# Patient Record
Sex: Male | Born: 1950
Health system: Southern US, Community
[De-identification: ages and names within clinical notes are randomized; demographics above are authoritative.]

## PROBLEM LIST (undated history)

## (undated) DIAGNOSIS — Z9889 Other specified postprocedural states: Secondary | ICD-10-CM

## (undated) DIAGNOSIS — F99 Mental disorder, not otherwise specified: Secondary | ICD-10-CM

## (undated) DIAGNOSIS — H409 Unspecified glaucoma: Secondary | ICD-10-CM

## (undated) DIAGNOSIS — N183 Chronic kidney disease, stage 3 unspecified: Secondary | ICD-10-CM

## (undated) DIAGNOSIS — Z8701 Personal history of pneumonia (recurrent): Secondary | ICD-10-CM

## (undated) DIAGNOSIS — I428 Other cardiomyopathies: Secondary | ICD-10-CM

## (undated) DIAGNOSIS — K219 Gastro-esophageal reflux disease without esophagitis: Secondary | ICD-10-CM

## (undated) DIAGNOSIS — Z9289 Personal history of other medical treatment: Secondary | ICD-10-CM

## (undated) DIAGNOSIS — M545 Low back pain, unspecified: Secondary | ICD-10-CM

## (undated) DIAGNOSIS — F32A Depression, unspecified: Secondary | ICD-10-CM

## (undated) DIAGNOSIS — G8929 Other chronic pain: Secondary | ICD-10-CM

## (undated) DIAGNOSIS — I10 Essential (primary) hypertension: Secondary | ICD-10-CM

## (undated) DIAGNOSIS — K922 Gastrointestinal hemorrhage, unspecified: Secondary | ICD-10-CM

## (undated) DIAGNOSIS — M1990 Unspecified osteoarthritis, unspecified site: Secondary | ICD-10-CM

## (undated) DIAGNOSIS — F1910 Other psychoactive substance abuse, uncomplicated: Secondary | ICD-10-CM

## (undated) DIAGNOSIS — F329 Major depressive disorder, single episode, unspecified: Secondary | ICD-10-CM

## (undated) DIAGNOSIS — I5042 Chronic combined systolic (congestive) and diastolic (congestive) heart failure: Secondary | ICD-10-CM

## (undated) DIAGNOSIS — I5023 Acute on chronic systolic (congestive) heart failure: Secondary | ICD-10-CM

## (undated) HISTORY — DX: Gastro-esophageal reflux disease without esophagitis: K21.9

## (undated) HISTORY — PX: FRACTURE SURGERY: SHX138

## (undated) HISTORY — DX: Essential (primary) hypertension: I10

## (undated) HISTORY — PX: GLAUCOMA SURGERY: SHX656

## (undated) HISTORY — PX: EYE SURGERY: SHX253

---

## 1997-03-13 DIAGNOSIS — Z9289 Personal history of other medical treatment: Secondary | ICD-10-CM

## 1997-03-13 HISTORY — PX: HIP FRACTURE SURGERY: SHX118

## 1997-03-13 HISTORY — DX: Personal history of other medical treatment: Z92.89

## 1997-03-13 HISTORY — PX: TIBIA FRACTURE SURGERY: SHX806

## 2004-11-09 ENCOUNTER — Ambulatory Visit (HOSPITAL_COMMUNITY): Admission: RE | Admit: 2004-11-09 | Discharge: 2004-11-09 | Payer: Self-pay | Admitting: Urology

## 2004-11-11 ENCOUNTER — Ambulatory Visit (HOSPITAL_COMMUNITY): Admission: RE | Admit: 2004-11-11 | Discharge: 2004-11-11 | Payer: Self-pay | Admitting: Urology

## 2009-03-13 HISTORY — PX: FEMUR FRACTURE SURGERY: SHX633

## 2010-03-13 DIAGNOSIS — H409 Unspecified glaucoma: Secondary | ICD-10-CM

## 2010-03-13 HISTORY — DX: Unspecified glaucoma: H40.9

## 2010-03-15 ENCOUNTER — Inpatient Hospital Stay (HOSPITAL_COMMUNITY): Admission: EM | Admit: 2010-03-15 | Discharge: 2010-03-22 | Payer: Self-pay | Source: Home / Self Care

## 2010-03-16 LAB — LIPID PANEL
Cholesterol: 138 mg/dL (ref 0–200)
HDL: 42 mg/dL (ref >39)
LDL Cholesterol: 71 mg/dL (ref 0–99)
Total CHOL/HDL Ratio: 3.3 RATIO
Triglycerides: 125 mg/dL (ref <150)
VLDL: 25 mg/dL (ref 0–40)

## 2010-03-16 LAB — COMPREHENSIVE METABOLIC PANEL
ALT: 59 U/L — ABNORMAL HIGH (ref 0–53)
AST: 87 U/L — ABNORMAL HIGH (ref 0–37)
Albumin: 3 g/dL — ABNORMAL LOW (ref 3.5–5.2)
Alkaline Phosphatase: 90 U/L (ref 39–117)
BUN: 9 mg/dL (ref 6–23)
CO2: 23 mEq/L (ref 19–32)
Calcium: 8.5 mg/dL (ref 8.4–10.5)
Chloride: 105 mEq/L (ref 96–112)
Creatinine, Ser: 1.52 mg/dL — ABNORMAL HIGH (ref 0.4–1.5)
GFR calc Af Amer: 57 mL/min — ABNORMAL LOW (ref >60)
GFR calc non Af Amer: 47 mL/min — ABNORMAL LOW (ref >60)
Glucose, Bld: 134 mg/dL — ABNORMAL HIGH (ref 70–99)
Potassium: 3.6 mEq/L (ref 3.5–5.1)
Sodium: 138 mEq/L (ref 135–145)
Total Bilirubin: 0.4 mg/dL (ref 0.3–1.2)
Total Protein: 5.7 g/dL — ABNORMAL LOW (ref 6.0–8.3)

## 2010-03-16 LAB — CARDIAC PANEL(CRET KIN+CKTOT+MB+TROPI)
CK, MB: 5.5 ng/mL — ABNORMAL HIGH (ref 0.3–4.0)
CK, MB: 5.4 ng/mL — ABNORMAL HIGH (ref 0.3–4.0)
Relative Index: 1.7 (ref 0.0–2.5)
Relative Index: 2 (ref 0.0–2.5)
Total CK: 330 U/L — ABNORMAL HIGH (ref 7–232)
Total CK: 273 U/L — ABNORMAL HIGH (ref 7–232)
Troponin I: 0.03 ng/mL (ref 0.00–0.06)
Troponin I: 0.04 ng/mL (ref 0.00–0.06)

## 2010-03-16 LAB — TSH: TSH: 3.129 u[IU]/mL (ref 0.350–4.500)

## 2010-03-16 LAB — BRAIN NATRIURETIC PEPTIDE: Pro B Natriuretic peptide (BNP): 1010 pg/mL — ABNORMAL HIGH (ref 0.0–100.0)

## 2010-03-17 ENCOUNTER — Other Ambulatory Visit: Payer: Self-pay | Admitting: Internal Medicine

## 2010-03-17 LAB — COMPREHENSIVE METABOLIC PANEL
ALT: 55 U/L — ABNORMAL HIGH (ref 0–53)
AST: 39 U/L — ABNORMAL HIGH (ref 0–37)
Albumin: 3.3 g/dL — ABNORMAL LOW (ref 3.5–5.2)
Alkaline Phosphatase: 88 U/L (ref 39–117)
BUN: 12 mg/dL (ref 6–23)
CO2: 27 mEq/L (ref 19–32)
Calcium: 9.3 mg/dL (ref 8.4–10.5)
Chloride: 103 mEq/L (ref 96–112)
Creatinine, Ser: 1.58 mg/dL — ABNORMAL HIGH (ref 0.4–1.5)
GFR calc Af Amer: 54 mL/min — ABNORMAL LOW (ref >60)
GFR calc non Af Amer: 45 mL/min — ABNORMAL LOW (ref >60)
Glucose, Bld: 83 mg/dL (ref 70–99)
Potassium: 4 mEq/L (ref 3.5–5.1)
Sodium: 139 mEq/L (ref 135–145)
Total Bilirubin: 0.5 mg/dL (ref 0.3–1.2)
Total Protein: 6 g/dL (ref 6.0–8.3)

## 2010-03-17 LAB — BRAIN NATRIURETIC PEPTIDE: Pro B Natriuretic peptide (BNP): 981 pg/mL — ABNORMAL HIGH (ref 0.0–100.0)

## 2010-03-18 LAB — BASIC METABOLIC PANEL
BUN: 18 mg/dL (ref 6–23)
CO2: 26 mEq/L (ref 19–32)
Calcium: 9.7 mg/dL (ref 8.4–10.5)
Chloride: 105 mEq/L (ref 96–112)
Creatinine, Ser: 1.47 mg/dL (ref 0.4–1.5)
GFR calc Af Amer: 59 mL/min — ABNORMAL LOW (ref >60)
GFR calc non Af Amer: 49 mL/min — ABNORMAL LOW (ref >60)
Glucose, Bld: 84 mg/dL (ref 70–99)
Potassium: 4 mEq/L (ref 3.5–5.1)
Sodium: 140 mEq/L (ref 135–145)

## 2010-03-18 LAB — CBC
HCT: 50.9 % (ref 39.0–52.0)
Hemoglobin: 17.6 g/dL — ABNORMAL HIGH (ref 13.0–17.0)
MCH: 32.1 pg (ref 26.0–34.0)
MCHC: 34.6 g/dL (ref 30.0–36.0)
MCV: 92.9 fL (ref 78.0–100.0)
Platelets: 220 10*3/uL (ref 150–400)
RBC: 5.48 MIL/uL (ref 4.22–5.81)
RDW: 14.9 % (ref 11.5–15.5)
WBC: 6.5 10*3/uL (ref 4.0–10.5)

## 2010-03-18 LAB — DIFFERENTIAL
Basophils Absolute: 0 10*3/uL (ref 0.0–0.1)
Basophils Relative: 1 % (ref 0–1)
Eosinophils Absolute: 0.2 10*3/uL (ref 0.0–0.7)
Eosinophils Relative: 3 % (ref 0–5)
Lymphocytes Relative: 32 % (ref 12–46)
Lymphs Abs: 2.1 10*3/uL (ref 0.7–4.0)
Monocytes Absolute: 0.7 10*3/uL (ref 0.1–1.0)
Monocytes Relative: 11 % (ref 3–12)
Neutro Abs: 3.4 10*3/uL (ref 1.7–7.7)
Neutrophils Relative %: 53 % (ref 43–77)

## 2010-03-18 LAB — BRAIN NATRIURETIC PEPTIDE: Pro B Natriuretic peptide (BNP): 653 pg/mL — ABNORMAL HIGH (ref 0.0–100.0)

## 2010-03-28 LAB — URINALYSIS, ROUTINE W REFLEX MICROSCOPIC
Bilirubin Urine: NEGATIVE
Hgb urine dipstick: NEGATIVE
Ketones, ur: NEGATIVE mg/dL
Nitrite: NEGATIVE
Protein, ur: NEGATIVE mg/dL
Specific Gravity, Urine: 1.02 (ref 1.005–1.030)
Urine Glucose, Fasting: NEGATIVE mg/dL
Urobilinogen, UA: 0.2 mg/dL (ref 0.0–1.0)
pH: 5 (ref 5.0–8.0)

## 2010-03-28 LAB — DIFFERENTIAL
Basophils Absolute: 0 10*3/uL (ref 0.0–0.1)
Basophils Absolute: 0 10*3/uL (ref 0.0–0.1)
Basophils Absolute: 0 10*3/uL (ref 0.0–0.1)
Basophils Relative: 0 % (ref 0–1)
Basophils Relative: 1 % (ref 0–1)
Basophils Relative: 1 % (ref 0–1)
Eosinophils Absolute: 0.2 10*3/uL (ref 0.0–0.7)
Eosinophils Absolute: 0.2 10*3/uL (ref 0.0–0.7)
Eosinophils Absolute: 0.2 10*3/uL (ref 0.0–0.7)
Eosinophils Relative: 3 % (ref 0–5)
Eosinophils Relative: 2 % (ref 0–5)
Eosinophils Relative: 3 % (ref 0–5)
Lymphocytes Relative: 35 % (ref 12–46)
Lymphocytes Relative: 36 % (ref 12–46)
Lymphocytes Relative: 37 % (ref 12–46)
Lymphs Abs: 2.3 10*3/uL (ref 0.7–4.0)
Lymphs Abs: 2.4 10*3/uL (ref 0.7–4.0)
Lymphs Abs: 2.2 10*3/uL (ref 0.7–4.0)
Monocytes Absolute: 0.6 10*3/uL (ref 0.1–1.0)
Monocytes Absolute: 0.8 10*3/uL (ref 0.1–1.0)
Monocytes Absolute: 0.9 10*3/uL (ref 0.1–1.0)
Monocytes Relative: 9 % (ref 3–12)
Monocytes Relative: 12 % (ref 3–12)
Monocytes Relative: 15 % — ABNORMAL HIGH (ref 3–12)
Neutro Abs: 3.5 10*3/uL (ref 1.7–7.7)
Neutro Abs: 3.2 10*3/uL (ref 1.7–7.7)
Neutro Abs: 2.6 10*3/uL (ref 1.7–7.7)
Neutrophils Relative %: 53 % (ref 43–77)
Neutrophils Relative %: 49 % (ref 43–77)
Neutrophils Relative %: 45 % (ref 43–77)

## 2010-03-28 LAB — COMPREHENSIVE METABOLIC PANEL
ALT: 73 U/L — ABNORMAL HIGH (ref 0–53)
AST: 61 U/L — ABNORMAL HIGH (ref 0–37)
Albumin: 3.3 g/dL — ABNORMAL LOW (ref 3.5–5.2)
Alkaline Phosphatase: 84 U/L (ref 39–117)
BUN: 25 mg/dL — ABNORMAL HIGH (ref 6–23)
CO2: 22 mEq/L (ref 19–32)
Calcium: 9.2 mg/dL (ref 8.4–10.5)
Chloride: 109 mEq/L (ref 96–112)
Creatinine, Ser: 1.35 mg/dL (ref 0.4–1.5)
GFR calc Af Amer: >60 mL/min (ref >60)
GFR calc non Af Amer: 54 mL/min — ABNORMAL LOW (ref >60)
Glucose, Bld: 97 mg/dL (ref 70–99)
Potassium: 4.2 mEq/L (ref 3.5–5.1)
Sodium: 138 mEq/L (ref 135–145)
Total Bilirubin: 0.2 mg/dL — ABNORMAL LOW (ref 0.3–1.2)
Total Protein: 6.3 g/dL (ref 6.0–8.3)

## 2010-03-28 LAB — CBC
HCT: 49 % (ref 39.0–52.0)
HCT: 49.7 % (ref 39.0–52.0)
HCT: 47.1 % (ref 39.0–52.0)
Hemoglobin: 16.4 g/dL (ref 13.0–17.0)
Hemoglobin: 16.9 g/dL (ref 13.0–17.0)
Hemoglobin: 15.7 g/dL (ref 13.0–17.0)
MCH: 31.5 pg (ref 26.0–34.0)
MCH: 31.8 pg (ref 26.0–34.0)
MCH: 31.4 pg (ref 26.0–34.0)
MCHC: 33.5 g/dL (ref 30.0–36.0)
MCHC: 34 g/dL (ref 30.0–36.0)
MCHC: 33.3 g/dL (ref 30.0–36.0)
MCV: 94.2 fL (ref 78.0–100.0)
MCV: 93.4 fL (ref 78.0–100.0)
MCV: 94.2 fL (ref 78.0–100.0)
Platelets: 202 10*3/uL (ref 150–400)
Platelets: 180 10*3/uL (ref 150–400)
Platelets: 192 10*3/uL (ref 150–400)
RBC: 5.2 MIL/uL (ref 4.22–5.81)
RBC: 5.32 MIL/uL (ref 4.22–5.81)
RBC: 5 MIL/uL (ref 4.22–5.81)
RDW: 14.7 % (ref 11.5–15.5)
RDW: 14.8 % (ref 11.5–15.5)
RDW: 14.8 % (ref 11.5–15.5)
WBC: 6.6 10*3/uL (ref 4.0–10.5)
WBC: 6.6 10*3/uL (ref 4.0–10.5)
WBC: 5.8 10*3/uL (ref 4.0–10.5)

## 2010-03-28 LAB — BASIC METABOLIC PANEL
BUN: 23 mg/dL (ref 6–23)
BUN: 31 mg/dL — ABNORMAL HIGH (ref 6–23)
BUN: 22 mg/dL (ref 6–23)
CO2: 28 mEq/L (ref 19–32)
CO2: 26 mEq/L (ref 19–32)
CO2: 25 mEq/L (ref 19–32)
Calcium: 9.9 mg/dL (ref 8.4–10.5)
Calcium: 9.6 mg/dL (ref 8.4–10.5)
Calcium: 8.7 mg/dL (ref 8.4–10.5)
Chloride: 97 mEq/L (ref 96–112)
Chloride: 104 mEq/L (ref 96–112)
Chloride: 107 mEq/L (ref 96–112)
Creatinine, Ser: 1.88 mg/dL — ABNORMAL HIGH (ref 0.4–1.5)
Creatinine, Ser: 1.93 mg/dL — ABNORMAL HIGH (ref 0.4–1.5)
Creatinine, Ser: 1.46 mg/dL (ref 0.4–1.5)
GFR calc Af Amer: 45 mL/min — ABNORMAL LOW (ref >60)
GFR calc Af Amer: 43 mL/min — ABNORMAL LOW (ref >60)
GFR calc Af Amer: 60 mL/min — ABNORMAL LOW (ref >60)
GFR calc non Af Amer: 37 mL/min — ABNORMAL LOW (ref >60)
GFR calc non Af Amer: 36 mL/min — ABNORMAL LOW (ref >60)
GFR calc non Af Amer: 49 mL/min — ABNORMAL LOW (ref >60)
Glucose, Bld: 98 mg/dL (ref 70–99)
Glucose, Bld: 115 mg/dL — ABNORMAL HIGH (ref 70–99)
Glucose, Bld: 87 mg/dL (ref 70–99)
Potassium: 4.3 mEq/L (ref 3.5–5.1)
Potassium: 4.4 mEq/L (ref 3.5–5.1)
Potassium: 4.2 mEq/L (ref 3.5–5.1)
Sodium: 135 mEq/L (ref 135–145)
Sodium: 138 mEq/L (ref 135–145)
Sodium: 137 mEq/L (ref 135–145)

## 2010-03-28 LAB — MAGNESIUM: Magnesium: 2.4 mg/dL (ref 1.5–2.5)

## 2010-03-28 LAB — URINE CULTURE
Colony Count: NO GROWTH
Culture  Setup Time: 201201082212
Culture: NO GROWTH
Special Requests: NEGATIVE

## 2010-03-28 LAB — CREATININE, URINE, RANDOM: Creatinine, Urine: 60.87 mg/dL

## 2010-03-28 LAB — BRAIN NATRIURETIC PEPTIDE
Pro B Natriuretic peptide (BNP): 352 pg/mL — ABNORMAL HIGH (ref 0.0–100.0)
Pro B Natriuretic peptide (BNP): 500 pg/mL — ABNORMAL HIGH (ref 0.0–100.0)

## 2010-03-28 LAB — SODIUM, URINE, RANDOM: Sodium, Ur: 71 mEq/L
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
## 2010-04-30 ENCOUNTER — Inpatient Hospital Stay (HOSPITAL_COMMUNITY)
Admission: EM | Admit: 2010-04-30 | Discharge: 2010-05-04 | DRG: 287 | Disposition: A | Discharge status: Home / Self Care | Payer: Medicare Other | Attending: Internal Medicine | Admitting: Internal Medicine

## 2010-04-30 ENCOUNTER — Emergency Department (HOSPITAL_COMMUNITY): Payer: Medicare Other

## 2010-04-30 ENCOUNTER — Encounter: Payer: Self-pay | Admitting: Cardiology

## 2010-04-30 DIAGNOSIS — F1910 Other psychoactive substance abuse, uncomplicated: Secondary | ICD-10-CM | POA: Diagnosis present

## 2010-04-30 DIAGNOSIS — I509 Heart failure, unspecified: Secondary | ICD-10-CM | POA: Diagnosis present

## 2010-04-30 DIAGNOSIS — I129 Hypertensive chronic kidney disease with stage 1 through stage 4 chronic kidney disease, or unspecified chronic kidney disease: Secondary | ICD-10-CM | POA: Diagnosis present

## 2010-04-30 DIAGNOSIS — N179 Acute kidney failure, unspecified: Secondary | ICD-10-CM | POA: Diagnosis present

## 2010-04-30 DIAGNOSIS — N189 Chronic kidney disease, unspecified: Secondary | ICD-10-CM | POA: Diagnosis present

## 2010-04-30 DIAGNOSIS — E876 Hypokalemia: Secondary | ICD-10-CM | POA: Diagnosis not present

## 2010-04-30 DIAGNOSIS — R7402 Elevation of levels of lactic acid dehydrogenase (LDH): Secondary | ICD-10-CM | POA: Diagnosis present

## 2010-04-30 DIAGNOSIS — R7401 Elevation of levels of liver transaminase levels: Secondary | ICD-10-CM | POA: Diagnosis present

## 2010-04-30 DIAGNOSIS — I5041 Acute combined systolic (congestive) and diastolic (congestive) heart failure: Principal | ICD-10-CM | POA: Diagnosis present

## 2010-04-30 DIAGNOSIS — I498 Other specified cardiac arrhythmias: Secondary | ICD-10-CM | POA: Diagnosis present

## 2010-04-30 DIAGNOSIS — Z9119 Patient's noncompliance with other medical treatment and regimen: Secondary | ICD-10-CM

## 2010-04-30 DIAGNOSIS — Z91199 Patient's noncompliance with other medical treatment and regimen due to unspecified reason: Secondary | ICD-10-CM

## 2010-04-30 DIAGNOSIS — I428 Other cardiomyopathies: Secondary | ICD-10-CM | POA: Diagnosis present

## 2010-04-30 DIAGNOSIS — F411 Generalized anxiety disorder: Secondary | ICD-10-CM | POA: Diagnosis present

## 2010-04-30 DIAGNOSIS — Z7982 Long term (current) use of aspirin: Secondary | ICD-10-CM

## 2010-04-30 LAB — DIFFERENTIAL
Basophils Absolute: 0 10*3/uL (ref 0.0–0.1)
Basophils Relative: 0 % (ref 0–1)
Eosinophils Absolute: 0.1 10*3/uL (ref 0.0–0.7)
Eosinophils Relative: 1 % (ref 0–5)
Lymphocytes Relative: 31 % (ref 12–46)
Lymphs Abs: 2.2 10*3/uL (ref 0.7–4.0)
Monocytes Absolute: 0.8 10*3/uL (ref 0.1–1.0)
Monocytes Relative: 12 % (ref 3–12)
Neutro Abs: 4 10*3/uL (ref 1.7–7.7)
Neutrophils Relative %: 56 % (ref 43–77)

## 2010-04-30 LAB — URINALYSIS, ROUTINE W REFLEX MICROSCOPIC
Bilirubin Urine: NEGATIVE
Ketones, ur: NEGATIVE mg/dL
Nitrite: POSITIVE — AB
Protein, ur: 30 mg/dL — AB
Specific Gravity, Urine: 1.025 (ref 1.005–1.030)
Urine Glucose, Fasting: NEGATIVE mg/dL
Urobilinogen, UA: 0.2 mg/dL (ref 0.0–1.0)
pH: 5.5 (ref 5.0–8.0)

## 2010-04-30 LAB — BASIC METABOLIC PANEL
BUN: 13 mg/dL (ref 6–23)
CO2: 23 mEq/L (ref 19–32)
Calcium: 9.2 mg/dL (ref 8.4–10.5)
Chloride: 108 mEq/L (ref 96–112)
Creatinine, Ser: 1.59 mg/dL — ABNORMAL HIGH (ref 0.4–1.5)
GFR calc Af Amer: 54 mL/min — ABNORMAL LOW (ref >60)
GFR calc non Af Amer: 45 mL/min — ABNORMAL LOW (ref >60)
Glucose, Bld: 96 mg/dL (ref 70–99)
Potassium: 3.9 mEq/L (ref 3.5–5.1)
Sodium: 139 mEq/L (ref 135–145)

## 2010-04-30 LAB — RAPID URINE DRUG SCREEN, HOSP PERFORMED
Amphetamines: NOT DETECTED
Barbiturates: NOT DETECTED
Benzodiazepines: NOT DETECTED
Cocaine: POSITIVE — AB
Opiates: POSITIVE — AB
Tetrahydrocannabinol: NOT DETECTED

## 2010-04-30 LAB — CBC
HCT: 49.3 % (ref 39.0–52.0)
Hemoglobin: 17.3 g/dL — ABNORMAL HIGH (ref 13.0–17.0)
MCH: 32.2 pg (ref 26.0–34.0)
MCHC: 35.1 g/dL (ref 30.0–36.0)
MCV: 91.8 fL (ref 78.0–100.0)
Platelets: 248 10*3/uL (ref 150–400)
RBC: 5.37 MIL/uL (ref 4.22–5.81)
RDW: 16.4 % — ABNORMAL HIGH (ref 11.5–15.5)
WBC: 7.2 10*3/uL (ref 4.0–10.5)

## 2010-04-30 LAB — D-DIMER, QUANTITATIVE: D-Dimer, Quant: 1.06 ug/mL-FEU — ABNORMAL HIGH (ref 0.00–0.48)

## 2010-04-30 LAB — CK TOTAL AND CKMB (NOT AT ARMC)
CK, MB: 6.3 ng/mL (ref 0.3–4.0)
Relative Index: 2.4 (ref 0.0–2.5)
Total CK: 261 U/L — ABNORMAL HIGH (ref 7–232)

## 2010-04-30 LAB — URINE MICROSCOPIC-ADD ON

## 2010-04-30 LAB — TROPONIN I: Troponin I: 0.06 ng/mL (ref 0.00–0.06)

## 2010-04-30 LAB — BRAIN NATRIURETIC PEPTIDE: Pro B Natriuretic peptide (BNP): 590 pg/mL — ABNORMAL HIGH (ref 0.0–100.0)

## 2010-04-30 MED ORDER — IOHEXOL 300 MG/ML  SOLN: 100.0000 mL | Freq: Once | INTRAMUSCULAR | Status: AC | PRN

## 2010-05-01 ENCOUNTER — Inpatient Hospital Stay (HOSPITAL_COMMUNITY): Payer: Medicare Other

## 2010-05-01 DIAGNOSIS — R079 Chest pain, unspecified: Secondary | ICD-10-CM

## 2010-05-01 DIAGNOSIS — R0602 Shortness of breath: Secondary | ICD-10-CM

## 2010-05-01 LAB — BASIC METABOLIC PANEL
CO2: 26 mEq/L (ref 19–32)
Calcium: 8.9 mg/dL (ref 8.4–10.5)
Creatinine, Ser: 1.57 mg/dL — ABNORMAL HIGH (ref 0.4–1.5)
GFR calc Af Amer: 55 mL/min — ABNORMAL LOW (ref >60)
GFR calc non Af Amer: 45 mL/min — ABNORMAL LOW (ref >60)

## 2010-05-01 LAB — DIFFERENTIAL
Basophils Absolute: 0 10*3/uL (ref 0.0–0.1)
Basophils Relative: 0 % (ref 0–1)
Eosinophils Absolute: 0.1 10*3/uL (ref 0.0–0.7)
Eosinophils Relative: 1 % (ref 0–5)
Monocytes Absolute: 0.7 10*3/uL (ref 0.1–1.0)
Monocytes Relative: 11 % (ref 3–12)

## 2010-05-01 LAB — CARDIAC PANEL(CRET KIN+CKTOT+MB+TROPI)
CK, MB: 5.2 ng/mL — ABNORMAL HIGH (ref 0.3–4.0)
Total CK: 203 U/L (ref 7–232)
Total CK: 185 U/L (ref 7–232)
Troponin I: 0.03 ng/mL (ref 0.00–0.06)
Troponin I: 0.04 ng/mL (ref 0.00–0.06)

## 2010-05-01 LAB — CK TOTAL AND CKMB (NOT AT ARMC)
CK, MB: 5.6 ng/mL — ABNORMAL HIGH (ref 0.3–4.0)
Relative Index: 2.2 (ref 0.0–2.5)
Total CK: 250 U/L — ABNORMAL HIGH (ref 7–232)

## 2010-05-01 LAB — GLUCOSE, CAPILLARY

## 2010-05-01 LAB — CBC
MCH: 30.8 pg (ref 26.0–34.0)
MCHC: 33.1 g/dL (ref 30.0–36.0)
Platelets: 194 10*3/uL (ref 150–400)
RDW: 16.2 % — ABNORMAL HIGH (ref 11.5–15.5)
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
## 2010-05-02 DIAGNOSIS — I5023 Acute on chronic systolic (congestive) heart failure: Secondary | ICD-10-CM

## 2010-05-02 DIAGNOSIS — R0602 Shortness of breath: Secondary | ICD-10-CM

## 2010-05-02 LAB — DIFFERENTIAL
Eosinophils Relative: 2 % (ref 0–5)
Lymphocytes Relative: 31 % (ref 12–46)
Lymphs Abs: 2.1 10*3/uL (ref 0.7–4.0)
Monocytes Relative: 11 % (ref 3–12)

## 2010-05-02 LAB — BASIC METABOLIC PANEL
BUN: 17 mg/dL (ref 6–23)
Chloride: 105 mEq/L (ref 96–112)
GFR calc non Af Amer: 45 mL/min — ABNORMAL LOW (ref >60)
Glucose, Bld: 97 mg/dL (ref 70–99)
Potassium: 3.7 mEq/L (ref 3.5–5.1)

## 2010-05-02 LAB — CBC
HCT: 47.1 % (ref 39.0–52.0)
MCV: 92.5 fL (ref 78.0–100.0)
RDW: 16.1 % — ABNORMAL HIGH (ref 11.5–15.5)
WBC: 6.7 10*3/uL (ref 4.0–10.5)

## 2010-05-03 DIAGNOSIS — I5023 Acute on chronic systolic (congestive) heart failure: Secondary | ICD-10-CM

## 2010-05-03 LAB — BASIC METABOLIC PANEL
BUN: 21 mg/dL (ref 6–23)
Calcium: 9 mg/dL (ref 8.4–10.5)
Creatinine, Ser: 1.71 mg/dL — ABNORMAL HIGH (ref 0.4–1.5)
GFR calc non Af Amer: 41 mL/min — ABNORMAL LOW (ref >60)
Glucose, Bld: 137 mg/dL — ABNORMAL HIGH (ref 70–99)

## 2010-05-03 LAB — HEPATIC FUNCTION PANEL
Bilirubin, Direct: 0.2 mg/dL (ref 0.0–0.3)
Indirect Bilirubin: 0.5 mg/dL (ref 0.3–0.9)

## 2010-05-03 LAB — URINE CULTURE
Colony Count: >=100000
Culture  Setup Time: 201202191121

## 2010-05-04 ENCOUNTER — Encounter: Payer: Self-pay | Admitting: Cardiovascular Disease

## 2010-05-04 DIAGNOSIS — I428 Other cardiomyopathies: Secondary | ICD-10-CM

## 2010-05-04 LAB — BASIC METABOLIC PANEL
BUN: 23 mg/dL (ref 6–23)
Chloride: 101 mEq/L (ref 96–112)
Creatinine, Ser: 1.67 mg/dL — ABNORMAL HIGH (ref 0.4–1.5)
GFR calc non Af Amer: 42 mL/min — ABNORMAL LOW (ref >60)

## 2010-05-04 LAB — APTT: aPTT: 31 seconds (ref 24–37)

## 2010-05-04 LAB — PROTIME-INR: INR: 1.01 (ref 0.00–1.49)

## 2010-05-04 LAB — BRAIN NATRIURETIC PEPTIDE: Pro B Natriuretic peptide (BNP): 591 pg/mL — ABNORMAL HIGH (ref 0.0–100.0)
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
## 2010-05-05 HISTORY — PX: CARDIAC CATHETERIZATION: SHX172

## 2010-05-05 LAB — POCT I-STAT 3, VENOUS BLOOD GAS (G3P V)
O2 Saturation: 46 %
pCO2, Ven: 46.5 mmHg (ref 45.0–50.0)

## 2010-05-05 LAB — POCT I-STAT 3, ART BLOOD GAS (G3+): Bicarbonate: 26.7 mEq/L — ABNORMAL HIGH (ref 20.0–24.0)

## 2010-05-06 ENCOUNTER — Inpatient Hospital Stay (HOSPITAL_COMMUNITY): Payer: Medicare Other

## 2010-05-06 ENCOUNTER — Inpatient Hospital Stay (HOSPITAL_COMMUNITY)
Admission: EM | Admit: 2010-05-06 | Discharge: 2010-05-09 | DRG: 292 | Disposition: A | Discharge status: Home / Self Care | Payer: Medicare Other | Attending: Internal Medicine | Admitting: Internal Medicine

## 2010-05-06 ENCOUNTER — Emergency Department (HOSPITAL_COMMUNITY): Payer: Medicare Other

## 2010-05-06 DIAGNOSIS — I509 Heart failure, unspecified: Secondary | ICD-10-CM | POA: Diagnosis present

## 2010-05-06 DIAGNOSIS — F17200 Nicotine dependence, unspecified, uncomplicated: Secondary | ICD-10-CM | POA: Diagnosis present

## 2010-05-06 DIAGNOSIS — N183 Chronic kidney disease, stage 3 unspecified: Secondary | ICD-10-CM | POA: Diagnosis present

## 2010-05-06 DIAGNOSIS — Z7982 Long term (current) use of aspirin: Secondary | ICD-10-CM

## 2010-05-06 DIAGNOSIS — Z8249 Family history of ischemic heart disease and other diseases of the circulatory system: Secondary | ICD-10-CM

## 2010-05-06 DIAGNOSIS — R0602 Shortness of breath: Secondary | ICD-10-CM

## 2010-05-06 DIAGNOSIS — E876 Hypokalemia: Secondary | ICD-10-CM | POA: Diagnosis present

## 2010-05-06 DIAGNOSIS — F1010 Alcohol abuse, uncomplicated: Secondary | ICD-10-CM | POA: Diagnosis present

## 2010-05-06 DIAGNOSIS — F1410 Cocaine abuse, uncomplicated: Secondary | ICD-10-CM | POA: Diagnosis present

## 2010-05-06 DIAGNOSIS — I428 Other cardiomyopathies: Secondary | ICD-10-CM | POA: Diagnosis present

## 2010-05-06 DIAGNOSIS — K219 Gastro-esophageal reflux disease without esophagitis: Secondary | ICD-10-CM | POA: Diagnosis present

## 2010-05-06 DIAGNOSIS — E785 Hyperlipidemia, unspecified: Secondary | ICD-10-CM | POA: Diagnosis present

## 2010-05-06 DIAGNOSIS — I129 Hypertensive chronic kidney disease with stage 1 through stage 4 chronic kidney disease, or unspecified chronic kidney disease: Secondary | ICD-10-CM | POA: Diagnosis present

## 2010-05-06 DIAGNOSIS — I5043 Acute on chronic combined systolic (congestive) and diastolic (congestive) heart failure: Principal | ICD-10-CM | POA: Diagnosis present

## 2010-05-06 LAB — POCT CARDIAC MARKERS

## 2010-05-06 LAB — COMPREHENSIVE METABOLIC PANEL
ALT: 44 U/L (ref 0–53)
AST: 48 U/L — ABNORMAL HIGH (ref 0–37)
Albumin: 3.4 g/dL — ABNORMAL LOW (ref 3.5–5.2)
Alkaline Phosphatase: 113 U/L (ref 39–117)
CO2: 26 mEq/L (ref 19–32)
Chloride: 107 mEq/L (ref 96–112)
Creatinine, Ser: 1.45 mg/dL (ref 0.4–1.5)
GFR calc Af Amer: >60 mL/min (ref >60)
GFR calc non Af Amer: 50 mL/min — ABNORMAL LOW (ref >60)
Potassium: 4.4 mEq/L (ref 3.5–5.1)
Sodium: 139 mEq/L (ref 135–145)
Total Bilirubin: 0.9 mg/dL (ref 0.3–1.2)

## 2010-05-06 LAB — CBC
Hemoglobin: 16.8 g/dL (ref 13.0–17.0)
MCH: 31.6 pg (ref 26.0–34.0)
MCHC: 34.4 g/dL (ref 30.0–36.0)
MCV: 91.9 fL (ref 78.0–100.0)

## 2010-05-06 LAB — RAPID URINE DRUG SCREEN, HOSP PERFORMED
Amphetamines: NOT DETECTED
Benzodiazepines: POSITIVE — AB
Tetrahydrocannabinol: NOT DETECTED

## 2010-05-06 LAB — DIFFERENTIAL
Basophils Relative: 1 % (ref 0–1)
Lymphs Abs: 1.5 10*3/uL (ref 0.7–4.0)
Monocytes Absolute: 0.9 10*3/uL (ref 0.1–1.0)
Monocytes Relative: 13 % — ABNORMAL HIGH (ref 3–12)
Neutro Abs: 4.4 10*3/uL (ref 1.7–7.7)

## 2010-05-06 LAB — MRSA PCR SCREENING: MRSA by PCR: NEGATIVE

## 2010-05-06 NOTE — Procedures (Signed)
  NAME:  DALTIN, CRIST NO.:  [id number]  MEDICAL RECORD NO.:  [id number]           PATIENT TYPE:  LOCATION:                                 FACILITY:  PHYSICIAN:  Verne Carrow, MDDATE OF BIRTH:  Apr 26, 1950  DATE OF PROCEDURE:  05/04/2010 DATE OF DISCHARGE:                           CARDIAC CATHETERIZATION   PRIMARY CARDIOLOGIST:  Gerrit Friends. Dietrich Pates, MD, [facility]  PROCEDURES PERFORMED: 1. Left heart catheterization. 2. Right heart catheterization. 3. Selective coronary angiography.  OPERATOR:  Verne Carrow, MD  INDICATION:  This is a 60 year old African American male with a history of cardiomyopathy.  Right and left heart catheterization was arranged for today to exclude obstructive coronary artery disease as the cause of his cardiomyopathy and also to measure his filling pressures.  DETAILS OF PROCEDURE:  The patient was brought to the main cardiac catheterization laboratory after signing informed consent for the procedure.  The right groin was prepped and draped in a sterile fashion. A 1% lidocaine was used for local anesthesia.  A 6-French sheath was inserted into the right femoral artery without difficulty.  A 7-French sheath was inserted into the right femoral vein without difficulty.  A right heart catheterization was performed with a balloon-tipped catheter.  Selective coronary angiography was performed with standard diagnostic catheters.  A pigtail catheter was used to measure left ventricular pressures.  The patient tolerated the procedure well and was taken to the holding area in stable condition.  HEMODYNAMIC FINDINGS:  Central aortic pressure 90/69.  Left ventricular pressure 89/18/27.  Right atrial pressure 15.  Right ventricular pressure 46/15/20.  Pulmonary artery pressure 37/28 with a mean of 32, pulmonary capillary wedge pressure mean 27.  Central aortic saturation 80%.  Pulmonary artery saturation 46%.  Cardiac output 3.0  liters per minute.  Cardiac index 1.49 liters per minute per meter squared.  ANGIOGRAPHIC FINDINGS:  The patient's left main artery bifurcates into the circumflex, a ramus intermedius branch, and the left anterior descending artery.  All these vessels were free of any obstructive disease.  The right coronary artery is a large dominant vessel with no disease.  No left ventricular angiogram was performed secondary to renal insufficiency.  A 50 mL of contrast dye was used for the case.  IMPRESSION: 1. Nonischemic cardiomyopathy. 2. No angiographic evidence of coronary artery disease. 3. Elevated filling pressures.  RECOMMENDATIONS:  We will resume diuresis, and we will continue medical management.  Further plans per the consult team and the primary team.     Verne Carrow, MD     CM/MEDQ  D:  05/04/2010  T:  05/05/2010  Job:  [id number]  cc:   Gerrit Friends. Dietrich Pates, MD, [facility]  Electronically Signed by Verne Carrow MD on 05/06/2010 01:12:22 PM

## 2010-05-07 DIAGNOSIS — I5023 Acute on chronic systolic (congestive) heart failure: Secondary | ICD-10-CM

## 2010-05-07 LAB — BASIC METABOLIC PANEL
CO2: 29 mEq/L (ref 19–32)
Calcium: 9 mg/dL (ref 8.4–10.5)
Chloride: 105 mEq/L (ref 96–112)
Creatinine, Ser: 1.49 mg/dL (ref 0.4–1.5)
Glucose, Bld: 96 mg/dL (ref 70–99)

## 2010-05-07 LAB — CARBOXYHEMOGLOBIN
O2 Saturation: 60.7 %
Total hemoglobin: 16.3 g/dL (ref 13.5–18.0)

## 2010-05-08 ENCOUNTER — Inpatient Hospital Stay (HOSPITAL_COMMUNITY): Payer: Medicare Other

## 2010-05-08 LAB — BASIC METABOLIC PANEL
BUN: 16 mg/dL (ref 6–23)
CO2: 27 mEq/L (ref 19–32)
Chloride: 100 mEq/L (ref 96–112)
Creatinine, Ser: 1.53 mg/dL — ABNORMAL HIGH (ref 0.4–1.5)
GFR calc Af Amer: 56 mL/min — ABNORMAL LOW (ref >60)
Potassium: 3.5 mEq/L (ref 3.5–5.1)

## 2010-05-08 LAB — CBC
Hemoglobin: 17 g/dL (ref 13.0–17.0)
MCH: 31 pg (ref 26.0–34.0)
MCV: 91.8 fL (ref 78.0–100.0)
Platelets: 195 10*3/uL (ref 150–400)
RBC: 5.48 MIL/uL (ref 4.22–5.81)
WBC: 6.9 10*3/uL (ref 4.0–10.5)

## 2010-05-09 DIAGNOSIS — I5043 Acute on chronic combined systolic (congestive) and diastolic (congestive) heart failure: Secondary | ICD-10-CM

## 2010-05-09 LAB — BASIC METABOLIC PANEL
CO2: 27 mEq/L (ref 19–32)
Calcium: 9.2 mg/dL (ref 8.4–10.5)
Creatinine, Ser: 1.53 mg/dL — ABNORMAL HIGH (ref 0.4–1.5)
GFR calc Af Amer: 56 mL/min — ABNORMAL LOW (ref >60)
GFR calc non Af Amer: 47 mL/min — ABNORMAL LOW (ref >60)
Sodium: 134 mEq/L — ABNORMAL LOW (ref 135–145)

## 2010-05-09 LAB — CARBOXYHEMOGLOBIN
Methemoglobin: 0.6 % (ref 0.0–1.5)
O2 Saturation: 67.9 %
Total hemoglobin: 17.5 g/dL (ref 13.5–18.0)

## 2010-05-10 LAB — ALDOSTERONE + RENIN ACTIVITY W/ RATIO
ALDO / PRA Ratio: 13.1 Ratio (ref 0.9–28.9)
Aldosterone: 13 ng/dL
PRA LC/MS/MS: 0.99 ng/mL/h (ref 0.25–5.82)

## 2010-05-16 DIAGNOSIS — I5023 Acute on chronic systolic (congestive) heart failure: Secondary | ICD-10-CM | POA: Insufficient documentation

## 2010-05-16 DIAGNOSIS — I10 Essential (primary) hypertension: Secondary | ICD-10-CM | POA: Insufficient documentation

## 2010-05-16 DIAGNOSIS — R0602 Shortness of breath: Secondary | ICD-10-CM | POA: Insufficient documentation

## 2010-05-16 HISTORY — DX: Acute on chronic systolic (congestive) heart failure: I50.23

## 2010-05-17 ENCOUNTER — Encounter: Payer: Medicare Other | Admitting: Internal Medicine

## 2010-05-19 ENCOUNTER — Encounter (INDEPENDENT_AMBULATORY_CARE_PROVIDER_SITE_OTHER): Payer: Self-pay | Admitting: *Deleted

## 2010-05-20 ENCOUNTER — Telehealth: Payer: Self-pay | Admitting: Internal Medicine

## 2010-05-21 NOTE — Discharge Summary (Signed)
NAME:  Ryan [name], Ryan [name] NO.:  [id number]  MEDICAL RECORD NO.:  [id number]           PATIENT TYPE:  I  LOCATION:  4714                         FACILITY:  [facility]  PHYSICIAN:  Richarda Overlie, MD       DATE OF BIRTH:  15-Mar-1950  DATE OF ADMISSION:  04/30/2010 DATE OF DISCHARGE:  05/04/2010                        DISCHARGE SUMMARY - REFERRING   PRIMARY CARE PHYSICIAN:  [facility].  PRIMARY CARDIOLOGIST:  Dr. Fouke Bing.  CHIEF COMPLAINT:  Shortness of breath.  DISCHARGE DIAGNOSES:  Acute-on-chronic systolic heart failure with severe biventricular heart failure, ejection fraction 15% to 20% by 2-D echo on January 2012, moderate-to-severe right ventricular dysfunction noncompliance, status post leaving AMA in January 2012, polysubstance abuse due to EtOH, tobacco, hypertension, renal insufficiency, status post cardiac catheterization that shows no evidence of any coronary artery disease with Dr. Clifton James.  CONSULTATIONS:  Dr. Sherryl Manges for ischemic cardiomyopathy.  SUBJECTIVE:  This is a 60 year old male with a history of cocaine and alcohol use who presents with severe biventricular heart failure.  He was recently evaluated in [place] on March 18, 2010, for an abnormal echocardiogram; and during that admission, he had left against medical advice.  The patient presented again on the 18th at [facility] with chest pain and shortness of breath.  He was also found to be mildly tachycardic with a blood pressure of 110/84.  He was treated with 4 baby aspirins, 4 mg of morphine, 2 mg of Ativan, sublingual nitro, and 400 mg of IV ciprofloxacin in the ED.  During this admission, the patient was found to have mild congestive heart failure.  A chest x- ray with a BNP of 590.  A D-dimer was drawn which was elevated prompting further testing.  HOSPITAL COURSE: 1. Shortness of breath.  The patient has as documented above evidence     of  biventricular failure because of an elevated D-dimer.  He had a     CT angio without any evidence of pulmonary embolism.  He did have a     partially loculated moderate right-sided pleural effusion.     However, he did not have an elevated white count and no documented     fevers at home.  The patient primarily was treated for his heart     failure, started on Coreg, lisinopril, Lasix.  This did cause his     creatinine to increase from 1.35 to 1.67.  His lisinopril and his     Lasix were held.  Also Cardiology was consulted and after extensive     discussion with the patient, it was decided to go ahead with a     cardiac cath.  His Lasix was held prior to the labs.  The patient     was also initiated on digoxin.  His cardiac cath did not show any     evidence of any obstructive coronary artery disease.  The patient     did have elevated filling pressures.  It was recommended to resume     his diuresis and continue medical management for the time being.  The patient's Lasix is being held.  He has been instructed to     restart the Lasix 40 mg p.o. twice a day on the 24th.  He will also     need a repeat BMET in about 5-7 days.  His lisinopril can be     reinitiated by Dr. Dietrich Pates once his creatinine has stabilized. 2. Polysubstance abuse.  The patient was counseled about cessation of     these recreational drugs as well as alcohol and the patient would     lead with social support and close outpatient GI follow-up for     possible elective rehab. 3. Renal failure.  Creatinine increased from 1.5 to 1.71.  His     lisinopril and Lasix were held.  He will have a repeat BMET in     about 5-7 days. 4. Loculated pleural effusion.  The patient did not have any evidence     of fever or white count.  At this point, I do not feel that the     patient's shortness of breath is related to this loculated     effusion.  However, if the patient does begin to spike a fever,     then this would need  to be evaluated further. 5. Right upper quadrant abdominal pain, likely secondary to congestive     hepatomegaly.  The patient's lipase and liver function were normal.     His abdominal KUB was also negative but showed some mild     constipation.  His abdominal pain resolved after he had a BM.  DISPOSITION:  The patient is being discharged on the following medications: 1. Aspirin 81 mg p.o. daily. 2. Coreg 3.125 mg p.o. twice daily. 3. Digoxin 0.125 mg p.o. daily. 4. Lasix 40 mg p.o. twice daily to be started on the 24. 5. Nitroglycerin 0.4 mg p.r.n. as needed for pain. 6. Protonix 40 mg p.o. daily.     Richarda Overlie, MD     NA/MEDQ  D:  05/04/2010  T:  05/04/2010  Job:  [id number]  cc:   Gerrit Friends. Dietrich Pates, MD, [facility]  Electronically Signed by Richarda Overlie MD on 05/21/2010 64:40:34 AM

## 2010-05-23 LAB — BASIC METABOLIC PANEL
BUN: 6 mg/dL (ref 6–23)
Calcium: 9.2 mg/dL (ref 8.4–10.5)
Creatinine, Ser: 1.33 mg/dL (ref 0.4–1.5)
GFR calc non Af Amer: 55 mL/min — ABNORMAL LOW (ref >60)
Glucose, Bld: 82 mg/dL (ref 70–99)

## 2010-05-23 LAB — HEPATIC FUNCTION PANEL
AST: 38 U/L — ABNORMAL HIGH (ref 0–37)
Bilirubin, Direct: 0.2 mg/dL (ref 0.0–0.3)
Total Protein: 6.7 g/dL (ref 6.0–8.3)

## 2010-05-23 LAB — POCT CARDIAC MARKERS
CKMB, poc: 7.6 ng/mL (ref 1.0–8.0)
Myoglobin, poc: 177 ng/mL (ref 12–200)

## 2010-05-23 LAB — RAPID URINE DRUG SCREEN, HOSP PERFORMED
Amphetamines: NOT DETECTED
Barbiturates: NOT DETECTED
Benzodiazepines: NOT DETECTED
Cocaine: NOT DETECTED
Opiates: POSITIVE — AB
Tetrahydrocannabinol: NOT DETECTED

## 2010-05-23 LAB — BLOOD GAS, ARTERIAL
Acid-base deficit: 0.6 mmol/L (ref 0.0–2.0)
O2 Content: 2 L/min
O2 Saturation: 98.7 %
Patient temperature: 37
pO2, Arterial: 127 mmHg — ABNORMAL HIGH (ref 80.0–100.0)

## 2010-05-23 LAB — CARDIAC PANEL(CRET KIN+CKTOT+MB+TROPI)
CK, MB: 7.2 ng/mL (ref 0.3–4.0)
Relative Index: 1.6 (ref 0.0–2.5)
Total CK: 455 U/L — ABNORMAL HIGH (ref 7–232)
Troponin I: 0.05 ng/mL (ref 0.00–0.06)

## 2010-05-23 LAB — CBC
HCT: 47.6 % (ref 39.0–52.0)
MCHC: 34.2 g/dL (ref 30.0–36.0)
Platelets: 220 10*3/uL (ref 150–400)
RDW: 15.3 % (ref 11.5–15.5)
WBC: 6.9 10*3/uL (ref 4.0–10.5)

## 2010-05-23 LAB — DIFFERENTIAL
Basophils Absolute: 0 10*3/uL (ref 0.0–0.1)
Basophils Relative: 0 % (ref 0–1)
Lymphocytes Relative: 22 % (ref 12–46)
Monocytes Absolute: 0.6 10*3/uL (ref 0.1–1.0)
Neutro Abs: 4.6 10*3/uL (ref 1.7–7.7)
Neutrophils Relative %: 68 % (ref 43–77)

## 2010-05-23 LAB — CK TOTAL AND CKMB (NOT AT ARMC)
CK, MB: 9.3 ng/mL (ref 0.3–4.0)
Relative Index: 1.6 (ref 0.0–2.5)
Total CK: 565 U/L — ABNORMAL HIGH (ref 7–232)

## 2010-05-23 LAB — TROPONIN I: Troponin I: 0.04 ng/mL (ref 0.00–0.06)

## 2010-05-24 NOTE — Letter (Signed)
Summary: Appointment - Missed  [facility], Main Office  [address]   [place], [place] [address]   Phone: [phone]  Fax: [phone]     May 19, 2010 MRN: [id number]   SHALIN VONBARGEN [address] [place], [place]  [address]   Dear Mr. Daphine Deutscher,  Our records indicate you missed your appointment on March 6,2012 with Dr.Bensimhon.  It is very important that we reach you to reschedule this appointment. We look forward to participating in your health care needs. Please contact us at the number listed above at your earliest convenience to reschedule this appointment.     Sincerely, Artist

## 2010-05-24 NOTE — Cardiovascular Report (Addendum)
Summary: Cardiac Cath   Cardiac Cath   Imported By: Earl Many 05/19/2010 19:01:38  _____________________________________________________________________  External Attachment:    Type:   Image     Comment:   External Document

## 2010-05-31 NOTE — Letter (Signed)
Summary: [facility] Discharge Summary - Referring  [facility] Discharge Summary - Referring   Imported By: Earl Many 05/20/2010 10:31:29  _____________________________________________________________________  External Attachment:    Type:   Image     Comment:   External Document

## 2010-05-31 NOTE — Progress Notes (Signed)
Summary: CALLING REGARDING PRESCRIPTION  Phone Note From Other Clinic Call back at [phone]   Caller: Patient Summary of Call: CALLING REGARDING PRESCRIPTIONS Initial call taken by: Judie Grieve,  May 20, 2010 10:42 AM Summary of Call: ok. when is his f/u? Dolores Patty, MD, [facility]  May 22, 2010 8:25 PM   Follow-up for Phone Call        Phone call completed, Provider notified:Patient lost RXs given to him upon discharge.  Per pharmacist at [facility] in [place], had just picked up Coreg 3.125mg ,Digoxin 0.125mg ,Lasix 40mg ,and Protonix 40mg  on 05/04/10.  Given new Rxs for Hydralazine50mg , Imdur30mg  ,Aldactone 25mg  and ASA 81mg . Follow-up by: Ledon Snare, RN,  May 20, 2010 11:47 AM
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
## 2010-06-09 NOTE — Discharge Summary (Signed)
  NAME:  LONZO, SAULTER NO.:  [id number]  MEDICAL RECORD NO.:  [id number]           PATIENT TYPE:  I  LOCATION:  2920                         FACILITY:  [facility]  PHYSICIAN:  Bevelyn Buckles. Bensimhon, MDDATE OF BIRTH:  1950/08/20  DATE OF ADMISSION:  05/06/2010 DATE OF DISCHARGE:  05/09/2010                              DISCHARGE SUMMARY   ADDENDUM  Mr. Carrick had problems with his PICC line and he was given TPA for this.  After the TPA was given, his CVP was checked.  On May 09, 2010, at noon, his CVP was 6.  It was rechecked at 12:24 and was 30. Additionally, after discussion with Dr. [name], Mr. Staller was given a prescription for Percocet 5 mg 1 tablet q.6 hours p.r.n. #20,0 no refills.  All other discharge medications and instructions remain the same.     Theodore Demark, PA-C   ______________________________ Bevelyn Buckles. Bensimhon, MD    RB/MEDQ  D:  05/09/2010  T:  05/10/2010  Job:  [id number]  Electronically Signed by Theodore Demark PA-C on 05/16/2010 06:55:33 AM Electronically Signed by Arvilla Meres MD on 06/09/2010 06:43:32 PM

## 2010-06-09 NOTE — Discharge Summary (Signed)
  NAME:  Ryan [name], Ryan [name] NO.:  [id number]  MEDICAL RECORD NO.:  [id number]           PATIENT TYPE:  I  LOCATION:  2920                         FACILITY:  [facility]  PHYSICIAN:  Bevelyn Buckles. Bensimhon, MDDATE OF BIRTH:  [birth year]  DATE OF ADMISSION:  05/06/2010 DATE OF DISCHARGE:  05/09/2010                              DISCHARGE SUMMARY   ADDENDUM:  Ryan [name] has chronic kidney disease, stage III.  Additionally, he has borderline hypotension with his systolic blood pressure frequently in the 90s.  The combination of chronic kidney disease stage III and borderline hypotension made it inadvisable to give him further blood pressure lowering medicines such as ACE inhibitors or ARB. Consideration can be given to adding one of these medications as an outpatient if he does well.  Therefore in the same statement, Ryan [name] is not currently on an ACE inhibitor or an ARB because of his hypotension and renal insufficiency.     Theodore Demark, PA-C   ______________________________ Bevelyn Buckles. Bensimhon, MD    RB/MEDQ  D:  05/27/2010  T:  05/28/2010  Job:  [id number]  Electronically Signed by Theodore Demark PA-C on 05/30/2010 06:30:28 AM Electronically Signed by Arvilla Meres MD on 06/09/2010 06:43:39 PM

## 2010-06-09 NOTE — Discharge Summary (Signed)
NAME:  EMILEO, SEMEL NO.:  [id number]  MEDICAL RECORD NO.:  [id number]           PATIENT TYPE:  I  LOCATION:  2920                         FACILITY:  [facility]  PHYSICIAN:  Bevelyn Buckles. Shelia Magallon, MDDATE OF BIRTH:  [birth year]  DATE OF ADMISSION:  05/06/2010 DATE OF DISCHARGE:  05/09/2010                              DISCHARGE SUMMARY   PROCEDURES:  Portable chest x-ray on admission and to follow up PICC line placement.  PRIMARY FINAL DISCHARGE DIAGNOSIS:  Severe biventricular systolic and diastolic heart failure with an EF of 15-20% by echocardiogram January 2012.  SECONDARY DIAGNOSES: 1. History of polysubstance abuse of alcohol, tobacco, and cocaine,     urine drug screen negative this admission. 2. Hypertension. 3. Gastroesophageal reflux disease. 4. Remote history of pancreatitis. 5. History of glaucoma. 6. Chronic kidney disease stage III with a BUN of 14, creatinine 1.53,     and GFR of 56 at discharge. 7. Family history of coronary artery disease in 1 sibling. 8. Status post cardiac catheterization May 04, 2010, showing no     coronary artery disease.  TIME AT DISCHARGE:  Forty-one minutes.  HOSPITAL COURSE:  Mr. Ingwersen is a 60 year old male with a history of nonischemic cardiomyopathy.  He came to the hospital for increased shortness of breath.  Dr. [name] evaluated him and felt that he had low output heart failure.  He was admitted for further evaluation and treatment.  A PICC line was placed in order to allow assessment of CPPs and for IV medications as well as blood draws.  His COax was followed closely as well.  His COax had been 46 at cath on May 04, 2010.  It was followed closely during his hospital stay and was 67.9% at discharge. His renal function was followed closely as well.  On admission, his BUN was 19 with a creatinine of 1.45.  With diuresis, it went up slightly with a BUN of 14, creatinine 1.53.  This will be followed  closely as an outpatient.  Milrinone was considered if his renal function got worse, but it stayed stable and was not needed.  In order to decrease his afterload, he was started on hydralazine and nitrates.  These were up- titrated as his pressure would allow, and he did well with these.  The hydralazine was given on a b.i.d. basis which Dr. [name] felt would increase compliance.  He required potassium supplementation because of the diuresis and this was given to him as well.  A urine drug screen was positive only for benzodiazepines and opiates.  Mr. Modica had received these medications during his stay.  His weight on admission was 82.4 kg and he was down to 80.2 kg on May 09, 2010.  Mr. Macha states that he has significant joint problems and was receiving Percocet for left knee and hip pain.  He states that he has been told he needs both the left knee and left hip replacement.  He is followed at the [facility] for these issues.  He is being given a short-term prescription for Percocet and is to follow up with the [place].  On May 09, 2010, Mr. Cuevas was ambulating better and his vital signs were improved.  Dr. [name] felt that Mr. Al was stable for discharge in improved condition, to follow up closely as an outpatient.  DISCHARGE INSTRUCTIONS:  His activity level to be increased gradually. He is encouraged to stick to a low-sodium, heart-healthy diet, and weigh himself daily.  He is to follow up with Dr. [name] on March 6 at 10:15.  [facility]  DISCHARGE MEDICATIONS: 1. Coreg 3.125 mg b.i.d. 2. Digoxin 0.125 mg daily. 3. Hydralazine 50 mg p.o. b.i.d. 4. Lasix 40 mg p.o. b.i.d. 5. Imdur 30 mg p.o. b.i.d. 6. Aldactone 25 mg one-half tab p.o. daily. 7. Sublingual nitroglycerin is discontinued. 8. Aspirin 81 mg a day. 9. Protonix 40 mg daily p.r.n.     Theodore Demark, PA-C   ______________________________ Bevelyn Buckles. Jeannie Mallinger, MD    RB/MEDQ  D:  05/09/2010  T:  05/10/2010  Job:  [id number]  cc:   [facility]  Electronically Signed by Theodore Demark PA-C on 05/16/2010 06:55:51 AM Electronically Signed by Arvilla Meres MD on 06/09/2010 06:43:35 PM

## 2010-06-27 ENCOUNTER — Encounter: Payer: Self-pay | Admitting: Internal Medicine

## 2010-07-07 ENCOUNTER — Encounter: Payer: Self-pay | Admitting: Internal Medicine

## 2010-07-08 ENCOUNTER — Other Ambulatory Visit: Payer: Self-pay | Admitting: Internal Medicine

## 2010-07-08 ENCOUNTER — Ambulatory Visit (INDEPENDENT_AMBULATORY_CARE_PROVIDER_SITE_OTHER): Payer: Medicare Other | Admitting: Internal Medicine

## 2010-07-08 ENCOUNTER — Encounter: Payer: Self-pay | Admitting: Internal Medicine

## 2010-07-08 VITALS — BP 104/82 | HR 104 | Resp 20 | Ht 71.0 in | Wt 203.8 lb

## 2010-07-08 DIAGNOSIS — I5023 Acute on chronic systolic (congestive) heart failure: Secondary | ICD-10-CM | POA: Insufficient documentation

## 2010-07-08 DIAGNOSIS — F17200 Nicotine dependence, unspecified, uncomplicated: Secondary | ICD-10-CM | POA: Insufficient documentation

## 2010-07-08 LAB — CBC WITH DIFFERENTIAL/PLATELET
Eosinophils Absolute: 0.3 10*3/uL (ref 0.0–0.7)
Eosinophils Relative: 3 % (ref 0–5)
Hemoglobin: 16.3 g/dL (ref 13.0–17.0)
Lymphocytes Relative: 35 % (ref 12–46)
Lymphs Abs: 2.9 10*3/uL (ref 0.7–4.0)
MCH: 31.7 pg (ref 26.0–34.0)
MCV: 92.6 fL (ref 78.0–100.0)
Monocytes Relative: 11 % (ref 3–12)
RBC: 5.15 MIL/uL (ref 4.22–5.81)
WBC: 8.3 10*3/uL (ref 4.0–10.5)

## 2010-07-08 LAB — BASIC METABOLIC PANEL
CO2: 25 mEq/L (ref 19–32)
Calcium: 9.5 mg/dL (ref 8.4–10.5)
Chloride: 104 mEq/L (ref 96–112)
Creat: 1.24 mg/dL (ref 0.40–1.50)
Glucose, Bld: 96 mg/dL (ref 70–99)
Sodium: 137 mEq/L (ref 135–145)

## 2010-07-08 MED ORDER — DIGOXIN 250 MCG PO TABS: 250.0000 ug | ORAL_TABLET | Freq: Every day | ORAL | Status: DC

## 2010-07-08 MED ORDER — POTASSIUM CHLORIDE CRYS ER 20 MEQ PO TBCR: 20.0000 meq | EXTENDED_RELEASE_TABLET | Freq: Every day | ORAL | Status: DC

## 2010-07-08 MED ORDER — FUROSEMIDE 40 MG PO TABS: 40.0000 mg | ORAL_TABLET | Freq: Two times a day (BID) | ORAL | Status: DC

## 2010-07-08 NOTE — Assessment & Plan Note (Signed)
Counseled on need to quit completely.  

## 2010-07-08 NOTE — Assessment & Plan Note (Addendum)
Currently with evidence of low-output HF (NYHA IIIB) with significant volume overload. I have suggested admission but he wants to defer at this time. Will start lasix 40 qd (can double up, if needed), kcl 20 qd and digoxin 0.25 qd. Long talk about situation and need to go to hospital if getting worse. Time spent 40 minutes.  F/u next week.

## 2010-07-08 NOTE — Progress Notes (Signed)
PCP: Bernette Redbird in [place]  HPI:  Ryan [name] is a 60 y/o male with CHF due to NICM EF 15-20%, CRI (Cr ~ 1.5), HTN and h/o poylsubstance use. Admitted in February with low output HF.  Echo EF 15-20% with severe biventricular dysfunction. Cath with normal cors.   RA 15 PA  37/28 (32) PCWP 27 Index 1.5 (pa sat 46%)  Diuresed and improved. Hydralazine and NTG added. Unable to tolerate ACE-I due to low BP and CRI. Here for f/u.   Says he is doing OK. Breathing better and says he can "walk twice as far as I used to." Gets SOB after about a 1/4 mile or goes up step. If goes to [place] says he has to use the buggy. No edema, orthopnea or PND. Reports compliance with medicines. Having problems with erectile dysfunction.   Weight up about 20 pounds since d/c.   Cut down to a few cigarettes per week.  ROS: All systems negative except as listed in HPI, PMH and Problem List.  Past Medical History  Diagnosis Date  . CHF (congestive heart failure)     combined  . Chronic kidney disease     stage III  . GERD (gastroesophageal reflux disease)   . HTN (hypertension)     Current Outpatient Prescriptions  Medication Sig Dispense Refill  . aspirin 81 MG EC tablet Take 81 mg by mouth daily.        . hydrALAZINE (APRESOLINE) 50 MG tablet Take 50 mg by mouth 2 (two) times daily.        . isosorbide mononitrate (IMDUR) 30 MG 24 hr tablet Take 30 mg by mouth 2 (two) times daily.        Marland Kitchen spironolactone (ALDACTONE) 25 MG tablet 1/2 tablet by mouth once daily          PHYSICAL EXAM: Filed Vitals:   07/08/10 1402  BP: 126/98  Pulse: 104  Resp: 20  General:  Tachypneic at rest.  HEENT: normal Neck: supple. JVP hard to see. Carotids 1+ bilaterally; no bruits. No lymphadenopathy or thryomegaly appreciated. Cor: PMI laterally displaced. Tachy regular +s3. Lungs: clear with decreased bs Abdomen: soft, nontender, + distended. No hepatosplenomegaly. No bruits or masses. Good bowel  sounds. Extremities: cool. no cyanosis, clubbing, rash. Tr edema Neuro: alert & orientedx3, cranial nerves grossly intact. Moves all 4 extremities w/o difficulty. Affect pleasant.    ECG: Sinus tachy 105 LVH. Non-specific TW flattening. + biatrial enlargement   ASSESSMENT & PLAN:

## 2010-07-08 NOTE — Patient Instructions (Signed)
Start Furosemide 40mg  daily Start Potassium daily Start Digoxin 0.25mg  daily Labs today Your physician recommends that you schedule a follow-up appointment in: 1 week with Dr [name] or Tereso Newcomer PA

## 2010-07-20 ENCOUNTER — Ambulatory Visit: Payer: Medicare Other | Admitting: Physician Assistant
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
## 2010-08-04 ENCOUNTER — Ambulatory Visit (INDEPENDENT_AMBULATORY_CARE_PROVIDER_SITE_OTHER): Payer: Medicare Other | Admitting: Physician Assistant

## 2010-08-04 ENCOUNTER — Encounter: Payer: Self-pay | Admitting: Physician Assistant

## 2010-08-04 VITALS — BP 124/80 | HR 88 | Resp 18 | Ht 71.0 in | Wt 200.8 lb

## 2010-08-04 DIAGNOSIS — I5022 Chronic systolic (congestive) heart failure: Secondary | ICD-10-CM

## 2010-08-04 LAB — BRAIN NATRIURETIC PEPTIDE: Pro B Natriuretic peptide (BNP): 171 pg/mL — ABNORMAL HIGH (ref 0.0–100.0)

## 2010-08-04 NOTE — Progress Notes (Signed)
History of Present Illness: Primary Cardiologist:  Dr. Arvilla Meres  Ryan [name] is a 60 y.o. male with CHF due to NICM EF 15-20%, CRI (Cr ~ 1.5), HTN and h/o poylsubstance use. Admitted in February with low output HF.  Echo EF 15-20% with severe biventricular dysfunction. Cath with normal cors.  Diuresed and improved.  Hydralazine and NTG added.  Unable to tolerate ACE-I due to low BP and CRI.  He saw Dr. [name] 4/27.  He was noted to be volume overloaded.  Lasix 40, K+ 20 and Lanoxin 0.25 were all added.  Admission was discussed but the patient deferred.  He returns for close follow up.  He feels that his dyspnea has improved.  He is not short of breath at rest in the office like he was previously.  He sleeps on 2 pillows.  He denies any significant PND.  He denies lower extremity edema.  His abdomen is somewhat distended.  He's been helping his son move.  By his description it sounds as though he is probably NYHA class IIb to 3.  He has an occasional chest pain.  This is atypical.  He denies syncope.  Past Medical History  Diagnosis Date  . CHF (congestive heart failure)     combined  . Chronic kidney disease     stage III  . GERD (gastroesophageal reflux disease)   . HTN (hypertension)     Current Outpatient Prescriptions  Medication Sig Dispense Refill  . aspirin 81 MG EC tablet Take 81 mg by mouth daily.        . carvedilol (COREG) 3.125 MG tablet Take 3.125 mg by mouth 2 (two) times daily with a meal.        . digoxin (LANOXIN) 0.25 MG tablet Take 1 tablet (250 mcg total) by mouth daily.  30 tablet  6  . furosemide (LASIX) 40 MG tablet Take 1 tablet (40 mg total) by mouth 2 (two) times daily.  60 tablet  6  . hydrALAZINE (APRESOLINE) 50 MG tablet Take 50 mg by mouth 2 (two) times daily.        . isosorbide mononitrate (IMDUR) 30 MG 24 hr tablet Take 30 mg by mouth 2 (two) times daily.        . potassium chloride SA (K-DUR,KLOR-CON) 20 MEQ tablet Take 1 tablet (20 mEq total)  by mouth daily.  30 tablet  6  . spironolactone (ALDACTONE) 25 MG tablet 1/2 tablet by mouth once daily         Allergies: No Known Allergies  Vital Signs: BP 124/80  Pulse 88  Resp 18  Ht 5\' 11"  (1.803 m)  Wt 200 lb 12.8 oz (91.082 kg)  BMI 28.01 kg/m2  PHYSICAL EXAM: Well nourished, well developed, in no acute distress HEENT: normal Neck: no JVD Cardiac:  normal S1, S2; RRR; no murmur, No S3 Lungs:  clear to auscultation bilaterally, no wheezing, rhonchi or rales Abd: soft, nontender, no hepatomegaly Ext: no edema Skin: warm and dry Neuro:  CNs 2-12 intact, no focal abnormalities noted  ASSESSMENT AND PLAN:

## 2010-08-04 NOTE — Patient Instructions (Signed)
Your physician recommends that you schedule a follow-up appointment in: 4 WEEKS WITH DR. [name] OR SCOTT WEAVER, PA-C DAY DR. [name] IS IN THE OFFICE.  Your physician recommends that you return for lab work in: TODAY BMET/BNP 428.22  A COPY OF YOUR ECHO; CATH REPORT; AND DISCHARGE SUMMARY HAVE BEEN FAXED TO [phone] [facility] TODAY.

## 2010-08-04 NOTE — Assessment & Plan Note (Signed)
Volume appears improved.  Weight is only down 3 pounds.  However, his breathing is improved.  I repeated his blood pressure and it was 94/78.  I will hold off on titrating his carvedilol.  Continue current dose of Lasix.  Check a basic metabolic and BNP today for follow up.  He can follow up with Dr. [name] or me on a day that Dr. [name] is here in 4 weeks.

## 2010-08-05 LAB — BASIC METABOLIC PANEL
BUN: 15 mg/dL (ref 6–23)
Chloride: 105 mEq/L (ref 96–112)
Creatinine, Ser: 1.4 mg/dL (ref 0.4–1.5)
Glucose, Bld: 97 mg/dL (ref 70–99)
Potassium: 3.9 mEq/L (ref 3.5–5.1)

## 2010-08-29 ENCOUNTER — Encounter: Payer: Self-pay | Admitting: Physician Assistant

## 2010-08-29 ENCOUNTER — Ambulatory Visit (INDEPENDENT_AMBULATORY_CARE_PROVIDER_SITE_OTHER): Payer: Medicare Other | Admitting: Physician Assistant

## 2010-08-29 VITALS — BP 120/68 | HR 88 | Ht 71.0 in | Wt 199.0 lb

## 2010-08-29 DIAGNOSIS — M549 Dorsalgia, unspecified: Secondary | ICD-10-CM

## 2010-08-29 DIAGNOSIS — M545 Low back pain, unspecified: Secondary | ICD-10-CM | POA: Insufficient documentation

## 2010-08-29 DIAGNOSIS — I5022 Chronic systolic (congestive) heart failure: Secondary | ICD-10-CM

## 2010-08-29 MED ORDER — CYCLOBENZAPRINE HCL 10 MG PO TABS: 10.0000 mg | ORAL_TABLET | Freq: Two times a day (BID) | ORAL | Status: AC | PRN

## 2010-08-29 NOTE — Patient Instructions (Addendum)
Change or hydralazine to 50 mg take one half tablet 3 times a day (every 8 hours). Change her carvedilol to 3.125 mg, take 2 tablets twice daily (every 12 hours). If you have difficulty remembering to take the hydralazine 3 times a day, change back to your current dose (hydralazine 50 mg twice daily and carvedilol 3.125 mg one tablet twice daily). If you do okay with remembering to take the hydralazine 3 times a day, call and we will change her prescription with her pharmacy. Start Flexeril 10 mg 1 tablet two times daily as needed for back pain  Your physician recommends that you schedule a follow-up appointment in: 6 WEEKS WITH DR. [name] AS PER SCOTT WEAVER, PA-C

## 2010-08-29 NOTE — Assessment & Plan Note (Addendum)
Volume stable.  I will try to adjust his medications further.  I would like for him to change his hydralazine to 50 mg one half tablet 3 times a day and increase his carvedilol to 6.25 mg twice a day.  He has been given instructions to call us if he has difficulty following this regimen with taking a medication 3 times a day.  If this is the case, he will go back to his current medications.  Follow up with Dr. [name] in 6 weeks.

## 2010-08-29 NOTE — Progress Notes (Signed)
History of Present Illness: Primary Cardiologist:  Dr. Arvilla Meres  Ryan [name] is a 60 y.o. male with CHF due to NICM EF 15-20%, CRI (Cr ~ 1.5), HTN and h/o poylsubstance use. Admitted in February with low output HF.  Echo EF 15-20% with severe biventricular dysfunction. Cath with normal cors.  Diuresed and improved.  Hydralazine and NTG added.  Unable to tolerate ACE-I due to low BP and CRI.  He saw Dr. [name] 4/27.  He was noted to be volume overloaded.  Lasix 40, K+ 20 and Lanoxin 0.25 were all added.  I saw him in follow up on 5/24.  He was improved at that time.  I did not titrate his medications any further given low blood pressures.  Follow up labs indicated a stable potassium of 3.9 and a stable creatinine at 1.4.  His BNP was 171 which was down from 235 on last assessment.  He returns today for follow up.  Overall, he is doing well.  His weight is down another pound.  He denies chest pain or shortness of breath.  He did a lot of work in the yard this past weekend, but denies any shortness of breath associated with this.  He denies orthopnea, PND or edema.  He denies syncope.  He complains of significant low back pain.  His left leg is shorter than his right and this seems to exacerbate his back pain.  He has some hydrocodone at home but has not taken any yet.  He does not have a primary care provider.  Past Medical History  Diagnosis Date  . CHF (congestive heart failure)     combined  . Chronic kidney disease     stage III  . GERD (gastroesophageal reflux disease)   . HTN (hypertension)     Current Outpatient Prescriptions  Medication Sig Dispense Refill  . aspirin 81 MG EC tablet Take 81 mg by mouth daily.        . carvedilol (COREG) 3.125 MG tablet Take 3.125 mg by mouth 2 (two) times daily with a meal.        . digoxin (LANOXIN) 0.25 MG tablet Take 1 tablet (250 mcg total) by mouth daily.  30 tablet  6  . furosemide (LASIX) 40 MG tablet Take 1 tablet (40 mg total) by  mouth 2 (two) times daily.  60 tablet  6  . hydrALAZINE (APRESOLINE) 50 MG tablet Take 50 mg by mouth 2 (two) times daily.        . hydrocodone-acetaminophen (LORCET-HD) 5-500 MG per capsule Take 1 capsule by mouth every 6 (six) hours as needed.        . isosorbide mononitrate (IMDUR) 30 MG 24 hr tablet Take 30 mg by mouth 2 (two) times daily.        . potassium chloride SA (K-DUR,KLOR-CON) 20 MEQ tablet Take 1 tablet (20 mEq total) by mouth daily.  30 tablet  6  . spironolactone (ALDACTONE) 25 MG tablet 1/2 tablet by mouth once daily         Allergies: No Known Allergies  ROS:  See history of present illness.  He denies loss of bowel or bladder function, he denies any radicular symptoms.  Vital Signs: BP 120/68  Pulse 88  Ht 5\' 11"  (1.803 m)  Wt 199 lb (90.266 kg)  BMI 27.75 kg/m2 Repeat blood pressure by me on the left 110/80  PHYSICAL EXAM: Well nourished, well developed, in no acute distress HEENT: normal Neck: no JVD Cardiac:  normal S1, S2; RRR; no murmur, No S3 Lungs:  clear to auscultation bilaterally, no wheezing, rhonchi or rales Abd: soft, nontender, no hepatomegaly Ext: no edema Musculoskeletal: Negative straight leg raise bilaterally Skin: warm and dry Neuro:  CNs 2-12 intact, no focal abnormalities noted  EKG:  Sinus rhythm, heart rate 88, left axis deviation, LVH, nonspecific ST-T wave changes, no significant change when compared to prior tracing  ASSESSMENT AND PLAN:

## 2010-08-29 NOTE — Assessment & Plan Note (Signed)
Mechanical low back pain.  He can take the hydrocodone that he has at home as needed.  He can take Tylenol instead of hydrocodone as needed.  I've given him a prescription for Flexeril 10 mg twice daily as needed #20 no refills.  He will get himself established with a primary care provider.  He can follow up further with his new provider.

## 2010-10-07 ENCOUNTER — Encounter: Payer: Self-pay | Admitting: Internal Medicine

## 2010-10-12 ENCOUNTER — Ambulatory Visit: Payer: Medicare Other | Admitting: Internal Medicine

## 2010-10-13 ENCOUNTER — Ambulatory Visit (INDEPENDENT_AMBULATORY_CARE_PROVIDER_SITE_OTHER): Payer: Medicare Other | Admitting: Internal Medicine

## 2010-10-13 ENCOUNTER — Encounter: Payer: Self-pay | Admitting: Internal Medicine

## 2010-10-13 VITALS — BP 142/90 | HR 102 | Resp 18 | Ht 71.0 in | Wt 199.4 lb

## 2010-10-13 DIAGNOSIS — I5022 Chronic systolic (congestive) heart failure: Secondary | ICD-10-CM

## 2010-10-13 MED ORDER — LISINOPRIL 5 MG PO TABS: 5.0000 mg | ORAL_TABLET | Freq: Two times a day (BID) | ORAL | Status: DC

## 2010-10-13 NOTE — Assessment & Plan Note (Signed)
NYHA 3. Volume status looks good. Given his tachycardia and symptoms I worry he has a component of low output. Will repeat echo and proceed with CPX (if he can do it with leg injury) to more clearly evaluate functional capacity and help with prognostication. Long discussion about need for complete abstinence from ETOH, tobacco and any drugs in case of need for advanced therapies. Add lisinopril 5 bid. Check BMET next week. Plan to see back in HF clinic soon and titrate coreg.

## 2010-10-13 NOTE — Patient Instructions (Signed)
Increase Lisinopril to 5 mg Twice daily   Labs in 1 week in [place]  Your physician has requested that you have an echocardiogram. Echocardiography is a painless test that uses sound waves to create images of your heart. It provides your doctor with information about the size and shape of your heart and how well your heart's chambers and valves are working. This procedure takes approximately one hour. There are no restrictions for this procedure.  Your physician has recommended that you have a cardiopulmonary stress test (CPX). CPX testing is a non-invasive measurement of heart and lung function. It replaces a traditional treadmill stress test. This type of test provides a tremendous amount of information that relates not only to your present condition but also for future outcomes. This test combines measurements of you ventilation, respiratory gas exchange in the lungs, electrocardiogram (EKG), blood pressure and physical response before, during, and following an exercise protocol.  Your physician recommends that you schedule a follow-up appointment in: 1 month in [facility] Failure [facility]
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
## 2010-10-19 ENCOUNTER — Ambulatory Visit (HOSPITAL_COMMUNITY): Payer: Medicare Other

## 2010-10-19 ENCOUNTER — Ambulatory Visit (HOSPITAL_COMMUNITY): Payer: Medicare Other | Attending: Internal Medicine

## 2010-10-19 ENCOUNTER — Other Ambulatory Visit (HOSPITAL_COMMUNITY): Payer: Medicare Other | Admitting: Radiology

## 2010-10-26 ENCOUNTER — Ambulatory Visit (HOSPITAL_COMMUNITY): Payer: Medicare Other | Attending: Internal Medicine | Admitting: Radiology

## 2010-10-26 DIAGNOSIS — K219 Gastro-esophageal reflux disease without esophagitis: Secondary | ICD-10-CM | POA: Insufficient documentation

## 2010-10-26 DIAGNOSIS — I5022 Chronic systolic (congestive) heart failure: Secondary | ICD-10-CM

## 2010-10-26 DIAGNOSIS — I509 Heart failure, unspecified: Secondary | ICD-10-CM | POA: Insufficient documentation

## 2010-10-26 DIAGNOSIS — N189 Chronic kidney disease, unspecified: Secondary | ICD-10-CM | POA: Insufficient documentation

## 2010-10-26 DIAGNOSIS — I129 Hypertensive chronic kidney disease with stage 1 through stage 4 chronic kidney disease, or unspecified chronic kidney disease: Secondary | ICD-10-CM | POA: Insufficient documentation

## 2010-11-15 ENCOUNTER — Ambulatory Visit (HOSPITAL_COMMUNITY): Payer: Medicare Other

## 2010-12-01 ENCOUNTER — Ambulatory Visit (HOSPITAL_COMMUNITY)
Admission: RE | Admit: 2010-12-01 | Discharge: 2010-12-01 | Disposition: A | Discharge status: Home / Self Care | Payer: Medicare Other | Source: Ambulatory Visit | Attending: Internal Medicine | Admitting: Internal Medicine

## 2010-12-01 ENCOUNTER — Encounter (HOSPITAL_COMMUNITY): Payer: Self-pay

## 2010-12-01 VITALS — BP 118/80 | HR 85 | Wt 204.0 lb

## 2010-12-01 DIAGNOSIS — I5022 Chronic systolic (congestive) heart failure: Secondary | ICD-10-CM | POA: Insufficient documentation

## 2010-12-01 MED ORDER — LISINOPRIL 5 MG PO TABS: 10.0000 mg | ORAL_TABLET | Freq: Two times a day (BID) | ORAL | Status: DC

## 2010-12-01 NOTE — Patient Instructions (Signed)
Take Lisinopril 10mg   By mouth twice daily  Follow up one month.
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
## 2010-12-01 NOTE — Assessment & Plan Note (Addendum)
EF remains markedly depressed. Stable NYHA III. Volume ok. Will increase lisinopril to 10 bid. Check BMET and BNP next week. Reinforced need to check daily weights and use sliding scale diuretic. Stressed need to avoid ETOH completely.

## 2011-01-05 ENCOUNTER — Ambulatory Visit (HOSPITAL_COMMUNITY)
Admission: RE | Admit: 2011-01-05 | Discharge: 2011-01-05 | Disposition: A | Discharge status: Home / Self Care | Payer: Medicare Other | Source: Ambulatory Visit | Attending: Internal Medicine | Admitting: Internal Medicine

## 2011-01-05 VITALS — BP 90/54 | HR 50 | Wt 208.0 lb

## 2011-01-05 DIAGNOSIS — I5022 Chronic systolic (congestive) heart failure: Secondary | ICD-10-CM | POA: Insufficient documentation

## 2011-01-05 LAB — BASIC METABOLIC PANEL
BUN: 20 mg/dL (ref 6–23)
CO2: 26 mEq/L (ref 19–32)
Calcium: 10.4 mg/dL (ref 8.4–10.5)
Chloride: 104 mEq/L (ref 96–112)
Creatinine, Ser: 1.53 mg/dL — ABNORMAL HIGH (ref 0.50–1.35)

## 2011-01-05 MED ORDER — CARVEDILOL 3.125 MG PO TABS: ORAL_TABLET | ORAL | Status: DC

## 2011-01-05 NOTE — Assessment & Plan Note (Addendum)
Stable NYHA III. Volume ok. Compliant with medications. SBP 90s . Will increase carvedilol 6.25 mg at night.  Reinforced low salt diet and fluid restriction.   Re-educated to stop drinking alchol and stop smoking. Discussed the fact that he may require LVAD vs heart transplant in future due to weak heart.  Recheck BMET today. Follow up in two weeks.   Patient seen and examined with Tonye Becket, NP. We discussed all aspects of the encounter. I agree with the assessment and plan as stated above.
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
## 2011-01-05 NOTE — Patient Instructions (Addendum)
Continue to weigh and record daily  Take carvedilol 3.125 mg in am and 6.25 in pm  Follow up in 2 weeks

## 2011-01-09 NOTE — Progress Notes (Signed)
Patient seen and examined with Amy Clegg, NP. We discussed all aspects of the encounter. I agree with the assessment and plan as stated above.   

## 2011-01-17 ENCOUNTER — Ambulatory Visit (HOSPITAL_COMMUNITY): Payer: Medicare Other | Attending: Internal Medicine

## 2011-01-27 ENCOUNTER — Ambulatory Visit (HOSPITAL_COMMUNITY)
Admission: RE | Admit: 2011-01-27 | Discharge: 2011-01-27 | Disposition: A | Discharge status: Home / Self Care | Payer: Medicare Other | Source: Ambulatory Visit | Attending: Internal Medicine | Admitting: Internal Medicine

## 2011-01-27 ENCOUNTER — Encounter (HOSPITAL_COMMUNITY): Payer: Self-pay

## 2011-01-27 VITALS — BP 112/84 | HR 92 | Wt 204.8 lb

## 2011-01-27 DIAGNOSIS — I5022 Chronic systolic (congestive) heart failure: Secondary | ICD-10-CM

## 2011-01-27 LAB — BASIC METABOLIC PANEL
BUN: 16 mg/dL (ref 6–23)
CO2: 26 mEq/L (ref 19–32)
Calcium: 9.5 mg/dL (ref 8.4–10.5)
Creatinine, Ser: 1.2 mg/dL (ref 0.50–1.35)
GFR calc non Af Amer: 64 mL/min — ABNORMAL LOW (ref >90)
Glucose, Bld: 102 mg/dL — ABNORMAL HIGH (ref 70–99)

## 2011-01-27 MED ORDER — CARVEDILOL 6.25 MG PO TABS: 6.2500 mg | ORAL_TABLET | Freq: Two times a day (BID) | ORAL | Status: DC
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
## 2011-01-27 NOTE — Assessment & Plan Note (Signed)
Doing fairly well. Stable NYHA III. Will increase carvedilol to 6.25 bid. Needs labs rechecked today due to hyperkalemia. Will stop kcl. Long talk about need for daily weights and sliding scale lasix. Told to take extra lasix if weight over 206.   Total time spent = 30 mins with over 50% of that time dedicated to counseling and discussions described above.

## 2011-01-27 NOTE — Patient Instructions (Signed)
Stop Potassium Increase Carvedilol to 6.25 mg Twice daily  Increase AM Lasix to 80 mg if weight over 206 lb  Lab today  Your physician recommends that you schedule a follow-up appointment in: 1 month

## 2011-03-02 ENCOUNTER — Encounter (HOSPITAL_COMMUNITY): Payer: Self-pay

## 2011-03-02 ENCOUNTER — Ambulatory Visit (HOSPITAL_COMMUNITY)
Admission: RE | Admit: 2011-03-02 | Discharge: 2011-03-02 | Disposition: A | Discharge status: Home / Self Care | Payer: Medicare Other | Source: Ambulatory Visit | Attending: Internal Medicine | Admitting: Internal Medicine

## 2011-03-02 VITALS — BP 110/68 | HR 85 | Wt 205.8 lb

## 2011-03-02 DIAGNOSIS — I5022 Chronic systolic (congestive) heart failure: Secondary | ICD-10-CM | POA: Insufficient documentation

## 2011-03-02 DIAGNOSIS — F17200 Nicotine dependence, unspecified, uncomplicated: Secondary | ICD-10-CM

## 2011-03-02 MED ORDER — CARVEDILOL 6.25 MG PO TABS: 9.3750 mg | ORAL_TABLET | Freq: Two times a day (BID) | ORAL | Status: DC

## 2011-03-02 NOTE — Patient Instructions (Addendum)
Increase carvedilol 9.375 mg (1.5 tabs) twice daily.    Continue to weigh yourself daily and take an extra lasix if weight above 206 lbs.   Follow up in 1 month with echo.
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
## 2011-03-12 NOTE — Assessment & Plan Note (Signed)
Counseled on need to stop smoking completely. 

## 2011-03-12 NOTE — Assessment & Plan Note (Signed)
Doing well. NYHA II-III. Volume status looks good. Reinforced need for daily weights and reviewed use of sliding scale diuretics. Titrate carvedilol to 9.375 bid.

## 2011-03-14 DIAGNOSIS — Z8701 Personal history of pneumonia (recurrent): Secondary | ICD-10-CM

## 2011-03-14 HISTORY — DX: Personal history of pneumonia (recurrent): Z87.01

## 2011-03-29 ENCOUNTER — Ambulatory Visit (HOSPITAL_COMMUNITY): Payer: Medicare Other

## 2011-04-03 ENCOUNTER — Encounter (HOSPITAL_COMMUNITY): Payer: Medicare Other

## 2011-04-06 ENCOUNTER — Other Ambulatory Visit (HOSPITAL_COMMUNITY): Payer: Medicare Other

## 2011-04-06 ENCOUNTER — Inpatient Hospital Stay (HOSPITAL_COMMUNITY): Admission: RE | Admit: 2011-04-06 | Payer: Medicare Other | Source: Ambulatory Visit

## 2011-04-26 ENCOUNTER — Ambulatory Visit (HOSPITAL_COMMUNITY): Payer: Medicare Other

## 2011-04-26 ENCOUNTER — Other Ambulatory Visit: Payer: Self-pay | Admitting: Internal Medicine

## 2011-04-26 DIAGNOSIS — I5022 Chronic systolic (congestive) heart failure: Secondary | ICD-10-CM

## 2011-05-03 ENCOUNTER — Other Ambulatory Visit: Payer: Self-pay | Admitting: *Deleted

## 2011-05-03 DIAGNOSIS — I5022 Chronic systolic (congestive) heart failure: Secondary | ICD-10-CM

## 2011-05-04 ENCOUNTER — Other Ambulatory Visit: Payer: Self-pay

## 2011-05-04 ENCOUNTER — Other Ambulatory Visit (INDEPENDENT_AMBULATORY_CARE_PROVIDER_SITE_OTHER): Payer: Medicare Other

## 2011-05-04 DIAGNOSIS — I5022 Chronic systolic (congestive) heart failure: Secondary | ICD-10-CM

## 2011-05-04 DIAGNOSIS — I428 Other cardiomyopathies: Secondary | ICD-10-CM

## 2011-05-09 ENCOUNTER — Telehealth (HOSPITAL_COMMUNITY): Payer: Self-pay | Admitting: Adult Health

## 2011-05-09 NOTE — Telephone Encounter (Signed)
Patients sister aware of ECHO results.

## 2011-05-11 ENCOUNTER — Ambulatory Visit (HOSPITAL_COMMUNITY)
Admission: RE | Admit: 2011-05-11 | Discharge: 2011-05-11 | Disposition: A | Discharge status: Home / Self Care | Payer: Medicare Other | Source: Ambulatory Visit | Attending: Internal Medicine | Admitting: Internal Medicine

## 2011-05-11 ENCOUNTER — Encounter (HOSPITAL_COMMUNITY): Payer: Self-pay

## 2011-05-11 VITALS — BP 114/72 | HR 102 | Wt 202.5 lb

## 2011-05-11 DIAGNOSIS — I428 Other cardiomyopathies: Secondary | ICD-10-CM | POA: Insufficient documentation

## 2011-05-11 DIAGNOSIS — F1910 Other psychoactive substance abuse, uncomplicated: Secondary | ICD-10-CM | POA: Insufficient documentation

## 2011-05-11 DIAGNOSIS — I129 Hypertensive chronic kidney disease with stage 1 through stage 4 chronic kidney disease, or unspecified chronic kidney disease: Secondary | ICD-10-CM | POA: Insufficient documentation

## 2011-05-11 DIAGNOSIS — I5022 Chronic systolic (congestive) heart failure: Secondary | ICD-10-CM

## 2011-05-11 DIAGNOSIS — F17200 Nicotine dependence, unspecified, uncomplicated: Secondary | ICD-10-CM | POA: Insufficient documentation

## 2011-05-11 DIAGNOSIS — K219 Gastro-esophageal reflux disease without esophagitis: Secondary | ICD-10-CM | POA: Insufficient documentation

## 2011-05-11 DIAGNOSIS — N189 Chronic kidney disease, unspecified: Secondary | ICD-10-CM | POA: Insufficient documentation

## 2011-05-11 DIAGNOSIS — I509 Heart failure, unspecified: Secondary | ICD-10-CM | POA: Insufficient documentation

## 2011-05-11 DIAGNOSIS — Z7982 Long term (current) use of aspirin: Secondary | ICD-10-CM | POA: Insufficient documentation

## 2011-05-11 MED ORDER — CARVEDILOL 6.25 MG PO TABS: 12.5000 mg | ORAL_TABLET | Freq: Two times a day (BID) | ORAL | Status: DC

## 2011-05-11 NOTE — Assessment & Plan Note (Signed)
Counseled on need to stop smoking completely. 

## 2011-05-11 NOTE — Assessment & Plan Note (Addendum)
His volume status looks good and he feels good.  NYHA II-III.  Reinforced need for daily weights and reviewed use of sliding scale diuretics. Titrate carvedilol to 12.5 bid. EF getting better slowly. Will hold off on ICD for now and see if EF > 35% on next echo. If so can reconsider ICD. He is in agreement.

## 2011-05-11 NOTE — Patient Instructions (Addendum)
Increase coreg to 2 tabs (12.5 mg) twice daily.  Follow up 3 months.
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
## 2011-07-11 ENCOUNTER — Encounter (HOSPITAL_COMMUNITY): Payer: Self-pay

## 2011-07-11 ENCOUNTER — Ambulatory Visit (HOSPITAL_COMMUNITY)
Admission: RE | Admit: 2011-07-11 | Discharge: 2011-07-11 | Disposition: A | Discharge status: Home / Self Care | Payer: Medicare Other | Source: Ambulatory Visit | Attending: Internal Medicine | Admitting: Internal Medicine

## 2011-07-11 VITALS — BP 136/82 | HR 85 | Wt 205.5 lb

## 2011-07-11 DIAGNOSIS — I5022 Chronic systolic (congestive) heart failure: Secondary | ICD-10-CM

## 2011-07-11 DIAGNOSIS — F17200 Nicotine dependence, unspecified, uncomplicated: Secondary | ICD-10-CM | POA: Insufficient documentation

## 2011-07-11 MED ORDER — CARVEDILOL 12.5 MG PO TABS: 18.7500 mg | ORAL_TABLET | Freq: Two times a day (BID) | ORAL | Status: DC
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
## 2011-07-11 NOTE — Assessment & Plan Note (Signed)
NYHA II. Volume status stable. Will increase Carvedilol 18.75 BID. Discussed possible fatigue and edema associated with Carvedilol increase. Reinforced daily weights, low salt diet and medication compliance. Follow up in one month.

## 2011-07-11 NOTE — Patient Instructions (Signed)
Increase Carvedilol to 18.75 mg (1 & 1/2 tabs) Twice daily   Your physician recommends that you schedule a follow-up appointment in: 1 month  

## 2011-07-11 NOTE — Assessment & Plan Note (Signed)
Discussed tobacco cessation however he declines.

## 2011-08-09 ENCOUNTER — Ambulatory Visit (HOSPITAL_COMMUNITY): Payer: Medicare Other | Attending: Internal Medicine

## 2011-10-31 ENCOUNTER — Encounter (HOSPITAL_COMMUNITY): Payer: Medicare Other

## 2012-08-27 ENCOUNTER — Encounter (HOSPITAL_COMMUNITY): Payer: Self-pay

## 2012-08-27 ENCOUNTER — Ambulatory Visit (HOSPITAL_COMMUNITY)
Admission: RE | Admit: 2012-08-27 | Discharge: 2012-08-27 | Disposition: A | Discharge status: Home / Self Care | Payer: Medicare Other | Source: Ambulatory Visit | Attending: Internal Medicine | Admitting: Internal Medicine

## 2012-08-27 VITALS — BP 160/100 | HR 77 | Wt 209.0 lb

## 2012-08-27 DIAGNOSIS — I129 Hypertensive chronic kidney disease with stage 1 through stage 4 chronic kidney disease, or unspecified chronic kidney disease: Secondary | ICD-10-CM | POA: Insufficient documentation

## 2012-08-27 DIAGNOSIS — I5022 Chronic systolic (congestive) heart failure: Secondary | ICD-10-CM

## 2012-08-27 DIAGNOSIS — R0989 Other specified symptoms and signs involving the circulatory and respiratory systems: Secondary | ICD-10-CM | POA: Insufficient documentation

## 2012-08-27 DIAGNOSIS — Z7982 Long term (current) use of aspirin: Secondary | ICD-10-CM | POA: Insufficient documentation

## 2012-08-27 DIAGNOSIS — R0601 Orthopnea: Secondary | ICD-10-CM | POA: Insufficient documentation

## 2012-08-27 DIAGNOSIS — I4891 Unspecified atrial fibrillation: Secondary | ICD-10-CM | POA: Insufficient documentation

## 2012-08-27 DIAGNOSIS — Z79899 Other long term (current) drug therapy: Secondary | ICD-10-CM | POA: Insufficient documentation

## 2012-08-27 DIAGNOSIS — K219 Gastro-esophageal reflux disease without esophagitis: Secondary | ICD-10-CM | POA: Insufficient documentation

## 2012-08-27 DIAGNOSIS — N183 Chronic kidney disease, stage 3 unspecified: Secondary | ICD-10-CM | POA: Insufficient documentation

## 2012-08-27 DIAGNOSIS — I10 Essential (primary) hypertension: Secondary | ICD-10-CM

## 2012-08-27 DIAGNOSIS — R0609 Other forms of dyspnea: Secondary | ICD-10-CM | POA: Insufficient documentation

## 2012-08-27 DIAGNOSIS — F1410 Cocaine abuse, uncomplicated: Secondary | ICD-10-CM | POA: Insufficient documentation

## 2012-08-27 DIAGNOSIS — F1210 Cannabis abuse, uncomplicated: Secondary | ICD-10-CM | POA: Insufficient documentation

## 2012-08-27 DIAGNOSIS — I509 Heart failure, unspecified: Secondary | ICD-10-CM | POA: Insufficient documentation

## 2012-08-27 DIAGNOSIS — F17200 Nicotine dependence, unspecified, uncomplicated: Secondary | ICD-10-CM | POA: Insufficient documentation

## 2012-08-27 LAB — BASIC METABOLIC PANEL
BUN: 10 mg/dL (ref 6–23)
Chloride: 103 mEq/L (ref 96–112)
Creatinine, Ser: 1.16 mg/dL (ref 0.50–1.35)
GFR calc Af Amer: 76 mL/min — ABNORMAL LOW (ref >90)
GFR calc non Af Amer: 66 mL/min — ABNORMAL LOW (ref >90)
Potassium: 3.7 mEq/L (ref 3.5–5.1)

## 2012-08-27 MED ORDER — DIGOXIN 250 MCG PO TABS: 250.0000 ug | ORAL_TABLET | Freq: Every day | ORAL | Status: DC

## 2012-08-27 MED ORDER — ISOSORBIDE MONONITRATE ER 30 MG PO TB24: 30.0000 mg | ORAL_TABLET | Freq: Two times a day (BID) | ORAL | Status: DC

## 2012-08-27 MED ORDER — CARVEDILOL 12.5 MG PO TABS: 18.7500 mg | ORAL_TABLET | Freq: Two times a day (BID) | ORAL | Status: DC

## 2012-08-27 MED ORDER — LISINOPRIL 10 MG PO TABS: 10.0000 mg | ORAL_TABLET | Freq: Two times a day (BID) | ORAL | Status: DC

## 2012-08-27 MED ORDER — SPIRONOLACTONE 25 MG PO TABS: 25.0000 mg | ORAL_TABLET | Freq: Every day | ORAL | Status: DC

## 2012-08-27 MED ORDER — FUROSEMIDE 40 MG PO TABS: 40.0000 mg | ORAL_TABLET | Freq: Two times a day (BID) | ORAL | Status: DC

## 2012-08-27 MED ORDER — HYDRALAZINE HCL 50 MG PO TABS: 25.0000 mg | ORAL_TABLET | Freq: Two times a day (BID) | ORAL | Status: DC

## 2012-08-27 NOTE — Patient Instructions (Addendum)
Follow up in 2 weeks with an ECHO    Do the following things EVERYDAY: 1) Weigh yourself in the morning before breakfast. Write it down and keep it in a log. 2) Take your medicines as prescribed 3) Eat low salt foods-Limit salt (sodium) to 2000 mg per day.  4) Stay as active as you can everyday 5) Limit all fluids for the day to less than 2 liters 

## 2012-08-27 NOTE — Assessment & Plan Note (Signed)
Instructed to restart medications today. Follow up in  2 weeks.
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
## 2012-08-27 NOTE — Assessment & Plan Note (Signed)
NYHA IIIB. Volume status mildly elevated as expected off diuretics for a week. Restart all HF medications today. Reinforced daily weights, low salt food choices, medication compliance, and limiting fluid intake to < 2 liters per day. Check BMET Follow up in 2 weeks with an ECHO. IF EF remains down will need to refer for ICD soon.

## 2012-08-29 ENCOUNTER — Emergency Department (HOSPITAL_COMMUNITY): Payer: Medicare Other

## 2012-08-29 ENCOUNTER — Observation Stay (HOSPITAL_COMMUNITY)
Admission: EM | Admit: 2012-08-29 | Discharge: 2012-08-30 | Disposition: A | Discharge status: Home / Self Care | Payer: Medicare Other | Attending: Family Medicine | Admitting: Family Medicine

## 2012-08-29 ENCOUNTER — Encounter (HOSPITAL_COMMUNITY): Payer: Self-pay | Admitting: Emergency Medicine

## 2012-08-29 DIAGNOSIS — F1410 Cocaine abuse, uncomplicated: Secondary | ICD-10-CM | POA: Insufficient documentation

## 2012-08-29 DIAGNOSIS — R079 Chest pain, unspecified: Principal | ICD-10-CM | POA: Insufficient documentation

## 2012-08-29 DIAGNOSIS — N183 Chronic kidney disease, stage 3 unspecified: Secondary | ICD-10-CM | POA: Insufficient documentation

## 2012-08-29 DIAGNOSIS — I428 Other cardiomyopathies: Secondary | ICD-10-CM | POA: Insufficient documentation

## 2012-08-29 DIAGNOSIS — Z79899 Other long term (current) drug therapy: Secondary | ICD-10-CM | POA: Insufficient documentation

## 2012-08-29 DIAGNOSIS — I5023 Acute on chronic systolic (congestive) heart failure: Secondary | ICD-10-CM | POA: Insufficient documentation

## 2012-08-29 DIAGNOSIS — I129 Hypertensive chronic kidney disease with stage 1 through stage 4 chronic kidney disease, or unspecified chronic kidney disease: Secondary | ICD-10-CM | POA: Insufficient documentation

## 2012-08-29 DIAGNOSIS — I509 Heart failure, unspecified: Secondary | ICD-10-CM | POA: Insufficient documentation

## 2012-08-29 DIAGNOSIS — I5022 Chronic systolic (congestive) heart failure: Secondary | ICD-10-CM

## 2012-08-29 DIAGNOSIS — H409 Unspecified glaucoma: Secondary | ICD-10-CM | POA: Insufficient documentation

## 2012-08-29 DIAGNOSIS — I5042 Chronic combined systolic (congestive) and diastolic (congestive) heart failure: Secondary | ICD-10-CM

## 2012-08-29 HISTORY — DX: Chronic combined systolic (congestive) and diastolic (congestive) heart failure: I50.42

## 2012-08-29 HISTORY — DX: Other psychoactive substance abuse, uncomplicated: F19.10

## 2012-08-29 HISTORY — DX: Chronic kidney disease, stage 3 unspecified: N18.30

## 2012-08-29 HISTORY — DX: Personal history of other medical treatment: Z92.89

## 2012-08-29 HISTORY — DX: Chronic kidney disease, stage 3 (moderate): N18.3

## 2012-08-29 HISTORY — DX: Low back pain: M54.5

## 2012-08-29 HISTORY — DX: Unspecified osteoarthritis, unspecified site: M19.90

## 2012-08-29 HISTORY — DX: Low back pain, unspecified: M54.50

## 2012-08-29 HISTORY — DX: Other chronic pain: G89.29

## 2012-08-29 HISTORY — DX: Personal history of pneumonia (recurrent): Z87.01

## 2012-08-29 LAB — CBC
HCT: 44.7 % (ref 39.0–52.0)
Hemoglobin: 15.2 g/dL (ref 13.0–17.0)
MCHC: 34.7 g/dL (ref 30.0–36.0)
MCHC: 34.7 g/dL (ref 30.0–36.0)
Platelets: 205 10*3/uL (ref 150–400)
RDW: 14.9 % (ref 11.5–15.5)
RDW: 14.9 % (ref 11.5–15.5)
WBC: 8.5 10*3/uL (ref 4.0–10.5)
WBC: 7.8 10*3/uL (ref 4.0–10.5)

## 2012-08-29 LAB — POCT I-STAT TROPONIN I
Troponin i, poc: 0.01 ng/mL (ref 0.00–0.08)
Troponin i, poc: 0 ng/mL (ref 0.00–0.08)

## 2012-08-29 LAB — CREATININE, SERUM
Creatinine, Ser: 1.19 mg/dL (ref 0.50–1.35)
GFR calc non Af Amer: 64 mL/min — ABNORMAL LOW (ref >90)

## 2012-08-29 LAB — RAPID URINE DRUG SCREEN, HOSP PERFORMED
Amphetamines: NOT DETECTED
Opiates: NOT DETECTED

## 2012-08-29 LAB — BASIC METABOLIC PANEL
Chloride: 106 mEq/L (ref 96–112)
GFR calc Af Amer: 70 mL/min — ABNORMAL LOW (ref >90)
GFR calc non Af Amer: 61 mL/min — ABNORMAL LOW (ref >90)
Potassium: 3.5 mEq/L (ref 3.5–5.1)
Sodium: 138 mEq/L (ref 135–145)

## 2012-08-29 LAB — LIPID PANEL
Cholesterol: 177 mg/dL (ref 0–200)
HDL: 46 mg/dL (ref >39)
LDL Cholesterol: 91 mg/dL (ref 0–99)
Triglycerides: 201 mg/dL — ABNORMAL HIGH (ref <150)
VLDL: 40 mg/dL (ref 0–40)

## 2012-08-29 LAB — TSH: TSH: 1.259 u[IU]/mL (ref 0.350–4.500)

## 2012-08-29 LAB — TROPONIN I: Troponin I: <0.3 ng/mL (ref <0.30)

## 2012-08-29 LAB — HEMOGLOBIN A1C: Mean Plasma Glucose: 103 mg/dL (ref <117)

## 2012-08-29 MED ORDER — HYDRALAZINE HCL 25 MG PO TABS
25.0000 mg | ORAL_TABLET | Freq: Two times a day (BID) | ORAL | Status: DC
  Administered 2012-08-29 – 2012-08-30 (×3): 25 mg via ORAL
  Filled 2012-08-29 (×4): qty 1

## 2012-08-29 MED ORDER — SPIRONOLACTONE 25 MG PO TABS
25.0000 mg | ORAL_TABLET | Freq: Every day | ORAL | Status: DC
  Administered 2012-08-29 – 2012-08-30 (×2): 25 mg via ORAL
  Filled 2012-08-29 (×2): qty 1

## 2012-08-29 MED ORDER — SODIUM CHLORIDE 0.9 % IJ SOLN
3.0000 mL | Freq: Two times a day (BID) | INTRAMUSCULAR | Status: DC
  Administered 2012-08-29 – 2012-08-30 (×3): 3 mL via INTRAVENOUS

## 2012-08-29 MED ORDER — ASPIRIN EC 81 MG PO TBEC
81.0000 mg | DELAYED_RELEASE_TABLET | Freq: Every day | ORAL | Status: DC
  Administered 2012-08-29 – 2012-08-30 (×2): 81 mg via ORAL
  Filled 2012-08-29 (×2): qty 1

## 2012-08-29 MED ORDER — LATANOPROST 0.005 % OP SOLN
1.0000 [drp] | Freq: Every day | OPHTHALMIC | Status: DC
  Administered 2012-08-29: 1 [drp] via OPHTHALMIC
  Filled 2012-08-29 (×2): qty 2.5

## 2012-08-29 MED ORDER — SODIUM CHLORIDE 0.9 % IJ SOLN: 3.0000 mL | INTRAMUSCULAR | Status: DC | PRN

## 2012-08-29 MED ORDER — ASPIRIN 81 MG PO TBEC: 81.0000 mg | DELAYED_RELEASE_TABLET | Freq: Every day | ORAL | Status: DC

## 2012-08-29 MED ORDER — TIMOLOL HEMIHYDRATE 0.5 % OP SOLN: 1.0000 [drp] | Freq: Two times a day (BID) | OPHTHALMIC | Status: DC

## 2012-08-29 MED ORDER — TIMOLOL MALEATE 0.5 % OP SOLN
1.0000 [drp] | Freq: Two times a day (BID) | OPHTHALMIC | Status: DC
  Administered 2012-08-29 – 2012-08-30 (×3): 1 [drp] via OPHTHALMIC
  Filled 2012-08-29: qty 5

## 2012-08-29 MED ORDER — NITROGLYCERIN 0.4 MG SL SUBL
0.4000 mg | SUBLINGUAL_TABLET | SUBLINGUAL | Status: DC | PRN
  Administered 2012-08-29 (×2): 0.4 mg via SUBLINGUAL

## 2012-08-29 MED ORDER — LISINOPRIL 10 MG PO TABS
10.0000 mg | ORAL_TABLET | Freq: Two times a day (BID) | ORAL | Status: DC
  Administered 2012-08-29 – 2012-08-30 (×3): 10 mg via ORAL
  Filled 2012-08-29 (×4): qty 1

## 2012-08-29 MED ORDER — CARVEDILOL 6.25 MG PO TABS
18.7500 mg | ORAL_TABLET | Freq: Two times a day (BID) | ORAL | Status: DC
  Administered 2012-08-29 – 2012-08-30 (×2): 18.75 mg via ORAL
  Filled 2012-08-29 (×4): qty 1

## 2012-08-29 MED ORDER — FUROSEMIDE 40 MG PO TABS
40.0000 mg | ORAL_TABLET | Freq: Two times a day (BID) | ORAL | Status: DC
  Administered 2012-08-29 – 2012-08-30 (×2): 40 mg via ORAL
  Filled 2012-08-29 (×4): qty 1

## 2012-08-29 MED ORDER — SODIUM CHLORIDE 0.9 % IJ SOLN: 3.0000 mL | Freq: Two times a day (BID) | INTRAMUSCULAR | Status: DC

## 2012-08-29 MED ORDER — HYDROCODONE-ACETAMINOPHEN 5-325 MG PO TABS
1.0000 | ORAL_TABLET | ORAL | Status: DC | PRN
  Administered 2012-08-29: 2 via ORAL
  Administered 2012-08-29 – 2012-08-30 (×2): 1 via ORAL
  Filled 2012-08-29: qty 2
  Filled 2012-08-29 (×2): qty 1

## 2012-08-29 MED ORDER — ACETAMINOPHEN 325 MG PO TABS: 650.0000 mg | ORAL_TABLET | Freq: Four times a day (QID) | ORAL | Status: DC | PRN

## 2012-08-29 MED ORDER — SODIUM CHLORIDE 0.9 % IV SOLN: 250.0000 mL | INTRAVENOUS | Status: DC | PRN

## 2012-08-29 MED ORDER — DIGOXIN 250 MCG PO TABS
250.0000 ug | ORAL_TABLET | Freq: Every day | ORAL | Status: DC
  Administered 2012-08-29 – 2012-08-30 (×2): 250 ug via ORAL
  Filled 2012-08-29 (×2): qty 1

## 2012-08-29 MED ORDER — ISOSORBIDE MONONITRATE ER 30 MG PO TB24
30.0000 mg | ORAL_TABLET | Freq: Two times a day (BID) | ORAL | Status: DC
  Administered 2012-08-29 – 2012-08-30 (×3): 30 mg via ORAL
  Filled 2012-08-29 (×4): qty 1

## 2012-08-29 MED ORDER — ACETAMINOPHEN 650 MG RE SUPP: 650.0000 mg | Freq: Four times a day (QID) | RECTAL | Status: DC | PRN

## 2012-08-29 MED ORDER — HEPARIN SODIUM (PORCINE) 5000 UNIT/ML IJ SOLN
5000.0000 [IU] | Freq: Three times a day (TID) | INTRAMUSCULAR | Status: DC
  Filled 2012-08-29 (×5): qty 1
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
## 2012-08-29 NOTE — ED Notes (Signed)
Pt here c/o left sided CP with radiation down left arm x 2 days; pt sts some exertional SOB

## 2012-08-29 NOTE — Progress Notes (Signed)
Pt O4x, lying in bed no complaints of CP

## 2012-08-29 NOTE — ED Notes (Signed)
Admitting at bedside. Pt denies any pain. Attempt report x 1.

## 2012-08-29 NOTE — ED Provider Notes (Signed)
History     CSN: [id number]  Arrival date & time 08/29/12  0801   First MD Initiated Contact with Patient 08/29/12 0813      Chief Complaint  Patient presents with  . Chest Pain    (Consider location/radiation/quality/duration/timing/severity/associated sxs/prior treatment) Patient is a 62 y.o. male presenting with chest pain.  Chest Pain Pain location:  Substernal area and L chest Pain quality: sharp   Pain radiates to:  L shoulder Pain severity:  Severe Onset quality:  Gradual Duration:  1 day Timing:  Constant Progression:  Waxing and waning Chronicity:  Recurrent Context: breathing and stress   Relieved by:  Nothing Worsened by:  Nothing tried Ineffective treatments:  None tried Associated symptoms: nausea, near-syncope and shortness of breath   Associated symptoms: no abdominal pain, no back pain, no cough, no dizziness, no fever, no headache, no lower extremity edema, no syncope and not vomiting     Past Medical History  Diagnosis Date  . CHF (congestive heart failure)     combined  . Chronic kidney disease     stage III  . GERD (gastroesophageal reflux disease)   . HTN (hypertension)     Past Surgical History  Procedure Laterality Date  . Cardiac catheterization  05/05/2010    Family History  Problem Relation Age of Onset  . Coronary artery disease      History  Substance Use Topics  . Smoking status: Current Some Day Smoker  . Smokeless tobacco: Not on file     Comment: 10 pack yr  . Alcohol Use: Yes     Comment: half pint a week      Review of Systems  Constitutional: Negative for fever and chills.  HENT: Negative for congestion, sore throat and rhinorrhea.   Eyes: Negative for photophobia and visual disturbance.  Respiratory: Positive for shortness of breath. Negative for cough.   Cardiovascular: Positive for chest pain and near-syncope. Negative for leg swelling and syncope.  Gastrointestinal: Positive for nausea. Negative for  vomiting, abdominal pain, diarrhea and constipation.  Endocrine: Negative for polydipsia and polyuria.  Genitourinary: Negative for dysuria and hematuria.  Musculoskeletal: Negative for back pain and arthralgias.  Skin: Negative for color change and rash.  Neurological: Negative for dizziness, syncope, light-headedness and headaches.  Hematological: Negative for adenopathy. Does not bruise/bleed easily.  All other systems reviewed and are negative.    Allergies  Review of patient's allergies indicates no known allergies.  Home Medications   Current Outpatient Rx  Name  Route  Sig  Dispense  Refill  . aspirin 81 MG EC tablet   Oral   Take 81 mg by mouth daily.           . carvedilol (COREG) 12.5 MG tablet   Oral   Take 1.5 tablets (18.75 mg total) by mouth 2 (two) times daily with a meal.   90 tablet   6   . digoxin (LANOXIN) 0.25 MG tablet   Oral   Take 1 tablet (250 mcg total) by mouth daily.   30 tablet   3   . furosemide (LASIX) 40 MG tablet   Oral   Take 1 tablet (40 mg total) by mouth 2 (two) times daily.   60 tablet   3   . hydrALAZINE (APRESOLINE) 50 MG tablet   Oral   Take 0.5 tablets (25 mg total) by mouth 2 (two) times daily.   30 tablet   3   . hydrocodone-acetaminophen (LORCET-HD) 5-500 MG  per capsule   Oral   Take 1 capsule by mouth every 6 (six) hours as needed for pain.          . isosorbide mononitrate (IMDUR) 30 MG 24 hr tablet   Oral   Take 1 tablet (30 mg total) by mouth 2 (two) times daily.   60 tablet   6   . latanoprost (XALATAN) 0.005 % ophthalmic solution   Both Eyes   Place 1 drop into both eyes at bedtime.         Marland Kitchen lisinopril (ZESTRIL) 10 MG tablet   Oral   Take 1 tablet (10 mg total) by mouth 2 (two) times daily.   60 tablet   6   . spironolactone (ALDACTONE) 25 MG tablet   Oral   Take 1 tablet (25 mg total) by mouth daily.   30 tablet   6   . timolol (BETIMOL) 0.5 % ophthalmic solution   Both Eyes   Place 1  drop into both eyes 2 (two) times daily.           BP 154/91  Pulse 85  Temp(Src) 98 F (36.7 C) (Oral)  Resp 19  SpO2 99%  Physical Exam  Vitals reviewed. Constitutional: He is oriented to person, place, and time. He appears well-developed and well-nourished.  HENT:  Head: Normocephalic and atraumatic.  Eyes: Conjunctivae and EOM are normal.  Neck: Normal range of motion. Neck supple.  Cardiovascular: Normal rate, regular rhythm and normal heart sounds.   Pulmonary/Chest: Effort normal and breath sounds normal. No respiratory distress.  Abdominal: He exhibits no distension. There is no tenderness. There is no rebound and no guarding.  Musculoskeletal: Normal range of motion.       Right lower leg: He exhibits no edema.       Left lower leg: He exhibits no edema.  Neurological: He is alert and oriented to person, place, and time.  Skin: Skin is warm and dry.    ED Course  Procedures (including critical care time)  Labs Reviewed  BASIC METABOLIC PANEL - Abnormal; Notable for the following:    GFR calc non Af Amer 61 (*)    GFR calc Af Amer 70 (*)    All other components within normal limits  CBC  PRO B NATRIURETIC PEPTIDE  POCT I-STAT TROPONIN I  POCT I-STAT TROPONIN I   Dg Chest 2 View  08/29/2012   *RADIOLOGY REPORT*  Clinical Data: Chest pain  CHEST - 2 VIEW  Comparison: 05/08/2010  Findings: Heart size and vascularity are normal.  Lungs are clear of infiltrate or effusion.  Prominent lung markings seen previously have improved.  Negative for mass or adenopathy.  IMPRESSION: No acute abnormality.  Improved aeration compared with the prior study.   Original Report Authenticated By: Janeece Riggers, M.D.     1. Chest pain      Date: 08/29/2012  Rate: 92  Rhythm: normal sinus rhythm  QRS Axis: normal  Intervals: normal  ST/T Wave abnormalities: nonspecific ST changes  Conduction Disutrbances:none  Narrative Interpretation: NSR with resolution of lateral t wave  inversions  Old EKG Reviewed: changes noted    MDM  62 y.o. male  with pertinent PMH of NICM, last EF 30-35%, DM, CKD presents with exertional chest pain beginning mildly yesterday, acutely worsening this morning during gardening.  Pain severe, radiated to L shoulder, resolved with rest.  Also associated with nausea.  No ho MI.  Physical exam benign, no signs of  CHF.  ECG as above without acute signs of MI.  Trop negative.  Pt took ASA 324 this am.  Given new onset exertional symptoms, feel that pt warrants admission.    Spoke with cardiology who feel medicine admission warranted.  Spoke with family mediicne, pt to be admitted.   Labs and imaging as above reviewed by myself and attending,Dr. Radford Pax, with whom case was discussed.   1. Chest pain             Noel Gerold, MD 08/29/12 [phone]

## 2012-08-30 DIAGNOSIS — I517 Cardiomegaly: Secondary | ICD-10-CM

## 2012-08-30 LAB — BASIC METABOLIC PANEL
BUN: 12 mg/dL (ref 6–23)
Chloride: 103 mEq/L (ref 96–112)
Creatinine, Ser: 1.22 mg/dL (ref 0.50–1.35)
GFR calc Af Amer: 72 mL/min — ABNORMAL LOW (ref >90)
GFR calc non Af Amer: 62 mL/min — ABNORMAL LOW (ref >90)
Glucose, Bld: 110 mg/dL — ABNORMAL HIGH (ref 70–99)
Potassium: 3.7 mEq/L (ref 3.5–5.1)

## 2012-08-30 LAB — TROPONIN I: Troponin I: <0.3 ng/mL (ref <0.30)

## 2012-08-30 MED ORDER — NITROGLYCERIN 0.4 MG SL SUBL: 0.4000 mg | SUBLINGUAL_TABLET | SUBLINGUAL | Status: DC | PRN

## 2012-08-30 NOTE — Progress Notes (Signed)
1515 discharge instructions given . Verbalized understanding 1530 Wheeled to lobby by NT

## 2012-08-30 NOTE — Progress Notes (Signed)
CARDIOLOGY CONSULT NOTE  Patient ID: Ryan [name] MRN: [id number], DOB/AGE: 63-Apr-1952   Admit date: 08/29/2012 Date of Consult: 08/30/2012   Primary Physician: Bernette Redbird, MD Primary Cardiologist: D. Bensimhon, MD    Objective  Didn't sleep well last night. No further CP. Echo 40-45% (improved). Troponins negative .  Inpatient Medications  . aspirin EC  81 mg Oral Daily  . carvedilol  18.75 mg Oral BID WC  . digoxin  250 mcg Oral Daily  . furosemide  40 mg Oral BID  . heparin  5,000 Units Subcutaneous Q8H  . hydrALAZINE  25 mg Oral BID  . isosorbide mononitrate  30 mg Oral BID  . latanoprost  1 drop Both Eyes QHS  . lisinopril  10 mg Oral BID  . sodium chloride  3 mL Intravenous Q12H  . spironolactone  25 mg Oral Daily  . timolol  1 drop Both Eyes BID    Family History Family History  Problem Relation Age of Onset  . Coronary artery disease      Social History History   Social History  . Marital Status: Single    Spouse Name: N/A    Number of Children: N/A  . Years of Education: N/A   Occupational History  . Not on file.   Social History Main Topics  . Smoking status: Current Every Day Smoker -- 0.12 packs/day for 39 years    Types: Cigarettes  . Smokeless tobacco: Never Used  . Alcohol Use: 4.8 oz/week    8 Shots of liquor per week     Comment: 08/29/2012 "drink mostly on the weekends"  . Drug Use: Yes    Special: Cocaine, Marijuana     Comment: 08/29/2012 "smoked weed w/cocaine last Friday"  . Sexually Active: Yes   Other Topics Concern  . Not on file   Social History Narrative   Lives in [place].    Physical Exam  Blood pressure 148/91, pulse 74, temperature 96.8 F (36 C), temperature source Oral, resp. rate 18, height 5\' 11"  (1.803 m), weight 92.488 kg (203 lb 14.4 oz), SpO2 99.00%.  General: Pleasant, NAD Psych: Normal affect. Neuro: Alert and oriented X 3. Moves all extremities spontaneously. HEENT: Normal  Neck: Supple without  bruits or JVP 7.  Lungs:  CTA Heart: RRR no s3, s4, or murmurs. Abdomen: Soft, non-tender, non-distended, BS + x 4.  Extremities: No clubbing, cyanosis or edema. DP/PT/Radials 2+ and equal bilaterally.  Labs  Trop i, poc: 0.01 -> 0.00  pBNP 63.1.  Lab Results  Component Value Date   WBC 7.8 08/29/2012   HGB 15.5 08/29/2012   HCT 44.7 08/29/2012   MCV 90.7 08/29/2012   PLT 205 08/29/2012     Recent Labs Lab 08/30/12 0515  NA 138  K 3.7  CL 103  CO2 26  BUN 12  CREATININE 1.22  CALCIUM 8.9  GLUCOSE 110*   Radiology/Studies  Dg Chest 2 View  08/29/2012   *RADIOLOGY REPORT*  Clinical Data: Chest pain  CHEST - 2 VIEW  Comparison: 05/08/2010  Findings: Heart size and vascularity are normal.  Lungs are clear of infiltrate or effusion.  Prominent lung markings seen previously have improved.  Negative for mass or adenopathy.  IMPRESSION: No acute abnormality.  Improved aeration compared with the prior study.   Original Report Authenticated By: Janeece Riggers, M.D.   ECG  Nsr, 92 bpm, left axis deviation, LVH, poor r wave progression  ASSESSMENT AND PLAN  1.  Chest pain  2.  Chronic combined systolic/diastolic chf: 3.  Polysubstance abuse:   4.  Stage III CKD:  Creat stable.  Discussion: Echo reviewed personally. EF improved from previous. CP resolved. ECG and troponis negative. Cath in 2012 was fine. I think he is ok for d/c today.  We will see him back in clinic in 2-3 weeks. If CP recurs can consider repeat cath.  Truman Hayward

## 2012-08-30 NOTE — Progress Notes (Signed)
Family Medicine Teaching Service Daily Progress Note Intern Pager: [phone]  Patient name: ELOHIM BRUNE Medical record number: [id number] Date of birth: 06-Dec-1950 Age: 62 y.o. Gender: male  Primary Care Provider: Bernette Redbird, MD  Subjective: Patient says he is doing well this morning, although he was unable to sleep 2/2 difficulty breathing. He denies any active pain, but endorses some lightheadedness. He states that the chest pain he experienced yesterday has not occurred again during his hospitalization.  Objective: Temp:  [96.8 F (36 C)-98.6 F (37 C)] 96.8 F (36 C) (06/20 0529) Pulse Rate:  [67-85] 67 (06/20 0529) Resp:  [16-19] 18 (06/20 0529) BP: (103-180)/(63-109) 120/76 mmHg (06/20 0529) SpO2:  [97 %-100 %] 99 % (06/20 0529) Weight:  [202 lb 14.4 oz (92.035 kg)-203 lb 14.4 oz (92.488 kg)] 203 lb 14.4 oz (92.488 kg) (06/20 0529) Exam: General: Sitting comfortably in bed, no acute distress Cardiovascular: Regular rate and rhythm, no murmur heard, no TTP Respiratory: Coarse breath sounds throughout, no wheezing or crackles appreciated Abdomen: Active bowel sounds Extremities: No edema noted, some TTP of posterior left arm Neuro: 5/5 strength in upper extremities bilaterally  Laboratory:  Recent Labs Lab 08/29/12 0830 08/29/12 1602  WBC 8.5 7.8  HGB 15.2 15.5  HCT 43.8 44.7  PLT 197 205    Recent Labs Lab 08/27/12 0949 08/29/12 0830 08/29/12 1602 08/30/12 0515  NA 138 138  --  138  K 3.7 3.5  --  3.7  CL 103 106  --  103  CO2 25 24  --  26  BUN 10 11  --  12  CREATININE 1.16 1.24 1.19 1.22  CALCIUM 9.4 8.9  --  8.9  GLUCOSE 102* 83  --  110*   Cardiac Panel (last 3 results)  Recent Labs  08/29/12 1826 08/30/12 0020  TROPONINI <0.30 <0.30   Imaging/Diagnostic Tests: 08/30/12 - EKG: Pending 08/30/12 - 2D Echo: Pending  Assessment and Plan: DASH CARDARELLI is a 62 y.o. year old male with PMH significant for CHF due to NICM EF 30-35%, CKD (Cr ~  1.5), and HTN presenting with chest pain.   # Chest pain in the setting of CHF- Description of pain in conjunction with normal EKG and POC troponins points to an MSK cause. No hypoxia or tachycardia to suggest PE. No history of GERD or relation to food to suggest GI cause. Per Cardiology, cause of pain is unlikely to be cardiac and can likely be followed-up outpatient if echo and cardiac markers are negative. Troponin has been negative x2. Digoxin level is sub-therapeutic at 0.6, but appropriate in the setting of CHF. - Acetaminophen PRN pain  - Will continue home medications  - Likely discharge today pending echo results [ ]  F/u transthoracic echo  [ ]  F/u repeat EKG today [ ]  Patient can follow-up digoxin level with Cardiology as an outpatient   # History of polysubstance use - Patient reports smoking marijuana as recently as last week. UDS on 6/19 was positive for cocaine. Discussed increased risk of cocaine use in the setting of CHF and with his medications; patient states that he understands and that it's not an issue because "it was a one-time thing."  # CKD, stage III - At baseline on admission at 1.19. Repeat BMET this morning is 1.22. - Continue to monitor kidney function   # Hypertension - Elevated BPs in the ED. Well controlled back on home medications. - Continue home medications  - Continue to monitor   #  Glaucoma - Followed at the [place].  - Continue home medications   FEN/GI: Heart healthy; SLIV PPx: SQ heparin Dispo: Pending cardiac rule out, self-care at home Code Status: FULL, as discussed with patient  Kittie Plater, Med Student 08/30/2012, 8:49 AM PGY-1, [facility] Family Medicine FPTS Intern pager: [phone]  PGY-2 Addendum S: Feeling well this morning.  No current chest pain (but can still "feel" it) or trouble breathing.    O: BP 120/76  Pulse 67  Temp(Src) 96.8 F (36 C) (Oral)  Resp 18  Ht 5\' 11"  (1.803 m)  Wt 203 lb 14.4 oz (92.488 kg)  BMI 28.45 kg/m2   SpO2 99% Gen: alert, cooperative, NAD, lying in bed comfortably HEENT: AT/[place], sclera white, MMM CV: RRR, no murmurs Pulm: CTAB, no wheezes or rales Ext: no edema  A/P: please see above note for complete A/P.  Briefly, 62 yo F with NICM with CHF (30-35%), CKD III, HTN presenting with chest pain after using cocaine.  # Chest pain: Troponins negative x4.  No acute changes on admission EKG.  Resolved currently. - appreciate cardiology recommendations - f/u repeat EKG - f/u echo  # Systolic CHF from NICM: Euvolemic.  Digoxin level appropriate. - appreciate cardiology recommendations - continue current medications  # HTN: Well controlled on home regimen.  # Polysubstance abuse: UDS positive for cocaine. - recommended complete cessation due to heart condition and medications  # Dispo: Pending echo read.  Will likely need further cardiac evaluation, but this may be able to be done as an outpatient depending on the echo read.  BOOTH, [place] 08/30/2012, 9:32 AM

## 2012-08-30 NOTE — Progress Notes (Signed)
  Echocardiogram 2D Echocardiogram has been performed.  Ryan [name] 08/30/2012, 8:25 AM

## 2012-08-30 NOTE — Discharge Summary (Signed)
Family Medicine Teaching [facility] Discharge Summary  Patient name: Ryan [name] Medical record number: [id number] Date of birth: [birth year] Age: 62 y.o. Gender: male Date of Admission: 08/29/2012  Date of Discharge: 08/30/2012 Admitting Physician: Ryan Grim, MD  Primary Care Provider: Bernette Redbird, MD  Indication for Hospitalization: Chest pain, cardiac workup Discharge Diagnoses:  - Chest pain - Systolic CHF from NICM - HTN - Polysubstance use - CKD, stage III - Glaucoma  Consultations: Ryan Buckles. Bensimhon, MD in Cardiology  Significant Labs and Imaging:   Recent Labs Lab 08/29/12 0830 08/29/12 1602  WBC 8.5 7.8  HGB 15.2 15.5  HCT 43.8 44.7  PLT 197 205    Recent Labs Lab 08/27/12 0949 08/29/12 0830 08/29/12 1602 08/30/12 0515  NA 138 138  --  138  K 3.7 3.5  --  3.7  CL 103 106  --  103  CO2 25 24  --  26  GLUCOSE 102* 83  --  110*  BUN 10 11  --  12  CREATININE 1.16 1.24 1.19 1.22  CALCIUM 9.4 8.9  --  8.9   Cardiac Panel (last 3 results)  Recent Labs  08/29/12 1826 08/30/12 0020  TROPONINI <0.30 <0.30    Procedures:  08/29/12 - EKG: Normal sinus rhythm, LAD, poor R wave progression; no ST or T wave changes  08/29/12 - CXR: Normal 08/30/12 - EKG: Unchanged from EKG on 6/19 08/30/12 - 2D Echo: EF 40-45%, noted improvement from previous study in 04/2011 per Cardiology   Brief Hospital Course: Ryan [name] is a 62 y.o. year old male with PMH significant for CHF due to NICM (EF 30-35%), CKD (Cr ~ 1.5), and HTN presenting with chest pain. Please see H&P for more details. While admitted, Ryan [name] was evaluated for an acute cardiac event and remained chest pain-free and stable throughout his stay. The remainder of his hospital course is as below by problem:  # Chest pain in the setting of CHF- Description of pain on admission in conjunction with normal EKG and negative POC troponins x2 were indicative of a non-cardiac cause, possibly  musculoskeletal. No hypoxia or tachycardia to suggest PE. No history of GERD or relation to food to suggest GI cause. Patient was placed on telemetry during his admission with no events. Repeat troponins were negative x2 and repeat EKG on day of discharge was unchanged from admission. 2D echo on day of discharge showed EF 40-45% and no acute changes from previous study. Per cardiology, cause of pain is unlikely to be cardiac; given clean cath in 2012 with current negative echo and cardiac markers, patient can be followed-up in the outpatient setting. Digoxin level on 6/20 is appropriate for heart failure at 0.6; will defer to cardiology for any medication adjustment.  He remained euvolemic through out the admission with no signs or symptoms of heart failure exacerbation.  # Hypertension - Elevated BPs in the ED; patient had not taken his home medications on day of admission. Well-controlled once back on home medications. Stable throughout admission.   # Polysubstance use - Patient reports smoking marijuana as recently as last week; later endorsed that it was "reefer laced with cocaine." UDS on 6/19 was negative for marijuana, but positive for cocaine. Discussed increased risk of cocaine use in the setting of his CHF and with his medications; patient states that he understands and that it's not an issue because "it was a one-time thing." Per Cardiology, coronary spasm 2/2 cocaine use could have triggered  chest pain, but unlikely as troponins were negative.  # CKD, stage III - Cr at 1.19 on admission (baseline ~1.5). Repeat BMET on day of discharge showed Cr stable at 1.22.   # Glaucoma - Followed at the [place]. Continued home medications, stable during admission.    Discharge Medications:    Medication List    TAKE these medications       aspirin 81 MG EC tablet  Take 81 mg by mouth daily.     carvedilol 12.5 MG tablet  Commonly known as:  COREG  Take 1.5 tablets (18.75 mg total) by mouth 2 (two)  times daily with a meal.     digoxin 0.25 MG tablet  Commonly known as:  LANOXIN  Take 1 tablet (250 mcg total) by mouth daily.     furosemide 40 MG tablet  Commonly known as:  LASIX  Take 1 tablet (40 mg total) by mouth 2 (two) times daily.     hydrALAZINE 50 MG tablet  Commonly known as:  APRESOLINE  Take 0.5 tablets (25 mg total) by mouth 2 (two) times daily.     hydrocodone-acetaminophen 5-500 MG per capsule  Commonly known as:  LORCET-HD  Take 1 capsule by mouth every 6 (six) hours as needed for pain.     isosorbide mononitrate 30 MG 24 hr tablet  Commonly known as:  IMDUR  Take 1 tablet (30 mg total) by mouth 2 (two) times daily.     latanoprost 0.005 % ophthalmic solution  Commonly known as:  XALATAN  Place 1 drop into both eyes at bedtime.     lisinopril 10 MG tablet  Commonly known as:  ZESTRIL  Take 1 tablet (10 mg total) by mouth 2 (two) times daily.     nitroGLYCERIN 0.4 MG SL tablet  Commonly known as:  NITROSTAT  Place 1 tablet (0.4 mg total) under the tongue every 5 (five) minutes as needed for chest pain.     spironolactone 25 MG tablet  Commonly known as:  ALDACTONE  Take 1 tablet (25 mg total) by mouth daily.     timolol 0.5 % ophthalmic solution  Commonly known as:  BETIMOL  Place 1 drop into both eyes 2 (two) times daily.        Disposition: Self-care at home  Issues for Follow Up:  [ ]  Follow-up chest pain and digoxin level as an outpatient with Cardiology in 2-3 weeks  Outstanding Results: None  Discharge Instructions: Please refer to Patient Instructions section of EMR for full details.  Patient was counseled important signs and symptoms that should prompt return to medical care, changes in medications, dietary instructions, activity restrictions, and follow up appointments.   Follow-up Information   Call Ryan Redbird, MD. (for an appointment in 1-2 weeks)       Call Ryan Meres, MD. (for appointment in 2-3 weeks)    Contact  information:   [address] [place] [address] [phone]      Discharge Condition: Clinically improved with negative cardiac workup  BOOTH, Morghan Kester, MD 08/30/2012, 2:20 PM

## 2012-08-30 NOTE — Discharge Summary (Signed)
Family Medicine Teaching Service  Discharge Note : Attending Kariah Loredo MD Pager [phone] Office [phone] I have seen and examined this patient, reviewed their chart and discussed discharge planning wit the resident at the time of discharge. I agree with the discharge plan as above.  

## 2012-08-30 NOTE — Progress Notes (Signed)
FMTS Attending Daily Note: Allura Doepke MD [phone] pager office [phone] I  have seen and examined this patient, reviewed their chart. I have discussed this patient with the resident. I agree with the resident's findings, assessment and care plan. 

## 2012-08-31 NOTE — ED Provider Notes (Signed)
I saw and evaluated the patient, reviewed the resident's note and I agree with the findings and plan.   .Face to face Exam:  General:  Awake HEENT:  Atraumatic Resp:  Normal effort Abd:  Nondistended Neuro:No focal weakness   Nelia Shi, MD 08/31/12 [phone]

## 2012-09-03 NOTE — H&P (Signed)
FMTS Attending Admission Note: Sara Neal MD [phone] pager office [phone] I  have seen and examined this patient, reviewed their chart. I have discussed this patient with the resident. I agree with the resident's findings, assessment and care plan. 

## 2012-09-11 ENCOUNTER — Encounter (HOSPITAL_COMMUNITY): Payer: Medicare Other

## 2012-09-12 ENCOUNTER — Ambulatory Visit (HOSPITAL_COMMUNITY): Payer: Medicare Other

## 2012-09-20 ENCOUNTER — Ambulatory Visit (HOSPITAL_COMMUNITY): Payer: Medicare Other

## 2012-09-23 ENCOUNTER — Ambulatory Visit (HOSPITAL_COMMUNITY): Admission: RE | Admit: 2012-09-23 | Payer: Medicare Other | Source: Ambulatory Visit

## 2013-01-03 ENCOUNTER — Emergency Department (HOSPITAL_COMMUNITY)
Admission: EM | Admit: 2013-01-03 | Discharge: 2013-01-03 | Disposition: A | Discharge status: Home / Self Care | Payer: Medicare Other | Attending: Emergency Medicine | Admitting: Emergency Medicine

## 2013-01-03 ENCOUNTER — Encounter (HOSPITAL_COMMUNITY): Payer: Self-pay | Admitting: Emergency Medicine

## 2013-01-03 DIAGNOSIS — K089 Disorder of teeth and supporting structures, unspecified: Secondary | ICD-10-CM | POA: Insufficient documentation

## 2013-01-03 DIAGNOSIS — Z79899 Other long term (current) drug therapy: Secondary | ICD-10-CM | POA: Insufficient documentation

## 2013-01-03 DIAGNOSIS — R51 Headache: Secondary | ICD-10-CM | POA: Insufficient documentation

## 2013-01-03 DIAGNOSIS — Z8701 Personal history of pneumonia (recurrent): Secondary | ICD-10-CM | POA: Insufficient documentation

## 2013-01-03 DIAGNOSIS — Z8659 Personal history of other mental and behavioral disorders: Secondary | ICD-10-CM | POA: Insufficient documentation

## 2013-01-03 DIAGNOSIS — K029 Dental caries, unspecified: Secondary | ICD-10-CM | POA: Insufficient documentation

## 2013-01-03 DIAGNOSIS — K0889 Other specified disorders of teeth and supporting structures: Secondary | ICD-10-CM

## 2013-01-03 DIAGNOSIS — M1710 Unilateral primary osteoarthritis, unspecified knee: Secondary | ICD-10-CM | POA: Insufficient documentation

## 2013-01-03 DIAGNOSIS — I129 Hypertensive chronic kidney disease with stage 1 through stage 4 chronic kidney disease, or unspecified chronic kidney disease: Secondary | ICD-10-CM | POA: Insufficient documentation

## 2013-01-03 DIAGNOSIS — N183 Chronic kidney disease, stage 3 unspecified: Secondary | ICD-10-CM | POA: Insufficient documentation

## 2013-01-03 DIAGNOSIS — I5042 Chronic combined systolic (congestive) and diastolic (congestive) heart failure: Secondary | ICD-10-CM | POA: Insufficient documentation

## 2013-01-03 DIAGNOSIS — Z8719 Personal history of other diseases of the digestive system: Secondary | ICD-10-CM | POA: Insufficient documentation

## 2013-01-03 DIAGNOSIS — F17200 Nicotine dependence, unspecified, uncomplicated: Secondary | ICD-10-CM | POA: Insufficient documentation

## 2013-01-03 DIAGNOSIS — G8929 Other chronic pain: Secondary | ICD-10-CM | POA: Insufficient documentation

## 2013-01-03 DIAGNOSIS — Z7982 Long term (current) use of aspirin: Secondary | ICD-10-CM | POA: Insufficient documentation

## 2013-01-03 MED ORDER — HYDROCODONE-ACETAMINOPHEN 5-325 MG PO TABS: 1.0000 | ORAL_TABLET | Freq: Four times a day (QID) | ORAL | Status: DC | PRN

## 2013-01-03 MED ORDER — CEPHALEXIN 500 MG PO CAPS: 500.0000 mg | ORAL_CAPSULE | Freq: Four times a day (QID) | ORAL | Status: DC

## 2013-01-03 MED ORDER — KETOROLAC TROMETHAMINE 10 MG PO TABS
10.0000 mg | ORAL_TABLET | Freq: Once | ORAL | Status: AC
  Administered 2013-01-03: 10 mg via ORAL
  Filled 2013-01-03: qty 1

## 2013-01-03 MED ORDER — MELOXICAM 15 MG PO TABS: 15.0000 mg | ORAL_TABLET | Freq: Every day | ORAL | Status: DC

## 2013-01-03 MED ORDER — CEPHALEXIN 500 MG PO CAPS
500.0000 mg | ORAL_CAPSULE | Freq: Once | ORAL | Status: AC
  Administered 2013-01-03: 500 mg via ORAL
  Filled 2013-01-03: qty 1
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
## 2013-01-03 NOTE — ED Notes (Signed)
Dental pain left lower jaw
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
## 2013-01-05 NOTE — ED Provider Notes (Signed)
Medical screening examination/treatment/procedure(s) were performed by non-physician practitioner and as supervising physician I was immediately available for consultation/collaboration.  Aarav Burgett B. Kewana Sanon, MD 01/05/13 2249 

## 2013-04-23 ENCOUNTER — Encounter (HOSPITAL_COMMUNITY): Payer: Self-pay | Admitting: Emergency Medicine

## 2013-04-23 ENCOUNTER — Emergency Department (HOSPITAL_COMMUNITY): Payer: Medicare Other

## 2013-04-23 ENCOUNTER — Inpatient Hospital Stay (HOSPITAL_COMMUNITY): Payer: Medicare Other

## 2013-04-23 ENCOUNTER — Inpatient Hospital Stay (HOSPITAL_COMMUNITY)
Admission: EM | Admit: 2013-04-23 | Discharge: 2013-04-25 | DRG: 313 | Disposition: A | Discharge status: Home / Self Care | Payer: Medicare Other | Attending: Internal Medicine | Admitting: Internal Medicine

## 2013-04-23 DIAGNOSIS — N183 Chronic kidney disease, stage 3 unspecified: Secondary | ICD-10-CM | POA: Diagnosis present

## 2013-04-23 DIAGNOSIS — F17200 Nicotine dependence, unspecified, uncomplicated: Secondary | ICD-10-CM

## 2013-04-23 DIAGNOSIS — M545 Low back pain, unspecified: Secondary | ICD-10-CM

## 2013-04-23 DIAGNOSIS — I509 Heart failure, unspecified: Secondary | ICD-10-CM | POA: Diagnosis present

## 2013-04-23 DIAGNOSIS — Z7982 Long term (current) use of aspirin: Secondary | ICD-10-CM

## 2013-04-23 DIAGNOSIS — H409 Unspecified glaucoma: Secondary | ICD-10-CM

## 2013-04-23 DIAGNOSIS — I10 Essential (primary) hypertension: Secondary | ICD-10-CM

## 2013-04-23 DIAGNOSIS — I5022 Chronic systolic (congestive) heart failure: Secondary | ICD-10-CM

## 2013-04-23 DIAGNOSIS — I428 Other cardiomyopathies: Secondary | ICD-10-CM | POA: Diagnosis present

## 2013-04-23 DIAGNOSIS — R0602 Shortness of breath: Secondary | ICD-10-CM

## 2013-04-23 DIAGNOSIS — R0789 Other chest pain: Principal | ICD-10-CM | POA: Diagnosis present

## 2013-04-23 DIAGNOSIS — I5042 Chronic combined systolic (congestive) and diastolic (congestive) heart failure: Secondary | ICD-10-CM | POA: Diagnosis present

## 2013-04-23 DIAGNOSIS — I498 Other specified cardiac arrhythmias: Secondary | ICD-10-CM | POA: Diagnosis present

## 2013-04-23 DIAGNOSIS — Z8249 Family history of ischemic heart disease and other diseases of the circulatory system: Secondary | ICD-10-CM

## 2013-04-23 DIAGNOSIS — R079 Chest pain, unspecified: Secondary | ICD-10-CM

## 2013-04-23 DIAGNOSIS — R52 Pain, unspecified: Secondary | ICD-10-CM

## 2013-04-23 DIAGNOSIS — Z79899 Other long term (current) drug therapy: Secondary | ICD-10-CM

## 2013-04-23 DIAGNOSIS — I129 Hypertensive chronic kidney disease with stage 1 through stage 4 chronic kidney disease, or unspecified chronic kidney disease: Secondary | ICD-10-CM | POA: Diagnosis present

## 2013-04-23 DIAGNOSIS — R1013 Epigastric pain: Secondary | ICD-10-CM

## 2013-04-23 DIAGNOSIS — I5023 Acute on chronic systolic (congestive) heart failure: Secondary | ICD-10-CM

## 2013-04-23 HISTORY — DX: Unspecified glaucoma: H40.9

## 2013-04-23 HISTORY — DX: Mental disorder, not otherwise specified: F99

## 2013-04-23 HISTORY — DX: Major depressive disorder, single episode, unspecified: F32.9

## 2013-04-23 HISTORY — DX: Depression, unspecified: F32.A

## 2013-04-23 LAB — URINALYSIS, ROUTINE W REFLEX MICROSCOPIC
Bilirubin Urine: NEGATIVE
Glucose, UA: NEGATIVE mg/dL
Ketones, ur: NEGATIVE mg/dL
NITRITE: POSITIVE — AB
PROTEIN: NEGATIVE mg/dL
SPECIFIC GRAVITY, URINE: 1.028 (ref 1.005–1.030)
UROBILINOGEN UA: 0.2 mg/dL (ref 0.0–1.0)
pH: 5.5 (ref 5.0–8.0)

## 2013-04-23 LAB — CBC
HCT: 45 % (ref 39.0–52.0)
Hemoglobin: 15.8 g/dL (ref 13.0–17.0)
MCH: 32.6 pg (ref 26.0–34.0)
MCHC: 35.1 g/dL (ref 30.0–36.0)
MCV: 92.8 fL (ref 78.0–100.0)
PLATELETS: 234 10*3/uL (ref 150–400)
RBC: 4.85 MIL/uL (ref 4.22–5.81)
RDW: 15.3 % (ref 11.5–15.5)
WBC: 7.9 10*3/uL (ref 4.0–10.5)

## 2013-04-23 LAB — BASIC METABOLIC PANEL
BUN: 22 mg/dL (ref 6–23)
CHLORIDE: 103 meq/L (ref 96–112)
CO2: 20 meq/L (ref 19–32)
CREATININE: 1.27 mg/dL (ref 0.50–1.35)
Calcium: 8.8 mg/dL (ref 8.4–10.5)
GFR calc Af Amer: 68 mL/min — ABNORMAL LOW (ref >90)
GFR calc non Af Amer: 58 mL/min — ABNORMAL LOW (ref >90)
Glucose, Bld: 87 mg/dL (ref 70–99)
POTASSIUM: 4 meq/L (ref 3.7–5.3)
SODIUM: 139 meq/L (ref 137–147)

## 2013-04-23 LAB — RAPID URINE DRUG SCREEN, HOSP PERFORMED
Amphetamines: NOT DETECTED
Barbiturates: NOT DETECTED
Benzodiazepines: NOT DETECTED
COCAINE: NOT DETECTED
OPIATES: NOT DETECTED
Tetrahydrocannabinol: NOT DETECTED

## 2013-04-23 LAB — PROTIME-INR
INR: 1.06 (ref 0.00–1.49)
PROTHROMBIN TIME: 13.6 s (ref 11.6–15.2)

## 2013-04-23 LAB — MAGNESIUM: MAGNESIUM: 2.2 mg/dL (ref 1.5–2.5)

## 2013-04-23 LAB — URINE MICROSCOPIC-ADD ON

## 2013-04-23 LAB — PRO B NATRIURETIC PEPTIDE: PRO B NATRI PEPTIDE: 180.9 pg/mL — AB (ref 0–125)

## 2013-04-23 LAB — APTT: aPTT: 32 seconds (ref 24–37)

## 2013-04-23 LAB — TROPONIN I: Troponin I: <0.3 ng/mL (ref <0.30)

## 2013-04-23 LAB — POCT I-STAT TROPONIN I: TROPONIN I, POC: 0 ng/mL (ref 0.00–0.08)

## 2013-04-23 LAB — LIPASE, BLOOD: LIPASE: 38 U/L (ref 11–59)

## 2013-04-23 LAB — DIGOXIN LEVEL

## 2013-04-23 LAB — D-DIMER, QUANTITATIVE: D-Dimer, Quant: <0.27 ug/mL-FEU (ref 0.00–0.48)

## 2013-04-23 MED ORDER — FOLIC ACID 1 MG PO TABS
1.0000 mg | ORAL_TABLET | Freq: Every day | ORAL | Status: DC
  Administered 2013-04-24 – 2013-04-25 (×2): 1 mg via ORAL
  Filled 2013-04-23 (×2): qty 1

## 2013-04-23 MED ORDER — NITROGLYCERIN 2 % TD OINT
1.0000 [in_us] | TOPICAL_OINTMENT | Freq: Once | TRANSDERMAL | Status: AC
  Administered 2013-04-23: 1 [in_us] via TOPICAL
  Filled 2013-04-23: qty 1

## 2013-04-23 MED ORDER — ONDANSETRON HCL 4 MG PO TABS: 4.0000 mg | ORAL_TABLET | Freq: Four times a day (QID) | ORAL | Status: DC | PRN

## 2013-04-23 MED ORDER — DOCUSATE SODIUM 100 MG PO CAPS
100.0000 mg | ORAL_CAPSULE | Freq: Two times a day (BID) | ORAL | Status: DC
  Administered 2013-04-23 – 2013-04-25 (×3): 100 mg via ORAL
  Filled 2013-04-23 (×5): qty 1

## 2013-04-23 MED ORDER — IBUPROFEN 800 MG PO TABS
800.0000 mg | ORAL_TABLET | Freq: Three times a day (TID) | ORAL | Status: DC
  Administered 2013-04-24 – 2013-04-25 (×5): 800 mg via ORAL
  Filled 2013-04-23 (×7): qty 1

## 2013-04-23 MED ORDER — CARVEDILOL 6.25 MG PO TABS
18.7500 mg | ORAL_TABLET | Freq: Two times a day (BID) | ORAL | Status: DC
  Administered 2013-04-24 – 2013-04-25 (×3): 18.75 mg via ORAL
  Filled 2013-04-23 (×5): qty 1

## 2013-04-23 MED ORDER — HYDROCODONE-ACETAMINOPHEN 5-325 MG PO TABS
1.0000 | ORAL_TABLET | Freq: Four times a day (QID) | ORAL | Status: DC | PRN
  Administered 2013-04-24: 1 via ORAL
  Filled 2013-04-23: qty 1

## 2013-04-23 MED ORDER — SPIRONOLACTONE 25 MG PO TABS
25.0000 mg | ORAL_TABLET | Freq: Every day | ORAL | Status: DC
  Administered 2013-04-24 – 2013-04-25 (×2): 25 mg via ORAL
  Filled 2013-04-23 (×2): qty 1

## 2013-04-23 MED ORDER — ASPIRIN 81 MG PO TBEC: 81.0000 mg | DELAYED_RELEASE_TABLET | Freq: Every day | ORAL | Status: DC

## 2013-04-23 MED ORDER — ASPIRIN 325 MG PO TABS
325.0000 mg | ORAL_TABLET | ORAL | Status: AC
  Administered 2013-04-23: 325 mg via ORAL
  Filled 2013-04-23: qty 1

## 2013-04-23 MED ORDER — TIMOLOL HEMIHYDRATE 0.5 % OP SOLN: 1.0000 [drp] | Freq: Two times a day (BID) | OPHTHALMIC | Status: DC

## 2013-04-23 MED ORDER — SODIUM CHLORIDE 0.9 % IV SOLN: 250.0000 mL | INTRAVENOUS | Status: DC | PRN

## 2013-04-23 MED ORDER — PANTOPRAZOLE SODIUM 40 MG PO TBEC
40.0000 mg | DELAYED_RELEASE_TABLET | Freq: Every day | ORAL | Status: DC
  Administered 2013-04-24 – 2013-04-25 (×2): 40 mg via ORAL
  Filled 2013-04-23 (×2): qty 1

## 2013-04-23 MED ORDER — SODIUM CHLORIDE 0.9 % IJ SOLN
3.0000 mL | Freq: Two times a day (BID) | INTRAMUSCULAR | Status: DC
  Administered 2013-04-23 – 2013-04-25 (×4): 3 mL via INTRAVENOUS

## 2013-04-23 MED ORDER — ONDANSETRON HCL 4 MG/2ML IJ SOLN: 4.0000 mg | Freq: Four times a day (QID) | INTRAMUSCULAR | Status: DC | PRN

## 2013-04-23 MED ORDER — SORBITOL 70 % SOLN
30.0000 mL | Freq: Every day | Status: DC | PRN
  Filled 2013-04-23: qty 30

## 2013-04-23 MED ORDER — ISOSORBIDE MONONITRATE ER 30 MG PO TB24
30.0000 mg | ORAL_TABLET | Freq: Two times a day (BID) | ORAL | Status: DC
  Administered 2013-04-23 – 2013-04-25 (×4): 30 mg via ORAL
  Filled 2013-04-23 (×5): qty 1

## 2013-04-23 MED ORDER — SODIUM CHLORIDE 0.9 % IJ SOLN: 3.0000 mL | INTRAMUSCULAR | Status: DC | PRN

## 2013-04-23 MED ORDER — ACETAMINOPHEN 325 MG PO TABS: 650.0000 mg | ORAL_TABLET | Freq: Four times a day (QID) | ORAL | Status: DC | PRN

## 2013-04-23 MED ORDER — ALUM & MAG HYDROXIDE-SIMETH 200-200-20 MG/5ML PO SUSP
30.0000 mL | Freq: Four times a day (QID) | ORAL | Status: DC | PRN
  Administered 2013-04-24: 30 mL via ORAL
  Filled 2013-04-23: qty 30

## 2013-04-23 MED ORDER — FUROSEMIDE 40 MG PO TABS
40.0000 mg | ORAL_TABLET | Freq: Two times a day (BID) | ORAL | Status: DC
  Administered 2013-04-24 – 2013-04-25 (×3): 40 mg via ORAL
  Filled 2013-04-23 (×5): qty 1

## 2013-04-23 MED ORDER — HYDRALAZINE HCL 25 MG PO TABS
25.0000 mg | ORAL_TABLET | Freq: Two times a day (BID) | ORAL | Status: DC
  Administered 2013-04-23 – 2013-04-25 (×4): 25 mg via ORAL
  Filled 2013-04-23 (×5): qty 1

## 2013-04-23 MED ORDER — MORPHINE SULFATE 2 MG/ML IJ SOLN
2.0000 mg | INTRAMUSCULAR | Status: DC | PRN
  Administered 2013-04-23: 2 mg via INTRAVENOUS
  Filled 2013-04-23: qty 1

## 2013-04-23 MED ORDER — TIMOLOL MALEATE 0.5 % OP SOLN
1.0000 [drp] | Freq: Two times a day (BID) | OPHTHALMIC | Status: DC
  Administered 2013-04-23 – 2013-04-25 (×4): 1 [drp] via OPHTHALMIC
  Filled 2013-04-23: qty 5

## 2013-04-23 MED ORDER — ASPIRIN EC 81 MG PO TBEC
81.0000 mg | DELAYED_RELEASE_TABLET | Freq: Every day | ORAL | Status: DC
  Administered 2013-04-24 – 2013-04-25 (×2): 81 mg via ORAL
  Filled 2013-04-23 (×2): qty 1

## 2013-04-23 MED ORDER — NITROGLYCERIN 0.4 MG SL SUBL: 0.4000 mg | SUBLINGUAL_TABLET | SUBLINGUAL | Status: DC | PRN

## 2013-04-23 MED ORDER — ENOXAPARIN SODIUM 40 MG/0.4ML ~~LOC~~ SOLN
40.0000 mg | SUBCUTANEOUS | Status: DC
  Filled 2013-04-23 (×3): qty 0.4

## 2013-04-23 MED ORDER — ALBUTEROL SULFATE (2.5 MG/3ML) 0.083% IN NEBU: 2.5000 mg | INHALATION_SOLUTION | RESPIRATORY_TRACT | Status: DC | PRN

## 2013-04-23 MED ORDER — ACETAMINOPHEN 650 MG RE SUPP: 650.0000 mg | Freq: Four times a day (QID) | RECTAL | Status: DC | PRN

## 2013-04-23 MED ORDER — MAGNESIUM CITRATE PO SOLN: 1.0000 | Freq: Once | ORAL | Status: AC | PRN

## 2013-04-23 MED ORDER — LISINOPRIL 10 MG PO TABS
10.0000 mg | ORAL_TABLET | Freq: Two times a day (BID) | ORAL | Status: DC
  Administered 2013-04-23 – 2013-04-25 (×4): 10 mg via ORAL
  Filled 2013-04-23 (×5): qty 1

## 2013-04-23 MED ORDER — SODIUM CHLORIDE 0.9 % IJ SOLN
3.0000 mL | Freq: Two times a day (BID) | INTRAMUSCULAR | Status: DC
  Administered 2013-04-25: 3 mL via INTRAVENOUS

## 2013-04-23 MED ORDER — LATANOPROST 0.005 % OP SOLN
1.0000 [drp] | Freq: Every day | OPHTHALMIC | Status: DC
  Administered 2013-04-23 – 2013-04-24 (×2): 1 [drp] via OPHTHALMIC
  Filled 2013-04-23: qty 2.5

## 2013-04-23 MED ORDER — VITAMIN B-1 100 MG PO TABS
100.0000 mg | ORAL_TABLET | Freq: Every day | ORAL | Status: DC
  Administered 2013-04-23 – 2013-04-25 (×3): 100 mg via ORAL
  Filled 2013-04-23 (×3): qty 1

## 2013-04-23 MED ORDER — IPRATROPIUM BROMIDE 0.02 % IN SOLN: 0.5000 mg | RESPIRATORY_TRACT | Status: DC | PRN

## 2013-04-23 MED ORDER — POLYETHYLENE GLYCOL 3350 17 G PO PACK
17.0000 g | PACK | Freq: Every day | ORAL | Status: DC | PRN
  Filled 2013-04-23: qty 1

## 2013-04-23 MED ORDER — DIGOXIN 250 MCG PO TABS
250.0000 ug | ORAL_TABLET | Freq: Every day | ORAL | Status: DC
  Administered 2013-04-24 – 2013-04-25 (×2): 250 ug via ORAL
  Filled 2013-04-23 (×2): qty 1
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
## 2013-04-23 NOTE — ED Notes (Signed)
Pt reporting hx CHF SOB worsening over past 2 weeks. States he has been taking all his medications. C/o left sided CP that is giving him a headache. Pt is a x 4, skin is warm and dry. Speaking in clear sentences, sister is at bedside.

## 2013-04-23 NOTE — ED Notes (Signed)
Admitting doc at bedside

## 2013-04-23 NOTE — ED Notes (Signed)
Attempted to call report
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
## 2013-04-24 DIAGNOSIS — I519 Heart disease, unspecified: Secondary | ICD-10-CM

## 2013-04-24 LAB — TROPONIN I: Troponin I: <0.3 ng/mL (ref <0.30)

## 2013-04-24 LAB — BASIC METABOLIC PANEL
BUN: 21 mg/dL (ref 6–23)
CO2: 22 mEq/L (ref 19–32)
CREATININE: 1.37 mg/dL — AB (ref 0.50–1.35)
Calcium: 8.7 mg/dL (ref 8.4–10.5)
Chloride: 103 mEq/L (ref 96–112)
GFR calc Af Amer: 62 mL/min — ABNORMAL LOW (ref >90)
GFR, EST NON AFRICAN AMERICAN: 53 mL/min — AB (ref >90)
Glucose, Bld: 80 mg/dL (ref 70–99)
Potassium: 3.8 mEq/L (ref 3.7–5.3)
Sodium: 139 mEq/L (ref 137–147)

## 2013-04-24 LAB — CBC
HCT: 41.7 % (ref 39.0–52.0)
Hemoglobin: 14.2 g/dL (ref 13.0–17.0)
MCH: 32 pg (ref 26.0–34.0)
MCHC: 34.1 g/dL (ref 30.0–36.0)
MCV: 93.9 fL (ref 78.0–100.0)
Platelets: 218 10*3/uL (ref 150–400)
RBC: 4.44 MIL/uL (ref 4.22–5.81)
RDW: 15.3 % (ref 11.5–15.5)
WBC: 6 10*3/uL (ref 4.0–10.5)

## 2013-04-24 LAB — LIPASE, BLOOD: Lipase: 23 U/L (ref 11–59)

## 2013-04-24 NOTE — Progress Notes (Signed)
Nurse Tech reported patient's BP was 94/40 via Dinamap, patient's BP manually is 100/60, will continue to monitor patient. Ruben Im RN

## 2013-04-24 NOTE — Progress Notes (Signed)
  Echocardiogram 2D Echocardiogram has been performed.  Ryan [name] FRANCES 04/24/2013, 1:01 PM

## 2013-04-24 NOTE — Progress Notes (Signed)
[facility] PROGRESS NOTE  Ryan [name] HMC:[id number] DOB: May 18, 1950 DOA: 04/23/2013 PCP: [name], MD  Assessment/Plan: Principal Problem:  Chest pain  - Troponins negative x3 - Continue his outpatient medications-coreg, lisinopril, aspirin nitroglycerin -2-D echo still pending>> await results follow and consult cardiology as appropriate pending result -He is followed by Dr. Jeffie Pollock Active Problems:  Abdominal pain, acute, epigastric  -Continue PPI increased to twice a day HYPERTENSION, UNSPECIFIED  Chronic systolic heart failure  -Continue outpatient medications, -he appears euvolemic at this time. Glaucoma -Continue outpatient meds Tobacco use disorder   Code Status: Full Family Communication: None at bedside Disposition Plan: To home on medical ready   Consultants:  none  Procedures:  2-D echo-pending  Antibiotics:  None  HPI/Subjective: Had chest pain earlier this a.m., denies shortness of breath.  Objective: Filed Vitals:   04/24/13 1726  BP: 107/70  Pulse: 87  Temp:   Resp: 20    Intake/Output Summary (Last 24 hours) at 04/24/13 1804 Last data filed at 04/24/13 1300  Gross per 24 hour  Intake    963 ml  Output    926 ml  Net     37 ml   Filed Weights   04/23/13 1433 04/23/13 1900 04/24/13 0640  Weight: 90.89 kg (200 lb 6 oz) 89.903 kg (198 lb 3.2 oz) 89.2 kg (196 lb 10.4 oz)    Exam:  General: alert & oriented x 3 In NAD Cardiovascular: RRR, nl S1 s2 Respiratory: Decreased breath sounds at the bases, no crackles or Abdomen: soft +BS NT/ND, no masses palpable Extremities: No cyanosis and no edema    Data Reviewed: Basic Metabolic Panel:  Recent Labs Lab 04/23/13 1440 04/23/13 2100 04/24/13 0650  NA 139  --  139  K 4.0  --  3.8  CL 103  --  103  CO2 20  --  22  GLUCOSE 87  --  80  BUN 22  --  21  CREATININE 1.27  --  1.37*  CALCIUM 8.8  --  8.7  MG  --  2.2  --    Liver Function Tests: No results found  for this basename: AST, ALT, ALKPHOS, BILITOT, PROT, ALBUMIN,  in the last 168 hours  Recent Labs Lab 04/23/13 1833 04/24/13 0650  LIPASE 38 23   No results found for this basename: AMMONIA,  in the last 168 hours CBC:  Recent Labs Lab 04/23/13 1440 04/24/13 0650  WBC 7.9 6.0  HGB 15.8 14.2  HCT 45.0 41.7  MCV 92.8 93.9  PLT 234 218   Cardiac Enzymes:  Recent Labs Lab 04/23/13 1833 04/23/13 2336 04/24/13 0655  TROPONINI <0.30 <0.30 <0.30   BNP (last 3 results)  Recent Labs  08/29/12 0830 04/23/13 1440  PROBNP 63.1 180.9*   CBG: No results found for this basename: GLUCAP,  in the last 168 hours  Recent Results (from the past 240 hour(s))  URINE CULTURE     Status: None   Collection Time    04/23/13  8:34 PM      Result Value Ref Range Status   Specimen Description URINE, CLEAN CATCH   Final   Special Requests NONE   Final   Culture  Setup Time     Final   Value: 04/23/2013 21:50     Performed at [place] PENDING   Incomplete   Culture     Final   Value: Culture reincubated for better growth  Performed at [facility]   Report Status PENDING   Incomplete     Studies: Dg Chest 2 View  04/23/2013   CLINICAL DATA:  Congestive heart failure, left arm numbness  EXAM: CHEST  2 VIEW  COMPARISON:  DG CHEST 2 VIEW dated 08/29/2012  FINDINGS: Heart size upper normal. Vascular pattern normal. No consolidation or effusion.  IMPRESSION: No acute findings.  No evidence of congestive heart failure.   Electronically Signed   By: Skipper Cliche M.D.   On: 04/23/2013 15:28   Dg Abd 1 View  04/23/2013   CLINICAL DATA:  Central abdominal pain today  EXAM: ABDOMEN - 1 VIEW  COMPARISON:  05/01/2010  FINDINGS: Gas is seen within multiple small bowel loops and the colon, without evidence of obstruction. No significant stool burden. There are extensive remote posttraumatic changes of the proximal left femur and the pubic bones. There is left  hip osteoarthritis. Focal bony density associated with the upper right sacrum is stable.  No radiopaque urinary tract calculus is identified. No abdominal mass effect.  IMPRESSION: Nonobstructive bowel gas pattern.   Electronically Signed   By: Curlene Dolphin M.D.   On: 04/23/2013 20:41    Scheduled Meds: . aspirin EC  81 mg Oral Daily  . carvedilol  18.75 mg Oral BID WC  . digoxin  250 mcg Oral Daily  . docusate sodium  100 mg Oral BID  . enoxaparin (LOVENOX) injection  40 mg Subcutaneous Q24H  . folic acid  1 mg Oral Daily  . furosemide  40 mg Oral BID  . hydrALAZINE  25 mg Oral BID  . ibuprofen  800 mg Oral TID  . isosorbide mononitrate  30 mg Oral BID  . latanoprost  1 drop Both Eyes QHS  . lisinopril  10 mg Oral BID  . pantoprazole  40 mg Oral Q0600  . sodium chloride  3 mL Intravenous Q12H  . sodium chloride  3 mL Intravenous Q12H  . spironolactone  25 mg Oral Daily  . thiamine  100 mg Oral Daily  . timolol  1 drop Both Eyes BID   Continuous Infusions:   Principal Problem:   Chest pain Active Problems:   HYPERTENSION, UNSPECIFIED   Chronic systolic heart failure   DYSPNEA   Tobacco use disorder   Abdominal pain, acute, epigastric   Glaucoma    Time spent:25     [place] [facility] Pager [phone]. If 7PM-7AM, please contact night-coverage at [email or web address], password [facility] 04/24/2013, 6:04 PM  LOS: 1 day

## 2013-04-24 NOTE — ED Provider Notes (Signed)
Medical screening examination/treatment/procedure(s) were performed by non-physician practitioner and as supervising physician I was immediately available for consultation/collaboration.      Mervin Kung, MD 04/24/13 [phone]

## 2013-04-25 DIAGNOSIS — H409 Unspecified glaucoma: Secondary | ICD-10-CM

## 2013-04-25 MED ORDER — POTASSIUM CHLORIDE CRYS ER 20 MEQ PO TBCR
40.0000 meq | EXTENDED_RELEASE_TABLET | Freq: Once | ORAL | Status: AC
  Administered 2013-04-25: 40 meq via ORAL
  Filled 2013-04-25: qty 2

## 2013-04-25 MED ORDER — DIGOXIN 250 MCG PO TABS
0.2500 mg | ORAL_TABLET | Freq: Every day | ORAL | Status: DC
  Administered 2013-04-25: 0.25 mg via ORAL
  Filled 2013-04-25 (×2): qty 1

## 2013-04-25 MED ORDER — PANTOPRAZOLE SODIUM 40 MG PO TBEC: 40.0000 mg | DELAYED_RELEASE_TABLET | Freq: Every day | ORAL | Status: DC

## 2013-04-25 MED ORDER — IBUPROFEN 800 MG PO TABS: 800.0000 mg | ORAL_TABLET | Freq: Three times a day (TID) | ORAL | Status: DC
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
## 2013-04-25 NOTE — Progress Notes (Signed)
All d/c instructions explained and given to pt.  Verbalized understanding.  D/c off floor via w/c Karie Kirks, RN.

## 2013-04-25 NOTE — Discharge Summary (Signed)
Physician Discharge Summary  Patient ID: Ryan [name] MRN: [id number] DOB/AGE: [birth year] 63 y.o.  Admit date: 04/23/2013 Discharge date: 04/25/2013  Primary Care Physician:  [name], MD  Discharge Diagnoses:    . atypical Chest pain . HYPERTENSION, UNSPECIFIED . Chronic systolic heart failure . DYSPNEA . Tobacco use disorder . Abdominal pain, acute, epigastric . Glaucoma  Consults: Cardiology, Dr. Haroldine Laws   Recommendations for Outpatient Follow-up:  Patient was recommended to followup with Dr. Haroldine Laws 2 weeks  Allergies:  No Known Allergies   Discharge Medications:   Medication List         aspirin 81 MG EC tablet  Take 81 mg by mouth daily.     carvedilol 12.5 MG tablet  Commonly known as:  COREG  Take 1.5 tablets (18.75 mg total) by mouth 2 (two) times daily with a meal.     digoxin 0.25 MG tablet  Commonly known as:  LANOXIN  Take 1 tablet (250 mcg total) by mouth daily.     furosemide 40 MG tablet  Commonly known as:  LASIX  Take 1 tablet (40 mg total) by mouth 2 (two) times daily.     hydrALAZINE 50 MG tablet  Commonly known as:  APRESOLINE  Take 0.5 tablets (25 mg total) by mouth 2 (two) times daily.     HYDROcodone-acetaminophen 5-325 MG per tablet  Commonly known as:  NORCO  Take 1 tablet by mouth every 6 (six) hours as needed for pain.     ibuprofen 800 MG tablet  Commonly known as:  ADVIL,MOTRIN  Take 1 tablet (800 mg total) by mouth 3 (three) times daily. X 5days     isosorbide mononitrate 30 MG 24 hr tablet  Commonly known as:  IMDUR  Take 1 tablet (30 mg total) by mouth 2 (two) times daily.     latanoprost 0.005 % ophthalmic solution  Commonly known as:  XALATAN  Place 1 drop into both eyes at bedtime.     lisinopril 10 MG tablet  Commonly known as:  ZESTRIL  Take 1 tablet (10 mg total) by mouth 2 (two) times daily.     meloxicam 15 MG tablet  Commonly known as:  MOBIC  Take 1 tablet (15 mg total) by mouth daily.      nitroGLYCERIN 0.4 MG SL tablet  Commonly known as:  NITROSTAT  Place 1 tablet (0.4 mg total) under the tongue every 5 (five) minutes as needed for chest pain.     pantoprazole 40 MG tablet  Commonly known as:  PROTONIX  Take 1 tablet (40 mg total) by mouth daily.     spironolactone 25 MG tablet  Commonly known as:  ALDACTONE  Take 1 tablet (25 mg total) by mouth daily.     timolol 0.5 % ophthalmic solution  Commonly known as:  BETIMOL  Place 1 drop into both eyes 2 (two) times daily.         Brief H and P: For complete details please refer to admission H and P, but in [place] is a 63 y.o. male  With prior hx of chronic combined systolic heart failure last 2-D echo with EF of 40-45%, diffuse hypokinesis 08/30/2012, hypertension, gastroesophageal reflux disease, prior history of polysubstance abuse, history of chest pain within normal cath in February of 2012, history of stage III chronic kidney disease who presents to the ED with a three-day history of shortness of breath, midsternal chest pain radiating to the left upper extremity intermittent in nature  and sharp lasting a few seconds, nausea, diaphoresis, an episode of emesis, productive cough of clear sputum. Patient denies any fevers, no chills, no dysuria, no diarrhea, no constipation, no weakness. Patient endorsed some epigastric abdominal pain, recent alcohol use where he drank a pint of gin. Patient deniedany recent long car ride. No recent surgeries.  Patient was seen in the ED EKG done showed a normal sinus rhythm. Chest x-ray was negative for infiltrate or pulmonary edema.    Hospital Course:     Chest pain: With atypical features. Patient was admitted for observation, patient did have some reproducible chest wall tenderness. However he does have history of chronic CHF, hypertension and cardiac history. Cardiac enzymes were negative. 2d Echo was done which showed EF 35-40%, severe hypokinesis of   entireanteroseptal myocardium. There is akinesis of the basalinferior myocardium. There is severe hypokinesis of the midinferior myocardium. Cardiology consult was obtained and patient was seen by Dr Haroldine Laws. Per cardiology, cath in 2012 with normal coronaries so doubt this is ischemic. HF looks well compensated. BNP normal. Patient was recommended to followup in heart failure clinic in 2 weeks and be compliant with his medications and diet.     HYPERTENSION, UNSPECIFIED currently stable    Chronic systolic heart failure currently stable, patient is very well known to cardiology service. He was cleared from cardiac standpoint for discharge.      Tobacco use disorder patient was counseled to quit smoking and drinking.  Epigastric pain: Patient was placed on PPI    Day of Discharge BP 102/66  Pulse 90  Temp(Src) 97.9 F (36.6 C) (Oral)  Resp 20  Ht 5\' 11"  (1.803 m)  Wt 90.2 kg (198 lb 13.7 oz)  BMI 27.75 kg/m2  SpO2 100%  Physical Exam: General: Alert and awake oriented x3 not in any acute distress. CVS: S1-S2 clear no murmur rubs or gallops Chest: clear to auscultation bilaterally, no wheezing rales or rhonchi Abdomen: soft nontender, nondistended, normal bowel sounds Extremities: no cyanosis, clubbing or edema noted bilaterally    The results of significant diagnostics from this hospitalization (including imaging, microbiology, ancillary and laboratory) are listed below for reference.    LAB RESULTS: Basic Metabolic Panel:  Recent Labs Lab 04/23/13 1440 04/23/13 2100 04/24/13 0650  NA 139  --  139  K 4.0  --  3.8  CL 103  --  103  CO2 20  --  22  GLUCOSE 87  --  80  BUN 22  --  21  CREATININE 1.27  --  1.37*  CALCIUM 8.8  --  8.7  MG  --  2.2  --    Liver Function Tests: No results found for this basename: AST, ALT, ALKPHOS, BILITOT, PROT, ALBUMIN,  in the last 168 hours  Recent Labs Lab 04/23/13 1833 04/24/13 0650  LIPASE 38 23   No results found for  this basename: AMMONIA,  in the last 168 hours CBC:  Recent Labs Lab 04/23/13 1440 04/24/13 0650  WBC 7.9 6.0  HGB 15.8 14.2  HCT 45.0 41.7  MCV 92.8 93.9  PLT 234 218   Cardiac Enzymes:  Recent Labs Lab 04/23/13 2336 04/24/13 0655  TROPONINI <0.30 <0.30   BNP: No components found with this basename: POCBNP,  CBG: No results found for this basename: GLUCAP,  in the last 168 hours  Significant Diagnostic Studies:  Dg Chest 2 View  04/23/2013   CLINICAL DATA:  Congestive heart failure, left arm numbness  EXAM: CHEST  2 VIEW  COMPARISON:  DG CHEST 2 VIEW dated 08/29/2012  FINDINGS: Heart size upper normal. Vascular pattern normal. No consolidation or effusion.  IMPRESSION: No acute findings.  No evidence of congestive heart failure.   Electronically Signed   By: Skipper Cliche M.D.   On: 04/23/2013 15:28   Dg Abd 1 View  04/23/2013   CLINICAL DATA:  Central abdominal pain today  EXAM: ABDOMEN - 1 VIEW  COMPARISON:  05/01/2010  FINDINGS: Gas is seen within multiple small bowel loops and the colon, without evidence of obstruction. No significant stool burden. There are extensive remote posttraumatic changes of the proximal left femur and the pubic bones. There is left hip osteoarthritis. Focal bony density associated with the upper right sacrum is stable.  No radiopaque urinary tract calculus is identified. No abdominal mass effect.  IMPRESSION: Nonobstructive bowel gas pattern.   Electronically Signed   By: Curlene Dolphin M.D.   On: 04/23/2013 20:41    2D ECHO: Study Conclusions  - Left ventricle: The cavity size was mildly dilated. Systolic function was moderately reduced. The estimated ejection fraction was in the range of 35% to 40%. There is severe hypokinesis of the entireanteroseptal myocardium. There is akinesis of the basalinferior myocardium. There is severe hypokinesis of the midinferior myocardium. There was an increased relative contribution of atrial contraction to  ventricular filling. Doppler parameters are consistent with abnormal left ventricular relaxation (grade 1 diastolic dysfunction). - Right ventricle: The cavity size was mildly dilated. Wall thickness was normal.    Disposition and Follow-up:     Discharge Orders   Future Appointments Provider Department Dept Phone   05/09/2013 9:40 AM [facility] [phone]   Future Orders Complete By Expires   Diet - low sodium heart healthy  As directed    Increase activity slowly  As directed        DISPOSITION: Home  DIET: Heart healthy diet     DISCHARGE FOLLOW-UP Follow-up Information   Follow up with Glori Bickers, MD On 05/09/2013. (at 9:20 [place] 0200)    Specialty:  Cardiology   Contact information:   [place] [address] [phone]       Follow up with [name], MD. Schedule an appointment as soon as possible for a visit in 2 weeks. (for hospital follow-up)    Specialty:  Internal Medicine      Time spent on Discharge: 35 minutes   Signed:   RAI,Ryan [name] M.D. [facility] 04/25/2013, 3:09 PM Pager: [phone]

## 2013-04-25 NOTE — Discharge Instructions (Signed)
Heart Failure Heart failure means your heart has trouble pumping blood. This makes it hard for your body to work well. Heart failure is usually a long-term (chronic) condition. You must take good care of yourself and follow your doctor's treatment plan. HOME CARE  Take your heart medicine as told by your doctor.  Do not stop taking medicine unless your doctor tells you to.  Do not skip any dose of medicine.  Refill your medicines before they run out.  Take other medicines only as told by your doctor or pharmacist.  Stay active if told by your doctor. The elderly and people with severe heart failure should talk with a doctor about physical activity.  Eat heart healthy foods. Choose foods that are without trans fat and are low in saturated fat, cholesterol, and salt (sodium). This includes fresh or frozen fruits and vegetables, fish, lean meats, fat-free or low-fat dairy foods, whole grains, and high-fiber foods. Lentils and dried peas and beans (legumes) are also good choices.  Limit salt if told by your doctor.  Cook in a healthy way. Roast, grill, broil, bake, poach, steam, or stir-fry foods.  Limit fluids as told by your doctor.  Weigh yourself every morning. Do this after you pee (urinate) and before you eat breakfast. Write down your weight to give to your doctor.  Take your blood pressure and write it down if your doctor tell you to.  Ask your doctor how to check your pulse. Check your pulse as told.  Lose weight if told by your doctor.  Stop smoking or chewing tobacco. Do not use gum or patches that help you quit without your doctor's approval.  Schedule and go to doctor visits as told.  Nonpregnant women should have no more than 1 drink a day. Men should have no more than 2 drinks a day. Talk to your doctor about drinking alcohol.  Stop illegal drug use.  Stay current with shots (immunizations).  Manage your health conditions as told by your doctor.  Learn to manage  your stress.  Rest when you are tired.  If it is really hot outside:  Avoid intense activities.  Use air conditioning or fans, or get in a cooler place.  Avoid caffeine and alcohol.  Wear loose-fitting, lightweight, and light-colored clothing.  If it is really cold outside:  Avoid intense activities.  Layer your clothing.  Wear mittens or gloves, a hat, and a scarf when going outside.  Avoid alcohol.  Learn about heart failure and get support as needed.  Get help to maintain or improve your quality of life and your ability to care for yourself as needed. GET HELP IF:   You gain 03 lb/1.4 kg or more in 1 day or 05 lb/2.3 kg in a week.  You are more short of breath than usual.  You cannot do your normal activities.  You tire easily.  You cough more than normal, especially with activity.  You have any or more puffiness (swelling) in areas such as your hands, feet, ankles, or belly (abdomen).  You cannot sleep because it is hard to breathe.  You feel like your heart is beating fast (palpitations).  You get dizzy or lightheaded when you stand up. GET HELP RIGHT AWAY IF:   You have trouble breathing.  There is a change in mental status, such as becoming less alert or not being able to focus.  You have chest pain or discomfort.  You faint. MAKE SURE YOU:   Understand these   instructions.  Will watch your condition.  Will get help right away if you are not doing well or get worse. Document Released: 12/07/2007 Document Revised: 06/24/2012 Document Reviewed: 09/28/2011 Exit[employer] Patient Information 2014 [employer].  

## 2013-04-26 LAB — URINE CULTURE

## 2013-05-09 ENCOUNTER — Encounter (HOSPITAL_COMMUNITY): Payer: Medicare Other

## 2013-11-17 ENCOUNTER — Telehealth: Payer: Self-pay | Admitting: Physician Assistant
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
## 2013-11-18 ENCOUNTER — Emergency Department (HOSPITAL_COMMUNITY)
Admission: EM | Admit: 2013-11-18 | Discharge: 2013-11-18 | Disposition: A | Discharge status: Home / Self Care | Payer: Medicare Other | Attending: Emergency Medicine | Admitting: Emergency Medicine

## 2013-11-18 ENCOUNTER — Encounter (HOSPITAL_COMMUNITY): Payer: Self-pay | Admitting: Emergency Medicine

## 2013-11-18 ENCOUNTER — Emergency Department (HOSPITAL_COMMUNITY): Payer: Medicare Other

## 2013-11-18 DIAGNOSIS — I209 Angina pectoris, unspecified: Secondary | ICD-10-CM | POA: Diagnosis not present

## 2013-11-18 DIAGNOSIS — K219 Gastro-esophageal reflux disease without esophagitis: Secondary | ICD-10-CM | POA: Insufficient documentation

## 2013-11-18 DIAGNOSIS — H409 Unspecified glaucoma: Secondary | ICD-10-CM | POA: Insufficient documentation

## 2013-11-18 DIAGNOSIS — F17200 Nicotine dependence, unspecified, uncomplicated: Secondary | ICD-10-CM | POA: Diagnosis not present

## 2013-11-18 DIAGNOSIS — Z8701 Personal history of pneumonia (recurrent): Secondary | ICD-10-CM | POA: Insufficient documentation

## 2013-11-18 DIAGNOSIS — N183 Chronic kidney disease, stage 3 unspecified: Secondary | ICD-10-CM | POA: Insufficient documentation

## 2013-11-18 DIAGNOSIS — Z8659 Personal history of other mental and behavioral disorders: Secondary | ICD-10-CM | POA: Diagnosis not present

## 2013-11-18 DIAGNOSIS — M129 Arthropathy, unspecified: Secondary | ICD-10-CM | POA: Diagnosis not present

## 2013-11-18 DIAGNOSIS — Z9889 Other specified postprocedural states: Secondary | ICD-10-CM | POA: Insufficient documentation

## 2013-11-18 DIAGNOSIS — I5042 Chronic combined systolic (congestive) and diastolic (congestive) heart failure: Secondary | ICD-10-CM | POA: Diagnosis not present

## 2013-11-18 DIAGNOSIS — Z7982 Long term (current) use of aspirin: Secondary | ICD-10-CM | POA: Diagnosis not present

## 2013-11-18 DIAGNOSIS — R079 Chest pain, unspecified: Secondary | ICD-10-CM

## 2013-11-18 DIAGNOSIS — I129 Hypertensive chronic kidney disease with stage 1 through stage 4 chronic kidney disease, or unspecified chronic kidney disease: Secondary | ICD-10-CM | POA: Diagnosis not present

## 2013-11-18 DIAGNOSIS — G8929 Other chronic pain: Secondary | ICD-10-CM | POA: Diagnosis not present

## 2013-11-18 DIAGNOSIS — Z79899 Other long term (current) drug therapy: Secondary | ICD-10-CM | POA: Diagnosis not present

## 2013-11-18 LAB — BASIC METABOLIC PANEL
Anion gap: 16 — ABNORMAL HIGH (ref 5–15)
BUN: 9 mg/dL (ref 6–23)
CALCIUM: 9.1 mg/dL (ref 8.4–10.5)
CO2: 23 meq/L (ref 19–32)
CREATININE: 1.11 mg/dL (ref 0.50–1.35)
Chloride: 99 mEq/L (ref 96–112)
GFR calc Af Amer: 79 mL/min — ABNORMAL LOW (ref >90)
GFR calc non Af Amer: 68 mL/min — ABNORMAL LOW (ref >90)
Glucose, Bld: 91 mg/dL (ref 70–99)
Potassium: 3.6 mEq/L — ABNORMAL LOW (ref 3.7–5.3)
Sodium: 138 mEq/L (ref 137–147)

## 2013-11-18 LAB — CBC
HEMATOCRIT: 42.6 % (ref 39.0–52.0)
Hemoglobin: 14.8 g/dL (ref 13.0–17.0)
MCH: 31.1 pg (ref 26.0–34.0)
MCHC: 34.7 g/dL (ref 30.0–36.0)
MCV: 89.5 fL (ref 78.0–100.0)
PLATELETS: 224 10*3/uL (ref 150–400)
RBC: 4.76 MIL/uL (ref 4.22–5.81)
RDW: 15.3 % (ref 11.5–15.5)
WBC: 5.9 10*3/uL (ref 4.0–10.5)

## 2013-11-18 LAB — I-STAT TROPONIN, ED
TROPONIN I, POC: 0 ng/mL (ref 0.00–0.08)
Troponin i, poc: 0.01 ng/mL (ref 0.00–0.08)

## 2013-11-18 LAB — PRO B NATRIURETIC PEPTIDE: Pro B Natriuretic peptide (BNP): 320.9 pg/mL — ABNORMAL HIGH (ref 0–125)

## 2013-11-18 NOTE — ED Notes (Signed)
Pt alert and oriented at discharge.  Pt verbalized understanding of discharge instructions and the need for follow up care.
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
## 2013-11-18 NOTE — ED Notes (Signed)
IV removed from [id number] applied with tape.

## 2013-11-18 NOTE — ED Notes (Signed)
Patient was given a cup of ice water. 

## 2013-11-18 NOTE — ED Notes (Signed)
Pt reports chest pain for several days, denies taking meds for same at home, pt is a pt of dr bensimohn

## 2013-11-18 NOTE — Discharge Instructions (Signed)

## 2013-11-18 NOTE — ED Notes (Signed)
Unable to move pt to Pod C due to no availability.

## 2013-11-24 ENCOUNTER — Inpatient Hospital Stay (HOSPITAL_COMMUNITY): Payer: Medicare Other

## 2013-11-26 DIAGNOSIS — M1732 Unilateral post-traumatic osteoarthritis, left knee: Secondary | ICD-10-CM | POA: Insufficient documentation

## 2013-11-26 DIAGNOSIS — M1652 Unilateral post-traumatic osteoarthritis, left hip: Secondary | ICD-10-CM | POA: Insufficient documentation

## 2013-11-26 DIAGNOSIS — E785 Hyperlipidemia, unspecified: Secondary | ICD-10-CM | POA: Insufficient documentation

## 2013-12-02 ENCOUNTER — Encounter (HOSPITAL_COMMUNITY): Payer: Self-pay
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
## 2013-12-03 ENCOUNTER — Ambulatory Visit (HOSPITAL_COMMUNITY)
Admission: RE | Admit: 2013-12-03 | Discharge: 2013-12-03 | Disposition: A | Discharge status: Home / Self Care | Payer: Medicare Other | Source: Ambulatory Visit | Attending: Internal Medicine | Admitting: Internal Medicine

## 2013-12-03 ENCOUNTER — Encounter (HOSPITAL_COMMUNITY): Payer: Self-pay

## 2013-12-03 VITALS — BP 137/82 | HR 101 | Resp 18 | Wt 208.1 lb

## 2013-12-03 DIAGNOSIS — Z7982 Long term (current) use of aspirin: Secondary | ICD-10-CM | POA: Diagnosis not present

## 2013-12-03 DIAGNOSIS — Z8249 Family history of ischemic heart disease and other diseases of the circulatory system: Secondary | ICD-10-CM | POA: Insufficient documentation

## 2013-12-03 DIAGNOSIS — N183 Chronic kidney disease, stage 3 unspecified: Secondary | ICD-10-CM | POA: Insufficient documentation

## 2013-12-03 DIAGNOSIS — M545 Low back pain, unspecified: Secondary | ICD-10-CM | POA: Diagnosis not present

## 2013-12-03 DIAGNOSIS — I5042 Chronic combined systolic (congestive) and diastolic (congestive) heart failure: Secondary | ICD-10-CM | POA: Insufficient documentation

## 2013-12-03 DIAGNOSIS — I129 Hypertensive chronic kidney disease with stage 1 through stage 4 chronic kidney disease, or unspecified chronic kidney disease: Secondary | ICD-10-CM | POA: Diagnosis not present

## 2013-12-03 DIAGNOSIS — G8929 Other chronic pain: Secondary | ICD-10-CM | POA: Diagnosis not present

## 2013-12-03 DIAGNOSIS — I10 Essential (primary) hypertension: Secondary | ICD-10-CM

## 2013-12-03 DIAGNOSIS — F17200 Nicotine dependence, unspecified, uncomplicated: Secondary | ICD-10-CM | POA: Diagnosis not present

## 2013-12-03 DIAGNOSIS — I5022 Chronic systolic (congestive) heart failure: Secondary | ICD-10-CM

## 2013-12-03 DIAGNOSIS — R079 Chest pain, unspecified: Secondary | ICD-10-CM | POA: Insufficient documentation

## 2013-12-03 DIAGNOSIS — Z9119 Patient's noncompliance with other medical treatment and regimen: Secondary | ICD-10-CM | POA: Insufficient documentation

## 2013-12-03 DIAGNOSIS — Z91199 Patient's noncompliance with other medical treatment and regimen due to unspecified reason: Secondary | ICD-10-CM | POA: Diagnosis not present

## 2013-12-03 DIAGNOSIS — I509 Heart failure, unspecified: Secondary | ICD-10-CM | POA: Diagnosis present

## 2013-12-03 DIAGNOSIS — N182 Chronic kidney disease, stage 2 (mild): Secondary | ICD-10-CM | POA: Insufficient documentation

## 2013-12-03 HISTORY — DX: Other cardiomyopathies: I42.8

## 2013-12-03 LAB — BASIC METABOLIC PANEL
ANION GAP: 10 (ref 5–15)
BUN: 13 mg/dL (ref 6–23)
CALCIUM: 9.2 mg/dL (ref 8.4–10.5)
CO2: 23 mEq/L (ref 19–32)
CREATININE: 1.09 mg/dL (ref 0.50–1.35)
Chloride: 106 mEq/L (ref 96–112)
GFR, EST AFRICAN AMERICAN: 81 mL/min — AB (ref >90)
GFR, EST NON AFRICAN AMERICAN: 70 mL/min — AB (ref >90)
Glucose, Bld: 104 mg/dL — ABNORMAL HIGH (ref 70–99)
Potassium: 4.1 mEq/L (ref 3.7–5.3)
Sodium: 139 mEq/L (ref 137–147)

## 2013-12-03 LAB — PRO B NATRIURETIC PEPTIDE: Pro B Natriuretic peptide (BNP): 97.8 pg/mL (ref 0–125)

## 2013-12-03 MED ORDER — FUROSEMIDE 40 MG PO TABS: 40.0000 mg | ORAL_TABLET | Freq: Every day | ORAL | Status: DC

## 2013-12-03 MED ORDER — POTASSIUM CHLORIDE CRYS ER 20 MEQ PO TBCR: 20.0000 meq | EXTENDED_RELEASE_TABLET | Freq: Every day | ORAL | Status: DC

## 2013-12-03 MED ORDER — CARVEDILOL 3.125 MG PO TABS: 3.1250 mg | ORAL_TABLET | Freq: Two times a day (BID) | ORAL | Status: DC

## 2013-12-03 NOTE — Patient Instructions (Signed)
Call me later at [phone] to give me the name and number of your doctor at the [place].  Start taking coreg 3.125 mg (1 tablet) twice a day.  Start taking lasix 40 mg (1 tablet) daily.  Start taking potassium 20 meq (1 tablet) daily.  Will follow up in 2 weeks with ECHO.  Do the following things EVERYDAY: 1) Weigh yourself in the morning before breakfast. Write it down and keep it in a log. 2) Take your medicines as prescribed 3) Eat low salt foods-Limit salt (sodium) to 2000 mg per day.  4) Stay as active as you can everyday 5) Limit all fluids for the day to less than 2 liters 6)

## 2013-12-03 NOTE — Addendum Note (Signed)
Encounter addended by: Rande Brunt, NP on: 12/03/2013  4:36 PM<BR>     Documentation filed: Notes Section

## 2013-12-19 ENCOUNTER — Encounter (HOSPITAL_COMMUNITY): Payer: Self-pay

## 2013-12-19 ENCOUNTER — Ambulatory Visit (HOSPITAL_COMMUNITY)
Admission: RE | Admit: 2013-12-19 | Discharge: 2013-12-19 | Disposition: A | Discharge status: Home / Self Care | Payer: Medicare Other | Source: Ambulatory Visit | Attending: Cardiology | Admitting: Cardiology

## 2013-12-19 VITALS — BP 163/92 | HR 101 | Resp 20 | Wt 205.5 lb

## 2013-12-19 DIAGNOSIS — M1990 Unspecified osteoarthritis, unspecified site: Secondary | ICD-10-CM | POA: Insufficient documentation

## 2013-12-19 DIAGNOSIS — K219 Gastro-esophageal reflux disease without esophagitis: Secondary | ICD-10-CM | POA: Diagnosis not present

## 2013-12-19 DIAGNOSIS — M545 Low back pain: Secondary | ICD-10-CM | POA: Diagnosis not present

## 2013-12-19 DIAGNOSIS — I5040 Unspecified combined systolic (congestive) and diastolic (congestive) heart failure: Secondary | ICD-10-CM | POA: Diagnosis present

## 2013-12-19 DIAGNOSIS — N183 Chronic kidney disease, stage 3 (moderate): Secondary | ICD-10-CM | POA: Diagnosis not present

## 2013-12-19 DIAGNOSIS — G8929 Other chronic pain: Secondary | ICD-10-CM | POA: Diagnosis not present

## 2013-12-19 DIAGNOSIS — I129 Hypertensive chronic kidney disease with stage 1 through stage 4 chronic kidney disease, or unspecified chronic kidney disease: Secondary | ICD-10-CM | POA: Insufficient documentation

## 2013-12-19 DIAGNOSIS — Z720 Tobacco use: Secondary | ICD-10-CM

## 2013-12-19 DIAGNOSIS — I10 Essential (primary) hypertension: Secondary | ICD-10-CM

## 2013-12-19 DIAGNOSIS — F329 Major depressive disorder, single episode, unspecified: Secondary | ICD-10-CM | POA: Insufficient documentation

## 2013-12-19 DIAGNOSIS — I429 Cardiomyopathy, unspecified: Secondary | ICD-10-CM | POA: Insufficient documentation

## 2013-12-19 DIAGNOSIS — F17210 Nicotine dependence, cigarettes, uncomplicated: Secondary | ICD-10-CM | POA: Insufficient documentation

## 2013-12-19 DIAGNOSIS — I5022 Chronic systolic (congestive) heart failure: Secondary | ICD-10-CM

## 2013-12-19 DIAGNOSIS — R079 Chest pain, unspecified: Secondary | ICD-10-CM | POA: Insufficient documentation

## 2013-12-19 DIAGNOSIS — F17200 Nicotine dependence, unspecified, uncomplicated: Secondary | ICD-10-CM

## 2013-12-19 DIAGNOSIS — J410 Simple chronic bronchitis: Secondary | ICD-10-CM

## 2013-12-19 DIAGNOSIS — J449 Chronic obstructive pulmonary disease, unspecified: Secondary | ICD-10-CM | POA: Insufficient documentation

## 2013-12-19 LAB — BASIC METABOLIC PANEL
Anion gap: 13 (ref 5–15)
BUN: 12 mg/dL (ref 6–23)
CALCIUM: 9.2 mg/dL (ref 8.4–10.5)
CO2: 23 mEq/L (ref 19–32)
CREATININE: 1.19 mg/dL (ref 0.50–1.35)
Chloride: 104 mEq/L (ref 96–112)
GFR, EST AFRICAN AMERICAN: 73 mL/min — AB (ref >90)
GFR, EST NON AFRICAN AMERICAN: 63 mL/min — AB (ref >90)
GLUCOSE: 86 mg/dL (ref 70–99)
Potassium: 4.2 mEq/L (ref 3.7–5.3)
Sodium: 140 mEq/L (ref 137–147)

## 2013-12-19 MED ORDER — LISINOPRIL 5 MG PO TABS: 5.0000 mg | ORAL_TABLET | Freq: Every day | ORAL | Status: DC

## 2013-12-19 MED ORDER — CARVEDILOL 6.25 MG PO TABS: 6.2500 mg | ORAL_TABLET | Freq: Two times a day (BID) | ORAL | Status: DC

## 2013-12-19 NOTE — Patient Instructions (Addendum)
Doing well.  Increase your coreg to 6.25 mg (2 tablets) in the morning and 6.25 mg (2 tablets) in the evening. When you get a new prescription then you will have 6.25 mg tablets and then you will take 6.25 mg (1 tablet) in the morning and 6.25 mg (1 tablet) in the evening.  Start lisinopril 5 mg daily. Sent prescription to the pharmacy.  Follow up in 2 weeks with ECHO.  Refer to pulmonologist for COPD  Do the following things EVERYDAY: 1) Weigh yourself in the morning before breakfast. Write it down and keep it in a log. 2) Take your medicines as prescribed 3) Eat low salt foods-Limit salt (sodium) to 2000 mg per day.  4) Stay as active as you can everyday 5) Limit all fluids for the day to less than 2 liters 6)
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
## 2013-12-25 ENCOUNTER — Institutional Professional Consult (permissible substitution): Payer: Medicare Other | Admitting: Internal Medicine

## 2014-01-05 ENCOUNTER — Ambulatory Visit (HOSPITAL_COMMUNITY)
Admission: RE | Admit: 2014-01-05 | Discharge: 2014-01-05 | Disposition: A | Discharge status: Home / Self Care | Payer: Medicare Other | Source: Ambulatory Visit | Attending: Internal Medicine | Admitting: Internal Medicine

## 2014-01-05 ENCOUNTER — Ambulatory Visit (HOSPITAL_BASED_OUTPATIENT_CLINIC_OR_DEPARTMENT_OTHER)
Admission: RE | Admit: 2014-01-05 | Discharge: 2014-01-05 | Disposition: A | Discharge status: Home / Self Care | Payer: Medicare Other | Source: Ambulatory Visit | Attending: Cardiology | Admitting: Cardiology

## 2014-01-05 VITALS — BP 130/90 | HR 99 | Wt 203.0 lb

## 2014-01-05 DIAGNOSIS — N183 Chronic kidney disease, stage 3 (moderate): Secondary | ICD-10-CM | POA: Diagnosis not present

## 2014-01-05 DIAGNOSIS — F1910 Other psychoactive substance abuse, uncomplicated: Secondary | ICD-10-CM | POA: Diagnosis not present

## 2014-01-05 DIAGNOSIS — I129 Hypertensive chronic kidney disease with stage 1 through stage 4 chronic kidney disease, or unspecified chronic kidney disease: Secondary | ICD-10-CM | POA: Insufficient documentation

## 2014-01-05 DIAGNOSIS — R079 Chest pain, unspecified: Secondary | ICD-10-CM | POA: Diagnosis not present

## 2014-01-05 DIAGNOSIS — Z720 Tobacco use: Secondary | ICD-10-CM

## 2014-01-05 DIAGNOSIS — J449 Chronic obstructive pulmonary disease, unspecified: Secondary | ICD-10-CM | POA: Diagnosis not present

## 2014-01-05 DIAGNOSIS — I5042 Chronic combined systolic (congestive) and diastolic (congestive) heart failure: Secondary | ICD-10-CM | POA: Insufficient documentation

## 2014-01-05 DIAGNOSIS — I5022 Chronic systolic (congestive) heart failure: Secondary | ICD-10-CM

## 2014-01-05 DIAGNOSIS — F17200 Nicotine dependence, unspecified, uncomplicated: Secondary | ICD-10-CM

## 2014-01-05 DIAGNOSIS — Z7982 Long term (current) use of aspirin: Secondary | ICD-10-CM | POA: Insufficient documentation

## 2014-01-05 DIAGNOSIS — F17210 Nicotine dependence, cigarettes, uncomplicated: Secondary | ICD-10-CM | POA: Diagnosis not present

## 2014-01-05 DIAGNOSIS — R0789 Other chest pain: Secondary | ICD-10-CM

## 2014-01-05 DIAGNOSIS — I429 Cardiomyopathy, unspecified: Secondary | ICD-10-CM | POA: Diagnosis not present

## 2014-01-05 DIAGNOSIS — I10 Essential (primary) hypertension: Secondary | ICD-10-CM

## 2014-01-05 DIAGNOSIS — Z9114 Patient's other noncompliance with medication regimen: Secondary | ICD-10-CM | POA: Diagnosis not present

## 2014-01-05 DIAGNOSIS — I369 Nonrheumatic tricuspid valve disorder, unspecified: Secondary | ICD-10-CM

## 2014-01-05 MED ORDER — CARVEDILOL 6.25 MG PO TABS: 6.2500 mg | ORAL_TABLET | Freq: Two times a day (BID) | ORAL | Status: DC

## 2014-01-05 NOTE — Progress Notes (Signed)
Echo Lab  2D Echocardiogram completed.  [place], [place] 01/05/2014 2:01 PM

## 2014-01-05 NOTE — Patient Instructions (Addendum)
Increase Carvedilol/Coreg to 6.25mg  twice daily.  Take 2 tablets twice a day of the current tablets you have NOW, then once you pick up your NEW prescription you will only take 1 tablet twice daily.  Follow up 6 weeks.  Do the following things EVERYDAY: 1) Weigh yourself in the morning before breakfast. Write it down and keep it in a log. 2) Take your medicines as prescribed 3) Eat low salt foods-Limit salt (sodium) to 2000 mg per day.  4) Stay as active as you can everyday 5) Limit all fluids for the day to less than 2 liters
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
## 2014-01-21 ENCOUNTER — Institutional Professional Consult (permissible substitution): Payer: Medicare Other | Admitting: Internal Medicine

## 2014-01-22 ENCOUNTER — Institutional Professional Consult (permissible substitution): Payer: Medicare Other | Admitting: Internal Medicine

## 2014-01-30 ENCOUNTER — Institutional Professional Consult (permissible substitution): Payer: Medicare Other | Admitting: Internal Medicine

## 2014-02-16 ENCOUNTER — Encounter (HOSPITAL_COMMUNITY): Payer: Medicare Other

## 2014-07-21 ENCOUNTER — Telehealth (HOSPITAL_COMMUNITY): Payer: Self-pay

## 2014-07-21 MED ORDER — CARVEDILOL 6.25 MG PO TABS: 6.2500 mg | ORAL_TABLET | Freq: Two times a day (BID) | ORAL | Status: DC

## 2014-07-21 MED ORDER — TADALAFIL 5 MG PO TABS: 5.0000 mg | ORAL_TABLET | Freq: Every day | ORAL | Status: DC

## 2014-07-21 NOTE — Telephone Encounter (Signed)
Pt stopped by clinic to get refill on Carvedilol, he also request Cialis 5 mg daily, per Dr Haroldine Laws ok to prescribe, prescriptions sent in

## 2014-07-22 ENCOUNTER — Other Ambulatory Visit (HOSPITAL_COMMUNITY): Payer: Self-pay | Admitting: *Deleted

## 2014-07-22 MED ORDER — CARVEDILOL 6.25 MG PO TABS: 6.2500 mg | ORAL_TABLET | Freq: Two times a day (BID) | ORAL | Status: DC

## 2014-07-22 MED ORDER — TADALAFIL 5 MG PO TABS: 5.0000 mg | ORAL_TABLET | Freq: Every day | ORAL | Status: DC

## 2014-08-05 ENCOUNTER — Inpatient Hospital Stay (HOSPITAL_COMMUNITY): Admission: RE | Admit: 2014-08-05 | Payer: Medicare Other | Source: Ambulatory Visit

## 2015-01-03 DIAGNOSIS — M25562 Pain in left knee: Secondary | ICD-10-CM | POA: Diagnosis not present

## 2015-01-03 DIAGNOSIS — I10 Essential (primary) hypertension: Secondary | ICD-10-CM | POA: Diagnosis not present

## 2015-01-03 DIAGNOSIS — F17210 Nicotine dependence, cigarettes, uncomplicated: Secondary | ICD-10-CM | POA: Diagnosis not present

## 2015-02-03 ENCOUNTER — Encounter (HOSPITAL_COMMUNITY): Payer: Medicare Other | Admitting: Internal Medicine

## 2015-04-15 ENCOUNTER — Encounter (HOSPITAL_COMMUNITY): Payer: Medicare Other | Admitting: Internal Medicine

## 2015-11-22 ENCOUNTER — Ambulatory Visit (INDEPENDENT_AMBULATORY_CARE_PROVIDER_SITE_OTHER)
Admission: EM | Admit: 2015-11-22 | Discharge: 2015-11-22 | Disposition: A | Discharge status: Nursing Home (Includes ICF) | Payer: Medicare Other | Source: Home / Self Care | Attending: Family Medicine | Admitting: Family Medicine

## 2015-11-22 ENCOUNTER — Ambulatory Visit (INDEPENDENT_AMBULATORY_CARE_PROVIDER_SITE_OTHER): Payer: Medicare Other

## 2015-11-22 ENCOUNTER — Encounter (HOSPITAL_COMMUNITY): Payer: Self-pay | Admitting: Emergency Medicine

## 2015-11-22 ENCOUNTER — Inpatient Hospital Stay (HOSPITAL_COMMUNITY)
Admission: EM | Admit: 2015-11-22 | Discharge: 2015-11-27 | DRG: 291 | Disposition: A | Discharge status: Home / Self Care | Payer: Medicare Other | Attending: Internal Medicine | Admitting: Internal Medicine

## 2015-11-22 DIAGNOSIS — I959 Hypotension, unspecified: Secondary | ICD-10-CM

## 2015-11-22 DIAGNOSIS — N183 Chronic kidney disease, stage 3 unspecified: Secondary | ICD-10-CM | POA: Diagnosis present

## 2015-11-22 DIAGNOSIS — R079 Chest pain, unspecified: Secondary | ICD-10-CM | POA: Diagnosis not present

## 2015-11-22 DIAGNOSIS — Z9119 Patient's noncompliance with other medical treatment and regimen: Secondary | ICD-10-CM | POA: Diagnosis not present

## 2015-11-22 DIAGNOSIS — K219 Gastro-esophageal reflux disease without esophagitis: Secondary | ICD-10-CM | POA: Diagnosis present

## 2015-11-22 DIAGNOSIS — I700 Atherosclerosis of aorta: Secondary | ICD-10-CM | POA: Diagnosis present

## 2015-11-22 DIAGNOSIS — N179 Acute kidney failure, unspecified: Secondary | ICD-10-CM | POA: Diagnosis present

## 2015-11-22 DIAGNOSIS — R938 Abnormal findings on diagnostic imaging of other specified body structures: Secondary | ICD-10-CM | POA: Diagnosis not present

## 2015-11-22 DIAGNOSIS — I5022 Chronic systolic (congestive) heart failure: Secondary | ICD-10-CM | POA: Diagnosis not present

## 2015-11-22 DIAGNOSIS — R7989 Other specified abnormal findings of blood chemistry: Secondary | ICD-10-CM | POA: Diagnosis not present

## 2015-11-22 DIAGNOSIS — F1010 Alcohol abuse, uncomplicated: Secondary | ICD-10-CM | POA: Diagnosis present

## 2015-11-22 DIAGNOSIS — I428 Other cardiomyopathies: Secondary | ICD-10-CM | POA: Diagnosis present

## 2015-11-22 DIAGNOSIS — I2510 Atherosclerotic heart disease of native coronary artery without angina pectoris: Secondary | ICD-10-CM | POA: Diagnosis present

## 2015-11-22 DIAGNOSIS — Z87898 Personal history of other specified conditions: Secondary | ICD-10-CM

## 2015-11-22 DIAGNOSIS — I5023 Acute on chronic systolic (congestive) heart failure: Secondary | ICD-10-CM | POA: Diagnosis not present

## 2015-11-22 DIAGNOSIS — R05 Cough: Secondary | ICD-10-CM | POA: Diagnosis not present

## 2015-11-22 DIAGNOSIS — N182 Chronic kidney disease, stage 2 (mild): Secondary | ICD-10-CM | POA: Diagnosis present

## 2015-11-22 DIAGNOSIS — I248 Other forms of acute ischemic heart disease: Secondary | ICD-10-CM | POA: Diagnosis present

## 2015-11-22 DIAGNOSIS — I5043 Acute on chronic combined systolic (congestive) and diastolic (congestive) heart failure: Secondary | ICD-10-CM | POA: Diagnosis present

## 2015-11-22 DIAGNOSIS — F17210 Nicotine dependence, cigarettes, uncomplicated: Secondary | ICD-10-CM | POA: Diagnosis present

## 2015-11-22 DIAGNOSIS — Z7982 Long term (current) use of aspirin: Secondary | ICD-10-CM | POA: Diagnosis not present

## 2015-11-22 DIAGNOSIS — R9389 Abnormal findings on diagnostic imaging of other specified body structures: Secondary | ICD-10-CM

## 2015-11-22 DIAGNOSIS — I509 Heart failure, unspecified: Secondary | ICD-10-CM | POA: Diagnosis not present

## 2015-11-22 DIAGNOSIS — Z79899 Other long term (current) drug therapy: Secondary | ICD-10-CM

## 2015-11-22 DIAGNOSIS — E876 Hypokalemia: Secondary | ICD-10-CM | POA: Diagnosis present

## 2015-11-22 DIAGNOSIS — I130 Hypertensive heart and chronic kidney disease with heart failure and stage 1 through stage 4 chronic kidney disease, or unspecified chronic kidney disease: Secondary | ICD-10-CM | POA: Diagnosis not present

## 2015-11-22 DIAGNOSIS — F1910 Other psychoactive substance abuse, uncomplicated: Secondary | ICD-10-CM

## 2015-11-22 DIAGNOSIS — N189 Chronic kidney disease, unspecified: Secondary | ICD-10-CM

## 2015-11-22 DIAGNOSIS — I951 Orthostatic hypotension: Secondary | ICD-10-CM | POA: Diagnosis present

## 2015-11-22 DIAGNOSIS — R0602 Shortness of breath: Secondary | ICD-10-CM | POA: Diagnosis not present

## 2015-11-22 HISTORY — DX: Personal history of other medical treatment: Z92.89

## 2015-11-22 HISTORY — DX: Other specified postprocedural states: Z98.890

## 2015-11-22 LAB — CBC
HEMATOCRIT: 47.9 % (ref 39.0–52.0)
HEMOGLOBIN: 16 g/dL (ref 13.0–17.0)
MCH: 31.7 pg (ref 26.0–34.0)
MCHC: 33.4 g/dL (ref 30.0–36.0)
MCV: 95 fL (ref 78.0–100.0)
Platelets: 231 10*3/uL (ref 150–400)
RBC: 5.04 MIL/uL (ref 4.22–5.81)
RDW: 15.9 % — AB (ref 11.5–15.5)
WBC: 6.5 10*3/uL (ref 4.0–10.5)

## 2015-11-22 LAB — BASIC METABOLIC PANEL
ANION GAP: 11 (ref 5–15)
BUN: 15 mg/dL (ref 6–20)
CHLORIDE: 107 mmol/L (ref 101–111)
CO2: 23 mmol/L (ref 22–32)
Calcium: 9.2 mg/dL (ref 8.9–10.3)
Creatinine, Ser: 1.36 mg/dL — ABNORMAL HIGH (ref 0.61–1.24)
GFR, EST NON AFRICAN AMERICAN: 53 mL/min — AB (ref >60)
Glucose, Bld: 96 mg/dL (ref 65–99)
Potassium: 4 mmol/L (ref 3.5–5.1)
SODIUM: 141 mmol/L (ref 135–145)

## 2015-11-22 LAB — TROPONIN I: Troponin I: 0.04 ng/mL (ref <0.03)

## 2015-11-22 LAB — I-STAT TROPONIN, ED: Troponin i, poc: 0.02 ng/mL (ref 0.00–0.08)

## 2015-11-22 LAB — BRAIN NATRIURETIC PEPTIDE: B Natriuretic Peptide: 1547.3 pg/mL — ABNORMAL HIGH (ref 0.0–100.0)

## 2015-11-22 MED ORDER — NITROGLYCERIN 0.4 MG SL SUBL
0.4000 mg | SUBLINGUAL_TABLET | SUBLINGUAL | Status: DC | PRN
  Administered 2015-11-22 (×5): 0.4 mg via SUBLINGUAL
  Filled 2015-11-22 (×2): qty 1

## 2015-11-22 MED ORDER — POTASSIUM CHLORIDE CRYS ER 20 MEQ PO TBCR
20.0000 meq | EXTENDED_RELEASE_TABLET | Freq: Every day | ORAL | Status: DC
  Administered 2015-11-22 – 2015-11-27 (×6): 20 meq via ORAL
  Filled 2015-11-22 (×5): qty 1

## 2015-11-22 MED ORDER — METOLAZONE 5 MG PO TABS
5.0000 mg | ORAL_TABLET | Freq: Once | ORAL | Status: AC
  Administered 2015-11-22: 5 mg via ORAL
  Filled 2015-11-22 (×2): qty 1

## 2015-11-22 MED ORDER — HYDRALAZINE HCL 25 MG PO TABS
12.5000 mg | ORAL_TABLET | Freq: Three times a day (TID) | ORAL | Status: DC
  Administered 2015-11-22 – 2015-11-23 (×2): 12.5 mg via ORAL
  Filled 2015-11-22 (×2): qty 1

## 2015-11-22 MED ORDER — ASPIRIN 81 MG PO CHEW
324.0000 mg | CHEWABLE_TABLET | Freq: Once | ORAL | Status: AC
  Administered 2015-11-22: 324 mg via ORAL
  Filled 2015-11-22: qty 4

## 2015-11-22 MED ORDER — ENOXAPARIN SODIUM 40 MG/0.4ML ~~LOC~~ SOLN
40.0000 mg | SUBCUTANEOUS | Status: DC
  Filled 2015-11-22: qty 0.4

## 2015-11-22 MED ORDER — SODIUM CHLORIDE 0.9% FLUSH
3.0000 mL | Freq: Two times a day (BID) | INTRAVENOUS | Status: DC
  Administered 2015-11-23 – 2015-11-26 (×5): 3 mL via INTRAVENOUS

## 2015-11-22 MED ORDER — POTASSIUM CHLORIDE CRYS ER 20 MEQ PO TBCR
40.0000 meq | EXTENDED_RELEASE_TABLET | Freq: Once | ORAL | Status: DC
  Filled 2015-11-22: qty 2

## 2015-11-22 MED ORDER — SERTRALINE HCL 50 MG PO TABS
50.0000 mg | ORAL_TABLET | Freq: Every day | ORAL | Status: DC
Start: 2015-11-22 — End: 2015-11-27
  Administered 2015-11-22 – 2015-11-27 (×6): 50 mg via ORAL
  Filled 2015-11-22 (×6): qty 1

## 2015-11-22 MED ORDER — ASPIRIN EC 81 MG PO TBEC
81.0000 mg | DELAYED_RELEASE_TABLET | Freq: Every day | ORAL | Status: DC
  Administered 2015-11-22 – 2015-11-27 (×6): 81 mg via ORAL
  Filled 2015-11-22 (×6): qty 1

## 2015-11-22 MED ORDER — FUROSEMIDE 10 MG/ML IJ SOLN
40.0000 mg | Freq: Once | INTRAMUSCULAR | Status: AC
  Administered 2015-11-22: 40 mg via INTRAVENOUS
  Filled 2015-11-22: qty 4

## 2015-11-22 MED ORDER — PANTOPRAZOLE SODIUM 40 MG PO TBEC
40.0000 mg | DELAYED_RELEASE_TABLET | Freq: Every day | ORAL | Status: DC
  Administered 2015-11-22 – 2015-11-27 (×6): 40 mg via ORAL
  Filled 2015-11-22 (×6): qty 1

## 2015-11-22 MED ORDER — ZOLPIDEM TARTRATE 5 MG PO TABS
5.0000 mg | ORAL_TABLET | Freq: Every evening | ORAL | Status: DC | PRN
Start: 2015-11-22 — End: 2015-11-27
  Administered 2015-11-22 – 2015-11-26 (×5): 5 mg via ORAL
  Filled 2015-11-22 (×5): qty 1

## 2015-11-22 MED ORDER — SODIUM CHLORIDE 0.9 % IV SOLN: 250.0000 mL | INTRAVENOUS | Status: DC | PRN

## 2015-11-22 MED ORDER — LISINOPRIL 5 MG PO TABS: 5.0000 mg | ORAL_TABLET | Freq: Every day | ORAL | Status: DC

## 2015-11-22 MED ORDER — FUROSEMIDE 10 MG/ML IJ SOLN
80.0000 mg | Freq: Two times a day (BID) | INTRAMUSCULAR | Status: DC
  Administered 2015-11-23 (×2): 80 mg via INTRAVENOUS
  Filled 2015-11-22 (×2): qty 8

## 2015-11-22 MED ORDER — SODIUM CHLORIDE 0.9% FLUSH
3.0000 mL | INTRAVENOUS | Status: DC | PRN
  Administered 2015-11-23 – 2015-11-24 (×2): 3 mL via INTRAVENOUS
  Filled 2015-11-22 (×2): qty 3

## 2015-11-22 MED ORDER — ATORVASTATIN CALCIUM 40 MG PO TABS
40.0000 mg | ORAL_TABLET | Freq: Every day | ORAL | Status: DC
  Administered 2015-11-22 – 2015-11-27 (×6): 40 mg via ORAL
  Filled 2015-11-22 (×6): qty 1

## 2015-11-22 MED ORDER — ONDANSETRON HCL 4 MG/2ML IJ SOLN: 4.0000 mg | Freq: Four times a day (QID) | INTRAMUSCULAR | Status: DC | PRN

## 2015-11-22 MED ORDER — ACETAMINOPHEN 325 MG PO TABS
650.0000 mg | ORAL_TABLET | ORAL | Status: DC | PRN
  Administered 2015-11-23 (×2): 650 mg via ORAL
  Filled 2015-11-22 (×2): qty 2

## 2015-11-22 MED ORDER — ISOSORBIDE MONONITRATE ER 30 MG PO TB24
15.0000 mg | ORAL_TABLET | Freq: Every day | ORAL | Status: DC
  Administered 2015-11-22: 15 mg via ORAL
  Filled 2015-11-22: qty 1

## 2015-11-22 NOTE — ED Notes (Signed)
No Care Link available....[facility] enroute
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
## 2015-11-22 NOTE — ED Triage Notes (Signed)
Onset one month ago intermittent chest pain and shortness of breath upon exertion. Seen at Urgent Care sent to the ED for evaluation. Airway intact bilateral equal chest rise and fall.

## 2015-11-22 NOTE — ED Notes (Signed)
O2 at 4 lpm via N/C placed on patient.
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
## 2015-11-22 NOTE — ED Triage Notes (Signed)
The patient presented to the [facility] with a complaint of chest pain that he described as aching and shortness of breath x 1 week. The patient reported a previous cardiac hx.
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
## 2015-11-22 NOTE — Progress Notes (Signed)
1751 report received from [facility] ED N 1844 Pt brought in from ERD via stretcher . Pt able to ambulate to Bed ambultory with one person assist. With steady gait . Kept comfotable in bed  Safety precaution observed . Protocol and policy  Orientation done . Verbalized understanding . Cardiac monitor applied . Marland Kitchen Confirmed wuith 2 persons to CMT the patient in no distrerss

## 2015-11-23 ENCOUNTER — Inpatient Hospital Stay (HOSPITAL_COMMUNITY): Payer: Medicare Other

## 2015-11-23 ENCOUNTER — Encounter (HOSPITAL_COMMUNITY): Payer: Self-pay | Admitting: General Practice

## 2015-11-23 DIAGNOSIS — R938 Abnormal findings on diagnostic imaging of other specified body structures: Secondary | ICD-10-CM

## 2015-11-23 LAB — BASIC METABOLIC PANEL
ANION GAP: 11 (ref 5–15)
BUN: 12 mg/dL (ref 6–20)
CHLORIDE: 102 mmol/L (ref 101–111)
CO2: 25 mmol/L (ref 22–32)
Calcium: 9 mg/dL (ref 8.9–10.3)
Creatinine, Ser: 1.42 mg/dL — ABNORMAL HIGH (ref 0.61–1.24)
GFR, EST AFRICAN AMERICAN: 58 mL/min — AB (ref >60)
GFR, EST NON AFRICAN AMERICAN: 50 mL/min — AB (ref >60)
Glucose, Bld: 100 mg/dL — ABNORMAL HIGH (ref 65–99)
POTASSIUM: 3.7 mmol/L (ref 3.5–5.1)
SODIUM: 138 mmol/L (ref 135–145)

## 2015-11-23 LAB — TROPONIN I
TROPONIN I: 0.04 ng/mL — AB (ref <0.03)
Troponin I: 0.05 ng/mL (ref <0.03)

## 2015-11-23 LAB — MAGNESIUM: MAGNESIUM: 1.9 mg/dL (ref 1.7–2.4)

## 2015-11-23 MED ORDER — ISOSORBIDE MONONITRATE ER 30 MG PO TB24
30.0000 mg | ORAL_TABLET | Freq: Every day | ORAL | Status: DC
  Administered 2015-11-23 – 2015-11-24 (×2): 30 mg via ORAL
  Filled 2015-11-23 (×2): qty 1

## 2015-11-23 MED ORDER — HYDRALAZINE HCL 25 MG PO TABS
25.0000 mg | ORAL_TABLET | Freq: Three times a day (TID) | ORAL | Status: DC
  Administered 2015-11-23 – 2015-11-24 (×3): 25 mg via ORAL
  Filled 2015-11-23 (×3): qty 1

## 2015-11-23 MED ORDER — LOSARTAN POTASSIUM 25 MG PO TABS
25.0000 mg | ORAL_TABLET | Freq: Every day | ORAL | Status: DC
  Administered 2015-11-23: 25 mg via ORAL
  Filled 2015-11-23: qty 1

## 2015-11-23 NOTE — Consult Note (Addendum)
[place] Nurse wound consult note Reason for Consult: multiple open lesions Wound type:partial thickness Pressure Ulcer POA: No, not related to pressure Measurement: 0.25cm x 0.25cm x 0.1, 2 present on upper back, 3 on abd, 4 on L arm, 5 on R arm Wound [id number] Drainage (amount, consistency, odor) none noted Periwound: dry and intact Dressing procedure/placement/frequency: I have provided nurses with orders to Apply small foam dsg to pt's open lesions on both arms, abd, and back.  Change Q 3 days and prn soiling. I applied today, 9/12. We will not follow, but will remain available to this patient, to nursing, and the medical and/or surgical teams.   Fara Olden, RN-C, WTA-C Wound Treatment Associate

## 2015-11-23 NOTE — Progress Notes (Addendum)
Advanced Heart Failure Rounding Note   Subjective:    Admitted with volume overload and chest pain. Prior to admit he had been out of some of his medications.  Diuresing with IV lasix. Negative 2.6 liters. Troponin 0.04>0.04>0.05  Remains SOB with exertion. Denies chest pain.   Objective:   Weight Range:  Vital Signs:   Temp:  [97.7 F (36.5 C)-98.3 F (36.8 C)] 97.7 F (36.5 C) (09/12 0634) Pulse Rate:  [92-111] 92 (09/12 0634) Resp:  [13-32] 20 (09/12 0634) BP: (116-156)/(62-111) 148/98 (09/12 0634) SpO2:  [95 %-100 %] 99 % (09/12 0634) Weight:  [195 lb (88.5 kg)-196 lb 8 oz (89.1 kg)] 195 lb 8 oz (88.7 kg) (09/12 0634) Last BM Date: 11/22/15  Weight change: Filed Weights   11/22/15 1310 11/22/15 1827 11/23/15 0634  Weight: 195 lb (88.5 kg) 196 lb 8 oz (89.1 kg) 195 lb 8 oz (88.7 kg)    Intake/Output:   Intake/Output Summary (Last 24 hours) at 11/23/15 0815 Last data filed at 11/23/15 0700  Gross per 24 hour  Intake                0 ml  Output             2600 ml  Net            -2600 ml     Physical Exam: General:  No resp difficulty. In bed.  HEENT: normal Neck: supple. JVP to jaw . Carotids 2+ bilat; no bruits. No lymphadenopathy or thryomegaly appreciated. Cor: PMI nondisplaced. Regular rate & rhythm. No rubs, gallops or murmurs. Lungs: clear Abdomen: round, soft, nontender, nondistended. No hepatosplenomegaly. No bruits or masses. Good bowel sounds. Extremities: no cyanosis, clubbing, rash, edema Neuro: alert & orientedx3, cranial nerves grossly intact. moves all 4 extremities w/o difficulty. Affect pleasant  Telemetry: Sinus Tach 101 bpm  Labs: Basic Metabolic Panel:  Recent Labs Lab 11/22/15 1328 11/23/15 0627  NA 141 138  K 4.0 3.7  CL 107 102  CO2 23 25  GLUCOSE 96 100*  BUN 15 12  CREATININE 1.36* 1.42*  CALCIUM 9.2 9.0  MG  --  1.9    Liver Function Tests: No results for input(s): AST, ALT, ALKPHOS, BILITOT, PROT, ALBUMIN in  the last 168 hours. No results for input(s): LIPASE, AMYLASE in the last 168 hours. No results for input(s): AMMONIA in the last 168 hours.  CBC:  Recent Labs Lab 11/22/15 1328  WBC 6.5  HGB 16.0  HCT 47.9  MCV 95.0  PLT 231    Cardiac Enzymes:  Recent Labs Lab 11/22/15 1919 11/23/15 0044 11/23/15 0627  TROPONINI 0.04* 0.04* 0.05*    BNP: BNP (last 3 results)  Recent Labs  11/22/15 1328  BNP 1,547.3*    ProBNP (last 3 results) No results for input(s): PROBNP in the last 8760 hours.    Other results:  Imaging: Dg Chest 2 View  Result Date: 11/22/2015 CLINICAL DATA:  Shortness of breath for 3 weeks with productive cough, clear mucus. Patient reports tightness and soreness in the chest. Lightheaded dizzy nausea. History of CHF. EXAM: CHEST  2 VIEW COMPARISON:  11/18/2013 FINDINGS: PA and lateral views of the chest are obtained. Mild interstitial prominence in the left lung base could relate to mild fibrosis. There is a small amount of right CP angle and lateral pleural thickening/effusion with fluid along the right minor fissure. There is hazy opacification of the right lateral lung, which may relate to pleural  effusion or infiltrate. There is posterior lower airspace opacity overlying the spine on lateral view, suspicious for infiltrate. There is mild cardiomegaly, increased compared to prior with mild central congestion present. Asymmetric increased density in the right hilus. No pneumothorax. Old right upper rib fractures are again noted. IMPRESSION: 1. Small amount of left CP angle pleural effusion or thickening. Small amount of right CP angle and lateral pleural effusion or thickening with fluid or thickening extending along the right minor fissure. Vague increased opacity posterior lung base on lateral view which may relate to an infiltrate, however short interval radiographic follow-up advised to ensure clearing. 2. Interval enlargement of the cardiomediastinal  silhouette with mild central vascular congestion present. Asymmetric opacity in the right hilus could relate to engorged pulmonary artery, however short interval radiographic follow-up recommended to exclude other etiology such as adenopathy or mass. Electronically Signed   By: Donavan Foil M.D.   On: 11/22/2015 12:12      Medications:     Scheduled Medications: . aspirin EC  81 mg Oral Daily  . atorvastatin  40 mg Oral Daily  . enoxaparin (LOVENOX) injection  40 mg Subcutaneous Q24H  . furosemide  80 mg Intravenous BID  . hydrALAZINE  12.5 mg Oral Q8H  . isosorbide mononitrate  15 mg Oral Daily  . lisinopril  5 mg Oral Daily  . pantoprazole  40 mg Oral Daily  . potassium chloride SA  20 mEq Oral Daily  . potassium chloride  40 mEq Oral Once  . sertraline  50 mg Oral Daily  . sodium chloride flush  3 mL Intravenous Q12H     Infusions:     PRN Medications:  sodium chloride, acetaminophen, nitroGLYCERIN, ondansetron (ZOFRAN) IV, sodium chloride flush, zolpidem   Assessment:   1. Chest pain- normal Cors 2010 2. Acute on chronic combined HF 3. CKD stage III 4. Polysubstance abuse 5. Smoker 6. Alcohol use   Plan/Discussion:    Remains volume overloaded. Brisk diuresis noted with 80 mg IV twice daily. Continue current regimen. Off bb for now. Stop lisinopril for 36 hours wash out anticipate starting entresto tomorrow. Increase hydralazine to 25 mg tid and imdur to 30 mg daily. Renal function stable. Follow closely.   Plan to repeat ECHO. Does not have ICD. He would like to consider if EF remains < 35%.   Troponin flat. Likely demand ischemia. Most recent cath 2010.    Prior to admit he had been out some cardiac medications. He thinks it was carvedilol. Says he had lasix.  Discussed medication compliance. He will need close follow up in the HF clinic.   Length of Stay: 1   Amy Clegg NP-C  11/23/2015, 8:15 AM  Advanced Heart Failure Team Pager [phone] (M-F; 7a -  4p)  Please contact [facility] for night-coverage after hours (4p -7a ) and weekends on [email or web address]  Patient seen and examined with Darrick Grinder, NP. We discussed all aspects of the encounter. I agree with the assessment and plan as stated above.   Moderate improvement with IV diuresis. Chest pain resolved. Still volume overloaded. Will continue to diurese. Troponins flat so doubt ACS. BP elevated. Will start losartan while bridging to Entresto.  Repeat echo today. Low threshold to proceed with R/L cath. Creatinine up slightly - follow closely. Electrolytes ok.   Given abnormal CXR will proceed with CT chest (no contrast) to evaluate. Watch renal function.   Parthenia Tellefsen,MD 9:41 AM

## 2015-11-23 NOTE — Progress Notes (Signed)
I cosign Ryan [name]'s assessment, I's & O's, medication administration, and progress notes. 

## 2015-11-24 ENCOUNTER — Inpatient Hospital Stay (HOSPITAL_COMMUNITY): Payer: Medicare Other

## 2015-11-24 DIAGNOSIS — I509 Heart failure, unspecified: Secondary | ICD-10-CM

## 2015-11-24 LAB — BASIC METABOLIC PANEL
Anion gap: 14 (ref 5–15)
BUN: 21 mg/dL — ABNORMAL HIGH (ref 6–20)
CHLORIDE: 96 mmol/L — AB (ref 101–111)
CO2: 25 mmol/L (ref 22–32)
CREATININE: 1.86 mg/dL — AB (ref 0.61–1.24)
Calcium: 9.6 mg/dL (ref 8.9–10.3)
GFR calc non Af Amer: 36 mL/min — ABNORMAL LOW (ref >60)
GFR, EST AFRICAN AMERICAN: 42 mL/min — AB (ref >60)
GLUCOSE: 112 mg/dL — AB (ref 65–99)
Potassium: 3.1 mmol/L — ABNORMAL LOW (ref 3.5–5.1)
Sodium: 135 mmol/L (ref 135–145)

## 2015-11-24 LAB — ECHOCARDIOGRAM COMPLETE
Height: 71 in
WEIGHTICAEL: 3027.2 [oz_av]

## 2015-11-24 MED ORDER — SODIUM CHLORIDE 0.9% FLUSH: 3.0000 mL | INTRAVENOUS | Status: DC | PRN

## 2015-11-24 MED ORDER — POTASSIUM CHLORIDE CRYS ER 20 MEQ PO TBCR
20.0000 meq | EXTENDED_RELEASE_TABLET | Freq: Once | ORAL | Status: AC
  Administered 2015-11-24: 20 meq via ORAL
  Filled 2015-11-24: qty 1

## 2015-11-24 MED ORDER — SODIUM CHLORIDE 0.9 % IV SOLN
INTRAVENOUS | Status: DC
  Administered 2015-11-25: 07:00:00 via INTRAVENOUS

## 2015-11-24 MED ORDER — SODIUM CHLORIDE 0.9 % IV BOLUS (SEPSIS)
250.0000 mL | Freq: Once | INTRAVENOUS | Status: AC
Start: 2015-11-24 — End: 2015-11-24
  Administered 2015-11-24: 250 mL via INTRAVENOUS

## 2015-11-24 MED ORDER — SODIUM CHLORIDE 0.9 % IV BOLUS (SEPSIS): 250.0000 mL | Freq: Once | INTRAVENOUS | Status: DC

## 2015-11-24 MED ORDER — SODIUM CHLORIDE 0.9 % IV SOLN: 250.0000 mL | INTRAVENOUS | Status: DC | PRN

## 2015-11-24 MED ORDER — SODIUM CHLORIDE 0.9% FLUSH: 3.0000 mL | Freq: Two times a day (BID) | INTRAVENOUS | Status: DC

## 2015-11-24 NOTE — Progress Notes (Signed)
Advanced Heart Failure Rounding Note   Subjective:    Admitted with volume overload and chest pain. Prior to admit he had been out of some of his medications.  Diuresing with IV lasix. I/O not accurate. Weight down another 6 pounds. Yesterday he was started on losartan.  Troponin 0.04>0.04>0.05   Complaining of dizziness and fatigue. Denies SOB/CP    Objective:   Weight Range:  Vital Signs:   Temp:  [97.4 F (36.3 C)-98.7 F (37.1 C)] 98.6 F (37 C) (09/13 0745) Pulse Rate:  [82-94] 87 (09/13 0745) Resp:  [15-18] 15 (09/13 0745) BP: (95-122)/(44-83) 95/60 (09/13 0745) SpO2:  [98 %-100 %] 100 % (09/13 0745) Weight:  [189 lb 3.2 oz (85.8 kg)] 189 lb 3.2 oz (85.8 kg) (09/13 0514) Last BM Date: 11/23/15  Weight change: Filed Weights   11/22/15 1827 11/23/15 0634 11/24/15 0514  Weight: 196 lb 8 oz (89.1 kg) 195 lb 8 oz (88.7 kg) 189 lb 3.2 oz (85.8 kg)    Intake/Output:   Intake/Output Summary (Last 24 hours) at 11/24/15 0806 Last data filed at 11/24/15 [id number]  Gross per 24 hour  Intake             2280 ml  Output             2770 ml  Net             -490 ml   Sitting SBP 92 Standing SBP 78 Physical Exam: General:  No resp difficulty. In the chair.   HEENT: normal Neck: supple. JVP 5-6 . Carotids 2+ bilat; no bruits. No lymphadenopathy or thryomegaly appreciated. Cor: PMI nondisplaced. Regular rate & rhythm. No rubs, gallops or murmurs. Lungs: clear Abdomen: round, soft, nontender, nondistended. No hepatosplenomegaly. No bruits or masses. Good bowel sounds. Extremities: no cyanosis, clubbing, rash, edema Neuro: alert & orientedx3, cranial nerves grossly intact. moves all 4 extremities w/o difficulty. Affect pleasant  Telemetry: Sinus  Rhythm 80s  Labs: Basic Metabolic Panel:  Recent Labs Lab 11/22/15 1328 11/23/15 0627 11/24/15 0258  NA 141 138 135  K 4.0 3.7 3.1*  CL 107 102 96*  CO2 23 25 25   GLUCOSE 96 100* 112*  BUN 15 12 21*  CREATININE 1.36*  1.42* 1.86*  CALCIUM 9.2 9.0 9.6  MG  --  1.9  --     Liver Function Tests: No results for input(s): AST, ALT, ALKPHOS, BILITOT, PROT, ALBUMIN in the last 168 hours. No results for input(s): LIPASE, AMYLASE in the last 168 hours. No results for input(s): AMMONIA in the last 168 hours.  CBC:  Recent Labs Lab 11/22/15 1328  WBC 6.5  HGB 16.0  HCT 47.9  MCV 95.0  PLT 231    Cardiac Enzymes:  Recent Labs Lab 11/22/15 1919 11/23/15 0044 11/23/15 0627  TROPONINI 0.04* 0.04* 0.05*    BNP: BNP (last 3 results)  Recent Labs  11/22/15 1328  BNP 1,547.3*    ProBNP (last 3 results) No results for input(s): PROBNP in the last 8760 hours.    Other results:  Imaging: Dg Chest 2 View  Result Date: 11/22/2015 CLINICAL DATA:  Shortness of breath for 3 weeks with productive cough, clear mucus. Patient reports tightness and soreness in the chest. Lightheaded dizzy nausea. History of CHF. EXAM: CHEST  2 VIEW COMPARISON:  11/18/2013 FINDINGS: PA and lateral views of the chest are obtained. Mild interstitial prominence in the left lung base could relate to mild fibrosis. There is a small amount  of right CP angle and lateral pleural thickening/effusion with fluid along the right minor fissure. There is hazy opacification of the right lateral lung, which may relate to pleural effusion or infiltrate. There is posterior lower airspace opacity overlying the spine on lateral view, suspicious for infiltrate. There is mild cardiomegaly, increased compared to prior with mild central congestion present. Asymmetric increased density in the right hilus. No pneumothorax. Old right upper rib fractures are again noted. IMPRESSION: 1. Small amount of left CP angle pleural effusion or thickening. Small amount of right CP angle and lateral pleural effusion or thickening with fluid or thickening extending along the right minor fissure. Vague increased opacity posterior lung base on lateral view which may  relate to an infiltrate, however short interval radiographic follow-up advised to ensure clearing. 2. Interval enlargement of the cardiomediastinal silhouette with mild central vascular congestion present. Asymmetric opacity in the right hilus could relate to engorged pulmonary artery, however short interval radiographic follow-up recommended to exclude other etiology such as adenopathy or mass. Electronically Signed   By: Donavan Foil M.D.   On: 11/22/2015 12:12   Ct Chest Wo Contrast  Result Date: 11/23/2015 CLINICAL DATA:  Chest pain. Volume overload. Right hilar prominence on chest radiograph. EXAM: CT CHEST WITHOUT CONTRAST TECHNIQUE: Multidetector CT imaging of the chest was performed following the standard protocol without IV contrast. COMPARISON:  Chest radiograph from one day prior. 04/30/2010 chest CT angiogram. FINDINGS: Cardiovascular: Mild cardiomegaly. No significant pericardial fluid/thickening. Left circumflex coronary atherosclerosis. Atherosclerotic nonaneurysmal thoracic aorta. Normal caliber pulmonary arteries. Mediastinum/Nodes: No discrete thyroid nodules. Unremarkable esophagus. No pathologically enlarged axillary, mediastinal or gross hilar lymph nodes, noting limited sensitivity for the detection of hilar adenopathy on this noncontrast study. Lungs/Pleura: No pneumothorax. Trace right pleural effusion. No left pleural effusion. Calcified subcentimeter right middle lobe granuloma. No acute consolidative airspace disease, lung masses or significant pulmonary nodules. There several parenchymal bands in the basilar lower lobes, not appreciably changed back to 2012, consistent with postinfectious/ postinflammatory scarring. No significant interlobular septal thickening. Upper abdomen: Unremarkable. Musculoskeletal: No aggressive appearing focal osseous lesions. There are healed deformities in several right ribs. IMPRESSION: 1. Mild cardiomegaly without CT findings of pulmonary edema. 2.  Trace right pleural effusion. 3. Bibasilar lung scarring.  No active pulmonary disease. 4. Aortic atherosclerosis.  One vessel coronary atherosclerosis. Electronically Signed   By: Ilona Sorrel M.D.   On: 11/23/2015 11:10     Medications:     Scheduled Medications: . aspirin EC  81 mg Oral Daily  . atorvastatin  40 mg Oral Daily  . enoxaparin (LOVENOX) injection  40 mg Subcutaneous Q24H  . furosemide  80 mg Intravenous BID  . hydrALAZINE  25 mg Oral Q8H  . isosorbide mononitrate  30 mg Oral Daily  . losartan  25 mg Oral Daily  . pantoprazole  40 mg Oral Daily  . potassium chloride SA  20 mEq Oral Daily  . potassium chloride  40 mEq Oral Once  . sertraline  50 mg Oral Daily  . sodium chloride flush  3 mL Intravenous Q12H    Infusions:    PRN Medications: sodium chloride, acetaminophen, nitroGLYCERIN, ondansetron (ZOFRAN) IV, sodium chloride flush, zolpidem   Assessment:   1. Chest pain- normal Cors 2010 2. Acute on chronic combined HF 3. AKI/CKD stage III 4. Polysubstance abuse 5. Smoker 6. Alcohol use 7. Orthostatic Hypotension 8. Hypokalemia   Plan/Discussion:   Today he is orthostatic and symptomatic. I personally checked orthostatics. Over diuresed.  I dont think this is cardiogenic shock. Extremities warm and on exam he appears dry. Creatinine bump noted. Stop IV lasix and losartan. Give 500 cc NS.  K supplemented. Hold off on bb for now. Can give hydralazine/imdur if SBP> 90. Repeat BMET in am.   ECHO this morning. Does not have ICD. He would like to consider if EF remains < 35%.   Troponin flat. Likely demand ischemia. Most recent cath 2010 wit normal cors at that time.  CT of chest with one vessel coronary atherosclerosis. No chest pain today. May need LHC later but will need to hold off with elevated creatinine. On statin + aspirin.   Prior to admit he had been out some cardiac medications. He thinks it was carvedilol. Says he had lasix.  Discussed medication  compliance. He will need close follow up in the HF clinic.   Length of Stay: 2   Amy Clegg NP-C  11/24/2015, 8:06 AM  Advanced Heart Failure Team Pager [phone] (M-F; 7a - 4p)  Please contact [facility] for night-coverage after hours (4p -7a ) and weekends on [email or web address]  Patient seen and examined with Darrick Grinder, NP. We discussed all aspects of the encounter. I agree with the assessment and plan as stated above.   He is orthostatic and renal function worse. Agree with IVF bolus.  Echo reviewed personally. LVEF 15.20% markedly dilated. RV moderately reduced. Will need R/L cath tomorrow if renal function stable.   Not ICD candidate currently given uncertain prognosis.   Archit Leger,MD 9:57 AM

## 2015-11-24 NOTE — Care Management Note (Signed)
Case Management Note  Patient Details  Name: Ryan [name] MRN: [id number] Date of Birth: Jan 10, 1951  Subjective/Objective:   Pt lives with son, states he plans to f/u with heart failure clinic when discharged.  Discussed benefit of home health RN to assist with heart failure management.  Pt declines, states he will possibly agree in the future if he feels a need.                    Expected Discharge Plan:  Home/Self Care  Discharge planning Services  CM Consult  HH Arranged:  Patient Refused [facility] Agency:     Status of Service:  Completed, signed off  Girard Cooter, [place] 11/24/2015, 11:56 AM

## 2015-11-24 NOTE — Progress Notes (Signed)
Patient is alert in bed. Blood is currently 70/50 manually. MD is aware and IV fluids have been started at a bolus rate

## 2015-11-24 NOTE — Progress Notes (Signed)
Updated heart team about current blood pressure, PA stated ok to give IMDUR 30 mg as scheduled.

## 2015-11-24 NOTE — Progress Notes (Signed)
  Echocardiogram 2D Echocardiogram has been performed.  Jennette Dubin 11/24/2015, 9:32 AM

## 2015-11-24 NOTE — Progress Notes (Signed)
Paged for Pressures in 70/50s lying in bed.  Asymptomatic.     Discussed with MD. Hold Imdur/Hydral for now.  Repeat 250 mL of fluid.   Will continue to follow closely.     Legrand Como [address] [place], PA-C 11/24/2015 2:29 PM

## 2015-11-25 ENCOUNTER — Encounter (HOSPITAL_COMMUNITY): Payer: Self-pay | Admitting: Internal Medicine

## 2015-11-25 ENCOUNTER — Encounter (HOSPITAL_COMMUNITY)
Admission: EM | Disposition: A | Discharge status: Home / Self Care | Payer: Self-pay | Source: Home / Self Care | Attending: Internal Medicine

## 2015-11-25 DIAGNOSIS — I951 Orthostatic hypotension: Secondary | ICD-10-CM

## 2015-11-25 DIAGNOSIS — N179 Acute kidney failure, unspecified: Secondary | ICD-10-CM

## 2015-11-25 HISTORY — PX: CARDIAC CATHETERIZATION: SHX172

## 2015-11-25 LAB — CBC
HEMATOCRIT: 52.5 % — AB (ref 39.0–52.0)
HEMOGLOBIN: 17.5 g/dL — AB (ref 13.0–17.0)
MCH: 31.7 pg (ref 26.0–34.0)
MCHC: 33.3 g/dL (ref 30.0–36.0)
MCV: 95.1 fL (ref 78.0–100.0)
Platelets: 205 10*3/uL (ref 150–400)
RBC: 5.52 MIL/uL (ref 4.22–5.81)
RDW: 15.1 % (ref 11.5–15.5)
WBC: 7.3 10*3/uL (ref 4.0–10.5)

## 2015-11-25 LAB — POCT I-STAT 3, ART BLOOD GAS (G3+)
Acid-base deficit: 1 mmol/L (ref 0.0–2.0)
Bicarbonate: 24 mmol/L (ref 20.0–28.0)
O2 Saturation: 97 %
PCO2 ART: 39.1 mmHg (ref 32.0–48.0)
PO2 ART: 87 mmHg (ref 83.0–108.0)
TCO2: 25 mmol/L (ref 0–100)
pH, Arterial: 7.395 (ref 7.350–7.450)

## 2015-11-25 LAB — BASIC METABOLIC PANEL
ANION GAP: 9 (ref 5–15)
BUN: 24 mg/dL — ABNORMAL HIGH (ref 6–20)
CALCIUM: 9.3 mg/dL (ref 8.9–10.3)
CO2: 27 mmol/L (ref 22–32)
CREATININE: 1.57 mg/dL — AB (ref 0.61–1.24)
Chloride: 100 mmol/L — ABNORMAL LOW (ref 101–111)
GFR, EST AFRICAN AMERICAN: 51 mL/min — AB (ref >60)
GFR, EST NON AFRICAN AMERICAN: 44 mL/min — AB (ref >60)
Glucose, Bld: 105 mg/dL — ABNORMAL HIGH (ref 65–99)
Potassium: 3.6 mmol/L (ref 3.5–5.1)
SODIUM: 136 mmol/L (ref 135–145)

## 2015-11-25 LAB — PROTIME-INR
INR: 1.02
PROTHROMBIN TIME: 13.5 s (ref 11.4–15.2)

## 2015-11-25 LAB — POCT I-STAT 3, VENOUS BLOOD GAS (G3P V)
Acid-Base Excess: 1 mmol/L (ref 0.0–2.0)
Bicarbonate: 27.3 mmol/L (ref 20.0–28.0)
O2 Saturation: 58 %
PCO2 VEN: 48 mmHg (ref 44.0–60.0)
PH VEN: 7.363 (ref 7.250–7.430)
TCO2: 29 mmol/L (ref 0–100)
pO2, Ven: 32 mmHg (ref 32.0–45.0)

## 2015-11-25 LAB — CREATININE, SERUM
Creatinine, Ser: 1.33 mg/dL — ABNORMAL HIGH (ref 0.61–1.24)
GFR calc non Af Amer: 54 mL/min — ABNORMAL LOW (ref >60)

## 2015-11-25 SURGERY — RIGHT/LEFT HEART CATH AND CORONARY ANGIOGRAPHY
Anesthesia: LOCAL

## 2015-11-25 MED ORDER — SODIUM CHLORIDE 0.9% FLUSH
3.0000 mL | Freq: Two times a day (BID) | INTRAVENOUS | Status: DC
  Administered 2015-11-26 – 2015-11-27 (×2): 3 mL via INTRAVENOUS

## 2015-11-25 MED ORDER — SODIUM CHLORIDE 0.9 % IV SOLN: INTRAVENOUS | Status: AC

## 2015-11-25 MED ORDER — SODIUM CHLORIDE 0.9% FLUSH: 3.0000 mL | INTRAVENOUS | Status: DC | PRN

## 2015-11-25 MED ORDER — HEPARIN SODIUM (PORCINE) 1000 UNIT/ML IJ SOLN
INTRAMUSCULAR | Status: AC
  Filled 2015-11-25: qty 1

## 2015-11-25 MED ORDER — SODIUM CHLORIDE 0.9 % IV SOLN: 250.0000 mL | INTRAVENOUS | Status: DC | PRN

## 2015-11-25 MED ORDER — HEPARIN (PORCINE) IN NACL 2-0.9 UNIT/ML-% IJ SOLN
INTRAMUSCULAR | Status: DC | PRN
  Administered 2015-11-25: 1000 mL

## 2015-11-25 MED ORDER — ONDANSETRON HCL 4 MG/2ML IJ SOLN: 4.0000 mg | Freq: Four times a day (QID) | INTRAMUSCULAR | Status: DC | PRN

## 2015-11-25 MED ORDER — LIDOCAINE HCL (PF) 1 % IJ SOLN
INTRAMUSCULAR | Status: AC
Start: 2015-11-25 — End: 2015-11-25
  Filled 2015-11-25: qty 30

## 2015-11-25 MED ORDER — MIDAZOLAM HCL 2 MG/2ML IJ SOLN
INTRAMUSCULAR | Status: DC | PRN
  Administered 2015-11-25 (×2): 1 mg via INTRAVENOUS

## 2015-11-25 MED ORDER — MIDAZOLAM HCL 2 MG/2ML IJ SOLN
INTRAMUSCULAR | Status: AC
  Filled 2015-11-25: qty 2

## 2015-11-25 MED ORDER — ACETAMINOPHEN 325 MG PO TABS: 650.0000 mg | ORAL_TABLET | ORAL | Status: DC | PRN

## 2015-11-25 MED ORDER — FENTANYL CITRATE (PF) 100 MCG/2ML IJ SOLN
INTRAMUSCULAR | Status: AC
  Filled 2015-11-25: qty 2

## 2015-11-25 MED ORDER — HEPARIN (PORCINE) IN NACL 2-0.9 UNIT/ML-% IJ SOLN
INTRAMUSCULAR | Status: AC
  Filled 2015-11-25: qty 1000

## 2015-11-25 MED ORDER — LIDOCAINE HCL (PF) 1 % IJ SOLN
INTRAMUSCULAR | Status: DC | PRN
  Administered 2015-11-25: 2 mL
  Administered 2015-11-25: 16 mL
  Administered 2015-11-25: 2 mL

## 2015-11-25 MED ORDER — ENOXAPARIN SODIUM 40 MG/0.4ML ~~LOC~~ SOLN
40.0000 mg | SUBCUTANEOUS | Status: DC
  Filled 2015-11-25 (×2): qty 0.4

## 2015-11-25 MED ORDER — FENTANYL CITRATE (PF) 100 MCG/2ML IJ SOLN
INTRAMUSCULAR | Status: DC | PRN
  Administered 2015-11-25: 50 ug via INTRAVENOUS
  Administered 2015-11-25 (×2): 25 ug via INTRAVENOUS

## 2015-11-25 MED ORDER — VERAPAMIL HCL 2.5 MG/ML IV SOLN
INTRAVENOUS | Status: AC
  Filled 2015-11-25: qty 2

## 2015-11-25 MED ORDER — IOPAMIDOL (ISOVUE-370) INJECTION 76%
INTRAVENOUS | Status: AC
  Filled 2015-11-25: qty 100

## 2015-11-25 MED ORDER — IOPAMIDOL (ISOVUE-370) INJECTION 76%
INTRAVENOUS | Status: DC | PRN
Start: 2015-11-25 — End: 2015-11-25
  Administered 2015-11-25: 30 mL via INTRA_ARTERIAL

## 2015-11-25 SURGICAL SUPPLY — 14 items
CATH INFINITI 4FR JL 4.0 (CATHETERS) ×1 IMPLANT
CATH INFINITI JR4 5F (CATHETERS) ×1 IMPLANT
CATH SWAN GANZ 7F STRAIGHT (CATHETERS) ×1 IMPLANT
GLIDESHEATH SLEND SS 6F .021 (SHEATH) ×1 IMPLANT
GUIDEWIRE .025 260CM (WIRE) ×1 IMPLANT
KIT HEART LEFT (KITS) ×2 IMPLANT
PACK CARDIAC CATHETERIZATION (CUSTOM PROCEDURE TRAY) ×2 IMPLANT
SHEATH FAST CATH BRACH 5F 5CM (SHEATH) ×1 IMPLANT
SHEATH PINNACLE 5F 10CM (SHEATH) ×1 IMPLANT
SHEATH PINNACLE 7F 10CM (SHEATH) ×1 IMPLANT
TRANSDUCER W/STOPCOCK (MISCELLANEOUS) ×3 IMPLANT
TUBING CIL FLEX 10 FLL-RA (TUBING) ×1 IMPLANT
WIRE HI TORQ VERSACORE-J 145CM (WIRE) ×1 IMPLANT
WIRE SAFE-T 1.5MM-J .035X260CM (WIRE) ×1 IMPLANT

## 2015-11-25 NOTE — Progress Notes (Signed)
Site area:Right groin a 5 french arterial and a 7 french venous sheath was removed by Fabian Sharp RN from [phone]  Site Prior to Removal:  Level 0  Pressure Applied For 15 MINUTES    Bedrest Beginning at 0920am  Manual:   Yes.    Patient Status During Pull:  stable  Post Pull Groin Site:  Level 0  Post Pull Instructions Given:  Yes.    Post Pull Pulses Present:  Yes.    Dressing Applied:  Yes.    Comments:  VS remain stable during sheath pull.

## 2015-11-25 NOTE — Progress Notes (Signed)
Advanced Heart Failure Rounding Note   Subjective:    Yesterday developed hypotension. Diuretics held. Given several fluid boluses. Feels better today. Renal function improving. Feels weak but denies SOB, orthopnea.   Admits to drinking a pint a day at home   Objective:   Weight Range:  Vital Signs:   Temp:  [97.6 F (36.4 C)-98 F (36.7 C)] 97.6 F (36.4 C) (09/14 0500) Pulse Rate:  [77-96] 77 (09/14 0500) Resp:  [17-20] 20 (09/14 0500) BP: (70-114)/(44-80) 114/65 (09/14 0500) SpO2:  [99 %-100 %] 100 % (09/14 0500) Weight:  [87.5 kg (192 lb 12.8 oz)] 87.5 kg (192 lb 12.8 oz) (09/14 0500) Last BM Date: 11/24/15  Weight change: Filed Weights   11/23/15 0634 11/24/15 0514 11/25/15 0500  Weight: 88.7 kg (195 lb 8 oz) 85.8 kg (189 lb 3.2 oz) 87.5 kg (192 lb 12.8 oz)    Intake/Output:   Intake/Output Summary (Last 24 hours) at 11/25/15 0751 Last data filed at 11/25/15 [id number]  Gross per 24 hour  Intake             1136 ml  Output              750 ml  Net              386 ml    Physical Exam: General:  No resp difficulty. On bed HEENT: normal x for poor dentition  Neck: supple. JVP 7 . Carotids 2+ bilat; no bruits. No lymphadenopathy or thryomegaly appreciated. Cor: PMI laterally displaced. Regular rate & rhythm. No rubs, gallops or murmurs. Lungs: clear Abdomen: round, soft, nontender, nondistended. No hepatosplenomegaly. No bruits or masses. Good bowel sounds. Extremities: no cyanosis, clubbing, rash, edema. Leg scar Neuro: alert & orientedx3, cranial nerves grossly intact. moves all 4 extremities w/o difficulty. Affect pleasant  Telemetry: Sinus  Rhythm 90-110s  Labs: Basic Metabolic Panel:  Recent Labs Lab 11/22/15 1328 11/23/15 0627 11/24/15 0258 11/25/15 0608  NA 141 138 135 136  K 4.0 3.7 3.1* 3.6  CL 107 102 96* 100*  CO2 23 25 25 27   GLUCOSE 96 100* 112* 105*  BUN 15 12 21* 24*  CREATININE 1.36* 1.42* 1.86* 1.57*  CALCIUM 9.2 9.0 9.6 9.3  MG   --  1.9  --   --     Liver Function Tests: No results for input(s): AST, ALT, ALKPHOS, BILITOT, PROT, ALBUMIN in the last 168 hours. No results for input(s): LIPASE, AMYLASE in the last 168 hours. No results for input(s): AMMONIA in the last 168 hours.  CBC:  Recent Labs Lab 11/22/15 1328  WBC 6.5  HGB 16.0  HCT 47.9  MCV 95.0  PLT 231    Cardiac Enzymes:  Recent Labs Lab 11/22/15 1919 11/23/15 0044 11/23/15 0627  TROPONINI 0.04* 0.04* 0.05*    BNP: BNP (last 3 results)  Recent Labs  11/22/15 1328  BNP 1,547.3*    ProBNP (last 3 results) No results for input(s): PROBNP in the last 8760 hours.    Other results:  Imaging: Ct Chest Wo Contrast  Result Date: 11/23/2015 CLINICAL DATA:  Chest pain. Volume overload. Right hilar prominence on chest radiograph. EXAM: CT CHEST WITHOUT CONTRAST TECHNIQUE: Multidetector CT imaging of the chest was performed following the standard protocol without IV contrast. COMPARISON:  Chest radiograph from one day prior. 04/30/2010 chest CT angiogram. FINDINGS: Cardiovascular: Mild cardiomegaly. No significant pericardial fluid/thickening. Left circumflex coronary atherosclerosis. Atherosclerotic nonaneurysmal thoracic aorta. Normal caliber pulmonary arteries. Mediastinum/Nodes: No  discrete thyroid nodules. Unremarkable esophagus. No pathologically enlarged axillary, mediastinal or gross hilar lymph nodes, noting limited sensitivity for the detection of hilar adenopathy on this noncontrast study. Lungs/Pleura: No pneumothorax. Trace right pleural effusion. No left pleural effusion. Calcified subcentimeter right middle lobe granuloma. No acute consolidative airspace disease, lung masses or significant pulmonary nodules. There several parenchymal bands in the basilar lower lobes, not appreciably changed back to 2012, consistent with postinfectious/ postinflammatory scarring. No significant interlobular septal thickening. Upper abdomen:  Unremarkable. Musculoskeletal: No aggressive appearing focal osseous lesions. There are healed deformities in several right ribs. IMPRESSION: 1. Mild cardiomegaly without CT findings of pulmonary edema. 2. Trace right pleural effusion. 3. Bibasilar lung scarring.  No active pulmonary disease. 4. Aortic atherosclerosis.  One vessel coronary atherosclerosis. Electronically Signed   By: Ilona Sorrel M.D.   On: 11/23/2015 11:10     Medications:     Scheduled Medications: . [MAR Hold] aspirin EC  81 mg Oral Daily  . [MAR Hold] atorvastatin  40 mg Oral Daily  . [MAR Hold] enoxaparin (LOVENOX) injection  40 mg Subcutaneous Q24H  . [MAR Hold] pantoprazole  40 mg Oral Daily  . [MAR Hold] potassium chloride SA  20 mEq Oral Daily  . [MAR Hold] sertraline  50 mg Oral Daily  . [MAR Hold] sodium chloride  250 mL Intravenous Once  . [MAR Hold] sodium chloride flush  3 mL Intravenous Q12H  . sodium chloride flush  3 mL Intravenous Q12H    Infusions: . sodium chloride 10 mL/hr at 11/25/15 0740    PRN Medications: [MAR Hold] sodium chloride, sodium chloride, [MAR Hold] acetaminophen, midazolam, [MAR Hold] nitroGLYCERIN, [MAR Hold] ondansetron (ZOFRAN) IV, [MAR Hold] sodium chloride flush, sodium chloride flush, [MAR Hold] zolpidem   Assessment:   1. Chest pain- normal Cors 2010 2. Acute on chronic combined HF 3. AKI/CKD stage III 4. Polysubstance abuse 5. Smoker 6. Alcohol use 7. Orthostatic Hypotension 8. Hypokalemia 9. Hypotension    Plan/Discussion:    Wilburn Mylar was orthostatic and hypotensive. Received several IVF boluses. Feels better today. Diuretics and hydralazine on hold. Renal function improved.   Echo reviewed personally. LVEF 15.20% markedly dilated. RV moderately reduced. Suspect ETOH CM but may have component of CAD  R/L cath today to assess for CAD and low output. Counseled on need for ETOH cessation. Watch for DTs.   Not ICD candidate currently given uncertain prognosis.    Bensimhon, Daniel,MD 7:51 AM Advanced Heart Failure Team Pager [phone] (M-F; 7a - 4p)  Please contact [facility] for night-coverage after hours (4p -7a ) and weekends on [email or web address]

## 2015-11-25 NOTE — Progress Notes (Signed)
Pt ambulated to BR w/ stand-by assist, tolerated well, dressing removed to rt groin, small amount of blood noted on dressing, bandaid applied, will continue to monitor.

## 2015-11-25 NOTE — H&P (View-Only) (Signed)
Advanced Heart Failure Rounding Note   Subjective:    Yesterday developed hypotension. Diuretics held. Given several fluid boluses. Feels better today. Renal function improving. Feels weak but denies SOB, orthopnea.   Admits to drinking a pint a day at home   Objective:   Weight Range:  Vital Signs:   Temp:  [97.6 F (36.4 C)-98 F (36.7 C)] 97.6 F (36.4 C) (09/14 0500) Pulse Rate:  [77-96] 77 (09/14 0500) Resp:  [17-20] 20 (09/14 0500) BP: (70-114)/(44-80) 114/65 (09/14 0500) SpO2:  [99 %-100 %] 100 % (09/14 0500) Weight:  [87.5 kg (192 lb 12.8 oz)] 87.5 kg (192 lb 12.8 oz) (09/14 0500) Last BM Date: 11/24/15  Weight change: Filed Weights   11/23/15 0634 11/24/15 0514 11/25/15 0500  Weight: 88.7 kg (195 lb 8 oz) 85.8 kg (189 lb 3.2 oz) 87.5 kg (192 lb 12.8 oz)    Intake/Output:   Intake/Output Summary (Last 24 hours) at 11/25/15 0751 Last data filed at 11/25/15 [id number]  Gross per 24 hour  Intake             1136 ml  Output              750 ml  Net              386 ml    Physical Exam: General:  No resp difficulty. On bed HEENT: normal x for poor dentition  Neck: supple. JVP 7 . Carotids 2+ bilat; no bruits. No lymphadenopathy or thryomegaly appreciated. Cor: PMI laterally displaced. Regular rate & rhythm. No rubs, gallops or murmurs. Lungs: clear Abdomen: round, soft, nontender, nondistended. No hepatosplenomegaly. No bruits or masses. Good bowel sounds. Extremities: no cyanosis, clubbing, rash, edema. Leg scar Neuro: alert & orientedx3, cranial nerves grossly intact. moves all 4 extremities w/o difficulty. Affect pleasant  Telemetry: Sinus  Rhythm 90-110s  Labs: Basic Metabolic Panel:  Recent Labs Lab 11/22/15 1328 11/23/15 0627 11/24/15 0258 11/25/15 0608  NA 141 138 135 136  K 4.0 3.7 3.1* 3.6  CL 107 102 96* 100*  CO2 23 25 25 27   GLUCOSE 96 100* 112* 105*  BUN 15 12 21* 24*  CREATININE 1.36* 1.42* 1.86* 1.57*  CALCIUM 9.2 9.0 9.6 9.3  MG   --  1.9  --   --     Liver Function Tests: No results for input(s): AST, ALT, ALKPHOS, BILITOT, PROT, ALBUMIN in the last 168 hours. No results for input(s): LIPASE, AMYLASE in the last 168 hours. No results for input(s): AMMONIA in the last 168 hours.  CBC:  Recent Labs Lab 11/22/15 1328  WBC 6.5  HGB 16.0  HCT 47.9  MCV 95.0  PLT 231    Cardiac Enzymes:  Recent Labs Lab 11/22/15 1919 11/23/15 0044 11/23/15 0627  TROPONINI 0.04* 0.04* 0.05*    BNP: BNP (last 3 results)  Recent Labs  11/22/15 1328  BNP 1,547.3*    ProBNP (last 3 results) No results for input(s): PROBNP in the last 8760 hours.    Other results:  Imaging: Ct Chest Wo Contrast  Result Date: 11/23/2015 CLINICAL DATA:  Chest pain. Volume overload. Right hilar prominence on chest radiograph. EXAM: CT CHEST WITHOUT CONTRAST TECHNIQUE: Multidetector CT imaging of the chest was performed following the standard protocol without IV contrast. COMPARISON:  Chest radiograph from one day prior. 04/30/2010 chest CT angiogram. FINDINGS: Cardiovascular: Mild cardiomegaly. No significant pericardial fluid/thickening. Left circumflex coronary atherosclerosis. Atherosclerotic nonaneurysmal thoracic aorta. Normal caliber pulmonary arteries. Mediastinum/Nodes: No  discrete thyroid nodules. Unremarkable esophagus. No pathologically enlarged axillary, mediastinal or gross hilar lymph nodes, noting limited sensitivity for the detection of hilar adenopathy on this noncontrast study. Lungs/Pleura: No pneumothorax. Trace right pleural effusion. No left pleural effusion. Calcified subcentimeter right middle lobe granuloma. No acute consolidative airspace disease, lung masses or significant pulmonary nodules. There several parenchymal bands in the basilar lower lobes, not appreciably changed back to 2012, consistent with postinfectious/ postinflammatory scarring. No significant interlobular septal thickening. Upper abdomen:  Unremarkable. Musculoskeletal: No aggressive appearing focal osseous lesions. There are healed deformities in several right ribs. IMPRESSION: 1. Mild cardiomegaly without CT findings of pulmonary edema. 2. Trace right pleural effusion. 3. Bibasilar lung scarring.  No active pulmonary disease. 4. Aortic atherosclerosis.  One vessel coronary atherosclerosis. Electronically Signed   By: Ilona Sorrel M.D.   On: 11/23/2015 11:10     Medications:     Scheduled Medications: . [MAR Hold] aspirin EC  81 mg Oral Daily  . [MAR Hold] atorvastatin  40 mg Oral Daily  . [MAR Hold] enoxaparin (LOVENOX) injection  40 mg Subcutaneous Q24H  . [MAR Hold] pantoprazole  40 mg Oral Daily  . [MAR Hold] potassium chloride SA  20 mEq Oral Daily  . [MAR Hold] sertraline  50 mg Oral Daily  . [MAR Hold] sodium chloride  250 mL Intravenous Once  . [MAR Hold] sodium chloride flush  3 mL Intravenous Q12H  . sodium chloride flush  3 mL Intravenous Q12H    Infusions: . sodium chloride 10 mL/hr at 11/25/15 0740    PRN Medications: [MAR Hold] sodium chloride, sodium chloride, [MAR Hold] acetaminophen, midazolam, [MAR Hold] nitroGLYCERIN, [MAR Hold] ondansetron (ZOFRAN) IV, [MAR Hold] sodium chloride flush, sodium chloride flush, [MAR Hold] zolpidem   Assessment:   1. Chest pain- normal Cors 2010 2. Acute on chronic combined HF 3. AKI/CKD stage III 4. Polysubstance abuse 5. Smoker 6. Alcohol use 7. Orthostatic Hypotension 8. Hypokalemia 9. Hypotension    Plan/Discussion:    Ryan [name] was orthostatic and hypotensive. Received several IVF boluses. Feels better today. Diuretics and hydralazine on hold. Renal function improved.   Echo reviewed personally. LVEF 15.20% markedly dilated. RV moderately reduced. Suspect ETOH CM but may have component of CAD  R/L cath today to assess for CAD and low output. Counseled on need for ETOH cessation. Watch for DTs.   Not ICD candidate currently given uncertain prognosis.    Ryan Roszak,MD 7:51 AM Advanced Heart Failure Team Pager [phone] (M-F; 7a - 4p)  Please contact [facility] for night-coverage after hours (4p -7a ) and weekends on [email or web address]

## 2015-11-25 NOTE — Interval H&P Note (Signed)
History and Physical Interval Note:  11/25/2015 7:56 AM  Ryan [name]  has presented today for surgery, with the diagnosis of heart failure  The various methods of treatment have been discussed with the patient and family. After consideration of risks, benefits and other options for treatment, the patient has consented to  Procedure(s): Right/Left Heart Cath and Coronary Angiography (N/A) and possible coronary angioplasty as a surgical intervention .  The patient's history has been reviewed, patient examined, no change in status, stable for surgery.  I have reviewed the patient's chart and labs.  Questions were answered to the patient's satisfaction.     Cath Lab Visit (complete for each Cath Lab visit)  Clinical Evaluation Leading to the Procedure:   ACS: No.  Non-ACS:    Anginal Classification: CCS IV  Anti-ischemic medical therapy: Minimal Therapy (1 class of medications)  Non-Invasive Test Results: No non-invasive testing performed  Prior CABG: No previous CABG       Ryan [name], Quillian Quince

## 2015-11-26 LAB — BASIC METABOLIC PANEL
Anion gap: 12 (ref 5–15)
BUN: 19 mg/dL (ref 6–20)
CALCIUM: 9.4 mg/dL (ref 8.9–10.3)
CHLORIDE: 100 mmol/L — AB (ref 101–111)
CO2: 23 mmol/L (ref 22–32)
CREATININE: 1.29 mg/dL — AB (ref 0.61–1.24)
GFR, EST NON AFRICAN AMERICAN: 56 mL/min — AB (ref >60)
Glucose, Bld: 91 mg/dL (ref 65–99)
Potassium: 3.6 mmol/L (ref 3.5–5.1)
SODIUM: 135 mmol/L (ref 135–145)

## 2015-11-26 MED ORDER — SPIRONOLACTONE 25 MG PO TABS
12.5000 mg | ORAL_TABLET | Freq: Every day | ORAL | Status: DC
  Administered 2015-11-26: 12.5 mg via ORAL
  Filled 2015-11-26: qty 1

## 2015-11-26 MED ORDER — LOSARTAN POTASSIUM 25 MG PO TABS
12.5000 mg | ORAL_TABLET | Freq: Every day | ORAL | Status: DC
  Administered 2015-11-26 – 2015-11-27 (×2): 12.5 mg via ORAL
  Filled 2015-11-26 (×2): qty 1

## 2015-11-26 MED ORDER — DIGOXIN 125 MCG PO TABS
0.1250 mg | ORAL_TABLET | Freq: Every day | ORAL | Status: DC
  Administered 2015-11-26 – 2015-11-27 (×2): 0.125 mg via ORAL
  Filled 2015-11-26 (×2): qty 1

## 2015-11-26 MED FILL — Verapamil HCl IV Soln 2.5 MG/ML: INTRAVENOUS | Qty: 2 | Status: AC

## 2015-11-26 NOTE — Progress Notes (Signed)
Discussed with patient the following: Daily maintenance for living with heart failure, including limitation of fluids and salt, weighing self daily and recording on weight log record, and taking medications as prescribed.  Living Better With Heart Failure Booklet reviewed and patient he saw Heart Failure Video this week.  Encouraged him to ask any questions and he stated understanding of all.

## 2015-11-26 NOTE — Progress Notes (Signed)
Advanced Heart Failure Rounding Note   Subjective:   Denies SOB. Dizziness improving.    Objective:   Weight Range:  Vital Signs:   Temp:  [97.6 F (36.4 C)-98 F (36.7 C)] 98 F (36.7 C) (09/15 0612) Pulse Rate:  [0-127] 78 (09/15 0612) Resp:  [13-35] 20 (09/14 2100) BP: (99-125)/(65-99) 104/65 (09/15 0612) SpO2:  [0 %-100 %] 97 % (09/15 0612) Weight:  [87.3 kg (192 lb 8 oz)] 87.3 kg (192 lb 8 oz) (09/15 0612) Last BM Date: 11/25/15  Weight change: Filed Weights   11/24/15 0514 11/25/15 0500 11/26/15 0612  Weight: 85.8 kg (189 lb 3.2 oz) 87.5 kg (192 lb 12.8 oz) 87.3 kg (192 lb 8 oz)    Intake/Output:   Intake/Output Summary (Last 24 hours) at 11/26/15 0844 Last data filed at 11/26/15 0700  Gross per 24 hour  Intake             1010 ml  Output             1700 ml  Net             -690 ml    Physical Exam: General:  No resp difficulty. On bed HEENT: normal x for poor dentition  Neck: supple. JVP flat. Carotids 2+ bilat; no bruits. No lymphadenopathy or thryomegaly appreciated. Cor: PMI laterally displaced. Regular rate & rhythm. No rubs, gallops or murmurs. Lungs: clear Abdomen: round, soft, nontender, nondistended. No hepatosplenomegaly. No bruits or masses. Good bowel sounds. Extremities: no cyanosis, clubbing, rash, edema. Leg scar Neuro: alert & orientedx3, cranial nerves grossly intact. moves all 4 extremities w/o difficulty. Affect pleasant  Telemetry: Sinus  Rhythm 90-110s  Labs: Basic Metabolic Panel:  Recent Labs Lab 11/22/15 1328 11/23/15 0627 11/24/15 0258 11/25/15 0608 11/25/15 1008 11/26/15 0430  NA 141 138 135 136  --  135  K 4.0 3.7 3.1* 3.6  --  3.6  CL 107 102 96* 100*  --  100*  CO2 23 25 25 27   --  23  GLUCOSE 96 100* 112* 105*  --  91  BUN 15 12 21* 24*  --  19  CREATININE 1.36* 1.42* 1.86* 1.57* 1.33* 1.29*  CALCIUM 9.2 9.0 9.6 9.3  --  9.4  MG  --  1.9  --   --   --   --     Liver Function Tests: No results for  input(s): AST, ALT, ALKPHOS, BILITOT, PROT, ALBUMIN in the last 168 hours. No results for input(s): LIPASE, AMYLASE in the last 168 hours. No results for input(s): AMMONIA in the last 168 hours.  CBC:  Recent Labs Lab 11/22/15 1328 11/25/15 1008  WBC 6.5 7.3  HGB 16.0 17.5*  HCT 47.9 52.5*  MCV 95.0 95.1  PLT 231 205    Cardiac Enzymes:  Recent Labs Lab 11/22/15 1919 11/23/15 0044 11/23/15 0627  TROPONINI 0.04* 0.04* 0.05*    BNP: BNP (last 3 results)  Recent Labs  11/22/15 1328  BNP 1,547.3*    ProBNP (last 3 results) No results for input(s): PROBNP in the last 8760 hours.    Other results:  Imaging: No results found.   Medications:     Scheduled Medications: . aspirin EC  81 mg Oral Daily  . atorvastatin  40 mg Oral Daily  . enoxaparin (LOVENOX) injection  40 mg Subcutaneous Q24H  . pantoprazole  40 mg Oral Daily  . potassium chloride SA  20 mEq Oral Daily  . sertraline  50 mg Oral Daily  . sodium chloride  250 mL Intravenous Once  . sodium chloride flush  3 mL Intravenous Q12H  . sodium chloride flush  3 mL Intravenous Q12H    Infusions:    PRN Medications: sodium chloride, sodium chloride, acetaminophen, acetaminophen, nitroGLYCERIN, ondansetron (ZOFRAN) IV, ondansetron (ZOFRAN) IV, sodium chloride flush, sodium chloride flush, zolpidem   Assessment:   1. Chest pain- normal Cors 2010 2. Acute on chronic combined HF 3. AKI/CKD stage III 4. Polysubstance abuse 5. Smoker 6. Alcohol use 7. Orthostatic Hypotension 8. Hypokalemia 9. Hypotension    Plan/Discussion:    Had RHC/LHC. Normal cors. Elevated SVR, low filling pressures, and depresses CO. . Add 12.5 mg spiro at bed time. Add 12.5 mg losartan daily. Add digoxin 0.125 mg daily. NO BB.  Renal function improved.   Echo reviewed personally. LVEF 15.20% markedly dilated. RV moderately reduced. Suspect ETOH CM but may have component of CAD  . Counseled on need for ETOH cessation.  Watch for DTs.   Not ICD candidate currently given uncertain prognosis.    HF meds for discharge Lasix 20 mg Losartan 12.5 mg daily Spiro 12.5 mg daily Dig 0.125 mg daily   As an outpatient he is being referred to HFSW. --> Alcohol abuse.   HF follow up set. Up.   Darrick Grinder, NP-C  8:44 AM Advanced Heart Failure Team Pager [phone] (M-F; 7a - 4p)  Please contact [facility] for night-coverage after hours (4p -7a ) and weekends on [email or web address]  Patient seen and examined with Darrick Grinder, NP. We discussed all aspects of the encounter. I agree with the assessment and plan as stated above.    Results of [place] reviewed with him in detail. Volume status low. Cardiac output marginal. Long talk about how sick his heart is. Not candidate for home milrinone.   Will treat with digoxin, losartan and spiro. No b-blocker at this time. Restart lasix on d/c. Counseled on need to stop ETOH completely.   Hopefully home in am if stable.   Veasna Santibanez,MD 2:09 PM

## 2015-11-26 NOTE — Progress Notes (Signed)
Patient refused bed alarm. Will continue to monitor patient. 

## 2015-11-26 NOTE — Care Management Important Message (Signed)
Important Message  Patient Details  Name: Ryan [name] MRN: [id number] Date of Birth: Jan 10, 1951   Medicare Important Message Given:  Yes    Conley Pawling 11/26/2015, 11:05 AM

## 2015-11-26 NOTE — Clinical Social Work Note (Signed)
Clinical Social Work Assessment  Patient Details  Name: Ryan [name] MRN: [id number] Date of Birth: [birth year]  Date of referral:  11/26/15               Reason for consult:  Substance Use/ETOH Abuse                Permission sought to share information with:    Permission granted to share information::  No  Name::        Agency::     Relationship::     Contact Information:     Housing/Transportation Living arrangements for the past 2 months:  Single Family Home Source of Information:  Patient, Medical Team Patient Interpreter Needed:  None Criminal Activity/Legal Involvement Pertinent to Current Situation/Hospitalization:  No - Comment as needed Significant Relationships:  Siblings, Adult Children, Significant Other Lives with:  Siblings Do you feel safe going back to the place where you live?  Yes Need for family participation in patient care:  Yes (Comment)  Care giving concerns: ETOH abuse.   Social Worker assessment / plan:  CSW met with patient. No supports at bedside. CSW introduced role and discussed consult for ETOH abuse. Patient expressed understanding. CSW provided outpatient substance abuse resources within 10 miles of patient's home as well as 3 AA meetings that occur each week in [place]. Patient reports that he has been through treatment in the past and has a large support system. Patient stated that he was not in need of any other resources. No further concerns. CSW encouraged patient to contact CSW as needed. CSW will sign off as social work intervention is no longer needed. Consult again if any social work needs arise.  Employment status:  Disabled (Comment on whether or not currently receiving Disability) Insurance information:  Medicare PT Recommendations:  Not assessed at this time Information / Referral to community resources:  Outpatient Substance Abuse Treatment Options  Patient/Family's Response to care:  Patient agreeable to receiving substance abuse  resources. Patient reports having a large support system. Patient appreciated social work intervention.  Patient/Family's Understanding of and Emotional Response to Diagnosis, Current Treatment, and Prognosis:  Patient understands reason for substance abuse resources consult. Patient appears happy with hospital care.  Emotional Assessment Appearance:  Appears stated age Attitude/Demeanor/Rapport:  Other (Pleasant) Affect (typically observed):  Accepting, Appropriate, Calm, Pleasant Orientation:  Oriented to Self, Oriented to Place, Oriented to  Time, Oriented to Situation Alcohol / Substance use:  Alcohol Use, Tobacco Use Psych involvement (Current and /or in the community):  No (Comment)  Discharge Needs  Concerns to be addressed:  Care Coordination Readmission within the last 30 days:  No Current discharge risk:  Substance Abuse Barriers to Discharge:  Active Substance Use   Candie Chroman, LCSW 11/26/2015, 1:28 PM

## 2015-11-27 ENCOUNTER — Encounter (HOSPITAL_COMMUNITY): Payer: Self-pay | Admitting: Physician Assistant

## 2015-11-27 DIAGNOSIS — I959 Hypotension, unspecified: Secondary | ICD-10-CM

## 2015-11-27 DIAGNOSIS — N179 Acute kidney failure, unspecified: Secondary | ICD-10-CM

## 2015-11-27 DIAGNOSIS — I5043 Acute on chronic combined systolic (congestive) and diastolic (congestive) heart failure: Secondary | ICD-10-CM

## 2015-11-27 DIAGNOSIS — E876 Hypokalemia: Secondary | ICD-10-CM

## 2015-11-27 DIAGNOSIS — F1910 Other psychoactive substance abuse, uncomplicated: Secondary | ICD-10-CM

## 2015-11-27 LAB — BASIC METABOLIC PANEL
ANION GAP: 10 (ref 5–15)
BUN: 22 mg/dL — ABNORMAL HIGH (ref 6–20)
CHLORIDE: 105 mmol/L (ref 101–111)
CO2: 22 mmol/L (ref 22–32)
Calcium: 9.6 mg/dL (ref 8.9–10.3)
Creatinine, Ser: 1.21 mg/dL (ref 0.61–1.24)
GFR calc Af Amer: >60 mL/min (ref >60)
GFR calc non Af Amer: >60 mL/min (ref >60)
GLUCOSE: 124 mg/dL — AB (ref 65–99)
POTASSIUM: 4 mmol/L (ref 3.5–5.1)
Sodium: 137 mmol/L (ref 135–145)

## 2015-11-27 MED ORDER — DIGOXIN 125 MCG PO TABS: 0.1250 mg | ORAL_TABLET | Freq: Every day | ORAL | 11 refills | Status: DC

## 2015-11-27 MED ORDER — SPIRONOLACTONE 25 MG PO TABS: 12.5000 mg | ORAL_TABLET | Freq: Every day | ORAL | 11 refills | Status: DC

## 2015-11-27 MED ORDER — LOSARTAN POTASSIUM 25 MG PO TABS: 12.5000 mg | ORAL_TABLET | Freq: Every day | ORAL | 11 refills | Status: DC

## 2015-11-27 MED ORDER — FUROSEMIDE 40 MG PO TABS: 20.0000 mg | ORAL_TABLET | Freq: Every day | ORAL | 11 refills | Status: DC

## 2015-11-27 NOTE — Progress Notes (Signed)
Patient given discharge instructions and all questions answered. IV removed.  Telemetry box removed; CCMD notified. Patient discharged via wheelchair with all belongings.  A.Mykenna Viele, RN

## 2015-11-27 NOTE — Progress Notes (Signed)
Advanced Heart Failure Rounding Note   Subjective:   Denies SOB. Dizziness improved.  Was able to walk today without issues.     Objective:   Weight Range:  Vital Signs:   Temp:  [97.6 F (36.4 C)-98.7 F (37.1 C)] 97.8 F (36.6 C) (09/16 1123) Pulse Rate:  [79-88] 88 (09/16 1123) Resp:  [16-20] 20 (09/16 1123) BP: (109-128)/(73-81) 128/73 (09/16 1123) SpO2:  [96 %-100 %] 99 % (09/16 1123) Weight:  [193 lb 14.4 oz (88 kg)] 193 lb 14.4 oz (88 kg) (09/16 0528) Last BM Date: 11/27/15  Weight change: Filed Weights   11/25/15 0500 11/26/15 0612 11/27/15 0528  Weight: 192 lb 12.8 oz (87.5 kg) 192 lb 8 oz (87.3 kg) 193 lb 14.4 oz (88 kg)    Intake/Output:   Intake/Output Summary (Last 24 hours) at 11/27/15 1327 Last data filed at 11/27/15 1100  Gross per 24 hour  Intake              720 ml  Output              750 ml  Net              -30 ml    Physical Exam: General:  No resp difficulty. On bed HEENT: normal x for poor dentition  Neck: supple. JVP flat. Carotids 2+ bilat; no bruits. No lymphadenopathy or thryomegaly appreciated. Cor: PMI laterally displaced. Regular rate & rhythm. No rubs, gallops or murmurs. Lungs: clear Abdomen: round, soft, nontender, nondistended. No hepatosplenomegaly. No bruits or masses. Good bowel sounds. Extremities: no cyanosis, clubbing, rash, edema. Leg scar Neuro: alert & orientedx3, cranial nerves grossly intact. moves all 4 extremities w/o difficulty. Affect pleasant  Telemetry: Sinus  Rhythm 90-110s  Labs: Basic Metabolic Panel:  Recent Labs Lab 11/23/15 0627 11/24/15 0258 11/25/15 0608 11/25/15 1008 11/26/15 0430 11/27/15 0210  NA 138 135 136  --  135 137  K 3.7 3.1* 3.6  --  3.6 4.0  CL 102 96* 100*  --  100* 105  CO2 25 25 27   --  23 22  GLUCOSE 100* 112* 105*  --  91 124*  BUN 12 21* 24*  --  19 22*  CREATININE 1.42* 1.86* 1.57* 1.33* 1.29* 1.21  CALCIUM 9.0 9.6 9.3  --  9.4 9.6  MG 1.9  --   --   --   --   --       Liver Function Tests: No results for input(s): AST, ALT, ALKPHOS, BILITOT, PROT, ALBUMIN in the last 168 hours. No results for input(s): LIPASE, AMYLASE in the last 168 hours. No results for input(s): AMMONIA in the last 168 hours.  CBC:  Recent Labs Lab 11/22/15 1328 11/25/15 1008  WBC 6.5 7.3  HGB 16.0 17.5*  HCT 47.9 52.5*  MCV 95.0 95.1  PLT 231 205    Cardiac Enzymes:  Recent Labs Lab 11/22/15 1919 11/23/15 0044 11/23/15 0627  TROPONINI 0.04* 0.04* 0.05*    BNP: BNP (last 3 results)  Recent Labs  11/22/15 1328  BNP 1,547.3*    ProBNP (last 3 results) No results for input(s): PROBNP in the last 8760 hours.    Other results:  Imaging: No results found.   Medications:     Scheduled Medications: . aspirin EC  81 mg Oral Daily  . atorvastatin  40 mg Oral Daily  . digoxin  0.125 mg Oral Daily  . enoxaparin (LOVENOX) injection  40 mg Subcutaneous Q24H  .  losartan  12.5 mg Oral Daily  . pantoprazole  40 mg Oral Daily  . potassium chloride SA  20 mEq Oral Daily  . sertraline  50 mg Oral Daily  . sodium chloride  250 mL Intravenous Once  . sodium chloride flush  3 mL Intravenous Q12H  . sodium chloride flush  3 mL Intravenous Q12H  . spironolactone  12.5 mg Oral QHS    Infusions:    PRN Medications: sodium chloride, sodium chloride, acetaminophen, acetaminophen, nitroGLYCERIN, ondansetron (ZOFRAN) IV, ondansetron (ZOFRAN) IV, sodium chloride flush, sodium chloride flush, zolpidem   Assessment:   1. Chest pain- normal Cors 2010 2. Acute on chronic combined HF 3. AKI/CKD stage III 4. Polysubstance abuse 5. Smoker 6. Alcohol use 7. Orthostatic Hypotension 8. Hypokalemia 9. Hypotension    Plan/Discussion:    Had RHC/LHC. Normal cors. Elevated SVR, low filling pressures, and depresses CO. Renal function continuing to improve.   LVEF 15.20% markedly dilated. RV moderately reduced. Suspect ETOH CM but may have component of  CAD  Counseled on EtOH sedation. Not volume overloaded.  Plan for discharge today.  Not ICD candidate currently given uncertain prognosis.    HF meds for discharge Lasix 20 mg Losartan 12.5 mg daily Spiro 12.5 mg daily Dig 0.125 mg daily   As an outpatient he is being referred to HFSW. --> Alcohol abuse.   HF follow up set up.   Shantae Vantol Meredith Leeds, MD 1:27 PM
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
## 2015-12-08 ENCOUNTER — Inpatient Hospital Stay (HOSPITAL_COMMUNITY): Payer: Medicare Other

## 2015-12-08 DIAGNOSIS — F17210 Nicotine dependence, cigarettes, uncomplicated: Secondary | ICD-10-CM | POA: Diagnosis not present

## 2015-12-08 DIAGNOSIS — M79605 Pain in left leg: Secondary | ICD-10-CM | POA: Diagnosis not present

## 2015-12-08 DIAGNOSIS — L84 Corns and callosities: Secondary | ICD-10-CM | POA: Diagnosis not present

## 2015-12-08 DIAGNOSIS — M79672 Pain in left foot: Secondary | ICD-10-CM | POA: Diagnosis not present

## 2015-12-08 DIAGNOSIS — E785 Hyperlipidemia, unspecified: Secondary | ICD-10-CM | POA: Diagnosis not present

## 2015-12-08 DIAGNOSIS — I10 Essential (primary) hypertension: Secondary | ICD-10-CM | POA: Diagnosis not present

## 2015-12-08 DIAGNOSIS — M85872 Other specified disorders of bone density and structure, left ankle and foot: Secondary | ICD-10-CM | POA: Diagnosis not present

## 2015-12-14 DIAGNOSIS — F17210 Nicotine dependence, cigarettes, uncomplicated: Secondary | ICD-10-CM | POA: Diagnosis not present

## 2015-12-14 DIAGNOSIS — Z79899 Other long term (current) drug therapy: Secondary | ICD-10-CM | POA: Diagnosis not present

## 2015-12-14 DIAGNOSIS — Z9889 Other specified postprocedural states: Secondary | ICD-10-CM | POA: Diagnosis not present

## 2015-12-14 DIAGNOSIS — I10 Essential (primary) hypertension: Secondary | ICD-10-CM | POA: Diagnosis not present

## 2015-12-14 DIAGNOSIS — M79672 Pain in left foot: Secondary | ICD-10-CM | POA: Diagnosis not present

## 2015-12-14 DIAGNOSIS — E785 Hyperlipidemia, unspecified: Secondary | ICD-10-CM | POA: Diagnosis not present

## 2015-12-15 ENCOUNTER — Encounter (HOSPITAL_COMMUNITY): Payer: Self-pay

## 2015-12-15 ENCOUNTER — Ambulatory Visit (HOSPITAL_COMMUNITY)
Admission: RE | Admit: 2015-12-15 | Discharge: 2015-12-15 | Disposition: A | Discharge status: Home / Self Care | Payer: Medicare Other | Source: Ambulatory Visit | Attending: Cardiology | Admitting: Cardiology

## 2015-12-15 ENCOUNTER — Telehealth (HOSPITAL_COMMUNITY): Payer: Self-pay

## 2015-12-15 VITALS — BP 120/90 | HR 80 | Ht 71.0 in | Wt 197.0 lb

## 2015-12-15 DIAGNOSIS — J449 Chronic obstructive pulmonary disease, unspecified: Secondary | ICD-10-CM | POA: Insufficient documentation

## 2015-12-15 DIAGNOSIS — Z9119 Patient's noncompliance with other medical treatment and regimen: Secondary | ICD-10-CM | POA: Diagnosis not present

## 2015-12-15 DIAGNOSIS — Z79899 Other long term (current) drug therapy: Secondary | ICD-10-CM | POA: Diagnosis not present

## 2015-12-15 DIAGNOSIS — M545 Low back pain, unspecified: Secondary | ICD-10-CM

## 2015-12-15 DIAGNOSIS — F17210 Nicotine dependence, cigarettes, uncomplicated: Secondary | ICD-10-CM | POA: Diagnosis not present

## 2015-12-15 DIAGNOSIS — K219 Gastro-esophageal reflux disease without esophagitis: Secondary | ICD-10-CM | POA: Insufficient documentation

## 2015-12-15 DIAGNOSIS — N183 Chronic kidney disease, stage 3 unspecified: Secondary | ICD-10-CM

## 2015-12-15 DIAGNOSIS — Z8249 Family history of ischemic heart disease and other diseases of the circulatory system: Secondary | ICD-10-CM | POA: Insufficient documentation

## 2015-12-15 DIAGNOSIS — I429 Cardiomyopathy, unspecified: Secondary | ICD-10-CM | POA: Insufficient documentation

## 2015-12-15 DIAGNOSIS — M1990 Unspecified osteoarthritis, unspecified site: Secondary | ICD-10-CM | POA: Insufficient documentation

## 2015-12-15 DIAGNOSIS — F329 Major depressive disorder, single episode, unspecified: Secondary | ICD-10-CM | POA: Diagnosis not present

## 2015-12-15 DIAGNOSIS — I10 Essential (primary) hypertension: Secondary | ICD-10-CM | POA: Diagnosis not present

## 2015-12-15 DIAGNOSIS — G8929 Other chronic pain: Secondary | ICD-10-CM

## 2015-12-15 DIAGNOSIS — I5042 Chronic combined systolic (congestive) and diastolic (congestive) heart failure: Secondary | ICD-10-CM | POA: Diagnosis not present

## 2015-12-15 DIAGNOSIS — I130 Hypertensive heart and chronic kidney disease with heart failure and stage 1 through stage 4 chronic kidney disease, or unspecified chronic kidney disease: Secondary | ICD-10-CM | POA: Insufficient documentation

## 2015-12-15 DIAGNOSIS — H409 Unspecified glaucoma: Secondary | ICD-10-CM | POA: Diagnosis not present

## 2015-12-15 DIAGNOSIS — I5022 Chronic systolic (congestive) heart failure: Secondary | ICD-10-CM | POA: Diagnosis not present

## 2015-12-15 DIAGNOSIS — J410 Simple chronic bronchitis: Secondary | ICD-10-CM

## 2015-12-15 DIAGNOSIS — F17200 Nicotine dependence, unspecified, uncomplicated: Secondary | ICD-10-CM | POA: Diagnosis not present

## 2015-12-15 DIAGNOSIS — Z7982 Long term (current) use of aspirin: Secondary | ICD-10-CM | POA: Diagnosis not present

## 2015-12-15 LAB — BASIC METABOLIC PANEL
ANION GAP: 12 (ref 5–15)
BUN: 12 mg/dL (ref 6–20)
CALCIUM: 9.1 mg/dL (ref 8.9–10.3)
CO2: 24 mmol/L (ref 22–32)
Chloride: 102 mmol/L (ref 101–111)
Creatinine, Ser: 1.4 mg/dL — ABNORMAL HIGH (ref 0.61–1.24)
GFR calc non Af Amer: 51 mL/min — ABNORMAL LOW (ref >60)
GFR, EST AFRICAN AMERICAN: 59 mL/min — AB (ref >60)
Glucose, Bld: 96 mg/dL (ref 65–99)
POTASSIUM: 3.5 mmol/L (ref 3.5–5.1)
Sodium: 138 mmol/L (ref 135–145)

## 2015-12-15 LAB — BRAIN NATRIURETIC PEPTIDE: B Natriuretic Peptide: 301.8 pg/mL — ABNORMAL HIGH (ref 0.0–100.0)

## 2015-12-15 MED ORDER — LOSARTAN POTASSIUM 25 MG PO TABS: 12.5000 mg | ORAL_TABLET | Freq: Every day | ORAL | 2 refills | Status: DC

## 2015-12-15 MED FILL — hydrALAZINE HCL 50 MG TABS: 50 | 34 days supply | Qty: 102 | Fill #0

## 2015-12-15 MED FILL — LOSARTAN POTASSIUM 25 MG TA: 25 | 34 days supply | Qty: 17 | Fill #0

## 2015-12-15 MED FILL — ISOSORBIDE MN ER 30 MG TAB: 30 | 34 days supply | Qty: 34 | Fill #0

## 2015-12-15 MED FILL — FUROSEMIDE 20 MG TABLET: 20 | 100 days supply | Qty: 100 | Fill #0

## 2015-12-15 MED FILL — SPIRONOLACTONE 25 MG TABLET: 25 | 34 days supply | Qty: 34 | Fill #0

## 2015-12-15 MED FILL — DIGITEK 125 MCG TABLET: 125 | 34 days supply | Qty: 34 | Fill #0
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
## 2015-12-15 NOTE — Telephone Encounter (Addendum)
Spoke with pharmacy assistant at [place] who states patient brought in Rx from CHF clinic without any isntructions. States Rx only listed Furosemide 20 mg tablet without any instruction.   After 5-10 minutes of trying to get ahold of someone, sent patient with filled Rx for 20 mg furosemide tablets that stated "take as directed" with 100 tablets. Patient told the pharmacist he knew how to take it, and that he only takes 1/2 tablet once daily. Per our records, patient was taking 1/2 of a 40 mg tablet, not a 20 mg tablet. Patient should be taking 20 mg (one whole tablet of what was filled today by [facility]) once daily. Patient told pharmacist that he was coming straight back to CHF clinic to clarify instructions, but has not showed up to our clinic as he reported he would do. Have attempted multiple times on all lines listed in chart to reach patient to clarify instructions without an answer or a VM to leave message on.  Will continue to try to reach patient.  Renee Pain, RN

## 2015-12-15 NOTE — Patient Instructions (Signed)
STOP Coreg (the little bitty pill) START Losartan 12.5 mg (one half tab) daily  Labs today We will only contact you if something comes back abnormal or we need to make some changes. Otherwise no news is good news!  Your physician recommends that you schedule a follow-up appointment in: 1 week with the [facility] pharmacist Doroteo Bradford, PharmD And in 2 weeks with Rebecca Eaton  Do the following things EVERYDAY: 1) Weigh yourself in the morning before breakfast. Write it down and keep it in a log. 2) Take your medicines as prescribed 3) Eat low salt foods-Limit salt (sodium) to 2000 mg per day.  4) Stay as active as you can everyday 5) Limit all fluids for the day to less than 2 liters

## 2015-12-16 ENCOUNTER — Telehealth: Payer: Self-pay | Admitting: Licensed Clinical Social Worker

## 2015-12-16 NOTE — Telephone Encounter (Signed)
CSW referred to assist with insurance assistance. CSW contacted patient although not available at the time. Patient's sister stated she had the questions regarding insurance. She reports patient was previously on medicaid and terminated a few months back. Sister reports he has accumulating medical bills and wondering if he could apply for medicaid. CSW discussed process and options for medicaid applications. Sister plans to follow up with patient and assist further with application. CSW available as needed. Raquel Sarna, LCSW [phone]

## 2015-12-22 ENCOUNTER — Ambulatory Visit (HOSPITAL_COMMUNITY): Payer: Medicare Other

## 2015-12-27 ENCOUNTER — Ambulatory Visit (HOSPITAL_COMMUNITY)
Admission: RE | Admit: 2015-12-27 | Discharge: 2015-12-27 | Disposition: A | Discharge status: Home / Self Care | Payer: Medicare Other | Source: Ambulatory Visit | Attending: Cardiology | Admitting: Cardiology

## 2015-12-27 VITALS — BP 122/78 | HR 100 | Wt 202.4 lb

## 2015-12-27 DIAGNOSIS — Z9114 Patient's other noncompliance with medication regimen: Secondary | ICD-10-CM | POA: Diagnosis not present

## 2015-12-27 DIAGNOSIS — F17210 Nicotine dependence, cigarettes, uncomplicated: Secondary | ICD-10-CM | POA: Diagnosis not present

## 2015-12-27 DIAGNOSIS — I130 Hypertensive heart and chronic kidney disease with heart failure and stage 1 through stage 4 chronic kidney disease, or unspecified chronic kidney disease: Secondary | ICD-10-CM | POA: Insufficient documentation

## 2015-12-27 DIAGNOSIS — J449 Chronic obstructive pulmonary disease, unspecified: Secondary | ICD-10-CM | POA: Diagnosis not present

## 2015-12-27 DIAGNOSIS — I5022 Chronic systolic (congestive) heart failure: Secondary | ICD-10-CM | POA: Diagnosis not present

## 2015-12-27 DIAGNOSIS — N183 Chronic kidney disease, stage 3 (moderate): Secondary | ICD-10-CM | POA: Diagnosis not present

## 2015-12-27 DIAGNOSIS — R079 Chest pain, unspecified: Secondary | ICD-10-CM | POA: Diagnosis not present

## 2015-12-27 LAB — BASIC METABOLIC PANEL
Anion gap: 7 (ref 5–15)
BUN: 12 mg/dL (ref 6–20)
CALCIUM: 9.5 mg/dL (ref 8.9–10.3)
CO2: 24 mmol/L (ref 22–32)
CREATININE: 1.37 mg/dL — AB (ref 0.61–1.24)
Chloride: 106 mmol/L (ref 101–111)
GFR calc Af Amer: >60 mL/min (ref >60)
GFR, EST NON AFRICAN AMERICAN: 52 mL/min — AB (ref >60)
GLUCOSE: 106 mg/dL — AB (ref 65–99)
POTASSIUM: 4 mmol/L (ref 3.5–5.1)
SODIUM: 137 mmol/L (ref 135–145)

## 2015-12-27 LAB — DIGOXIN LEVEL

## 2015-12-27 MED ORDER — LOSARTAN POTASSIUM 25 MG PO TABS: 25.0000 mg | ORAL_TABLET | Freq: Every day | ORAL | 5 refills | Status: DC

## 2015-12-27 MED ORDER — FUROSEMIDE 20 MG PO TABS: 20.0000 mg | ORAL_TABLET | Freq: Every day | ORAL | 5 refills | Status: DC

## 2015-12-27 MED FILL — LOSARTAN POTASSIUM 25 MG TA: 25 | 30 days supply | Qty: 30 | Fill #0
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
## 2015-12-27 NOTE — Patient Instructions (Signed)
Please INCREASE your Losartan to 25 mg (1 tablet) DAILY. Please take an extra furosemide 20 mg (1 tablet) for weight gain > 3 lbs in 1 day or > 5 lbs in 1 week.  Labs today. Will call you with any abnormalities.   Please keep your appointment with Oda Kilts, PA-C on Thursday 11/2 at 1:40 pm. Garage code is [id number].

## 2015-12-27 NOTE — Progress Notes (Signed)
CSW referred to follow up with patient regarding prescription assistance. CSW met with patient in the clinic who states plans to go to [facility] tomorrow to discuss further his Medicare D. CSW discussed extra help program to help off set medicare premiums and to clarify medication list when signing up for Medicare D. Patient in need of bloodwork today in the clinic and shared that he is reluctant because "I jump at needles". CSW offered supportive intervention and assistance to have his blood draw in clinic today. Patient agreeable and blood draw obtained by staff.  CSW provided supportive intervention and encouraged patient to return call for further assistance as needed. Patient appears grateful for assistance and will follow up as needed. CSW continues to be available as needed. Raquel Sarna, LCSW [phone]

## 2015-12-29 ENCOUNTER — Telehealth: Payer: Self-pay | Admitting: Licensed Clinical Social Worker

## 2015-12-29 ENCOUNTER — Encounter (HOSPITAL_COMMUNITY): Payer: Medicare Other

## 2015-12-29 NOTE — Telephone Encounter (Signed)
CSW attempted to contact patient to follow up on visit earlier this week. CSW unable to reach patient as no answer. CSW will continue to be available as needed. Raquel Sarna, LCSW [phone]

## 2015-12-30 DIAGNOSIS — D2372 Other benign neoplasm of skin of left lower limb, including hip: Secondary | ICD-10-CM | POA: Diagnosis not present

## 2016-01-04 ENCOUNTER — Encounter (HOSPITAL_COMMUNITY): Payer: Self-pay | Admitting: *Deleted

## 2016-01-04 ENCOUNTER — Telehealth (HOSPITAL_COMMUNITY): Payer: Self-pay | Admitting: *Deleted

## 2016-01-04 NOTE — Telephone Encounter (Signed)
Notes Recorded by Harvie Junior, CMA on 01/04/2016 at 3:31 PM EDT Unable to reach patient. Letter mailed.   ------  Notes Recorded by Scarlette Calico, RN on 01/03/2016 at 3:29 PM EDT Left message for patient to call back.  ------  Notes Recorded by Harvie Junior, CMA on 12/31/2015 at 2:21 PM EDT Left message for patient to call back.  ------  Notes Recorded by Harvie Junior, CMA on 12/30/2015 at 3:35 PM EDT Left message for patient to call back.  ------  Notes Recorded by Harvie Junior, CMA on 12/27/2015 at 3:49 PM EDT Left message for patient to call back.  ------  Notes Recorded by Larey Dresser, MD on 12/27/2015 at 3:40 PM EDT Labs ok, confirm that he is taking digoxin.    Ref Range & Units 8d ago 34yr ago 46yr ago   Digoxin Level 0.8 - 2.0 ng/mL <0.2   <0.3   0.6

## 2016-01-04 NOTE — Telephone Encounter (Signed)
-----   Message from Larey Dresser, MD sent at 12/27/2015  3:40 PM EDT ----- Labs ok, confirm that he is taking digoxin.

## 2016-01-13 ENCOUNTER — Inpatient Hospital Stay (HOSPITAL_COMMUNITY): Admission: RE | Admit: 2016-01-13 | Payer: Medicare Other | Source: Ambulatory Visit

## 2016-01-30 ENCOUNTER — Emergency Department (HOSPITAL_COMMUNITY): Payer: Medicare Other

## 2016-01-30 ENCOUNTER — Encounter (HOSPITAL_COMMUNITY): Payer: Self-pay | Admitting: Emergency Medicine

## 2016-01-30 DIAGNOSIS — N200 Calculus of kidney: Secondary | ICD-10-CM | POA: Diagnosis not present

## 2016-01-30 DIAGNOSIS — J9811 Atelectasis: Secondary | ICD-10-CM | POA: Diagnosis not present

## 2016-01-30 DIAGNOSIS — R1012 Left upper quadrant pain: Secondary | ICD-10-CM | POA: Diagnosis not present

## 2016-01-30 DIAGNOSIS — I5042 Chronic combined systolic (congestive) and diastolic (congestive) heart failure: Secondary | ICD-10-CM | POA: Diagnosis present

## 2016-01-30 DIAGNOSIS — J449 Chronic obstructive pulmonary disease, unspecified: Secondary | ICD-10-CM | POA: Diagnosis present

## 2016-01-30 DIAGNOSIS — Z79899 Other long term (current) drug therapy: Secondary | ICD-10-CM

## 2016-01-30 DIAGNOSIS — N183 Chronic kidney disease, stage 3 (moderate): Secondary | ICD-10-CM | POA: Diagnosis present

## 2016-01-30 DIAGNOSIS — F17210 Nicotine dependence, cigarettes, uncomplicated: Secondary | ICD-10-CM | POA: Diagnosis present

## 2016-01-30 DIAGNOSIS — Z7982 Long term (current) use of aspirin: Secondary | ICD-10-CM

## 2016-01-30 DIAGNOSIS — I428 Other cardiomyopathies: Secondary | ICD-10-CM | POA: Diagnosis present

## 2016-01-30 DIAGNOSIS — R61 Generalized hyperhidrosis: Secondary | ICD-10-CM | POA: Diagnosis present

## 2016-01-30 DIAGNOSIS — I130 Hypertensive heart and chronic kidney disease with heart failure and stage 1 through stage 4 chronic kidney disease, or unspecified chronic kidney disease: Secondary | ICD-10-CM | POA: Diagnosis present

## 2016-01-30 DIAGNOSIS — K852 Alcohol induced acute pancreatitis without necrosis or infection: Principal | ICD-10-CM | POA: Diagnosis present

## 2016-01-30 DIAGNOSIS — Z8249 Family history of ischemic heart disease and other diseases of the circulatory system: Secondary | ICD-10-CM

## 2016-01-30 DIAGNOSIS — Z833 Family history of diabetes mellitus: Secondary | ICD-10-CM

## 2016-01-30 DIAGNOSIS — K219 Gastro-esophageal reflux disease without esophagitis: Secondary | ICD-10-CM | POA: Diagnosis present

## 2016-01-30 DIAGNOSIS — F1020 Alcohol dependence, uncomplicated: Secondary | ICD-10-CM | POA: Diagnosis present

## 2016-01-30 DIAGNOSIS — Z823 Family history of stroke: Secondary | ICD-10-CM

## 2016-01-30 DIAGNOSIS — Y909 Presence of alcohol in blood, level not specified: Secondary | ICD-10-CM | POA: Diagnosis present

## 2016-01-30 DIAGNOSIS — Z806 Family history of leukemia: Secondary | ICD-10-CM

## 2016-01-30 LAB — I-STAT TROPONIN, ED: TROPONIN I, POC: 0.01 ng/mL (ref 0.00–0.08)

## 2016-01-30 NOTE — ED Triage Notes (Signed)
C/o sharp pain to L chest x 2 days with sob, nausea, and vomited x 1.

## 2016-01-31 ENCOUNTER — Emergency Department (HOSPITAL_COMMUNITY): Payer: Medicare Other

## 2016-01-31 ENCOUNTER — Inpatient Hospital Stay (HOSPITAL_COMMUNITY)
Admission: EM | Admit: 2016-01-31 | Discharge: 2016-02-04 | DRG: 439 | Disposition: A | Discharge status: Home / Self Care | Payer: Medicare Other | Attending: Internal Medicine | Admitting: Internal Medicine

## 2016-01-31 ENCOUNTER — Encounter (HOSPITAL_COMMUNITY): Payer: Self-pay | Admitting: Internal Medicine

## 2016-01-31 DIAGNOSIS — I5022 Chronic systolic (congestive) heart failure: Secondary | ICD-10-CM

## 2016-01-31 DIAGNOSIS — Y909 Presence of alcohol in blood, level not specified: Secondary | ICD-10-CM | POA: Diagnosis present

## 2016-01-31 DIAGNOSIS — F17210 Nicotine dependence, cigarettes, uncomplicated: Secondary | ICD-10-CM | POA: Diagnosis present

## 2016-01-31 DIAGNOSIS — F1020 Alcohol dependence, uncomplicated: Secondary | ICD-10-CM | POA: Insufficient documentation

## 2016-01-31 DIAGNOSIS — I130 Hypertensive heart and chronic kidney disease with heart failure and stage 1 through stage 4 chronic kidney disease, or unspecified chronic kidney disease: Secondary | ICD-10-CM | POA: Diagnosis present

## 2016-01-31 DIAGNOSIS — K859 Acute pancreatitis without necrosis or infection, unspecified: Secondary | ICD-10-CM | POA: Diagnosis present

## 2016-01-31 DIAGNOSIS — Z806 Family history of leukemia: Secondary | ICD-10-CM | POA: Diagnosis not present

## 2016-01-31 DIAGNOSIS — R1012 Left upper quadrant pain: Secondary | ICD-10-CM | POA: Diagnosis present

## 2016-01-31 DIAGNOSIS — I10 Essential (primary) hypertension: Secondary | ICD-10-CM | POA: Diagnosis not present

## 2016-01-31 DIAGNOSIS — N183 Chronic kidney disease, stage 3 unspecified: Secondary | ICD-10-CM | POA: Diagnosis present

## 2016-01-31 DIAGNOSIS — Z833 Family history of diabetes mellitus: Secondary | ICD-10-CM | POA: Diagnosis not present

## 2016-01-31 DIAGNOSIS — F1010 Alcohol abuse, uncomplicated: Secondary | ICD-10-CM | POA: Diagnosis not present

## 2016-01-31 DIAGNOSIS — F1910 Other psychoactive substance abuse, uncomplicated: Secondary | ICD-10-CM | POA: Diagnosis not present

## 2016-01-31 DIAGNOSIS — K852 Alcohol induced acute pancreatitis without necrosis or infection: Secondary | ICD-10-CM

## 2016-01-31 DIAGNOSIS — N200 Calculus of kidney: Secondary | ICD-10-CM | POA: Diagnosis not present

## 2016-01-31 DIAGNOSIS — J449 Chronic obstructive pulmonary disease, unspecified: Secondary | ICD-10-CM | POA: Diagnosis present

## 2016-01-31 DIAGNOSIS — R61 Generalized hyperhidrosis: Secondary | ICD-10-CM | POA: Diagnosis present

## 2016-01-31 DIAGNOSIS — I428 Other cardiomyopathies: Secondary | ICD-10-CM | POA: Diagnosis present

## 2016-01-31 DIAGNOSIS — Z79899 Other long term (current) drug therapy: Secondary | ICD-10-CM | POA: Diagnosis not present

## 2016-01-31 DIAGNOSIS — Z823 Family history of stroke: Secondary | ICD-10-CM | POA: Diagnosis not present

## 2016-01-31 DIAGNOSIS — N182 Chronic kidney disease, stage 2 (mild): Secondary | ICD-10-CM | POA: Diagnosis present

## 2016-01-31 DIAGNOSIS — I5042 Chronic combined systolic (congestive) and diastolic (congestive) heart failure: Secondary | ICD-10-CM | POA: Diagnosis present

## 2016-01-31 DIAGNOSIS — Z7982 Long term (current) use of aspirin: Secondary | ICD-10-CM | POA: Diagnosis not present

## 2016-01-31 DIAGNOSIS — Z8249 Family history of ischemic heart disease and other diseases of the circulatory system: Secondary | ICD-10-CM | POA: Diagnosis not present

## 2016-01-31 DIAGNOSIS — K219 Gastro-esophageal reflux disease without esophagitis: Secondary | ICD-10-CM | POA: Diagnosis present

## 2016-01-31 DIAGNOSIS — I5023 Acute on chronic systolic (congestive) heart failure: Secondary | ICD-10-CM | POA: Diagnosis present

## 2016-01-31 LAB — LIPID PANEL
CHOL/HDL RATIO: 3 ratio
CHOLESTEROL: 147 mg/dL (ref 0–200)
HDL: 49 mg/dL (ref >40)
LDL Cholesterol: 67 mg/dL (ref 0–99)
Triglycerides: 157 mg/dL — ABNORMAL HIGH (ref <150)
VLDL: 31 mg/dL (ref 0–40)

## 2016-01-31 LAB — BASIC METABOLIC PANEL
Anion gap: 9 (ref 5–15)
Anion gap: 9 (ref 5–15)
BUN: 11 mg/dL (ref 6–20)
BUN: 13 mg/dL (ref 6–20)
CHLORIDE: 102 mmol/L (ref 101–111)
CO2: 22 mmol/L (ref 22–32)
CO2: 24 mmol/L (ref 22–32)
CREATININE: 1.26 mg/dL — AB (ref 0.61–1.24)
Calcium: 9.2 mg/dL (ref 8.9–10.3)
Calcium: 9.2 mg/dL (ref 8.9–10.3)
Chloride: 101 mmol/L (ref 101–111)
Creatinine, Ser: 1.28 mg/dL — ABNORMAL HIGH (ref 0.61–1.24)
GFR calc Af Amer: >60 mL/min (ref >60)
GFR calc Af Amer: >60 mL/min (ref >60)
GFR calc non Af Amer: 57 mL/min — ABNORMAL LOW (ref >60)
GFR calc non Af Amer: 58 mL/min — ABNORMAL LOW (ref >60)
GLUCOSE: 104 mg/dL — AB (ref 65–99)
Glucose, Bld: 104 mg/dL — ABNORMAL HIGH (ref 65–99)
Potassium: 4.1 mmol/L (ref 3.5–5.1)
Potassium: 4.2 mmol/L (ref 3.5–5.1)
Sodium: 133 mmol/L — ABNORMAL LOW (ref 135–145)
Sodium: 134 mmol/L — ABNORMAL LOW (ref 135–145)

## 2016-01-31 LAB — CBC WITH DIFFERENTIAL/PLATELET
BASOS ABS: 0 10*3/uL (ref 0.0–0.1)
Basophils Relative: 0 %
EOS PCT: 1 %
Eosinophils Absolute: 0.1 10*3/uL (ref 0.0–0.7)
HCT: 42.5 % (ref 39.0–52.0)
HEMOGLOBIN: 14.2 g/dL (ref 13.0–17.0)
LYMPHS ABS: 1.3 10*3/uL (ref 0.7–4.0)
LYMPHS PCT: 12 %
MCH: 31 pg (ref 26.0–34.0)
MCHC: 33.4 g/dL (ref 30.0–36.0)
MCV: 92.8 fL (ref 78.0–100.0)
Monocytes Absolute: 0.7 10*3/uL (ref 0.1–1.0)
Monocytes Relative: 7 %
NEUTROS PCT: 80 %
Neutro Abs: 8.3 10*3/uL — ABNORMAL HIGH (ref 1.7–7.7)
PLATELETS: 189 10*3/uL (ref 150–400)
RBC: 4.58 MIL/uL (ref 4.22–5.81)
RDW: 15.2 % (ref 11.5–15.5)
WBC: 10.4 10*3/uL (ref 4.0–10.5)

## 2016-01-31 LAB — HEPATIC FUNCTION PANEL
ALT: 40 U/L (ref 17–63)
ALT: 34 U/L (ref 17–63)
AST: 34 U/L (ref 15–41)
AST: 31 U/L (ref 15–41)
Albumin: 3.8 g/dL (ref 3.5–5.0)
Albumin: 3.6 g/dL (ref 3.5–5.0)
Alkaline Phosphatase: 77 U/L (ref 38–126)
Alkaline Phosphatase: 76 U/L (ref 38–126)
BILIRUBIN DIRECT: 0.2 mg/dL (ref 0.1–0.5)
BILIRUBIN TOTAL: 0.8 mg/dL (ref 0.3–1.2)
Bilirubin, Direct: 0.2 mg/dL (ref 0.1–0.5)
Indirect Bilirubin: 0.6 mg/dL (ref 0.3–0.9)
Indirect Bilirubin: 0.6 mg/dL (ref 0.3–0.9)
Total Bilirubin: 0.8 mg/dL (ref 0.3–1.2)
Total Protein: 7.1 g/dL (ref 6.5–8.1)
Total Protein: 6.6 g/dL (ref 6.5–8.1)

## 2016-01-31 LAB — TROPONIN I: TROPONIN I: 0.05 ng/mL — AB (ref <0.03)

## 2016-01-31 LAB — CBC
HCT: 44.4 % (ref 39.0–52.0)
Hemoglobin: 15.3 g/dL (ref 13.0–17.0)
MCH: 31.9 pg (ref 26.0–34.0)
MCHC: 34.5 g/dL (ref 30.0–36.0)
MCV: 92.7 fL (ref 78.0–100.0)
PLATELETS: 214 10*3/uL (ref 150–400)
RBC: 4.79 MIL/uL (ref 4.22–5.81)
RDW: 15.4 % (ref 11.5–15.5)
WBC: 12.1 10*3/uL — ABNORMAL HIGH (ref 4.0–10.5)

## 2016-01-31 LAB — PROTIME-INR
INR: 1.03
PROTHROMBIN TIME: 13.5 s (ref 11.4–15.2)

## 2016-01-31 LAB — I-STAT TROPONIN, ED: Troponin i, poc: 0.01 ng/mL (ref 0.00–0.08)

## 2016-01-31 LAB — BRAIN NATRIURETIC PEPTIDE: B Natriuretic Peptide: 516 pg/mL — ABNORMAL HIGH (ref 0.0–100.0)

## 2016-01-31 LAB — LACTATE DEHYDROGENASE: LDH: 235 U/L — AB (ref 98–192)

## 2016-01-31 LAB — LIPASE, BLOOD: Lipase: 104 U/L — ABNORMAL HIGH (ref 11–51)

## 2016-01-31 LAB — DIGOXIN LEVEL: Digoxin Level: 2.2 ng/mL — ABNORMAL HIGH (ref 0.8–2.0)

## 2016-01-31 LAB — CBG MONITORING, ED: Glucose-Capillary: 133 mg/dL — ABNORMAL HIGH (ref 65–99)

## 2016-01-31 MED ORDER — FENTANYL CITRATE (PF) 100 MCG/2ML IJ SOLN
50.0000 ug | Freq: Once | INTRAMUSCULAR | Status: AC
  Administered 2016-01-31: 50 ug via INTRAVENOUS
  Filled 2016-01-31: qty 2

## 2016-01-31 MED ORDER — HYDROMORPHONE HCL 2 MG/ML IJ SOLN
1.0000 mg | Freq: Once | INTRAMUSCULAR | Status: AC
  Administered 2016-01-31: 1 mg via INTRAVENOUS
  Filled 2016-01-31: qty 1

## 2016-01-31 MED ORDER — LORAZEPAM 2 MG/ML IJ SOLN: 1.0000 mg | Freq: Four times a day (QID) | INTRAMUSCULAR | Status: AC | PRN

## 2016-01-31 MED ORDER — LORAZEPAM 2 MG/ML IJ SOLN: 0.0000 mg | Freq: Two times a day (BID) | INTRAMUSCULAR | Status: AC

## 2016-01-31 MED ORDER — VITAMIN B-1 100 MG PO TABS
100.0000 mg | ORAL_TABLET | Freq: Every day | ORAL | Status: DC
  Administered 2016-01-31 – 2016-02-04 (×5): 100 mg via ORAL
  Filled 2016-01-31 (×5): qty 1

## 2016-01-31 MED ORDER — SODIUM CHLORIDE 0.9 % IV SOLN
INTRAVENOUS | Status: AC
  Administered 2016-01-31: 11:00:00 via INTRAVENOUS

## 2016-01-31 MED ORDER — DEXTROSE-NACL 5-0.45 % IV SOLN
INTRAVENOUS | Status: DC
  Administered 2016-01-31: 08:00:00 via INTRAVENOUS

## 2016-01-31 MED ORDER — THIAMINE HCL 100 MG/ML IJ SOLN
100.0000 mg | Freq: Every day | INTRAMUSCULAR | Status: DC
  Filled 2016-01-31: qty 2

## 2016-01-31 MED ORDER — LORAZEPAM 1 MG PO TABS: 1.0000 mg | ORAL_TABLET | Freq: Four times a day (QID) | ORAL | Status: AC | PRN

## 2016-01-31 MED ORDER — ACETAMINOPHEN 325 MG PO TABS
650.0000 mg | ORAL_TABLET | Freq: Four times a day (QID) | ORAL | Status: DC | PRN
  Administered 2016-01-31: 650 mg via ORAL
  Filled 2016-01-31 (×2): qty 2

## 2016-01-31 MED ORDER — HYDROMORPHONE HCL 2 MG/ML IJ SOLN
1.0000 mg | INTRAMUSCULAR | Status: DC | PRN
  Administered 2016-01-31 – 2016-02-03 (×13): 1 mg via INTRAVENOUS
  Filled 2016-01-31 (×14): qty 1

## 2016-01-31 MED ORDER — ONDANSETRON HCL 4 MG/2ML IJ SOLN
4.0000 mg | Freq: Four times a day (QID) | INTRAMUSCULAR | Status: DC | PRN
  Administered 2016-02-01: 4 mg via INTRAVENOUS
  Filled 2016-01-31: qty 2

## 2016-01-31 MED ORDER — FOLIC ACID 1 MG PO TABS
1.0000 mg | ORAL_TABLET | Freq: Every day | ORAL | Status: DC
  Administered 2016-01-31 – 2016-02-04 (×5): 1 mg via ORAL
  Filled 2016-01-31 (×5): qty 1

## 2016-01-31 MED ORDER — DEXTROSE-NACL 5-0.45 % IV SOLN
INTRAVENOUS | Status: DC
  Administered 2016-01-31: 04:00:00 via INTRAVENOUS

## 2016-01-31 MED ORDER — HYDRALAZINE HCL 20 MG/ML IJ SOLN: 10.0000 mg | INTRAMUSCULAR | Status: DC | PRN

## 2016-01-31 MED ORDER — SODIUM CHLORIDE 0.9 % IV SOLN
INTRAVENOUS | Status: DC
  Administered 2016-01-31: 23:00:00 via INTRAVENOUS

## 2016-01-31 MED ORDER — ACETAMINOPHEN 650 MG RE SUPP: 650.0000 mg | Freq: Four times a day (QID) | RECTAL | Status: DC | PRN

## 2016-01-31 MED ORDER — ADULT MULTIVITAMIN W/MINERALS CH
1.0000 | ORAL_TABLET | Freq: Every day | ORAL | Status: DC
  Administered 2016-01-31 – 2016-02-04 (×5): 1 via ORAL
  Filled 2016-01-31 (×5): qty 1

## 2016-01-31 MED ORDER — LORAZEPAM 2 MG/ML IJ SOLN: 0.0000 mg | Freq: Four times a day (QID) | INTRAMUSCULAR | Status: AC

## 2016-01-31 MED ORDER — ONDANSETRON HCL 4 MG PO TABS: 4.0000 mg | ORAL_TABLET | Freq: Four times a day (QID) | ORAL | Status: DC | PRN

## 2016-01-31 MED ORDER — ENOXAPARIN SODIUM 40 MG/0.4ML ~~LOC~~ SOLN: 40.0000 mg | SUBCUTANEOUS | Status: DC

## 2016-01-31 NOTE — Progress Notes (Signed)
Pt admitted after midnight, please see earlier admission note by Dr. Hal Hope. Pt admitted for evaluation of abd paint that is suspected to be duet oa cute pancreatitis in the setting of alcohol use. PT on IVF, hemodynamically stable, keep NPO for now. CMET and CBC in AM.   Faye Ramsay, MD  [facility] Pager [phone]  If 7PM-7AM, please contact night-coverage [email or web address] Password [email or web address]
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
## 2016-01-31 NOTE — ED Notes (Signed)
Attempted to call report

## 2016-01-31 NOTE — ED Notes (Signed)
Patient transported to CT 

## 2016-01-31 NOTE — ED Notes (Signed)
Pt provided with cranberry juice.

## 2016-02-01 LAB — COMPREHENSIVE METABOLIC PANEL
ALT: 23 U/L (ref 17–63)
ANION GAP: 8 (ref 5–15)
AST: 22 U/L (ref 15–41)
Albumin: 3.2 g/dL — ABNORMAL LOW (ref 3.5–5.0)
Alkaline Phosphatase: 67 U/L (ref 38–126)
BUN: 8 mg/dL (ref 6–20)
CHLORIDE: 102 mmol/L (ref 101–111)
CO2: 23 mmol/L (ref 22–32)
Calcium: 8.8 mg/dL — ABNORMAL LOW (ref 8.9–10.3)
Creatinine, Ser: 1.1 mg/dL (ref 0.61–1.24)
Glucose, Bld: 80 mg/dL (ref 65–99)
POTASSIUM: 3.9 mmol/L (ref 3.5–5.1)
SODIUM: 133 mmol/L — AB (ref 135–145)
Total Bilirubin: 0.8 mg/dL (ref 0.3–1.2)
Total Protein: 6.2 g/dL — ABNORMAL LOW (ref 6.5–8.1)

## 2016-02-01 LAB — CBC
HCT: 42.3 % (ref 39.0–52.0)
Hemoglobin: 14.3 g/dL (ref 13.0–17.0)
MCH: 31.6 pg (ref 26.0–34.0)
MCHC: 33.8 g/dL (ref 30.0–36.0)
MCV: 93.4 fL (ref 78.0–100.0)
PLATELETS: 171 10*3/uL (ref 150–400)
RBC: 4.53 MIL/uL (ref 4.22–5.81)
RDW: 15.4 % (ref 11.5–15.5)
WBC: 10.6 10*3/uL — ABNORMAL HIGH (ref 4.0–10.5)

## 2016-02-01 LAB — LIPASE, BLOOD: LIPASE: 218 U/L — AB (ref 11–51)

## 2016-02-01 NOTE — Progress Notes (Signed)
Patient ID: Ryan [name], male   DOB: 1950/06/12, 65 y.o.   MRN: [id number]    PROGRESS NOTE    SIPRIANO CANCEL  [id number] DOB: [birth year] DOA: 01/31/2016  PCP: [name], MD   Brief Narrative:   65 y.o. male with history of polysubstance abuse, nonischemic cardiomyopathy last EF measured in September 2017 during cardiac cath was 10-15% presents to the ER with complaints of abdominal pain and nausea vomiting. Patient has been having epigastric pain radiating to the back stabbing in nature for the last 3 days. Has been having associated nausea, vomiting.  Assessment & Plan:   Principal Problem:   Acute pancreatitis - in the setting of alcohol abuse - reports feeling better but still with abd pain  - we can try to allow clear liquids as pt thinks part of the pain is coming from hunger - allow analgesia as needed, allow antiemetics as needed - stop IVF for now as pt euvolemic on exam and has hx of systolic CHF  Active Problems:   Chronic systolic heart failure (HCC) - EF ~ 15% based on recent cath - no signs of volume overload, monitor daily weights - strict I/O - stop IVF    CKD (chronic kidney disease), stage III - Cr now WNL - BMP in AM    HTN (hypertension), essential - reasonable inpatient control     COPD (chronic obstructive pulmonary disease) (HCC) - stable respiratory status this AM    Polysubstance abuse - counseling provided on cessation of alcohol use  - pt verbalized understanding   DVT prophylaxis: SCD's Code Status: Full  Family Communication: Patient at bedside  Disposition Plan: Home in 2-3 days   Consultants:   None  Procedures:   None  Antimicrobials:   None   Subjective: Reports feeling better but still with abd pain worse in the LUQ area, 5/10 in severity after pain medication.   Objective: Vitals:   01/31/16 2130 02/01/16 0525 02/01/16 0856 02/01/16 0941  BP:  (!) 141/90 131/74 131/74  Pulse:  95 94 94  Resp: 18 18   (!) 26  Temp: 99.2 F (37.3 C) 97.8 F (36.6 C)  97.9 F (36.6 C)  TempSrc: Oral Oral  Oral  SpO2: 97% 100%  94%  Weight:  91.9 kg (202 lb 9.6 oz)    Height:        Intake/Output Summary (Last 24 hours) at 02/01/16 1026 Last data filed at 02/01/16 0554  Gross per 24 hour  Intake             1425 ml  Output              525 ml  Net              900 ml   Filed Weights   01/31/16 1113 02/01/16 0525  Weight: 91.4 kg (201 lb 9.6 oz) 91.9 kg (202 lb 9.6 oz)    Examination:  General exam: Appears calm and comfortable  Respiratory system: Respiratory effort normal. Cardiovascular system: S1 & S2 heard, RRR. No JVD, murmurs, rubs, gallops or clicks.  Gastrointestinal system: Abdomen is slightly distended, soft and mildly tender in LUQ area. No organomegaly or masses felt.  Central nervous system: Alert and oriented. No focal neurological deficits.  Data Reviewed: I have personally reviewed following labs and imaging studies  CBC:  Recent Labs Lab 01/30/16 2330 01/31/16 0558 02/01/16 0516  WBC 12.1* 10.4 10.6*  NEUTROABS  --  8.3*  --  HGB 15.3 14.2 14.3  HCT 44.4 42.5 42.3  MCV 92.7 92.8 93.4  PLT 214 189 [id number]   Basic Metabolic Panel:  Recent Labs Lab 01/30/16 2330 01/31/16 0558 02/01/16 0516  NA 133* 134* 133*  K 4.1 4.2 3.9  CL 102 101 102  CO2 22 24 23   GLUCOSE 104* 104* 80  BUN 13 11 8   CREATININE 1.26* 1.28* 1.10  CALCIUM 9.2 9.2 8.8*   Liver Function Tests:  Recent Labs Lab 01/30/16 2337 01/31/16 0558 02/01/16 0516  AST 34 31 22  ALT 40 34 23  ALKPHOS 77 76 67  BILITOT 0.8 0.8 0.8  PROT 7.1 6.6 6.2*  ALBUMIN 3.8 3.6 3.2*    Recent Labs Lab 01/30/16 2337 02/01/16 0516  LIPASE 104* 218*   Coagulation Profile:  Recent Labs Lab 01/31/16 0126  INR 1.03   Cardiac Enzymes:  Recent Labs Lab 01/31/16 0558  TROPONINI 0.05*   CBG:  Recent Labs Lab 01/31/16 0944  GLUCAP 133*   Lipid Profile:  Recent Labs  01/31/16 0558    CHOL 147  HDL 49  LDLCALC 67  TRIG 157*  CHOLHDL 3.0   Radiology Studies: Ct Abdomen Pelvis Wo Contrast Result Date: 01/31/2016 Vague soft tissue inflammation about the distal body and tail of the pancreas, with trace fluid tracking inferiorly along Gerota's fascia on the left. This is concerning for acute pancreatitis. No evidence for pseudocyst formation. 2. Scattered aortic atherosclerosis. 3. Scattered diverticulosis along the descending colon, without evidence of diverticulitis. 4. Nonobstructing right renal stones measure up to 1.0 cm in size. Small bilateral renal cysts seen. 5. Mild bibasilar atelectasis noted. 6. Mild asymmetric atrophy of the left psoas and iliacus musculature, and chronic deformity about the pelvis and proximal left femur.   Dg Chest 2 View Result Date: 01/31/2016 Linear atelectasis or fibrosis in the lung bases. Cardiac enlargement with mild fluid in the fissures. No vascular congestion or edema.    Scheduled Meds: . folic acid  1 mg Oral Daily  . LORazepam  0-4 mg Intravenous Q6H   Followed by  . [START ON 02/02/2016] LORazepam  0-4 mg Intravenous Q12H  . multivitamin with minerals  1 tablet Oral Daily  . thiamine  100 mg Oral Daily   Or  . thiamine  100 mg Intravenous Daily   Continuous Infusions:   LOS: 1 day    Time spent: 20 minutes    Faye Ramsay, MD [facility] Pager [phone]  If 7PM-7AM, please contact night-coverage [email or web address] Password [email or web address] 02/01/2016, 10:26 AM

## 2016-02-02 DIAGNOSIS — I10 Essential (primary) hypertension: Secondary | ICD-10-CM

## 2016-02-02 LAB — BASIC METABOLIC PANEL
Anion gap: 10 (ref 5–15)
BUN: 5 mg/dL — AB (ref 6–20)
CHLORIDE: 101 mmol/L (ref 101–111)
CO2: 25 mmol/L (ref 22–32)
CREATININE: 0.99 mg/dL (ref 0.61–1.24)
Calcium: 9.2 mg/dL (ref 8.9–10.3)
GFR calc Af Amer: >60 mL/min (ref >60)
GFR calc non Af Amer: >60 mL/min (ref >60)
GLUCOSE: 95 mg/dL (ref 65–99)
POTASSIUM: 3.7 mmol/L (ref 3.5–5.1)
Sodium: 136 mmol/L (ref 135–145)

## 2016-02-02 LAB — CBC
HCT: 41.8 % (ref 39.0–52.0)
HEMOGLOBIN: 14.2 g/dL (ref 13.0–17.0)
MCH: 31.8 pg (ref 26.0–34.0)
MCHC: 34 g/dL (ref 30.0–36.0)
MCV: 93.7 fL (ref 78.0–100.0)
Platelets: 186 10*3/uL (ref 150–400)
RBC: 4.46 MIL/uL (ref 4.22–5.81)
RDW: 15.1 % (ref 11.5–15.5)
WBC: 9.8 10*3/uL (ref 4.0–10.5)

## 2016-02-02 LAB — LIPASE, BLOOD: LIPASE: 48 U/L (ref 11–51)

## 2016-02-02 MED ORDER — SENNOSIDES-DOCUSATE SODIUM 8.6-50 MG PO TABS
2.0000 | ORAL_TABLET | Freq: Once | ORAL | Status: AC
  Administered 2016-02-02: 2 via ORAL
  Filled 2016-02-02: qty 2

## 2016-02-02 MED ORDER — ENOXAPARIN SODIUM 40 MG/0.4ML ~~LOC~~ SOLN
40.0000 mg | SUBCUTANEOUS | Status: DC
  Filled 2016-02-02: qty 0.4

## 2016-02-02 MED ORDER — ALUM & MAG HYDROXIDE-SIMETH 200-200-20 MG/5ML PO SUSP: 30.0000 mL | Freq: Four times a day (QID) | ORAL | Status: DC | PRN

## 2016-02-02 MED ORDER — FUROSEMIDE 20 MG PO TABS
20.0000 mg | ORAL_TABLET | Freq: Every day | ORAL | Status: DC
  Administered 2016-02-02 – 2016-02-04 (×3): 20 mg via ORAL
  Filled 2016-02-02 (×3): qty 1

## 2016-02-02 MED ORDER — PANTOPRAZOLE SODIUM 40 MG PO TBEC
40.0000 mg | DELAYED_RELEASE_TABLET | Freq: Every day | ORAL | Status: DC
  Administered 2016-02-03: 40 mg via ORAL
  Filled 2016-02-02: qty 1

## 2016-02-02 MED ORDER — POLYETHYLENE GLYCOL 3350 17 G PO PACK
17.0000 g | PACK | Freq: Every day | ORAL | Status: DC
  Administered 2016-02-02 – 2016-02-03 (×2): 17 g via ORAL
  Filled 2016-02-02 (×2): qty 1

## 2016-02-02 NOTE — Progress Notes (Signed)
PROGRESS NOTE        PATIENT DETAILS Name: Ryan [name] Age: 65 y.o. Sex: male Date of Birth: 08-Mar-1951 Admit Date: 01/31/2016 Admitting Physician Rise Patience, MD [id number], MD (Inactive)  Brief Narrative: Patient is a 65 y.o. male with known history of alcohol use-presented with abdominal pain, found to have pancreatitis. Admitted for further evaluation and treatment-see below for further details.  Subjective: Although better-continues to have epigastric pain.  Assessment/Plan: Acute pancreatitis: Suspected alcoholic etiology-does acknowledge binging alcohol approximately a week back-claims a good friend of his passed away. Seems to be slowly improving with supportive care, abdominal pain although still persistent has markedly improved. Able to tolerate some clear liquids-continue supportive care- slowly advance diet.  Chronic systolic heart failure (EF by TTE on 11/24/15 around 20-25%): Although no significant edema on exam-weight seems to be slowly creeping up-his weight on his most recent hospital discharge in September was around 193 pounds-and resume Lasix and Aldactone and follow. Per cardiology note (discharge summary 11/27/15)-not felt to be a candidate for ICD given unknown prognoses, continued alcohol abuse and noncompliance.   Hypertension: Well-controlled-continue to hold losartan-we will follow his blood pressure while on diuretics.  History of alcohol abuse: Counseled extensively, last drink was approximately 2 days prior to admission-no signs of alcohol withdrawal currently. As noted above, approximately 1 week prior to this admission-patient acknowledges binging alcohol.  DVT Prophylaxis: Begin Prophylactic Lovenox   Code Status: Full code  Family Communication: None at bedside  Disposition Plan: Remain inpatient-suspect requires 2-3 more days of hospitalization before discharge  Antimicrobial  agents: None  Procedures: None  CONSULTS:  None  Time spent: 25 minutes-Greater than 50% of this time was spent in counseling, explanation of diagnosis, planning of further management, and coordination of care.  MEDICATIONS: Anti-infectives    None      Scheduled Meds: . folic acid  1 mg Oral Daily  . LORazepam  0-4 mg Intravenous Q12H  . multivitamin with minerals  1 tablet Oral Daily  . polyethylene glycol  17 g Oral Daily  . thiamine  100 mg Oral Daily   Or  . thiamine  100 mg Intravenous Daily   Continuous Infusions: PRN Meds:.acetaminophen **OR** acetaminophen, hydrALAZINE, HYDROmorphone (DILAUDID) injection, LORazepam **OR** LORazepam, ondansetron **OR** ondansetron (ZOFRAN) IV   PHYSICAL EXAM: Vital signs: Vitals:   02/01/16 2051 02/02/16 0541 02/02/16 0545 02/02/16 1440  BP: 128/77  (!) 121/95 112/69  Pulse: (!) 117  (!) 109 98  Resp: 18  18 18   Temp: 98.8 F (37.1 C)  97.5 F (36.4 C) 98.6 F (37 C)  TempSrc: Oral  Oral   SpO2: 100%  100%   Weight:  91.4 kg (201 lb 8 oz)    Height:       Filed Weights   01/31/16 1113 02/01/16 0525 02/02/16 0541  Weight: 91.4 kg (201 lb 9.6 oz) 91.9 kg (202 lb 9.6 oz) 91.4 kg (201 lb 8 oz)   Body mass index is 28.1 kg/m.   General appearance :Awake, alert, not in any distress. Speech Clear. Not toxic Looking Eyes:, pupils equally reactive to light and accomodation,no scleral icterus.Pink conjunctiva HEENT: Atraumatic and Normocephalic Neck: supple, no JVD. No cervical lymphadenopathy. No thyromegaly Resp:Good air entry bilaterally, no added sounds  CVS: S1 S2 regular, GI: Bowel sounds present, Mildly tender epigastric area without any peritoneal  signs Extremities: B/L Lower Ext shows trace edema, both legs are warm to touch Neurology:  speech clear,Non focal, sensation is grossly intact. Psychiatric: Normal judgment and insight. Alert and oriented x 3. Normal mood. Musculoskeletal:No digital cyanosis Skin:No  Rash, warm and dry Wounds:N/A  I have personally reviewed following labs and imaging studies  LABORATORY DATA: CBC:  Recent Labs Lab 01/30/16 2330 01/31/16 0558 02/01/16 0516 02/02/16 0807  WBC 12.1* 10.4 10.6* 9.8  NEUTROABS  --  8.3*  --   --   HGB 15.3 14.2 14.3 14.2  HCT 44.4 42.5 42.3 41.8  MCV 92.7 92.8 93.4 93.7  PLT 214 189 171 [id number]    Basic Metabolic Panel:  Recent Labs Lab 01/30/16 2330 01/31/16 0558 02/01/16 0516 02/02/16 0807  NA 133* 134* 133* 136  K 4.1 4.2 3.9 3.7  CL 102 101 102 101  CO2 22 24 23 25   GLUCOSE 104* 104* 80 95  BUN 13 11 8  5*  CREATININE 1.26* 1.28* 1.10 0.99  CALCIUM 9.2 9.2 8.8* 9.2    GFR: Estimated Creatinine Clearance: 84.8 mL/min (by C-G formula based on SCr of 0.99 mg/dL).  Liver Function Tests:  Recent Labs Lab 01/30/16 2337 01/31/16 0558 02/01/16 0516  AST 34 31 22  ALT 40 34 23  ALKPHOS 77 76 67  BILITOT 0.8 0.8 0.8  PROT 7.1 6.6 6.2*  ALBUMIN 3.8 3.6 3.2*    Recent Labs Lab 01/30/16 2337 02/01/16 0516 02/02/16 0807  LIPASE 104* 218* 48   No results for input(s): AMMONIA in the last 168 hours.  Coagulation Profile:  Recent Labs Lab 01/31/16 0126  INR 1.03    Cardiac Enzymes:  Recent Labs Lab 01/31/16 0558  TROPONINI 0.05*    BNP (last 3 results) No results for input(s): PROBNP in the last 8760 hours.  HbA1C: No results for input(s): HGBA1C in the last 72 hours.  CBG:  Recent Labs Lab 01/31/16 0944  GLUCAP 133*    Lipid Profile:  Recent Labs  01/31/16 0558  CHOL 147  HDL 49  LDLCALC 67  TRIG 157*  CHOLHDL 3.0    Thyroid Function Tests: No results for input(s): TSH, T4TOTAL, FREET4, T3FREE, THYROIDAB in the last 72 hours.  Anemia Panel: No results for input(s): VITAMINB12, FOLATE, FERRITIN, TIBC, IRON, RETICCTPCT in the last 72 hours.  Urine analysis:    Component Value Date/Time   COLORURINE YELLOW 04/23/2013 2034   APPEARANCEUR TURBID (A) 04/23/2013 2034    LABSPEC 1.028 04/23/2013 2034   PHURINE 5.5 04/23/2013 2034   GLUCOSEU NEGATIVE 04/23/2013 2034   HGBUR TRACE (A) 04/23/2013 2034   BILIRUBINUR NEGATIVE 04/23/2013 2034   KETONESUR NEGATIVE 04/23/2013 2034   PROTEINUR NEGATIVE 04/23/2013 2034   UROBILINOGEN 0.2 04/23/2013 2034   NITRITE POSITIVE (A) 04/23/2013 2034   LEUKOCYTESUR MODERATE (A) 04/23/2013 2034    Sepsis Labs: Lactic Acid, Venous No results found for: LATICACIDVEN  MICROBIOLOGY: No results found for this or any previous visit (from the past 240 hour(s)).  RADIOLOGY STUDIES/RESULTS: Ct Abdomen Pelvis Wo Contrast  Result Date: 01/31/2016 CLINICAL DATA:  Acute onset of left upper quadrant abdominal pain. Initial encounter. EXAM: CT ABDOMEN AND PELVIS WITHOUT CONTRAST TECHNIQUE: Multidetector CT imaging of the abdomen and pelvis was performed following the standard protocol without IV contrast. COMPARISON:  Abdominal radiograph performed 04/23/2013 FINDINGS: Lower chest: Mild bibasilar atelectasis is noted. The visualized portions of the mediastinum are unremarkable. Hepatobiliary: The liver is unremarkable in appearance. The gallbladder is unremarkable in appearance.  The common bile duct remains normal in caliber. Pancreas: Vague soft tissue inflammation is noted about the distal body and tail of the pancreas, with trace fluid tracking inferiorly along Gerota's fascia on the left. This is concerning for acute pancreatitis. There is no evidence of pseudocyst formation. Evaluation for devascularization is limited without contrast. Spleen: The spleen is unremarkable in appearance. Adrenals/Urinary Tract: The adrenal glands are unremarkable in appearance. The kidneys are within normal limits. There is no evidence of hydronephrosis. No renal or ureteral stones are identified. No perinephric stranding is seen. Multiple nonobstructing right renal stones are seen, measuring up to 1.0 cm in size. Small bilateral renal cysts are seen.  Nonspecific perinephric stranding is noted bilaterally. There is no evidence of hydronephrosis. No obstructing ureteral stones are seen. Stomach/Bowel: The stomach is unremarkable in appearance. The small bowel is within normal limits. The appendix is normal in caliber, without evidence of appendicitis. Scattered diverticulosis is noted along the descending colon, without evidence of diverticulitis. Vascular/Lymphatic: Scattered calcification is seen along the abdominal aorta and its branches. The abdominal aorta is otherwise grossly unremarkable. The inferior vena cava is grossly unremarkable. No retroperitoneal lymphadenopathy is seen. No pelvic sidewall lymphadenopathy is identified. Reproductive: The bladder is mildly distended and grossly unremarkable. The prostate remains normal in size. Scattered calcification is noted within the prostate. Other: There is mild asymmetric atrophy of the left psoas and iliacus muscles, and extensive chronic deformity at the proximal left femur, with surrounding heterotopic bone formation. Musculoskeletal: No acute osseous abnormalities are seen. Chronic deformities are noted at the superior and inferior pubic rami bilaterally. IMPRESSION: 1. Vague soft tissue inflammation about the distal body and tail of the pancreas, with trace fluid tracking inferiorly along Gerota's fascia on the left. This is concerning for acute pancreatitis. No evidence for pseudocyst formation. 2. Scattered aortic atherosclerosis. 3. Scattered diverticulosis along the descending colon, without evidence of diverticulitis. 4. Nonobstructing right renal stones measure up to 1.0 cm in size. Small bilateral renal cysts seen. 5. Mild bibasilar atelectasis noted. 6. Mild asymmetric atrophy of the left psoas and iliacus musculature, and chronic deformity about the pelvis and proximal left femur. Electronically Signed   By: Garald Balding M.D.   On: 01/31/2016 03:39   Dg Chest 2 View  Result Date:  01/31/2016 CLINICAL DATA:  Left-sided chest and arm pain tonight. Nausea. History of hypertension. EXAM: CHEST  2 VIEW COMPARISON:  11/22/2015 FINDINGS: Shallow inspiration. Linear atelectasis or fibrosis in both lung bases. Mild cardiac enlargement without vascular congestion. No edema or consolidation in the lungs. Small amount of fluid in the fissures. No focal consolidation or airspace disease. No pneumothorax. Mediastinal contours appear intact. IMPRESSION: Linear atelectasis or fibrosis in the lung bases. Cardiac enlargement with mild fluid in the fissures. No vascular congestion or edema. Electronically Signed   By: Lucienne Capers M.D.   On: 01/31/2016 00:08     LOS: 2 days   Oren Binet, MD  [facility] Pager:[phone]  If 7PM-7AM, please contact night-coverage [email or web address] Password [facility] 02/02/2016, 3:32 PM

## 2016-02-03 MED ORDER — HYDROCORTISONE 0.5 % EX CREA
TOPICAL_CREAM | CUTANEOUS | Status: DC | PRN
  Administered 2016-02-03: 1 via TOPICAL
  Filled 2016-02-03: qty 28.35

## 2016-02-03 MED ORDER — OXYCODONE HCL 5 MG PO TABS
5.0000 mg | ORAL_TABLET | ORAL | Status: DC | PRN
  Administered 2016-02-03: 5 mg via ORAL
  Administered 2016-02-03 – 2016-02-04 (×4): 10 mg via ORAL
  Filled 2016-02-03 (×4): qty 2
  Filled 2016-02-03: qty 1

## 2016-02-03 NOTE — Progress Notes (Signed)
PROGRESS NOTE        PATIENT DETAILS Name: Ryan [name] Age: 65 y.o. Sex: male Date of Birth: 01-Dec-1950 Admit Date: 01/31/2016 Admitting Physician Rise Patience, MD [id number], MD (Inactive)  Brief Narrative: Patient is a 65 y.o. male with known history of alcohol use-presented with abdominal pain, found to have pancreatitis. Admitted for further evaluation and treatment-see below for further details.  Subjective: Abd pain continues to improve-claims to be tolerating clear liquids well.  Assessment/Plan: Acute pancreatitis: Suspected alcoholic etiology-does acknowledge binging alcohol approximately a week prior to this admission. Seems to be slowly improving with supportive care, abdominal pain is markedly improved, advance to full liquids. Suspect that if improvement continues, he should be able to be discharged on 11/24.  Chronic systolic heart failure (EF by TTE on 11/24/15 around 20-25%): Compensated, continue Lasix and Aldactone. Per cardiology note (discharge summary 11/27/15)-not felt to be a candidate for ICD given unknown prognoses, continued alcohol abuse and noncompliance.   Hypertension: Well-controlled-continue to hold losartan-we will follow his blood pressure while on diuretics.  History of alcohol abuse: Counseled extensively, last drink was approximately 2 days prior to admission-no signs of alcohol withdrawal currently. As noted above, approximately 1 week prior to this admission-patient acknowledges binging alcohol.  DVT Prophylaxis:  Prophylactic Lovenox   Code Status: Full code  Family Communication: None at bedside  Disposition Plan: Remain inpatient- home likely on 11/24 if clinical improvement continues   Antimicrobial agents: None  Procedures: None  CONSULTS:  None  Time spent: 25 minutes-Greater than 50% of this time was spent in counseling, explanation of diagnosis, planning of further management, and  coordination of care.  MEDICATIONS: Anti-infectives    None      Scheduled Meds: . enoxaparin (LOVENOX) injection  40 mg Subcutaneous Q24H  . folic acid  1 mg Oral Daily  . furosemide  20 mg Oral Daily  . LORazepam  0-4 mg Intravenous Q12H  . multivitamin with minerals  1 tablet Oral Daily  . pantoprazole  40 mg Oral Q1200  . polyethylene glycol  17 g Oral Daily  . thiamine  100 mg Oral Daily   Or  . thiamine  100 mg Intravenous Daily   Continuous Infusions: PRN Meds:.acetaminophen **OR** acetaminophen, alum & mag hydroxide-simeth, hydrALAZINE, HYDROmorphone (DILAUDID) injection, ondansetron **OR** ondansetron (ZOFRAN) IV, oxyCODONE   PHYSICAL EXAM: Vital signs: Vitals:   02/02/16 1440 02/02/16 2217 02/03/16 0559 02/03/16 1156  BP: 112/69 123/76 (!) 142/93 (!) 144/89  Pulse: 98 (!) 106 97 (!) 103  Resp: 18 18 18 20   Temp: 98.6 F (37 C) 97.9 F (36.6 C) 97.4 F (36.3 C) 97.6 F (36.4 C)  TempSrc:  Oral Oral   SpO2:  98% 100% 98%  Weight:      Height:       Filed Weights   01/31/16 1113 02/01/16 0525 02/02/16 0541  Weight: 91.4 kg (201 lb 9.6 oz) 91.9 kg (202 lb 9.6 oz) 91.4 kg (201 lb 8 oz)   Body mass index is 28.1 kg/m.   General appearance :Awake, alert, not in any distress. Speech Clear. Not toxic Looking Eyes:, pupils equally reactive to light and accomodation,no scleral icterus.Pink conjunctiva HEENT: Atraumatic and Normocephalic Neck: supple, no JVD. No cervical lymphadenopathy. No thyromegaly Resp:Good air entry bilaterally, no added sounds  CVS: S1 S2 regular, GI: Bowel sounds present,  hardly any tenderness in the epigastric area Extremities: B/L Lower Ext shows trace edema, both legs are warm to touch Neurology:  speech clear,Non focal, sensation is grossly intact. Psychiatric: Normal judgment and insight. Alert and oriented x 3. Normal mood. Musculoskeletal:No digital cyanosis Skin:No Rash, warm and dry Wounds:N/A  I have personally reviewed  following labs and imaging studies  LABORATORY DATA: CBC:  Recent Labs Lab 01/30/16 2330 01/31/16 0558 02/01/16 0516 02/02/16 0807  WBC 12.1* 10.4 10.6* 9.8  NEUTROABS  --  8.3*  --   --   HGB 15.3 14.2 14.3 14.2  HCT 44.4 42.5 42.3 41.8  MCV 92.7 92.8 93.4 93.7  PLT 214 189 171 [id number]    Basic Metabolic Panel:  Recent Labs Lab 01/30/16 2330 01/31/16 0558 02/01/16 0516 02/02/16 0807  NA 133* 134* 133* 136  K 4.1 4.2 3.9 3.7  CL 102 101 102 101  CO2 22 24 23 25   GLUCOSE 104* 104* 80 95  BUN 13 11 8  5*  CREATININE 1.26* 1.28* 1.10 0.99  CALCIUM 9.2 9.2 8.8* 9.2    GFR: Estimated Creatinine Clearance: 84.8 mL/min (by C-G formula based on SCr of 0.99 mg/dL).  Liver Function Tests:  Recent Labs Lab 01/30/16 2337 01/31/16 0558 02/01/16 0516  AST 34 31 22  ALT 40 34 23  ALKPHOS 77 76 67  BILITOT 0.8 0.8 0.8  PROT 7.1 6.6 6.2*  ALBUMIN 3.8 3.6 3.2*    Recent Labs Lab 01/30/16 2337 02/01/16 0516 02/02/16 0807  LIPASE 104* 218* 48   No results for input(s): AMMONIA in the last 168 hours.  Coagulation Profile:  Recent Labs Lab 01/31/16 0126  INR 1.03    Cardiac Enzymes:  Recent Labs Lab 01/31/16 0558  TROPONINI 0.05*    BNP (last 3 results) No results for input(s): PROBNP in the last 8760 hours.  HbA1C: No results for input(s): HGBA1C in the last 72 hours.  CBG:  Recent Labs Lab 01/31/16 0944  GLUCAP 133*    Lipid Profile: No results for input(s): CHOL, HDL, LDLCALC, TRIG, CHOLHDL, LDLDIRECT in the last 72 hours.  Thyroid Function Tests: No results for input(s): TSH, T4TOTAL, FREET4, T3FREE, THYROIDAB in the last 72 hours.  Anemia Panel: No results for input(s): VITAMINB12, FOLATE, FERRITIN, TIBC, IRON, RETICCTPCT in the last 72 hours.  Urine analysis:    Component Value Date/Time   COLORURINE YELLOW 04/23/2013 2034   APPEARANCEUR TURBID (A) 04/23/2013 2034   LABSPEC 1.028 04/23/2013 2034   PHURINE 5.5 04/23/2013 2034    GLUCOSEU NEGATIVE 04/23/2013 2034   HGBUR TRACE (A) 04/23/2013 2034   BILIRUBINUR NEGATIVE 04/23/2013 2034   KETONESUR NEGATIVE 04/23/2013 2034   PROTEINUR NEGATIVE 04/23/2013 2034   UROBILINOGEN 0.2 04/23/2013 2034   NITRITE POSITIVE (A) 04/23/2013 2034   LEUKOCYTESUR MODERATE (A) 04/23/2013 2034    Sepsis Labs: Lactic Acid, Venous No results found for: LATICACIDVEN  MICROBIOLOGY: No results found for this or any previous visit (from the past 240 hour(s)).  RADIOLOGY STUDIES/RESULTS: Ct Abdomen Pelvis Wo Contrast  Result Date: 01/31/2016 CLINICAL DATA:  Acute onset of left upper quadrant abdominal pain. Initial encounter. EXAM: CT ABDOMEN AND PELVIS WITHOUT CONTRAST TECHNIQUE: Multidetector CT imaging of the abdomen and pelvis was performed following the standard protocol without IV contrast. COMPARISON:  Abdominal radiograph performed 04/23/2013 FINDINGS: Lower chest: Mild bibasilar atelectasis is noted. The visualized portions of the mediastinum are unremarkable. Hepatobiliary: The liver is unremarkable in appearance. The gallbladder is unremarkable in appearance. The common  bile duct remains normal in caliber. Pancreas: Vague soft tissue inflammation is noted about the distal body and tail of the pancreas, with trace fluid tracking inferiorly along Gerota's fascia on the left. This is concerning for acute pancreatitis. There is no evidence of pseudocyst formation. Evaluation for devascularization is limited without contrast. Spleen: The spleen is unremarkable in appearance. Adrenals/Urinary Tract: The adrenal glands are unremarkable in appearance. The kidneys are within normal limits. There is no evidence of hydronephrosis. No renal or ureteral stones are identified. No perinephric stranding is seen. Multiple nonobstructing right renal stones are seen, measuring up to 1.0 cm in size. Small bilateral renal cysts are seen. Nonspecific perinephric stranding is noted bilaterally. There is no  evidence of hydronephrosis. No obstructing ureteral stones are seen. Stomach/Bowel: The stomach is unremarkable in appearance. The small bowel is within normal limits. The appendix is normal in caliber, without evidence of appendicitis. Scattered diverticulosis is noted along the descending colon, without evidence of diverticulitis. Vascular/Lymphatic: Scattered calcification is seen along the abdominal aorta and its branches. The abdominal aorta is otherwise grossly unremarkable. The inferior vena cava is grossly unremarkable. No retroperitoneal lymphadenopathy is seen. No pelvic sidewall lymphadenopathy is identified. Reproductive: The bladder is mildly distended and grossly unremarkable. The prostate remains normal in size. Scattered calcification is noted within the prostate. Other: There is mild asymmetric atrophy of the left psoas and iliacus muscles, and extensive chronic deformity at the proximal left femur, with surrounding heterotopic bone formation. Musculoskeletal: No acute osseous abnormalities are seen. Chronic deformities are noted at the superior and inferior pubic rami bilaterally. IMPRESSION: 1. Vague soft tissue inflammation about the distal body and tail of the pancreas, with trace fluid tracking inferiorly along Gerota's fascia on the left. This is concerning for acute pancreatitis. No evidence for pseudocyst formation. 2. Scattered aortic atherosclerosis. 3. Scattered diverticulosis along the descending colon, without evidence of diverticulitis. 4. Nonobstructing right renal stones measure up to 1.0 cm in size. Small bilateral renal cysts seen. 5. Mild bibasilar atelectasis noted. 6. Mild asymmetric atrophy of the left psoas and iliacus musculature, and chronic deformity about the pelvis and proximal left femur. Electronically Signed   By: Garald Balding M.D.   On: 01/31/2016 03:39   Dg Chest 2 View  Result Date: 01/31/2016 CLINICAL DATA:  Left-sided chest and arm pain tonight. Nausea.  History of hypertension. EXAM: CHEST  2 VIEW COMPARISON:  11/22/2015 FINDINGS: Shallow inspiration. Linear atelectasis or fibrosis in both lung bases. Mild cardiac enlargement without vascular congestion. No edema or consolidation in the lungs. Small amount of fluid in the fissures. No focal consolidation or airspace disease. No pneumothorax. Mediastinal contours appear intact. IMPRESSION: Linear atelectasis or fibrosis in the lung bases. Cardiac enlargement with mild fluid in the fissures. No vascular congestion or edema. Electronically Signed   By: Lucienne Capers M.D.   On: 01/31/2016 00:08     LOS: 3 days   Oren Binet, MD  [facility] Pager:[phone]  If 7PM-7AM, please contact night-coverage [email or web address] Password [email or web address] 02/03/2016, 2:04 PM

## 2016-02-04 DIAGNOSIS — F1910 Other psychoactive substance abuse, uncomplicated: Secondary | ICD-10-CM

## 2016-02-04 MED ORDER — ISOSORBIDE MONONITRATE ER 30 MG PO TB24
30.0000 mg | ORAL_TABLET | Freq: Every day | ORAL | 0 refills | Status: DC
Start: 2016-02-04 — End: 2016-08-17

## 2016-02-04 MED ORDER — FUROSEMIDE 20 MG PO TABS: 20.0000 mg | ORAL_TABLET | Freq: Every day | ORAL | 0 refills | Status: DC

## 2016-02-04 MED ORDER — SPIRONOLACTONE 25 MG PO TABS: 25.0000 mg | ORAL_TABLET | Freq: Every day | ORAL | 0 refills | Status: DC

## 2016-02-04 MED ORDER — ASPIRIN 81 MG PO TBEC: 81.0000 mg | DELAYED_RELEASE_TABLET | Freq: Every day | ORAL | 0 refills | Status: DC

## 2016-02-04 MED ORDER — HYDRALAZINE HCL 50 MG PO TABS: 50.0000 mg | ORAL_TABLET | Freq: Three times a day (TID) | ORAL | 0 refills | Status: DC

## 2016-02-04 MED ORDER — OXYCODONE HCL 5 MG PO TABS: 5.0000 mg | ORAL_TABLET | Freq: Four times a day (QID) | ORAL | 0 refills | Status: DC | PRN

## 2016-02-04 NOTE — Progress Notes (Signed)
NURSING PROGRESS NOTE  Ryan [name] [id number] Discharge Data: 02/04/2016 10:47 AM Attending Provider: Jonetta Osgood, MD [id number], MD (Inactive)   [name] to be D/C'd Home per MD order.    All IV's will be discontinued and monitored for bleeding.  All belongings will be returned to patient for patient to take home.  Last Documented Vital Signs:  Blood pressure (!) 163/99, pulse (!) 103, temperature 98 F (36.7 C), temperature source Oral, resp. rate 18, height 5\' 11"  (1.803 m), weight 91.4 kg (201 lb 8 oz), SpO2 97 %.  Joslyn Hy, MSN, RN, [employer]

## 2016-02-04 NOTE — Discharge Summary (Addendum)
PATIENT DETAILS Name: Ryan [name] Age: 65 y.o. Sex: male Date of Birth: 1950-06-10 MRN: [id number]. Admitting Physician: Rise Patience, MD [id number], MD (Inactive)  Admit Date: 01/31/2016 Discharge date: 02/04/2016  Recommendations for Outpatient Follow-up:  1. Follow up with PCP in 1-2 weeks 2. Please obtain BMP/CBC in one week 3. Please ensure follow-up at the CHF clinic 4. Will need ongoing counseling regarding importance of stopping alcohol use.  Admitted From:  Home  Disposition: [place]: No  Equipment/Devices: None  Discharge Condition: Stable  CODE STATUS: FULL CODE  Diet recommendation:  Heart Healthy  Brief Summary: See H&P, Labs, Consult and Test reports for all details in brief, Patient is a 65 y.o. male with known history of alcohol use-presented with abdominal pain, found to have pancreatitis. Admitted for further evaluation and treatment-see below for further details.  Brief Hospital Course: Acute pancreatitis: Suspected alcoholic etiology-does acknowledge binging alcohol approximately a week prior to this admission. he was managed with supportive measures, initially kept nothing by mouth and diet slowly advanced. By day of discharge hardly any abdominal pain, abdominal is soft and nontender. His diet has been advanced to a soft diet- which he has tolerated. He is being discharged home in a stable manner. He has been counseled extensively regarding importance of completely stopping alcohol use.   Chronic systolic heart failure (EF by TTE on 11/24/15 around 20-25%): Compensated, continue his usual diuretic regimen and antihypertensives.  Per cardiology note (discharge summary 11/27/15)-not felt to be a candidate for ICD given unknown prognoses, continued alcohol abuse and noncompliance. Have asked him to call CHF clinic early next week when it opens (currently close for Thanksgiving) to reschedule a follow-up  appointment.  Hypertension: Well-controlled-resume his usual antihypertensives on discharge.  History of alcohol abuse: Counseled extensively, last drink was approximately 2 days prior to admission-no signs of alcohol withdrawal currently. As noted above, approximately 1 week prior to this admission-patient acknowledges binging alcohol.  Procedures/Studies: None  Discharge Diagnoses:  Principal Problem:   Acute pancreatitis Active Problems:   Chronic systolic heart failure (HCC)   CKD (chronic kidney disease), stage III   HTN (hypertension)   COPD (chronic obstructive pulmonary disease) (HCC)   Polysubstance abuse   Discharge Instructions:  Activity:  As tolerated with Full fall precautions use walker/cane & assistance as needed   Discharge Instructions    Call MD for:  persistant nausea and vomiting    Complete by:  As directed    Call MD for:  severe uncontrolled pain    Complete by:  As directed    Diet - low sodium heart healthy    Complete by:  As directed    Discharge instructions    Complete by:  As directed    Follow with Primary MD  [name], MD and your primary cardiologist in the next 1-2 weeks  Stop drinking alcohol!!  Please get a complete blood count and chemistry panel checked by your Primary MD at your next visit, and again as instructed by your Primary MD.  Get Medicines reviewed and adjusted: Please take all your medications with you for your next visit with your Primary MD  Laboratory/radiological data: Please request your Primary MD to go over all hospital tests and procedure/radiological results at the follow up, please ask your Primary MD to get all Hospital records sent to his/her office.  In some cases, they will be blood work, cultures and biopsy results pending at the time of your discharge. Please  request that your primary care M.D. follows up on these results.  Also Note the following: If you experience worsening of your admission  symptoms, develop shortness of breath, life threatening emergency, suicidal or homicidal thoughts you must seek medical attention immediately by calling 911 or calling your MD immediately  if symptoms less severe.  You must read complete instructions/literature along with all the possible adverse reactions/side effects for all the Medicines you take and that have been prescribed to you. Take any new Medicines after you have completely understood and accpet all the possible adverse reactions/side effects.   Do not drive when taking Pain medications or sleeping medications (Benzodaizepines)  Do not take more than prescribed Pain, Sleep and Anxiety Medications. It is not advisable to combine anxiety,sleep and pain medications without talking with your primary care practitioner  Special Instructions: If you have smoked or chewed Tobacco  in the last 2 yrs please stop smoking, stop any regular Alcohol  and or any Recreational drug use.  Wear Seat belts while driving.  Please note: You were cared for by a hospitalist during your hospital stay. Once you are discharged, your primary care physician will handle any further medical issues. Please note that NO REFILLS for any discharge medications will be authorized once you are discharged, as it is imperative that you return to your primary care physician (or establish a relationship with a primary care physician if you do not have one) for your post hospital discharge needs so that they can reassess your need for medications and monitor your lab values.   Increase activity slowly    Complete by:  As directed        Medication List    STOP taking these medications   digoxin 0.125 MG tablet Commonly known as:  LANOXIN     TAKE these medications   aspirin 81 MG EC tablet Take 1 tablet (81 mg total) by mouth daily.   furosemide 20 MG tablet Commonly known as:  LASIX Take 1 tablet (20 mg total) by mouth daily. Take an extra 20 mg (1 tablet) for  weight gain > 3 lbs in 1 day or > 5 lbs in 1 week   hydrALAZINE 50 MG tablet Commonly known as:  APRESOLINE Take 1 tablet (50 mg total) by mouth 3 (three) times daily.   isosorbide mononitrate 30 MG 24 hr tablet Commonly known as:  IMDUR Take 1 tablet (30 mg total) by mouth daily.   losartan 25 MG tablet Commonly known as:  COZAAR Take 1 tablet (25 mg total) by mouth daily.   oxyCODONE 5 MG immediate release tablet Commonly known as:  Oxy IR/ROXICODONE Take 1 tablet (5 mg total) by mouth every 6 (six) hours as needed for moderate pain.   spironolactone 25 MG tablet Commonly known as:  ALDACTONE Take 1 tablet (25 mg total) by mouth daily.      Follow-up Information    ROSENTHAL,AMY, MD. Schedule an appointment as soon as possible for a visit in 1 week(s).   Specialty:  Internal Medicine         No Known Allergies  Consultations:   None  Other Procedures/Studies: Ct Abdomen Pelvis Wo Contrast  Result Date: 01/31/2016 CLINICAL DATA:  Acute onset of left upper quadrant abdominal pain. Initial encounter. EXAM: CT ABDOMEN AND PELVIS WITHOUT CONTRAST TECHNIQUE: Multidetector CT imaging of the abdomen and pelvis was performed following the standard protocol without IV contrast. COMPARISON:  Abdominal radiograph performed 04/23/2013 FINDINGS: Lower chest: Mild bibasilar  atelectasis is noted. The visualized portions of the mediastinum are unremarkable. Hepatobiliary: The liver is unremarkable in appearance. The gallbladder is unremarkable in appearance. The common bile duct remains normal in caliber. Pancreas: Vague soft tissue inflammation is noted about the distal body and tail of the pancreas, with trace fluid tracking inferiorly along Gerota's fascia on the left. This is concerning for acute pancreatitis. There is no evidence of pseudocyst formation. Evaluation for devascularization is limited without contrast. Spleen: The spleen is unremarkable in appearance. Adrenals/Urinary  Tract: The adrenal glands are unremarkable in appearance. The kidneys are within normal limits. There is no evidence of hydronephrosis. No renal or ureteral stones are identified. No perinephric stranding is seen. Multiple nonobstructing right renal stones are seen, measuring up to 1.0 cm in size. Small bilateral renal cysts are seen. Nonspecific perinephric stranding is noted bilaterally. There is no evidence of hydronephrosis. No obstructing ureteral stones are seen. Stomach/Bowel: The stomach is unremarkable in appearance. The small bowel is within normal limits. The appendix is normal in caliber, without evidence of appendicitis. Scattered diverticulosis is noted along the descending colon, without evidence of diverticulitis. Vascular/Lymphatic: Scattered calcification is seen along the abdominal aorta and its branches. The abdominal aorta is otherwise grossly unremarkable. The inferior vena cava is grossly unremarkable. No retroperitoneal lymphadenopathy is seen. No pelvic sidewall lymphadenopathy is identified. Reproductive: The bladder is mildly distended and grossly unremarkable. The prostate remains normal in size. Scattered calcification is noted within the prostate. Other: There is mild asymmetric atrophy of the left psoas and iliacus muscles, and extensive chronic deformity at the proximal left femur, with surrounding heterotopic bone formation. Musculoskeletal: No acute osseous abnormalities are seen. Chronic deformities are noted at the superior and inferior pubic rami bilaterally. IMPRESSION: 1. Vague soft tissue inflammation about the distal body and tail of the pancreas, with trace fluid tracking inferiorly along Gerota's fascia on the left. This is concerning for acute pancreatitis. No evidence for pseudocyst formation. 2. Scattered aortic atherosclerosis. 3. Scattered diverticulosis along the descending colon, without evidence of diverticulitis. 4. Nonobstructing right renal stones measure up to  1.0 cm in size. Small bilateral renal cysts seen. 5. Mild bibasilar atelectasis noted. 6. Mild asymmetric atrophy of the left psoas and iliacus musculature, and chronic deformity about the pelvis and proximal left femur. Electronically Signed   By: Garald Balding M.D.   On: 01/31/2016 03:39   Dg Chest 2 View  Result Date: 01/31/2016 CLINICAL DATA:  Left-sided chest and arm pain tonight. Nausea. History of hypertension. EXAM: CHEST  2 VIEW COMPARISON:  11/22/2015 FINDINGS: Shallow inspiration. Linear atelectasis or fibrosis in both lung bases. Mild cardiac enlargement without vascular congestion. No edema or consolidation in the lungs. Small amount of fluid in the fissures. No focal consolidation or airspace disease. No pneumothorax. Mediastinal contours appear intact. IMPRESSION: Linear atelectasis or fibrosis in the lung bases. Cardiac enlargement with mild fluid in the fissures. No vascular congestion or edema. Electronically Signed   By: Lucienne Capers M.D.   On: 01/31/2016 00:08      TODAY-DAY OF DISCHARGE:  Subjective:   Ryan [name] today has no headache,no chest abdominal pain,no new weakness tingling or numbness, feels much better wants to go home today.   Objective:   Blood pressure (!) 163/99, pulse (!) 103, temperature 98 F (36.7 C), temperature source Oral, resp. rate 18, height 5\' 11"  (1.803 m), weight 91.4 kg (201 lb 8 oz), SpO2 97 %.  Intake/Output Summary (Last 24 hours) at 02/04/16 0945  Last data filed at 02/03/16 2240  Gross per 24 hour  Intake                0 ml  Output              500 ml  Net             -500 ml   Filed Weights   01/31/16 1113 02/01/16 0525 02/02/16 0541  Weight: 91.4 kg (201 lb 9.6 oz) 91.9 kg (202 lb 9.6 oz) 91.4 kg (201 lb 8 oz)    Exam: Awake Alert, Oriented *3, No new F.N deficits, Normal affect [place].AT,PERRAL Supple Neck,No JVD, No cervical lymphadenopathy appriciated.  Symmetrical Chest wall movement, Good air movement bilaterally,  CTAB RRR,No Gallops,Rubs or new Murmurs, No Parasternal Heave +ve B.Sounds, Abd Soft, Non tender, No organomegaly appriciated, No rebound -guarding or rigidity. No Cyanosis, Clubbing or edema, No new Rash or bruise   PERTINENT RADIOLOGIC STUDIES: Ct Abdomen Pelvis Wo Contrast  Result Date: 01/31/2016 CLINICAL DATA:  Acute onset of left upper quadrant abdominal pain. Initial encounter. EXAM: CT ABDOMEN AND PELVIS WITHOUT CONTRAST TECHNIQUE: Multidetector CT imaging of the abdomen and pelvis was performed following the standard protocol without IV contrast. COMPARISON:  Abdominal radiograph performed 04/23/2013 FINDINGS: Lower chest: Mild bibasilar atelectasis is noted. The visualized portions of the mediastinum are unremarkable. Hepatobiliary: The liver is unremarkable in appearance. The gallbladder is unremarkable in appearance. The common bile duct remains normal in caliber. Pancreas: Vague soft tissue inflammation is noted about the distal body and tail of the pancreas, with trace fluid tracking inferiorly along Gerota's fascia on the left. This is concerning for acute pancreatitis. There is no evidence of pseudocyst formation. Evaluation for devascularization is limited without contrast. Spleen: The spleen is unremarkable in appearance. Adrenals/Urinary Tract: The adrenal glands are unremarkable in appearance. The kidneys are within normal limits. There is no evidence of hydronephrosis. No renal or ureteral stones are identified. No perinephric stranding is seen. Multiple nonobstructing right renal stones are seen, measuring up to 1.0 cm in size. Small bilateral renal cysts are seen. Nonspecific perinephric stranding is noted bilaterally. There is no evidence of hydronephrosis. No obstructing ureteral stones are seen. Stomach/Bowel: The stomach is unremarkable in appearance. The small bowel is within normal limits. The appendix is normal in caliber, without evidence of appendicitis. Scattered  diverticulosis is noted along the descending colon, without evidence of diverticulitis. Vascular/Lymphatic: Scattered calcification is seen along the abdominal aorta and its branches. The abdominal aorta is otherwise grossly unremarkable. The inferior vena cava is grossly unremarkable. No retroperitoneal lymphadenopathy is seen. No pelvic sidewall lymphadenopathy is identified. Reproductive: The bladder is mildly distended and grossly unremarkable. The prostate remains normal in size. Scattered calcification is noted within the prostate. Other: There is mild asymmetric atrophy of the left psoas and iliacus muscles, and extensive chronic deformity at the proximal left femur, with surrounding heterotopic bone formation. Musculoskeletal: No acute osseous abnormalities are seen. Chronic deformities are noted at the superior and inferior pubic rami bilaterally. IMPRESSION: 1. Vague soft tissue inflammation about the distal body and tail of the pancreas, with trace fluid tracking inferiorly along Gerota's fascia on the left. This is concerning for acute pancreatitis. No evidence for pseudocyst formation. 2. Scattered aortic atherosclerosis. 3. Scattered diverticulosis along the descending colon, without evidence of diverticulitis. 4. Nonobstructing right renal stones measure up to 1.0 cm in size. Small bilateral renal cysts seen. 5. Mild bibasilar atelectasis noted. 6. Mild asymmetric atrophy  of the left psoas and iliacus musculature, and chronic deformity about the pelvis and proximal left femur. Electronically Signed   By: Garald Balding M.D.   On: 01/31/2016 03:39   Dg Chest 2 View  Result Date: 01/31/2016 CLINICAL DATA:  Left-sided chest and arm pain tonight. Nausea. History of hypertension. EXAM: CHEST  2 VIEW COMPARISON:  11/22/2015 FINDINGS: Shallow inspiration. Linear atelectasis or fibrosis in both lung bases. Mild cardiac enlargement without vascular congestion. No edema or consolidation in the lungs. Small  amount of fluid in the fissures. No focal consolidation or airspace disease. No pneumothorax. Mediastinal contours appear intact. IMPRESSION: Linear atelectasis or fibrosis in the lung bases. Cardiac enlargement with mild fluid in the fissures. No vascular congestion or edema. Electronically Signed   By: Lucienne Capers M.D.   On: 01/31/2016 00:08     PERTINENT LAB RESULTS: CBC:  Recent Labs  02/02/16 0807  WBC 9.8  HGB 14.2  HCT 41.8  PLT 186   CMET CMP     Component Value Date/Time   NA 136 02/02/2016 0807   K 3.7 02/02/2016 0807   CL 101 02/02/2016 0807   CO2 25 02/02/2016 0807   GLUCOSE 95 02/02/2016 0807   BUN 5 (L) 02/02/2016 0807   CREATININE 0.99 02/02/2016 0807   CREATININE 1.24 07/08/2010 1534   CALCIUM 9.2 02/02/2016 0807   PROT 6.2 (L) 02/01/2016 0516   ALBUMIN 3.2 (L) 02/01/2016 0516   AST 22 02/01/2016 0516   ALT 23 02/01/2016 0516   ALKPHOS 67 02/01/2016 0516   BILITOT 0.8 02/01/2016 0516   GFRNONAA >60 02/02/2016 0807   GFRAA >60 02/02/2016 0807    GFR Estimated Creatinine Clearance: 84.8 mL/min (by C-G formula based on SCr of 0.99 mg/dL).  Recent Labs  02/02/16 0807  LIPASE 48   No results for input(s): CKTOTAL, CKMB, CKMBINDEX, TROPONINI in the last 72 hours. Invalid input(s): POCBNP No results for input(s): DDIMER in the last 72 hours. No results for input(s): HGBA1C in the last 72 hours. No results for input(s): CHOL, HDL, LDLCALC, TRIG, CHOLHDL, LDLDIRECT in the last 72 hours. No results for input(s): TSH, T4TOTAL, T3FREE, THYROIDAB in the last 72 hours.  Invalid input(s): FREET3 No results for input(s): VITAMINB12, FOLATE, FERRITIN, TIBC, IRON, RETICCTPCT in the last 72 hours. Coags: No results for input(s): INR in the last 72 hours.  Invalid input(s): PT Microbiology: No results found for this or any previous visit (from the past 240 hour(s)).  FURTHER DISCHARGE INSTRUCTIONS:  Get Medicines reviewed and adjusted: Please take all  your medications with you for your next visit with your Primary MD  Laboratory/radiological data: Please request your Primary MD to go over all hospital tests and procedure/radiological results at the follow up, please ask your Primary MD to get all Hospital records sent to his/her office.  In some cases, they will be blood work, cultures and biopsy results pending at the time of your discharge. Please request that your primary care M.D. goes through all the records of your hospital data and follows up on these results.  Also Note the following: If you experience worsening of your admission symptoms, develop shortness of breath, life threatening emergency, suicidal or homicidal thoughts you must seek medical attention immediately by calling 911 or calling your MD immediately  if symptoms less severe.  You must read complete instructions/literature along with all the possible adverse reactions/side effects for all the Medicines you take and that have been prescribed to you. Take  any new Medicines after you have completely understood and accpet all the possible adverse reactions/side effects.   Do not drive when taking Pain medications or sleeping medications (Benzodaizepines)  Do not take more than prescribed Pain, Sleep and Anxiety Medications. It is not advisable to combine anxiety,sleep and pain medications without talking with your primary care practitioner  Special Instructions: If you have smoked or chewed Tobacco  in the last 2 yrs please stop smoking, stop any regular Alcohol  and or any Recreational drug use.  Wear Seat belts while driving.  Please note: You were cared for by a hospitalist during your hospital stay. Once you are discharged, your primary care physician will handle any further medical issues. Please note that NO REFILLS for any discharge medications will be authorized once you are discharged, as it is imperative that you return to your primary care physician (or establish  a relationship with a primary care physician if you do not have one) for your post hospital discharge needs so that they can reassess your need for medications and monitor your lab values.  Total Time spent coordinating discharge including counseling, education and face to face time equals 45 minutes.  SignedOren Binet 02/04/2016 9:45 AM

## 2016-02-04 NOTE — Care Management Note (Signed)
Case Management Note  Patient Details  Name: Ryan [name] MRN: [id number] Date of Birth: 12-14-1950  Subjective/Objective:                 Patient admitted for pancreatitis. Independent PTA. From home with wife.   Action/Plan:  No CM needs identified at this time. DC to home self care.   Expected Discharge Date:                  Expected Discharge Plan:  Home/Self Care  In-House Referral:     Discharge planning Services  CM Consult  Post Acute Care Choice:    Choice offered to:     DME Arranged:    DME Agency:     HH Arranged:    [place] Agency:     Status of Service:  Completed, signed off  If discussed at [employer] of Stay Meetings, dates discussed:    Additional Comments:  Carles Collet, RN 02/04/2016, 10:42 AM

## 2016-02-08 MED FILL — SPIRONOLACTONE 25 MG TABLET: 25 | 30 days supply | Qty: 30 | Fill #0

## 2016-02-08 MED FILL — ISOSORBIDE MN ER 30 MG TAB: 30 | 30 days supply | Qty: 30 | Fill #0

## 2016-02-08 MED FILL — FUROSEMIDE 20 MG TABLET: 20 | 15 days supply | Qty: 30 | Fill #0

## 2016-02-08 MED FILL — oxyCODONE HCL 5 MG TABS: 5 | 4 days supply | Qty: 15 | Fill #0

## 2016-02-27 ENCOUNTER — Encounter (HOSPITAL_COMMUNITY): Payer: Self-pay | Admitting: Emergency Medicine

## 2016-02-27 ENCOUNTER — Emergency Department (HOSPITAL_COMMUNITY): Payer: Medicare Other

## 2016-02-27 ENCOUNTER — Emergency Department (HOSPITAL_COMMUNITY)
Admission: EM | Admit: 2016-02-27 | Discharge: 2016-02-27 | Disposition: A | Discharge status: Home / Self Care | Payer: Medicare Other | Attending: Emergency Medicine | Admitting: Emergency Medicine

## 2016-02-27 DIAGNOSIS — F17210 Nicotine dependence, cigarettes, uncomplicated: Secondary | ICD-10-CM | POA: Insufficient documentation

## 2016-02-27 DIAGNOSIS — R05 Cough: Secondary | ICD-10-CM | POA: Diagnosis not present

## 2016-02-27 DIAGNOSIS — R0602 Shortness of breath: Secondary | ICD-10-CM | POA: Diagnosis present

## 2016-02-27 DIAGNOSIS — Z7982 Long term (current) use of aspirin: Secondary | ICD-10-CM | POA: Insufficient documentation

## 2016-02-27 DIAGNOSIS — J449 Chronic obstructive pulmonary disease, unspecified: Secondary | ICD-10-CM | POA: Diagnosis not present

## 2016-02-27 DIAGNOSIS — R1032 Left lower quadrant pain: Secondary | ICD-10-CM

## 2016-02-27 DIAGNOSIS — F1010 Alcohol abuse, uncomplicated: Secondary | ICD-10-CM

## 2016-02-27 DIAGNOSIS — F10930 Alcohol use, unspecified with withdrawal, uncomplicated: Secondary | ICD-10-CM

## 2016-02-27 DIAGNOSIS — R000 Tachycardia, unspecified: Secondary | ICD-10-CM

## 2016-02-27 DIAGNOSIS — F10230 Alcohol dependence with withdrawal, uncomplicated: Secondary | ICD-10-CM

## 2016-02-27 DIAGNOSIS — I5023 Acute on chronic systolic (congestive) heart failure: Secondary | ICD-10-CM | POA: Diagnosis not present

## 2016-02-27 DIAGNOSIS — I130 Hypertensive heart and chronic kidney disease with heart failure and stage 1 through stage 4 chronic kidney disease, or unspecified chronic kidney disease: Secondary | ICD-10-CM | POA: Diagnosis not present

## 2016-02-27 DIAGNOSIS — R079 Chest pain, unspecified: Secondary | ICD-10-CM | POA: Diagnosis not present

## 2016-02-27 DIAGNOSIS — N183 Chronic kidney disease, stage 3 (moderate): Secondary | ICD-10-CM | POA: Insufficient documentation

## 2016-02-27 DIAGNOSIS — I428 Other cardiomyopathies: Secondary | ICD-10-CM

## 2016-02-27 LAB — HEPATIC FUNCTION PANEL
ALK PHOS: 69 U/L (ref 38–126)
ALT: 56 U/L (ref 17–63)
AST: 42 U/L — ABNORMAL HIGH (ref 15–41)
Albumin: 3.9 g/dL (ref 3.5–5.0)
BILIRUBIN INDIRECT: 0.6 mg/dL (ref 0.3–0.9)
BILIRUBIN TOTAL: 0.8 mg/dL (ref 0.3–1.2)
Bilirubin, Direct: 0.2 mg/dL (ref 0.1–0.5)
TOTAL PROTEIN: 7.2 g/dL (ref 6.5–8.1)

## 2016-02-27 LAB — BASIC METABOLIC PANEL
Anion gap: 14 (ref 5–15)
BUN: 17 mg/dL (ref 6–20)
CHLORIDE: 108 mmol/L (ref 101–111)
CO2: 16 mmol/L — AB (ref 22–32)
CREATININE: 1.15 mg/dL (ref 0.61–1.24)
Calcium: 9.8 mg/dL (ref 8.9–10.3)
GFR calc Af Amer: >60 mL/min (ref >60)
GFR calc non Af Amer: >60 mL/min (ref >60)
GLUCOSE: 91 mg/dL (ref 65–99)
POTASSIUM: 4.3 mmol/L (ref 3.5–5.1)
SODIUM: 138 mmol/L (ref 135–145)

## 2016-02-27 LAB — CBC
HCT: 44.3 % (ref 39.0–52.0)
Hemoglobin: 15.1 g/dL (ref 13.0–17.0)
MCH: 31.2 pg (ref 26.0–34.0)
MCHC: 34.1 g/dL (ref 30.0–36.0)
MCV: 91.5 fL (ref 78.0–100.0)
PLATELETS: 203 10*3/uL (ref 150–400)
RBC: 4.84 MIL/uL (ref 4.22–5.81)
RDW: 15.5 % (ref 11.5–15.5)
WBC: 7.1 10*3/uL (ref 4.0–10.5)

## 2016-02-27 LAB — I-STAT TROPONIN, ED: TROPONIN I, POC: 0.01 ng/mL (ref 0.00–0.08)

## 2016-02-27 LAB — LIPASE, BLOOD: LIPASE: 24 U/L (ref 11–51)

## 2016-02-27 LAB — BRAIN NATRIURETIC PEPTIDE: B Natriuretic Peptide: 722.3 pg/mL — ABNORMAL HIGH (ref 0.0–100.0)

## 2016-02-27 LAB — ETHANOL: Alcohol, Ethyl (B): 83 mg/dL — ABNORMAL HIGH (ref <5)

## 2016-02-27 MED ORDER — CARVEDILOL 3.125 MG PO TABS: 3.1250 mg | ORAL_TABLET | Freq: Two times a day (BID) | ORAL | 1 refills | Status: DC

## 2016-02-27 MED ORDER — ONDANSETRON 4 MG PO TBDP
4.0000 mg | ORAL_TABLET | Freq: Once | ORAL | Status: AC
  Administered 2016-02-27: 4 mg via ORAL
  Filled 2016-02-27: qty 1

## 2016-02-27 MED ORDER — LORAZEPAM 1 MG PO TABS: 1.0000 mg | ORAL_TABLET | Freq: Three times a day (TID) | ORAL | 0 refills | Status: DC | PRN

## 2016-02-27 MED ORDER — LORAZEPAM 2 MG/ML IJ SOLN
1.0000 mg | Freq: Once | INTRAMUSCULAR | Status: AC
  Administered 2016-02-27: 1 mg via INTRAVENOUS
  Filled 2016-02-27: qty 1

## 2016-02-27 MED ORDER — FUROSEMIDE 10 MG/ML IJ SOLN
80.0000 mg | Freq: Once | INTRAMUSCULAR | Status: AC
  Administered 2016-02-27: 80 mg via INTRAVENOUS
  Filled 2016-02-27: qty 8

## 2016-02-27 NOTE — Discharge Instructions (Signed)
Start taking your new medicine as per request of cardiology. Make an appointment to follow-up with them. Take her Ativan as needed for any alcohol withdrawal symptoms. Return for any new or worse symptoms.

## 2016-02-27 NOTE — ED Triage Notes (Signed)
Pt. Stated, I started having SOB and some chest pain about a couple of days ago. I also have a little pain on my right side, but I know its not my pancreas.
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
## 2016-02-27 NOTE — ED Notes (Signed)
This RN was called into room, pt diaphoretic, RR rapid, pt reporting CP 8/10.  New EKG performed and given to D.r [facility].

## 2016-02-27 NOTE — ED Notes (Signed)
Pt departed in NAD.  

## 2016-02-27 NOTE — ED Notes (Signed)
After administering zofran dose, pt asked if he "could have some ice cream." Informed pt that, having continued nausea to this point, it would be unwise to consume anything dairy or greasy that would most certainly upset his stomach again. Told pt that I would get him ginger ale & crackers after nausea had subsided, but would need to refrain from eating or drinking at this time.
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
## 2016-02-27 NOTE — ED Provider Notes (Addendum)
Patient turned over to me by Dr. Tamera Punt. Patient was to be evaluated by cardiology he was evaluated cardiology felt he was stable for discharge home. Recommended restarting him on a low dose of Coreg. Prescription was printed. Patient then suddenly became diaphoretic started feeling very bad. Oxygen saturation saturations were still 99%. Repeat EKG without any changes. Patient had cardiac cath in September without any significant coronary artery disease. Repeat chest x-ray showed no acute changes or any flash pulmonary edema. Patient remained somewhat diaphoretic and feeling bad. Suspect this may be alcohol withdrawal. Patient treated with some Ativan. Will also be given some Zofran will continue to watch. If unable to get him feeling better patient may require medicine admission. Patient seems to be stable from the congestive heart failure standpoint.   Fredia Sorrow, MD 02/27/16 1840   The patient responded very well to the Ativan. Symptoms resolved. Suspect the symptoms were related to some mild alcohol withdrawal. Patient's repeat chest x-ray showed no evidence of any pulmonary edema or changes. Heart rate still around 100 but lower heart rate than he had previously.   Fredia Sorrow, MD 02/27/16 2045

## 2016-02-28 MED FILL — CARVEDILOL 3.125 MG TABLET: 3.125 | 15 days supply | Qty: 30 | Fill #0

## 2016-02-28 MED FILL — LORazepam 1 MG TABS: 1 | 5 days supply | Qty: 15 | Fill #0

## 2016-03-23 DIAGNOSIS — F17210 Nicotine dependence, cigarettes, uncomplicated: Secondary | ICD-10-CM | POA: Diagnosis not present

## 2016-03-23 DIAGNOSIS — M21952 Unspecified acquired deformity of left thigh: Secondary | ICD-10-CM | POA: Diagnosis not present

## 2016-03-23 DIAGNOSIS — G8929 Other chronic pain: Secondary | ICD-10-CM | POA: Diagnosis not present

## 2016-03-23 DIAGNOSIS — I10 Essential (primary) hypertension: Secondary | ICD-10-CM | POA: Diagnosis not present

## 2016-03-23 DIAGNOSIS — M533 Sacrococcygeal disorders, not elsewhere classified: Secondary | ICD-10-CM | POA: Diagnosis not present

## 2016-03-23 DIAGNOSIS — M160 Bilateral primary osteoarthritis of hip: Secondary | ICD-10-CM | POA: Diagnosis not present

## 2016-03-23 DIAGNOSIS — M25552 Pain in left hip: Secondary | ICD-10-CM | POA: Diagnosis not present

## 2016-03-23 DIAGNOSIS — E785 Hyperlipidemia, unspecified: Secondary | ICD-10-CM | POA: Diagnosis not present

## 2016-03-23 DIAGNOSIS — M4307 Spondylolysis, lumbosacral region: Secondary | ICD-10-CM | POA: Diagnosis not present

## 2016-05-08 MED FILL — hydrALAZINE HCL 25 MG TABS: 25 | 34 days supply | Qty: 102 | Fill #0

## 2016-05-08 MED FILL — ISOSORBIDE MN ER 30 MG TAB: 30 | 34 days supply | Qty: 34 | Fill #1

## 2016-05-08 MED FILL — CARVEDILOL 3.125 MG TABLET: 3.125 | 15 days supply | Qty: 30 | Fill #1

## 2016-05-08 MED FILL — LOSARTAN POTASSIUM 25 MG TA: 25 | 34 days supply | Qty: 34 | Fill #0

## 2016-05-08 MED FILL — ASPIR-LOW EC 81 MG TABLET: 81 | 30 days supply | Qty: 30 | Fill #0

## 2016-05-08 MED FILL — SPIRONOLACTONE 25 MG TABLET: 25 | 34 days supply | Qty: 34 | Fill #1

## 2016-07-13 ENCOUNTER — Emergency Department (HOSPITAL_COMMUNITY): Payer: Medicare Other

## 2016-07-13 ENCOUNTER — Inpatient Hospital Stay (HOSPITAL_COMMUNITY)
Admission: EM | Admit: 2016-07-13 | Discharge: 2016-07-17 | DRG: 291 | Disposition: A | Discharge status: Home / Self Care | Payer: Medicare Other | Attending: Student in an Organized Health Care Education/Training Program | Admitting: Student in an Organized Health Care Education/Training Program

## 2016-07-13 ENCOUNTER — Encounter (HOSPITAL_COMMUNITY): Payer: Self-pay | Admitting: Emergency Medicine

## 2016-07-13 DIAGNOSIS — R0602 Shortness of breath: Secondary | ICD-10-CM | POA: Diagnosis not present

## 2016-07-13 DIAGNOSIS — R079 Chest pain, unspecified: Secondary | ICD-10-CM | POA: Diagnosis not present

## 2016-07-13 DIAGNOSIS — R05 Cough: Secondary | ICD-10-CM | POA: Diagnosis not present

## 2016-07-13 DIAGNOSIS — I130 Hypertensive heart and chronic kidney disease with heart failure and stage 1 through stage 4 chronic kidney disease, or unspecified chronic kidney disease: Principal | ICD-10-CM | POA: Diagnosis present

## 2016-07-13 DIAGNOSIS — I5042 Chronic combined systolic (congestive) and diastolic (congestive) heart failure: Secondary | ICD-10-CM

## 2016-07-13 DIAGNOSIS — R600 Localized edema: Secondary | ICD-10-CM | POA: Diagnosis not present

## 2016-07-13 DIAGNOSIS — F1010 Alcohol abuse, uncomplicated: Secondary | ICD-10-CM | POA: Diagnosis present

## 2016-07-13 DIAGNOSIS — Z79899 Other long term (current) drug therapy: Secondary | ICD-10-CM

## 2016-07-13 DIAGNOSIS — I5043 Acute on chronic combined systolic (congestive) and diastolic (congestive) heart failure: Secondary | ICD-10-CM | POA: Diagnosis present

## 2016-07-13 DIAGNOSIS — T502X5A Adverse effect of carbonic-anhydrase inhibitors, benzothiadiazides and other diuretics, initial encounter: Secondary | ICD-10-CM | POA: Diagnosis present

## 2016-07-13 DIAGNOSIS — K5900 Constipation, unspecified: Secondary | ICD-10-CM | POA: Diagnosis not present

## 2016-07-13 DIAGNOSIS — Z7982 Long term (current) use of aspirin: Secondary | ICD-10-CM

## 2016-07-13 DIAGNOSIS — R0600 Dyspnea, unspecified: Secondary | ICD-10-CM | POA: Diagnosis not present

## 2016-07-13 DIAGNOSIS — I4581 Long QT syndrome: Secondary | ICD-10-CM | POA: Diagnosis present

## 2016-07-13 DIAGNOSIS — N179 Acute kidney failure, unspecified: Secondary | ICD-10-CM | POA: Diagnosis present

## 2016-07-13 DIAGNOSIS — R0989 Other specified symptoms and signs involving the circulatory and respiratory systems: Secondary | ICD-10-CM | POA: Diagnosis not present

## 2016-07-13 DIAGNOSIS — F329 Major depressive disorder, single episode, unspecified: Secondary | ICD-10-CM | POA: Diagnosis present

## 2016-07-13 DIAGNOSIS — I428 Other cardiomyopathies: Secondary | ICD-10-CM | POA: Diagnosis present

## 2016-07-13 DIAGNOSIS — Z9119 Patient's noncompliance with other medical treatment and regimen: Secondary | ICD-10-CM

## 2016-07-13 DIAGNOSIS — F17210 Nicotine dependence, cigarettes, uncomplicated: Secondary | ICD-10-CM | POA: Diagnosis present

## 2016-07-13 DIAGNOSIS — K219 Gastro-esophageal reflux disease without esophagitis: Secondary | ICD-10-CM | POA: Diagnosis present

## 2016-07-13 DIAGNOSIS — N183 Chronic kidney disease, stage 3 (moderate): Secondary | ICD-10-CM | POA: Diagnosis present

## 2016-07-13 DIAGNOSIS — E876 Hypokalemia: Secondary | ICD-10-CM | POA: Diagnosis not present

## 2016-07-13 DIAGNOSIS — I2510 Atherosclerotic heart disease of native coronary artery without angina pectoris: Secondary | ICD-10-CM | POA: Diagnosis present

## 2016-07-13 LAB — RAPID URINE DRUG SCREEN, HOSP PERFORMED
AMPHETAMINES: NOT DETECTED
Barbiturates: NOT DETECTED
Benzodiazepines: NOT DETECTED
COCAINE: NOT DETECTED
OPIATES: NOT DETECTED
TETRAHYDROCANNABINOL: NOT DETECTED

## 2016-07-13 LAB — CBC
HCT: 44.3 % (ref 39.0–52.0)
Hemoglobin: 14.7 g/dL (ref 13.0–17.0)
MCH: 31.9 pg (ref 26.0–34.0)
MCHC: 33.2 g/dL (ref 30.0–36.0)
MCV: 96.1 fL (ref 78.0–100.0)
Platelets: 219 10*3/uL (ref 150–400)
RBC: 4.61 MIL/uL (ref 4.22–5.81)
RDW: 16.5 % — ABNORMAL HIGH (ref 11.5–15.5)
WBC: 7.5 10*3/uL (ref 4.0–10.5)

## 2016-07-13 LAB — TROPONIN I
TROPONIN I: 0.04 ng/mL — AB (ref <0.03)
Troponin I: 0.04 ng/mL (ref <0.03)

## 2016-07-13 LAB — BRAIN NATRIURETIC PEPTIDE: B Natriuretic Peptide: 1556.9 pg/mL — ABNORMAL HIGH (ref 0.0–100.0)

## 2016-07-13 LAB — BASIC METABOLIC PANEL
Anion gap: 10 (ref 5–15)
BUN: 13 mg/dL (ref 6–20)
CO2: 24 mmol/L (ref 22–32)
Calcium: 9.7 mg/dL (ref 8.9–10.3)
Chloride: 105 mmol/L (ref 101–111)
Creatinine, Ser: 1.33 mg/dL — ABNORMAL HIGH (ref 0.61–1.24)
GFR calc Af Amer: >60 mL/min (ref >60)
GFR calc non Af Amer: 54 mL/min — ABNORMAL LOW (ref >60)
Glucose, Bld: 116 mg/dL — ABNORMAL HIGH (ref 65–99)
Potassium: 3.6 mmol/L (ref 3.5–5.1)
Sodium: 139 mmol/L (ref 135–145)

## 2016-07-13 LAB — I-STAT TROPONIN, ED: Troponin i, poc: 0.02 ng/mL (ref 0.00–0.08)

## 2016-07-13 LAB — ETHANOL: Alcohol, Ethyl (B): <5 mg/dL (ref <5)

## 2016-07-13 LAB — MAGNESIUM: Magnesium: 1.9 mg/dL (ref 1.7–2.4)

## 2016-07-13 LAB — POTASSIUM: Potassium: 3.5 mmol/L (ref 3.5–5.1)

## 2016-07-13 LAB — MRSA PCR SCREENING: MRSA by PCR: NEGATIVE

## 2016-07-13 MED ORDER — FUROSEMIDE 10 MG/ML IJ SOLN
60.0000 mg | Freq: Once | INTRAMUSCULAR | Status: AC
  Administered 2016-07-13: 60 mg via INTRAVENOUS
  Filled 2016-07-13: qty 6

## 2016-07-13 MED ORDER — FUROSEMIDE 20 MG PO TABS
40.0000 mg | ORAL_TABLET | Freq: Every day | ORAL | Status: DC
  Filled 2016-07-13: qty 2

## 2016-07-13 MED ORDER — SODIUM CHLORIDE 0.9% FLUSH
3.0000 mL | Freq: Two times a day (BID) | INTRAVENOUS | Status: DC
  Administered 2016-07-13 – 2016-07-17 (×7): 3 mL via INTRAVENOUS

## 2016-07-13 MED ORDER — AZITHROMYCIN 250 MG PO TABS
500.0000 mg | ORAL_TABLET | Freq: Once | ORAL | Status: DC
  Filled 2016-07-13: qty 2

## 2016-07-13 MED ORDER — HYDRALAZINE HCL 50 MG PO TABS
50.0000 mg | ORAL_TABLET | Freq: Three times a day (TID) | ORAL | Status: DC
  Administered 2016-07-13 – 2016-07-17 (×11): 50 mg via ORAL
  Filled 2016-07-13 (×10): qty 1

## 2016-07-13 MED ORDER — POTASSIUM CHLORIDE 20 MEQ/15ML (10%) PO SOLN
40.0000 meq | Freq: Every day | ORAL | Status: DC
  Administered 2016-07-14 – 2016-07-15 (×3): 40 meq via ORAL
  Filled 2016-07-13 (×3): qty 30

## 2016-07-13 MED ORDER — VITAMIN B-1 100 MG PO TABS
100.0000 mg | ORAL_TABLET | Freq: Every day | ORAL | Status: DC
  Administered 2016-07-13 – 2016-07-17 (×5): 100 mg via ORAL
  Filled 2016-07-13 (×4): qty 1

## 2016-07-13 MED ORDER — ACETAMINOPHEN 500 MG PO TABS
1000.0000 mg | ORAL_TABLET | Freq: Four times a day (QID) | ORAL | Status: DC | PRN
  Administered 2016-07-14 – 2016-07-17 (×2): 1000 mg via ORAL
  Filled 2016-07-13 (×2): qty 2

## 2016-07-13 MED ORDER — ALBUTEROL SULFATE (2.5 MG/3ML) 0.083% IN NEBU
5.0000 mg | INHALATION_SOLUTION | Freq: Once | RESPIRATORY_TRACT | Status: AC
  Administered 2016-07-13: 5 mg via RESPIRATORY_TRACT
  Filled 2016-07-13: qty 6

## 2016-07-13 MED ORDER — ADULT MULTIVITAMIN W/MINERALS CH
1.0000 | ORAL_TABLET | Freq: Every day | ORAL | Status: DC
  Administered 2016-07-13 – 2016-07-17 (×4): 1 via ORAL
  Filled 2016-07-13 (×4): qty 1

## 2016-07-13 MED ORDER — LOSARTAN POTASSIUM 25 MG PO TABS
25.0000 mg | ORAL_TABLET | Freq: Every day | ORAL | Status: DC
  Administered 2016-07-14 – 2016-07-17 (×4): 25 mg via ORAL
  Filled 2016-07-13 (×4): qty 1

## 2016-07-13 MED ORDER — SENNOSIDES-DOCUSATE SODIUM 8.6-50 MG PO TABS: 1.0000 | ORAL_TABLET | Freq: Every evening | ORAL | Status: DC | PRN

## 2016-07-13 MED ORDER — FUROSEMIDE 10 MG/ML IJ SOLN
60.0000 mg | Freq: Two times a day (BID) | INTRAMUSCULAR | Status: AC
  Administered 2016-07-13 – 2016-07-14 (×2): 60 mg via INTRAVENOUS
  Filled 2016-07-13 (×2): qty 6

## 2016-07-13 MED ORDER — NITROGLYCERIN 2 % TD OINT
1.0000 [in_us] | TOPICAL_OINTMENT | Freq: Once | TRANSDERMAL | Status: AC
  Administered 2016-07-13: 1 [in_us] via TOPICAL
  Filled 2016-07-13: qty 1

## 2016-07-13 MED ORDER — FOLIC ACID 1 MG PO TABS
1.0000 mg | ORAL_TABLET | Freq: Every day | ORAL | Status: DC
  Administered 2016-07-13 – 2016-07-17 (×5): 1 mg via ORAL
  Filled 2016-07-13 (×5): qty 1

## 2016-07-13 MED ORDER — SPIRONOLACTONE 25 MG PO TABS
25.0000 mg | ORAL_TABLET | Freq: Every day | ORAL | Status: DC
  Administered 2016-07-14 – 2016-07-17 (×4): 25 mg via ORAL
  Filled 2016-07-13 (×4): qty 1

## 2016-07-13 MED ORDER — ACETAMINOPHEN 650 MG RE SUPP: 650.0000 mg | Freq: Four times a day (QID) | RECTAL | Status: DC | PRN

## 2016-07-13 MED ORDER — ALBUTEROL (5 MG/ML) CONTINUOUS INHALATION SOLN
10.0000 mg/h | INHALATION_SOLUTION | RESPIRATORY_TRACT | Status: DC
  Administered 2016-07-13: 10 mg/h via RESPIRATORY_TRACT
  Filled 2016-07-13: qty 20

## 2016-07-13 MED ORDER — IBUPROFEN 200 MG PO TABS
200.0000 mg | ORAL_TABLET | Freq: Four times a day (QID) | ORAL | Status: DC | PRN
  Administered 2016-07-15: 200 mg via ORAL
  Filled 2016-07-13: qty 1

## 2016-07-13 MED ORDER — ISOSORBIDE MONONITRATE ER 30 MG PO TB24
30.0000 mg | ORAL_TABLET | Freq: Every day | ORAL | Status: DC
  Administered 2016-07-14 – 2016-07-17 (×4): 30 mg via ORAL
  Filled 2016-07-13 (×4): qty 1

## 2016-07-13 MED ORDER — ASPIRIN EC 81 MG PO TBEC
81.0000 mg | DELAYED_RELEASE_TABLET | Freq: Every day | ORAL | Status: DC
  Administered 2016-07-14 – 2016-07-17 (×4): 81 mg via ORAL
  Filled 2016-07-13 (×4): qty 1

## 2016-07-13 MED ORDER — ENOXAPARIN SODIUM 40 MG/0.4ML ~~LOC~~ SOLN
40.0000 mg | SUBCUTANEOUS | Status: DC
  Filled 2016-07-13: qty 0.4

## 2016-07-13 MED ORDER — CARVEDILOL 3.125 MG PO TABS: 3.1250 mg | ORAL_TABLET | Freq: Two times a day (BID) | ORAL | Status: DC

## 2016-07-13 NOTE — Progress Notes (Signed)
Pt taken off bipap.  Pt states he feels he can take a break & he states his breathing is slightly better now, no distress noted currently, VSS.  3W Floor RT given report, bipap sent w/ pt to 3W.

## 2016-07-13 NOTE — ED Triage Notes (Signed)
Pt reports central sharp chest pain and sob x2 days as well as productive cough, hx of chf, reports taking all prescribed meds. resp labored in triage.

## 2016-07-13 NOTE — ED Notes (Signed)
Pt ready for transport.  Transport monitor placed on pt.

## 2016-07-13 NOTE — ED Notes (Signed)
ED Provider at bedside. 

## 2016-07-13 NOTE — ED Notes (Signed)
Dr. Randell Patient admitting at bedside.
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
## 2016-07-14 ENCOUNTER — Other Ambulatory Visit (HOSPITAL_COMMUNITY): Payer: Medicare Other

## 2016-07-14 DIAGNOSIS — N179 Acute kidney failure, unspecified: Secondary | ICD-10-CM | POA: Diagnosis not present

## 2016-07-14 DIAGNOSIS — F329 Major depressive disorder, single episode, unspecified: Secondary | ICD-10-CM | POA: Diagnosis not present

## 2016-07-14 DIAGNOSIS — I110 Hypertensive heart disease with heart failure: Secondary | ICD-10-CM | POA: Diagnosis not present

## 2016-07-14 DIAGNOSIS — I428 Other cardiomyopathies: Secondary | ICD-10-CM | POA: Diagnosis not present

## 2016-07-14 DIAGNOSIS — I5023 Acute on chronic systolic (congestive) heart failure: Secondary | ICD-10-CM | POA: Diagnosis not present

## 2016-07-14 DIAGNOSIS — K5900 Constipation, unspecified: Secondary | ICD-10-CM | POA: Diagnosis not present

## 2016-07-14 DIAGNOSIS — Z9114 Patient's other noncompliance with medication regimen: Secondary | ICD-10-CM | POA: Diagnosis not present

## 2016-07-14 DIAGNOSIS — Z7982 Long term (current) use of aspirin: Secondary | ICD-10-CM | POA: Diagnosis not present

## 2016-07-14 DIAGNOSIS — I5043 Acute on chronic combined systolic (congestive) and diastolic (congestive) heart failure: Secondary | ICD-10-CM | POA: Diagnosis not present

## 2016-07-14 DIAGNOSIS — Z79899 Other long term (current) drug therapy: Secondary | ICD-10-CM | POA: Diagnosis not present

## 2016-07-14 DIAGNOSIS — I130 Hypertensive heart and chronic kidney disease with heart failure and stage 1 through stage 4 chronic kidney disease, or unspecified chronic kidney disease: Secondary | ICD-10-CM | POA: Diagnosis not present

## 2016-07-14 DIAGNOSIS — E876 Hypokalemia: Secondary | ICD-10-CM | POA: Diagnosis not present

## 2016-07-14 DIAGNOSIS — F17210 Nicotine dependence, cigarettes, uncomplicated: Secondary | ICD-10-CM | POA: Diagnosis not present

## 2016-07-14 DIAGNOSIS — F1010 Alcohol abuse, uncomplicated: Secondary | ICD-10-CM | POA: Diagnosis present

## 2016-07-14 DIAGNOSIS — T502X5A Adverse effect of carbonic-anhydrase inhibitors, benzothiadiazides and other diuretics, initial encounter: Secondary | ICD-10-CM | POA: Diagnosis present

## 2016-07-14 DIAGNOSIS — I5041 Acute combined systolic (congestive) and diastolic (congestive) heart failure: Secondary | ICD-10-CM | POA: Diagnosis not present

## 2016-07-14 DIAGNOSIS — R0600 Dyspnea, unspecified: Secondary | ICD-10-CM | POA: Diagnosis present

## 2016-07-14 DIAGNOSIS — Z9119 Patient's noncompliance with other medical treatment and regimen: Secondary | ICD-10-CM | POA: Diagnosis not present

## 2016-07-14 DIAGNOSIS — R05 Cough: Secondary | ICD-10-CM | POA: Diagnosis not present

## 2016-07-14 DIAGNOSIS — I4581 Long QT syndrome: Secondary | ICD-10-CM | POA: Diagnosis not present

## 2016-07-14 DIAGNOSIS — K219 Gastro-esophageal reflux disease without esophagitis: Secondary | ICD-10-CM | POA: Diagnosis not present

## 2016-07-14 DIAGNOSIS — N183 Chronic kidney disease, stage 3 (moderate): Secondary | ICD-10-CM | POA: Diagnosis not present

## 2016-07-14 DIAGNOSIS — I2510 Atherosclerotic heart disease of native coronary artery without angina pectoris: Secondary | ICD-10-CM | POA: Diagnosis not present

## 2016-07-14 LAB — CBC
HCT: 43.6 % (ref 39.0–52.0)
Hemoglobin: 14.4 g/dL (ref 13.0–17.0)
MCH: 31.6 pg (ref 26.0–34.0)
MCHC: 33 g/dL (ref 30.0–36.0)
MCV: 95.6 fL (ref 78.0–100.0)
Platelets: 200 10*3/uL (ref 150–400)
RBC: 4.56 MIL/uL (ref 4.22–5.81)
RDW: 16.7 % — ABNORMAL HIGH (ref 11.5–15.5)
WBC: 7.2 10*3/uL (ref 4.0–10.5)

## 2016-07-14 LAB — BASIC METABOLIC PANEL
Anion gap: 10 (ref 5–15)
BUN: 15 mg/dL (ref 6–20)
CALCIUM: 9 mg/dL (ref 8.9–10.3)
CO2: 28 mmol/L (ref 22–32)
CREATININE: 1.33 mg/dL — AB (ref 0.61–1.24)
Chloride: 101 mmol/L (ref 101–111)
GFR, EST NON AFRICAN AMERICAN: 54 mL/min — AB (ref >60)
Glucose, Bld: 100 mg/dL — ABNORMAL HIGH (ref 65–99)
Potassium: 2.9 mmol/L — ABNORMAL LOW (ref 3.5–5.1)
Sodium: 139 mmol/L (ref 135–145)

## 2016-07-14 LAB — MAGNESIUM: MAGNESIUM: 1.8 mg/dL (ref 1.7–2.4)

## 2016-07-14 MED ORDER — MAGNESIUM SULFATE 2 GM/50ML IV SOLN
2.0000 g | Freq: Once | INTRAVENOUS | Status: AC
  Administered 2016-07-14: 2 g via INTRAVENOUS
  Filled 2016-07-14: qty 50

## 2016-07-14 MED ORDER — GUAIFENESIN-DM 100-10 MG/5ML PO SYRP
5.0000 mL | ORAL_SOLUTION | ORAL | Status: DC | PRN
  Administered 2016-07-14 (×3): 5 mL via ORAL
  Filled 2016-07-14 (×3): qty 5

## 2016-07-14 MED ORDER — POTASSIUM CHLORIDE 20 MEQ/15ML (10%) PO SOLN
40.0000 meq | Freq: Once | ORAL | Status: AC
  Administered 2016-07-14: 40 meq via ORAL
  Filled 2016-07-14: qty 30

## 2016-07-14 MED ORDER — POTASSIUM CHLORIDE CRYS ER 20 MEQ PO TBCR
30.0000 meq | EXTENDED_RELEASE_TABLET | Freq: Two times a day (BID) | ORAL | Status: AC
  Administered 2016-07-14 (×2): 30 meq via ORAL
  Filled 2016-07-14 (×2): qty 1

## 2016-07-14 NOTE — Care Management Obs Status (Signed)
MEDICARE OBSERVATION STATUS NOTIFICATION   Patient Details  Name: Ryan [name] MRN: [id number] Date of Birth: 1951-02-22   Medicare Observation Status Notification Given:  Yes    Ninfa Meeker, RN 07/14/2016, 1:56 PM

## 2016-07-14 NOTE — Progress Notes (Signed)
   Subjective: Seen and evaluated today at bedside. Feels improved since admission however still with SOB with ambulation around room. Denies any fevers overnight.   Objective:  Vital signs in last 24 hours: Vitals:   07/14/16 0743 07/14/16 0803 07/14/16 1000 07/14/16 1127  BP: 107/81  107/81 113/74  Pulse: 98   91  Resp: (!) 25 (!) 22  (!) 26  Temp: 97.5 F (36.4 C)   97.8 F (36.6 C)  TempSrc: Oral   Oral  SpO2: 99% 98%  99%  Weight:      Height:       General: In no acute distress. Resting comfortably in bed.   Cardiovascular: Regular rate and rhythm. No murmur or rub appreciated. Pulmonary: Bibasilar crackles. Normal WOB in bed. On RA Abdomen: Soft, non-tender and non-distended. +bowel sounds.  Extremities: No peripheral edema noted BL. No gross deformities.Marland Kitchen  Psych: Mood normal and affect was mood congruent. Responds to questions appropriately.   Assessment/Plan:  Active Problems:   Acute CHF (congestive heart failure) (HCC)  Acute on Chronic Combined Systolic and Diastolic CHF: In the setting of medication non-compliance. Felt to be secondary to chronic EtOH abuse. Feels better after Lasix 60mg  x2. Less SOB however still gets winded ambulating around room. Difficulty sleeping last night due to SOB. Advanced  HF team on board who rec Lasix 40mg  daily.  -HF team on board, appreciate their recommendations -Lasix 40mg  daily -Daily weights, I&Os -continued spironolactone, hydral, imdur and losartan. Consider Entresto.  -All trop <0.05, likely demand. Appears to be BL.  Hypokalemia: 2.8 today. Repleated with 70 mEq over the course of this morning. Have scheduled 4mEq once daily in setting of diuresis. Mag 1.9, replaced with IV 2g.  -Follow BMET -Repeat mag level in AM, replace if <2.   AKI: Cr stable at 1.3 since admission, follow renal function in setting of diuresis -BMET in AM  HTN: Well controlled on current regimen, continue.  Prolonged QT: On admission and again  on repeat EKG this AM. Hold offending medications. Replaced mag. Reviewed telemetry, no sustained arrhythmia.   Dispo: Anticipated discharge pending improved respiratory status.   Jesselyn Rask, DO 07/14/2016, 12:49 PM Pager: [phone]
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
## 2016-07-14 NOTE — Progress Notes (Signed)
Pt EKG showed prolonged QT this morning.  RN administered AM potassium dose scheduled for 10am at 0452. Morning labs have already been drawn. Will continue to monitor.

## 2016-07-14 NOTE — Progress Notes (Signed)
Internal Medicine Attending:   I saw and examined the patient. I reviewed the resident's note and I agree with the resident's findings and plan as documented in the resident's note.  Hospital day #2 with acute on chronic heart failure with reduced ejection fraction. He has mild hypervolemia, weight up about 5 pounds from dry today. Responding well to diuresis so far. On his usual heart failure medications, not on beta blocker chronically. Extremities are cool to the touch today, he is at risk for low output state. Heart failure service consulted and appreciate their recommendations.

## 2016-07-15 ENCOUNTER — Inpatient Hospital Stay (HOSPITAL_COMMUNITY): Payer: Medicare Other

## 2016-07-15 DIAGNOSIS — I5023 Acute on chronic systolic (congestive) heart failure: Secondary | ICD-10-CM

## 2016-07-15 LAB — BASIC METABOLIC PANEL
Anion gap: 11 (ref 5–15)
BUN: 14 mg/dL (ref 6–20)
CO2: 25 mmol/L (ref 22–32)
Calcium: 9.5 mg/dL (ref 8.9–10.3)
Chloride: 104 mmol/L (ref 101–111)
Creatinine, Ser: 1.14 mg/dL (ref 0.61–1.24)
GFR calc Af Amer: >60 mL/min (ref >60)
Glucose, Bld: 95 mg/dL (ref 65–99)
Potassium: 4.1 mmol/L (ref 3.5–5.1)
SODIUM: 140 mmol/L (ref 135–145)

## 2016-07-15 LAB — MAGNESIUM: MAGNESIUM: 2.3 mg/dL (ref 1.7–2.4)

## 2016-07-15 MED ORDER — FUROSEMIDE 10 MG/ML IJ SOLN
80.0000 mg | Freq: Two times a day (BID) | INTRAMUSCULAR | Status: DC
  Administered 2016-07-15 – 2016-07-16 (×2): 80 mg via INTRAVENOUS
  Filled 2016-07-15 (×2): qty 8

## 2016-07-15 MED ORDER — POTASSIUM CHLORIDE 20 MEQ/15ML (10%) PO SOLN
40.0000 meq | Freq: Two times a day (BID) | ORAL | Status: DC
  Administered 2016-07-15 – 2016-07-16 (×2): 40 meq via ORAL
  Filled 2016-07-15 (×2): qty 30

## 2016-07-15 NOTE — Progress Notes (Addendum)
   Subjective: Mr. Mo complains of worsened dyspnea and nonproductive cough compared to yesterday evening. He has some left sided chest and abdominal pain that worsens with coughing. He is ambulatory on room air but is becoming short of breath walking even short distances. He is still urinating frequently but complains of some constipation this morning.  Objective:  Vital signs in last 24 hours: Vitals:   07/15/16 0112 07/15/16 0114 07/15/16 0446 07/15/16 0806  BP:  100/82 101/76 113/89  Pulse: (!) 101  88 (!) 102  Resp: 11  19 (!) 30  Temp: 98 F (36.7 C)  97.8 F (36.6 C) 98.1 F (36.7 C)  TempSrc: Oral  Oral Oral  SpO2: 100%  100% 91%  Weight:   206 lb 9.6 oz (93.7 kg)   Height:       General: In no acute distress. Resting comfortably in bed.   Cardiovascular: Tachycardic with regular rhythm. No murmur.  Pulmonary: Bibasilar crackles. Slightly tachypneic with a normal work of breathing on room air Extremities: 1+ peripheral edema is present. Extremities are slightly cool without discoloration Psych: He appears somewhat uncomfortable and anxious about his shortness of breath.  Assessment/Plan: #Acute on Chronic Combined Systolic and Diastolic CHF He has more prominent complaints of orthopnea, cough, dyspnea on exertion this morning as compared to yesterday. I/O reported negative but weight is up and clinically worse. Continue intravenous diuretics again today. Per heart failure team recommendations we will plan 40 mg daily Lasix at discharge. His respiratory status looks worse today so we will check ambulatory pulse oximetry today.  #Hypokalemia Corrected with repeat supplementation of potassium and magnesium. We will continue daily monitoring while giving IV diuresis.  #HTN Normal to low today with no new changes to medications.  #Prolonged QT Appears to be a persistent chronic issue that has not corrected with magnesium replacement and holding the medications. No new plan  except try to avoid QT prolonging drugs or excessive negative chronotropes  Dispo: Anticipated discharge pending improved respiratory status, maybe today PM or tomorrow.   Collier Salina, MD PGY-II Internal Medicine Resident Pager# [phone] 07/15/2016, 1:47 PM

## 2016-07-15 NOTE — Plan of Care (Signed)
Problem: Activity: Goal: Risk for activity intolerance will decrease Outcome: Adequate for Discharge Pt ambulated on unit this evening.

## 2016-07-15 NOTE — Progress Notes (Signed)
Advanced Heart Failure Rounding Note   Subjective:    Diuresed well initially but weight back up. More SOB. + orthopnea and PND   Objective:   Weight Range:  Vital Signs:   Temp:  [97.5 F (36.4 C)-98.3 F (36.8 C)] 98.1 F (36.7 C) (05/05 0806) Pulse Rate:  [88-102] 102 (05/05 0806) Resp:  [11-30] 30 (05/05 0806) BP: (87-113)/(64-89) 113/89 (05/05 0806) SpO2:  [91 %-100 %] 91 % (05/05 0806) Weight:  [93.7 kg (206 lb 9.6 oz)] 93.7 kg (206 lb 9.6 oz) (05/05 0446) Last BM Date: 07/14/16  Weight change: Filed Weights   07/13/16 1252 07/14/16 0400 07/15/16 0446  Weight: 93.4 kg (205 lb 14.4 oz) 91.1 kg (200 lb 14.4 oz) 93.7 kg (206 lb 9.6 oz)    Intake/Output:   Intake/Output Summary (Last 24 hours) at 07/15/16 1336 Last data filed at 07/15/16 1308  Gross per 24 hour  Intake              741 ml  Output             1510 ml  Net             -769 ml     Physical Exam: General:  Well appearing. No resp difficulty HEENT: normal Neck: supple. JVP hard to see but looks up  . Carotids 2+ bilat; no bruits. No lymphadenopathy or thryomegaly appreciated. Cor: PMI laerally displaced. Regular rate & rhythm. No rubs, gallops or murmurs. Lungs: clear Abdomen: soft, nontender, mildly distended. No hepatosplenomegaly. No bruits or masses. Good bowel sounds. Extremities: no cyanosis, clubbing, rash, tr edema. Multiple surgical scars on LLE Neuro: alert & orientedx3, cranial nerves grossly intact. moves all 4 extremities w/o difficulty. Affect pleasant  Telemetry: NSR 90-100 Personally reviewed   Labs: Basic Metabolic Panel:  Recent Labs Lab 07/13/16 0730 07/13/16 2058 07/14/16 0343 07/15/16 0408  NA 139  --  139 140  K 3.6 3.5 2.9* 4.1  CL 105  --  101 104  CO2 24  --  28 25  GLUCOSE 116*  --  100* 95  BUN 13  --  15 14  CREATININE 1.33*  --  1.33* 1.14  CALCIUM 9.7  --  9.0 9.5  MG  --  1.9 1.8 2.3    Liver Function Tests: No results for input(s): AST, ALT,  ALKPHOS, BILITOT, PROT, ALBUMIN in the last 168 hours. No results for input(s): LIPASE, AMYLASE in the last 168 hours. No results for input(s): AMMONIA in the last 168 hours.  CBC:  Recent Labs Lab 07/13/16 0730 07/14/16 0343  WBC 7.5 7.2  HGB 14.7 14.4  HCT 44.3 43.6  MCV 96.1 95.6  PLT 219 200    Cardiac Enzymes:  Recent Labs Lab 07/13/16 1542 07/13/16 1959  TROPONINI 0.04* 0.04*    BNP: BNP (last 3 results)  Recent Labs  01/30/16 2330 02/27/16 1159 07/13/16 0730  BNP 516.0* 722.3* 1,556.9*    ProBNP (last 3 results) No results for input(s): PROBNP in the last 8760 hours.    Other results:  Imaging:  No results found.   Medications:     Scheduled Medications: . aspirin EC  81 mg Oral Daily  . enoxaparin (LOVENOX) injection  40 mg Subcutaneous Q24H  . folic acid  1 mg Oral Daily  . hydrALAZINE  50 mg Oral TID  . isosorbide mononitrate  30 mg Oral Daily  . losartan  25 mg Oral Daily  . multivitamin with  minerals  1 tablet Oral Daily  . potassium chloride  40 mEq Oral Daily  . sodium chloride flush  3 mL Intravenous Q12H  . spironolactone  25 mg Oral Daily  . thiamine  100 mg Oral Daily     Infusions:   PRN Medications:  acetaminophen **OR** acetaminophen, guaiFENesin-dextromethorphan, ibuprofen, senna-docusate   Assessment:   1. Acute on chronic systolic HF due to NICM. EF 20-25% by echo 9/17 2. ETOH abuse 3. AKI - improved 4. HTN  5. Hypokalemia - resolved with supplementation.  Plan/Discussion:    He is symptomatically worse today. Volume status doesn't look too bad on exam but weight is up. Agree with CXR. Will resume IV lasix  BP well controlled. Renal function and hypokalemia improved.   Not on b-blocker due to low output in past.      Length of Stay: 1   Ryan [name] 07/15/2016, 1:36 PM  Advanced Heart Failure Team Pager [phone] (M-F; 7a - 4p)  Please contact [facility] for night-coverage after hours  (4p -7a ) and weekends on [email or web address]

## 2016-07-16 DIAGNOSIS — I110 Hypertensive heart disease with heart failure: Secondary | ICD-10-CM

## 2016-07-16 DIAGNOSIS — R05 Cough: Secondary | ICD-10-CM

## 2016-07-16 DIAGNOSIS — I5043 Acute on chronic combined systolic (congestive) and diastolic (congestive) heart failure: Secondary | ICD-10-CM

## 2016-07-16 DIAGNOSIS — N179 Acute kidney failure, unspecified: Secondary | ICD-10-CM

## 2016-07-16 DIAGNOSIS — I4581 Long QT syndrome: Secondary | ICD-10-CM

## 2016-07-16 LAB — BASIC METABOLIC PANEL
ANION GAP: 8 (ref 5–15)
BUN: 19 mg/dL (ref 6–20)
CHLORIDE: 103 mmol/L (ref 101–111)
CO2: 26 mmol/L (ref 22–32)
Calcium: 9.7 mg/dL (ref 8.9–10.3)
Creatinine, Ser: 1.5 mg/dL — ABNORMAL HIGH (ref 0.61–1.24)
GFR calc non Af Amer: 47 mL/min — ABNORMAL LOW (ref >60)
GFR, EST AFRICAN AMERICAN: 54 mL/min — AB (ref >60)
GLUCOSE: 95 mg/dL (ref 65–99)
Potassium: 4.5 mmol/L (ref 3.5–5.1)
Sodium: 137 mmol/L (ref 135–145)

## 2016-07-16 MED ORDER — HYDROCOD POLST-CPM POLST ER 10-8 MG/5ML PO SUER: 5.0000 mL | Freq: Two times a day (BID) | ORAL | Status: DC | PRN

## 2016-07-16 NOTE — Progress Notes (Signed)
  Date: 07/16/2016  Patient name: Ryan [name]  Medical record number: [id number]  Date of birth: 1950/04/17   I have seen and evaluated this patient and I have discussed the plan of care with the house staff. Please see their note for complete details. I concur with their findings with the following additions/corrections: Mr Waiters was seen on morning rounds. He was lying in bed and feels better compared to yesterday. He does have a nonproductive cough and left-sided pain felt to be due to muscle strain due to coughing. His lungs are clear to auscultation today. Heart failure team also following patient and feels that he is euvolemic or slightly dry. However since he is symptomatic, then may consider a right heart cath in the morning.  Bartholomew Crews, MD 07/16/2016, 2:35 PM

## 2016-07-16 NOTE — Progress Notes (Addendum)
Advanced Heart Failure Rounding Note   Subjective:    IV diuresis restarted yesterday. Down 3 pounds. Says he feels better but still SOB with walking and lying flat.   Currently walking halls. Sats stable.   CRX from yesterday reviewed personally. No CHF or infiltrates.    Objective:   Weight Range:  Vital Signs:   Temp:  [97.2 F (36.2 C)-98.6 F (37 C)] 98.1 F (36.7 C) (05/06 1201) Pulse Rate:  [59-106] 103 (05/06 1201) Resp:  [20-31] 31 (05/06 1201) BP: (81-116)/(63-81) 99/64 (05/06 1201) SpO2:  [94 %-100 %] 94 % (05/06 1201) Weight:  [92.3 kg (203 lb 6.4 oz)] 92.3 kg (203 lb 6.4 oz) (05/06 0456) Last BM Date: 07/15/16  Weight change: Filed Weights   07/14/16 0400 07/15/16 0446 07/16/16 0456  Weight: 91.1 kg (200 lb 14.4 oz) 93.7 kg (206 lb 9.6 oz) 92.3 kg (203 lb 6.4 oz)    Intake/Output:   Intake/Output Summary (Last 24 hours) at 07/16/16 1235 Last data filed at 07/16/16 1134  Gross per 24 hour  Intake             1348 ml  Output             5150 ml  Net            -3802 ml     Physical Exam: General:  Walking halls. No resp difficulty HEENT: normal anicteric Neck: supple. Hard to see JVP.Doesn't look elevated. Carotids 2+ bilat; no bruits. No lymphadenopathy or thryomegaly appreciated. Cor: PMI nondisplaced. Regular rate & rhythm. No rubs, gallops or murmurs. Lungs: clear Abdomen: soft, nontender, nondistended. No hepatosplenomegaly. No bruits or masses. Good bowel sounds. Extremities: no cyanosis, clubbing, rash, edema LLE with multiple surgical scars  Neuro: alert & orientedx3, cranial nerves grossly intact. moves all 4 extremities w/o difficulty. Affect pleasant   Telemetry: NSR 90-100 Personally reviewed    Labs: Basic Metabolic Panel:  Recent Labs Lab 07/13/16 0730 07/13/16 2058 07/14/16 0343 07/15/16 0408 07/16/16 0207  NA 139  --  139 140 137  K 3.6 3.5 2.9* 4.1 4.5  CL 105  --  101 104 103  CO2 24  --  28 25 26   GLUCOSE 116*   --  100* 95 95  BUN 13  --  15 14 19   CREATININE 1.33*  --  1.33* 1.14 1.50*  CALCIUM 9.7  --  9.0 9.5 9.7  MG  --  1.9 1.8 2.3  --     Liver Function Tests: No results for input(s): AST, ALT, ALKPHOS, BILITOT, PROT, ALBUMIN in the last 168 hours. No results for input(s): LIPASE, AMYLASE in the last 168 hours. No results for input(s): AMMONIA in the last 168 hours.  CBC:  Recent Labs Lab 07/13/16 0730 07/14/16 0343  WBC 7.5 7.2  HGB 14.7 14.4  HCT 44.3 43.6  MCV 96.1 95.6  PLT 219 200    Cardiac Enzymes:  Recent Labs Lab 07/13/16 1542 07/13/16 1959  TROPONINI 0.04* 0.04*    BNP: BNP (last 3 results)  Recent Labs  01/30/16 2330 02/27/16 1159 07/13/16 0730  BNP 516.0* 722.3* 1,556.9*    ProBNP (last 3 results) No results for input(s): PROBNP in the last 8760 hours.    Other results:  Imaging: Dg Chest 2 View  Result Date: 07/15/2016 CLINICAL DATA:  Cough, chest congestion. EXAM: CHEST  2 VIEW COMPARISON:  Radiograph of Jul 13, 2016. FINDINGS: Stable cardiomediastinal silhouette. No pneumothorax or pleural effusion  is noted. Both lungs are clear. The visualized skeletal structures are unremarkable. IMPRESSION: No active cardiopulmonary disease. Electronically Signed   By: Marijo Conception, M.D.   On: 07/15/2016 16:20     Medications:     Scheduled Medications: . aspirin EC  81 mg Oral Daily  . enoxaparin (LOVENOX) injection  40 mg Subcutaneous Q24H  . folic acid  1 mg Oral Daily  . furosemide  80 mg Intravenous BID  . hydrALAZINE  50 mg Oral TID  . isosorbide mononitrate  30 mg Oral Daily  . losartan  25 mg Oral Daily  . multivitamin with minerals  1 tablet Oral Daily  . potassium chloride  40 mEq Oral BID  . sodium chloride flush  3 mL Intravenous Q12H  . spironolactone  25 mg Oral Daily  . thiamine  100 mg Oral Daily    Infusions:   PRN Medications: acetaminophen **OR** acetaminophen, chlorpheniramine-HYDROcodone, ibuprofen,  senna-docusate   Assessment:   1. Acute on chronic systolic HF due to NICM. EF 20-25% by echo 9/17 2. ETOH abuse 3. AKI  4. HTN  5. Hypokalemia - resolved with supplementation.  Plan/Discussion:    Volume status improved. Appears dry. Creatinine up and BP down so suspect this is as dry as we can get him. However he remains symptomatic. BNP very elevated on admit.   CXR reviewed personally and is clear. Will stop lasix. If remains symptomatic in am can consider RHC to further assess filling pressures and output. Repeat BNP in am.   K is rising. Stopping lasix. Will stop Kcl.  Length of Stay: 2   Glori Bickers MD 07/16/2016, 12:35 PM  Advanced Heart Failure Team Pager [phone] (M-F; 7a - 4p)  Please contact [facility] for night-coverage after hours (4p -7a ) and weekends on [email or web address]

## 2016-07-16 NOTE — Plan of Care (Signed)
Problem: Safety: Goal: Ability to remain free from injury will improve Outcome: Completed/Met Date Met: 07/16/16 Pt uses call light appropriately and is able to call RN/NT on white phone usi g numbers on the white board, bedside table with personal items within reach.

## 2016-07-16 NOTE — Progress Notes (Signed)
   Subjective: Ryan [name] slept better overnight and feels less short of breath compared to yesterday. He continues to have nonproductive coughing with associated left side chest and abdominal pain which is partially improved.  Objective:  Vital signs in last 24 hours: Vitals:   07/15/16 2236 07/16/16 0456 07/16/16 0754 07/16/16 1201  BP: 116/81 107/76 (!) 81/63 99/64  Pulse:  (!) 104 (!) 59 (!) 103  Resp:  20 (!) 23 (!) 31  Temp:  97.2 F (36.2 C) 97.4 F (36.3 C) 98.1 F (36.7 C)  TempSrc:  Oral Oral Oral  SpO2:  100% 100% 94%  Weight:  203 lb 6.4 oz (92.3 kg)    Height:       General: In no acute distress. Resting comfortably in bed.   Cardiovascular: RRR. No murmur.  Pulmonary: CTAB, normal WOB, he is slightly dyspneic with regular conversation at rest Extremities: 1+ peripheral edema bilateral lower extremities  Psych: Normal mood and affect  Assessment/Plan: #Acute on Chronic Combined Systolic and Diastolic CHF In's and out's report -2 L which is probably overestimate, but weight is down 3 pounds compared to yesterday. Clinically he is improved with clear lung fields throughout and is not dyspneic at rest. He is not requiring supplemental oxygen so we will check his pulse oximetry with ambulation and he may be able to discharge with outpatient follow-up if near baseline today. His previous home dose of 20 mg by mouth Lasix daily seems inadequate given his volume overload and response to diuresis during this admission.   #Cough #Left sided pain Strong suspect this is related to his resulting pulmonary edema and continued inflammation. Previous chest imaging did not show any focal infiltrate, he has no leukocytosis, has no fever causing worry for pneumonia. The cough is nonproductive so we'll start Tussionex today for symptomatic relief.  #HTN Blood pressure is soft today with increased diuresis while continuing his home antihypertensives. Considering that he reported  medication noncompliance prior to this admission we can probably reduce his daily hydralazine now that he is closer to euvolemic.  #Prolonged QT No significant arrhythmias reported  Dispo: Anticipated discharge pending improved respiratory status, maybe today PM or tomorrow.   Collier Salina, MD PGY-II Internal Medicine Resident Pager# [phone] 07/16/2016, 12:11 PM

## 2016-07-16 NOTE — Progress Notes (Signed)
Pt ambulated ~532ft on RA without difficulty. Pts O2 sats remained greater than 95% the entire time.

## 2016-07-16 NOTE — Plan of Care (Signed)
Problem: Pain Managment: Goal: General experience of comfort will improve Outcome: Completed/Met Date Met: 07/16/16 Pt has denied pain but is able to appropriately tell where his pain is, rate it using pain scale, describe what kind of pain and if he needs medication

## 2016-07-17 ENCOUNTER — Encounter: Payer: Self-pay | Admitting: *Deleted

## 2016-07-17 DIAGNOSIS — Z006 Encounter for examination for normal comparison and control in clinical research program: Secondary | ICD-10-CM

## 2016-07-17 LAB — BASIC METABOLIC PANEL
Anion gap: 7 (ref 5–15)
BUN: 18 mg/dL (ref 6–20)
CO2: 29 mmol/L (ref 22–32)
Calcium: 9.6 mg/dL (ref 8.9–10.3)
Chloride: 101 mmol/L (ref 101–111)
Creatinine, Ser: 1.48 mg/dL — ABNORMAL HIGH (ref 0.61–1.24)
GFR calc Af Amer: 55 mL/min — ABNORMAL LOW (ref >60)
GFR, EST NON AFRICAN AMERICAN: 48 mL/min — AB (ref >60)
Glucose, Bld: 102 mg/dL — ABNORMAL HIGH (ref 65–99)
POTASSIUM: 4 mmol/L (ref 3.5–5.1)
SODIUM: 137 mmol/L (ref 135–145)

## 2016-07-17 MED ORDER — STUDY - GALACTIC STUDY - PLACEBO / OMECAMTIV MECARBIL (PI-MCLEAN)
1.0000 | Freq: Two times a day (BID) | Status: DC
  Administered 2016-07-17: 1 via ORAL
  Filled 2016-07-17 (×2): qty 1

## 2016-07-17 MED ORDER — STUDY - GALACTIC STUDY - PLACEBO / OMECAMTIV MECARBIL (PI-MCLEAN)
1.0000 | Freq: Once | Status: AC
  Administered 2016-07-17: 1 via ORAL
  Filled 2016-07-17: qty 1

## 2016-07-17 MED ORDER — FUROSEMIDE 20 MG PO TABS: 40.0000 mg | ORAL_TABLET | Freq: Every day | ORAL | 0 refills | Status: DC

## 2016-07-17 NOTE — Progress Notes (Signed)
   Subjective: Ryan [name] was seen and evaluated today at bedside. Reports he feels "great" and was able to walk up and down the halls with minimal dyspnea. Slept better overnight and feels less short of breath compared to yesterday.   Objective:  Vital signs in last 24 hours: Vitals:   07/16/16 1946 07/16/16 2116 07/16/16 2332 07/17/16 0534  BP: 111/77 105/76 92/69 102/69  Pulse: 94  79   Resp: 18  14   Temp: 97.8 F (36.6 C)  97.6 F (36.4 C) 97.3 F (36.3 C)  TempSrc: Oral  Oral Oral  SpO2: 100%  100%   Weight:    202 lb 9.6 oz (91.9 kg)  Height:       General: In no acute distress. Resting comfortably in bed.   Cardiovascular: RRR. No murmur.  Pulmonary: CTAB, normal WOB. Speaks in complete sentences without dyspnea Extremities: Warm, dry and well perfused. Trace to no peripheral edema.  Psych: Normal mood and affect  Assessment/Plan: #Acute on Chronic Combined Systolic and Diastolic CHF  -8.0D yest, -8.5L overall. Weight today 202 lbs, appears to be euvolemic on exam. BL LE warmer since admission. Lungs clear and he is no longer dyspneic at rest. Not requiring supplemental oxygen.  -Ambulate with pulse ox to evaluate for need prior to d/c -Encouraged close follow-up with HF clinic as well as strict medication compliance -HF recs holding lasix today and resuming tomorrow with 40 mg PO. Needs f/u BMET in 1 week in clinic -Also rec continuing hydral 50mg  TID, Imdur 30mg  daily, losartan 25mg  daily, spironolactone 25mg  daily.   #Cough #Left sided pain with coughing Both nearly resolved. CP sharp and only with cough. Previous chest imaging did not show any focal infiltrate, he has no leukocytosis, has no fever causing worry for pneumonia. Symptomatic treatment.  #HTN BP improved since adjusting hydral dose over the weekend. Will continue this regimen upon d/c  #Prolonged QT No significant arrhythmias reported  Dispo: Anticipated discharge today. Awaiting final note from HF  and O2 sats with ambulation.  Einar Gip, DO Internal Medicine Resident - PGY-1 Pager# [phone] 07/17/2016, 7:43 AM

## 2016-07-17 NOTE — Discharge Instructions (Signed)
Mr. Rhett, please start taking LASIX 40 MG ONCE DAILY STARTING Tuesday MAY 8TH.

## 2016-07-17 NOTE — Progress Notes (Signed)
SATURATION QUALIFICATIONS: (This note is used to comply with regulatory documentation for home oxygen)  Patient Saturations on Room Air at Rest = 98%  Patient Saturations on Room Air while Ambulating = 100%  Patient Saturations on 2 Liters of oxygen while Ambulating = 100%  Please briefly explain why patient needs home oxygen:

## 2016-07-17 NOTE — Progress Notes (Signed)
Transitions of Care Pharmacy Note  Plan:  Educated on changes in his furosemide and hydralazine doses  Outpatient follow-up: blood pressure, weights  --------------------------------------------- Ryan [name] is an 66 y.o. male who presents with a chief complaint CHF. In anticipation of discharge, pharmacy has reviewed this patient's prior to admission medication history, as well as current inpatient medications listed per the [facility].  Current medication indications, dosing, frequency, and notable side effects reviewed with patient and family. patient and family verbalized understanding of current inpatient medication regimen and is aware that the After Visit Summary when presented, will represent the most accurate medication list at discharge.   Ryan [name] did not express any concerns regarding his medications. I encouraged him to compare his new medication list (doses especially) to his medication bottles at home. I suggested monitoring his blood pressures as needed and daily weights per his MD.   Patient reported no further questions or concerns at the conclusion of his visit.   Assessment: Understanding of regimen: fair Understanding of indications: fair Potential of compliance: fair  Patient instructed to contact inpatient pharmacy team with further questions or concerns if needed.    Time spent preparing for discharge counseling: 10 min  Time spent counseling patient: 10 min    Ryan [name], PharmD Pharmacy Resident  Pager [phone] 07/17/16 3:56 PM

## 2016-07-17 NOTE — Progress Notes (Signed)
Advanced Heart Failure Rounding Note   Subjective:    Feels great today. Has gotten up and walked. Creatinine 1.50->1.48 today.   Denies SOB and orthopnea. No chest pain.    Objective:   Weight Range:  Vital Signs:   Temp:  [97.3 F (36.3 C)-98.1 F (36.7 C)] 97.6 F (36.4 C) (05/07 0743) Pulse Rate:  [79-103] 98 (05/07 0743) Resp:  [14-31] 20 (05/07 0743) BP: (92-111)/(64-77) 104/77 (05/07 0743) SpO2:  [94 %-100 %] 100 % (05/07 0743) Weight:  [202 lb 9.6 oz (91.9 kg)] 202 lb 9.6 oz (91.9 kg) (05/07 0534) Last BM Date: 07/16/16  Weight change: Filed Weights   07/15/16 0446 07/16/16 0456 07/17/16 0534  Weight: 206 lb 9.6 oz (93.7 kg) 203 lb 6.4 oz (92.3 kg) 202 lb 9.6 oz (91.9 kg)    Intake/Output:   Intake/Output Summary (Last 24 hours) at 07/17/16 0800 Last data filed at 07/17/16 0545  Gross per 24 hour  Intake             1048 ml  Output             2350 ml  Net            -1302 ml     Physical Exam: General:  Well appearing male, up walking in his room.  HEENT: Normal.  Neck: supple. No JVP.  Carotids 2+ bilat; no bruits. No lymphadenopathy or thryomegaly appreciated. Cor: PMI nondisplaced. Regular rate and rhythm. No rubs, gallops or murmurs. Lungs: Clear in all lobes bilterally.  Abdomen: Soft, non tender, non distended.  No hepatosplenomegaly. No bruits or masses. Good bowel sounds. Extremities: no cyanosis, clubbing, rash, No edema. Warm.  Neuro: alert & orientedx3, cranial nerves grossly intact. moves all 4 extremities w/o difficulty. Affect pleasant   Telemetry: NSR in the 90's.     Labs: Basic Metabolic Panel:  Recent Labs Lab 07/13/16 0730 07/13/16 2058 07/14/16 0343 07/15/16 0408 07/16/16 0207 07/17/16 0506  NA 139  --  139 140 137 137  K 3.6 3.5 2.9* 4.1 4.5 4.0  CL 105  --  101 104 103 101  CO2 24  --  28 25 26 29   GLUCOSE 116*  --  100* 95 95 102*  BUN 13  --  15 14 19 18   CREATININE 1.33*  --  1.33* 1.14 1.50* 1.48*    CALCIUM 9.7  --  9.0 9.5 9.7 9.6  MG  --  1.9 1.8 2.3  --   --      CBC:  Recent Labs Lab 07/13/16 0730 07/14/16 0343  WBC 7.5 7.2  HGB 14.7 14.4  HCT 44.3 43.6  MCV 96.1 95.6  PLT 219 200    Cardiac Enzymes:  Recent Labs Lab 07/13/16 1542 07/13/16 1959  TROPONINI 0.04* 0.04*    BNP: BNP (last 3 results)  Recent Labs  01/30/16 2330 02/27/16 1159 07/13/16 0730  BNP 516.0* 722.3* 1,556.9*      Imaging: Dg Chest 2 View  Result Date: 07/15/2016 CLINICAL DATA:  Cough, chest congestion. EXAM: CHEST  2 VIEW COMPARISON:  Radiograph of Jul 13, 2016. FINDINGS: Stable cardiomediastinal silhouette. No pneumothorax or pleural effusion is noted. Both lungs are clear. The visualized skeletal structures are unremarkable. IMPRESSION: No active cardiopulmonary disease. Electronically Signed   By: Marijo Conception, M.D.   On: 07/15/2016 16:20     Medications:     Scheduled Medications: . aspirin EC  81 mg Oral Daily  .  enoxaparin (LOVENOX) injection  40 mg Subcutaneous Q24H  . folic acid  1 mg Oral Daily  . hydrALAZINE  50 mg Oral TID  . isosorbide mononitrate  30 mg Oral Daily  . losartan  25 mg Oral Daily  . multivitamin with minerals  1 tablet Oral Daily  . sodium chloride flush  3 mL Intravenous Q12H  . spironolactone  25 mg Oral Daily  . thiamine  100 mg Oral Daily    Infusions:   PRN Medications: acetaminophen **OR** acetaminophen, chlorpheniramine-HYDROcodone, ibuprofen, senna-docusate   Assessment & Plan.    1. Acute on chronic systolic HF due to NICM. EF 20-25% by echo 9/17 - Appears dry.  - Hold Lasix today. Then restart Lasix tomorrow at 40mg  daily. Will get BMET in the clinic in 7 days.  - Continue hydralazine 50mg  TID.  - Continue Imdur 30mg  daily - Continue losartan 25mg  daily.  - Continue Spiro 25 mg daily.  2. ETOH abuse - Encouraged cessation 3. AKI  - Creatinine bumped likely due to over diuresis. Will repeat BMET in clinic.  4. HTN  -  BP stable.  5. Hypokalemia - resolved with supplementation.   Length of Stay: [place], NP-C  07/17/2016, 8:00 AM  Advanced Heart Failure Team Pager [phone] (M-F; 7a - 4p)  Please contact [facility] for night-coverage after hours (4p -7a ) and weekends on [email or web address]  Patient seen and examined with Jettie Booze, NP. We discussed all aspects of the encounter. I agree with the assessment and plan as stated above.   He feels better today. Able to ambulate without difficulty. Renal function stable overnight. Agree with meds as listed above. [place] for d/c today. We will follow closely in HF Clinic. He has been enrolled in the [place] trial [facility]).   Glori Bickers, MD  9:52 PM

## 2016-07-17 NOTE — Research (Signed)
Pt consented to be in [facility] HF Research study.   All questions and concerns were answered.  No study procedures were completed before signed consent.  EKG obtained, labs collected, vital signs collected, and [facility] medication administered.  Pt instructed to contact the Cardiac Research [facility] and HF Clinic if any questions.  Will follow up with the patient in two weeks.

## 2016-07-17 NOTE — Research (Signed)
Patient met eligibility criteria for [facility] HF study.  Pt agrees to be in the study and informed consent left for patient to review.  Will follow up with patient soon.   

## 2016-07-17 NOTE — Discharge Summary (Signed)
Name: Ryan [name] MRN: [id number] DOB: February 04, 1951 66 y.o. PCP: unknown  Date of Admission: 07/13/2016  7:22 AM Date of Discharge: 07/17/2016 Attending Physician: Ryan [name], *  Discharge Diagnosis: 1. Acute on Chronic Combined Systolic and Diastolic CHF 2. AKI 3. Prolonged QT  Active Problems:   Acute CHF (congestive heart failure) ([place])  Discharge Medications: Allergies as of 07/17/2016   No Known Allergies     Medication List    TAKE these medications   acetaminophen 325 MG tablet Commonly known as:  TYLENOL Take 650 mg by mouth every 6 (six) hours as needed for mild pain.   aspirin 81 MG EC tablet Take 1 tablet (81 mg total) by mouth daily.   carvedilol 3.125 MG tablet Commonly known as:  COREG Take 1 tablet (3.125 mg total) by mouth 2 (two) times daily with a meal.   furosemide 20 MG tablet Commonly known as:  LASIX Take 2 tablets (40 mg total) by mouth daily. Take an extra 20 mg (1 tablet) for weight gain > 3 lbs in 1 day or > 5 lbs in 1 week Start taking on:  07/18/2016 What changed:  how much to take   gabapentin 300 MG capsule Commonly known as:  NEURONTIN Take 300 mg by mouth 2 (two) times daily.   hydrALAZINE 50 MG tablet Commonly known as:  APRESOLINE Take 1 tablet (50 mg total) by mouth 3 (three) times daily. What changed:  how much to take   isosorbide mononitrate 30 MG 24 hr tablet Commonly known as:  IMDUR Take 1 tablet (30 mg total) by mouth daily.   LORazepam 1 MG tablet Commonly known as:  ATIVAN Take 1 tablet (1 mg total) by mouth 3 (three) times daily as needed for anxiety.   losartan 25 MG tablet Commonly known as:  COZAAR Take 1 tablet (25 mg total) by mouth daily.   oxyCODONE 5 MG immediate release tablet Commonly known as:  Oxy IR/ROXICODONE Take 1 tablet (5 mg total) by mouth every 6 (six) hours as needed for moderate pain.   spironolactone 25 MG tablet Commonly known as:  ALDACTONE Take 1 tablet (25 mg total) by  mouth daily.       Disposition and follow-up:   Ryan [name] was discharged from [facility] in stable condition.  At the hospital follow up visit please address:  1.  Heart Failure: Please ensure and encourage Ryan [name] compliance with lasix, salt restriction, daily weights and fluid restriction. He will need to establish again with HF in the outpatient setting with close follow-up with both HF and his PCP. Could consider RHC in future. AKI: Please follow bmet to ensure AKI has resolved.  Prolonged QT: Avoid QT prolonging medications  2.  Labs / imaging needed at time of follow-up: BMET  3.  Pending labs/ test needing follow-up: none.  Follow-up Appointments: Follow-up Information    [facility] Follow up on 07/25/2016.   Specialty:  Cardiology Why:  at 1100 am for post hospital follow up. Please bring all of your medications to your visit. The code for parking is 6001. Contact information: [address] [id number] [place]. Schedule an appointment as soon as possible for a visit in 1 week(s).   Why:  To establish primary care with provider at the internal medicine clinic if desired Contact information: [address]       Ryan Bouquet, MD Follow up.   Specialty:  Internal Medicine          Hospital Course by problem list: Active Problems:   Acute CHF (congestive heart failure) ([place])   1. Acute on Chronic Combined Diastolic and Systolic CHF 66 y/o M with LVEF 25% and grade 2 diastolic dysfunction per ECHO 9/2017with poor medication compliance here with 3 days of worsening SOB, orthopnea and cough productive of clear sputum. Endorsed medication non-compliance for some time and on examination he was volume-up as evidenced by JVD, bibasilar crackles, CXR with  pulmonary edema and mild pitting edema BL LE. He was diuresed with the assistance of the HF team net 8.5L and appeared euvolemic at 202 lbs at time of discharge.  2. Acute Kidney Injury Patient developed a bump in his creatinine secondary to diuresis. Lasix was held for 2 days and he will have repeat BMET at his follow-up appointment in 1 week.   3. Prolonged QT  QTc 533 upon admission. He was monitored closely on telemetry and was without significant arrhythmia. Magnesium was replaced to a value of 2.3.   Discharge Vitals:   BP 111/75 (BP Location: Left Arm)   Pulse 89   Temp 97.7 F (36.5 C) (Oral)   Resp (!) 26   Ht 5\' 11"  (1.803 m)   Wt 202 lb 9.6 oz (91.9 kg)   SpO2 99%   BMI 28.26 kg/m   Pertinent Labs, Studies, and Procedures:  CXR 5/3: Cardiomegaly with vascular congestion and patchy bilateral airspace disease, R>L  Discharge Instructions: Ryan [name] it is important that you take your medications as prescribed, including lasix (the water pill). Please also weigh yourself daily and be mindful of the salt and fluid you are taking in during the day. Please call the [facility] to set up an appointment to follow-up from your hospitalization and also to establish with a primary care provider in our clinic. You will need to follow-up with heart failure as well.   SignedEinar Gip, DO 07/17/2016, 2:18 PM   Pager: [phone]

## 2016-07-17 NOTE — Progress Notes (Signed)
EKG done per research team order with notable TWI in leads I,AVL and V5. V6 had TWI prior. Dr. Danford Bad called and updated for instruction to d/c as ordered in light of above note.  OK order given to discharge pt as ordered.

## 2016-07-17 NOTE — Progress Notes (Signed)
Updated report in patient's room via [facility] RN using SBAR format, reviewed events of the day, new orders, POC, VS, assumed care of patient.

## 2016-07-18 ENCOUNTER — Encounter: Payer: Self-pay | Admitting: *Deleted

## 2016-07-19 MED FILL — FUROSEMIDE 20 MG TABLET: 20 | 15 days supply | Qty: 30 | Fill #0

## 2016-07-20 NOTE — Telephone Encounter (Signed)
This encounter was created in error - please disregard.

## 2016-07-25 ENCOUNTER — Inpatient Hospital Stay (HOSPITAL_COMMUNITY): Payer: Medicare Other

## 2016-07-26 ENCOUNTER — Inpatient Hospital Stay (HOSPITAL_COMMUNITY)
Admission: RE | Admit: 2016-07-26 | Discharge: 2016-07-26 | Disposition: A | Discharge status: Home / Self Care | Payer: Medicare Other | Source: Ambulatory Visit

## 2016-07-26 VITALS — BP 113/70 | HR 72 | Resp 15 | Wt 193.8 lb

## 2016-07-26 DIAGNOSIS — Z006 Encounter for examination for normal comparison and control in clinical research program: Secondary | ICD-10-CM

## 2016-07-27 ENCOUNTER — Ambulatory Visit (HOSPITAL_COMMUNITY)
Admission: RE | Admit: 2016-07-27 | Discharge: 2016-07-27 | Disposition: A | Discharge status: Home / Self Care | Payer: Medicare Other | Source: Ambulatory Visit | Attending: Internal Medicine | Admitting: Internal Medicine

## 2016-07-27 ENCOUNTER — Encounter (HOSPITAL_COMMUNITY): Payer: Self-pay

## 2016-07-27 ENCOUNTER — Telehealth (HOSPITAL_COMMUNITY): Payer: Self-pay | Admitting: Cardiology

## 2016-07-27 VITALS — BP 140/90 | HR 93 | Ht 71.0 in | Wt 207.4 lb

## 2016-07-27 DIAGNOSIS — I517 Cardiomegaly: Secondary | ICD-10-CM | POA: Diagnosis not present

## 2016-07-27 DIAGNOSIS — R05 Cough: Secondary | ICD-10-CM | POA: Insufficient documentation

## 2016-07-27 DIAGNOSIS — J209 Acute bronchitis, unspecified: Secondary | ICD-10-CM

## 2016-07-27 DIAGNOSIS — I5041 Acute combined systolic (congestive) and diastolic (congestive) heart failure: Secondary | ICD-10-CM | POA: Diagnosis not present

## 2016-07-27 DIAGNOSIS — R059 Cough, unspecified: Secondary | ICD-10-CM

## 2016-07-27 DIAGNOSIS — I5042 Chronic combined systolic (congestive) and diastolic (congestive) heart failure: Secondary | ICD-10-CM | POA: Insufficient documentation

## 2016-07-27 DIAGNOSIS — R0602 Shortness of breath: Secondary | ICD-10-CM | POA: Diagnosis not present

## 2016-07-27 DIAGNOSIS — I110 Hypertensive heart disease with heart failure: Secondary | ICD-10-CM | POA: Diagnosis not present

## 2016-07-27 LAB — BASIC METABOLIC PANEL
Anion gap: 9 (ref 5–15)
BUN: 20 mg/dL (ref 6–20)
CHLORIDE: 104 mmol/L (ref 101–111)
CO2: 26 mmol/L (ref 22–32)
CREATININE: 1.28 mg/dL — AB (ref 0.61–1.24)
Calcium: 9 mg/dL (ref 8.9–10.3)
GFR calc Af Amer: >60 mL/min (ref >60)
GFR calc non Af Amer: 57 mL/min — ABNORMAL LOW (ref >60)
GLUCOSE: 93 mg/dL (ref 65–99)
Potassium: 4.6 mmol/L (ref 3.5–5.1)
SODIUM: 139 mmol/L (ref 135–145)

## 2016-07-27 LAB — CBC
HCT: 47.3 % (ref 39.0–52.0)
Hemoglobin: 15.5 g/dL (ref 13.0–17.0)
MCH: 31.6 pg (ref 26.0–34.0)
MCHC: 32.8 g/dL (ref 30.0–36.0)
MCV: 96.3 fL (ref 78.0–100.0)
PLATELETS: 267 10*3/uL (ref 150–400)
RBC: 4.91 MIL/uL (ref 4.22–5.81)
RDW: 16 % — AB (ref 11.5–15.5)
WBC: 6.7 10*3/uL (ref 4.0–10.5)

## 2016-07-27 LAB — BRAIN NATRIURETIC PEPTIDE: B Natriuretic Peptide: 530.6 pg/mL — ABNORMAL HIGH (ref 0.0–100.0)

## 2016-07-27 MED ORDER — HYDRALAZINE HCL 50 MG PO TABS: 50.0000 mg | ORAL_TABLET | Freq: Three times a day (TID) | ORAL | 3 refills | Status: DC

## 2016-07-27 MED ORDER — DOXYCYCLINE HYCLATE 50 MG PO CAPS: 100.0000 mg | ORAL_CAPSULE | Freq: Two times a day (BID) | ORAL | 0 refills | Status: AC

## 2016-07-27 MED FILL — DOXYCYCLINE HYC 50 MG CAP: 50 | 14 days supply | Qty: 28 | Fill #0

## 2016-07-27 MED FILL — hydrALAZINE HCL 50 MG TABS: 50 | 30 days supply | Qty: 90 | Fill #0
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
## 2016-07-27 NOTE — Patient Instructions (Signed)
INCREASE Hydralazine to 50 mg, one tab three times per day  Labs today We will only contact you if something comes back abnormal or we need to make some changes. Otherwise no news is good news!   Your physician recommends that you schedule a follow-up appointment in: 3-4 weeks with Doroteo Bradford, PharmD  Your physician recommends that you schedule a follow-up appointment in: 2 months with Dr Haroldine Laws and echo  Your physician has requested that you have an echocardiogram. Echocardiography is a painless test that uses sound waves to create images of your heart. It provides your doctor with information about the size and shape of your heart and how well your heart's chambers and valves are working. This procedure takes approximately one hour. There are no restrictions for this procedure.   Do the following things EVERYDAY: 1) Weigh yourself in the morning before breakfast. Write it down and keep it in a log. 2) Take your medicines as prescribed 3) Eat low salt foods-Limit salt (sodium) to 2000 mg per day.  4) Stay as active as you can everyday 5) Limit all fluids for the day to less than 2 liters

## 2016-07-27 NOTE — Telephone Encounter (Signed)
Patient aware, rx sent. ?

## 2016-07-27 NOTE — Telephone Encounter (Signed)
-----   Message from Shirley Friar, PA-C sent at 07/27/2016 10:47 AM EDT ----- Labs OK.      Per DB would like to cover with Doxycycline 100 mg BID x 7 days for possible COPD exacerbation.   Legrand Como [address]" [place], PA-C 07/27/2016 10:44 AM

## 2016-08-03 ENCOUNTER — Encounter: Payer: Medicare Other | Admitting: *Deleted

## 2016-08-03 DIAGNOSIS — Z006 Encounter for examination for normal comparison and control in clinical research program: Secondary | ICD-10-CM

## 2016-08-03 NOTE — Progress Notes (Signed)
Pt presented to Research Dept to follow up on Week 2of [facility] HF Study. Vital signs taken. No signs/symptoms of ACS. Patient states he is compliant with medications. Pt failed to return medication boxes. He did not take his morning IP and he will take when he gets home.  Labs drawn. Pt stating he is feeling better with his cough since he was prescribed ABX Doxycycline. Will follow up with patient in 2 weeks

## 2016-08-03 NOTE — Progress Notes (Signed)
Pt presented to Research Dept to follow up on Week 2 of [facility] HF Study. Vital signs taken.  No signs/symptoms of ACS. Patient states he is compliant with medications.  Pt failed to return medication boxes.  He did not take his morning IP and he will take when he gets home.  Labs drawn.  Pt stating he is feeling better with his cough since he was prescribed ABX Doxycycline. Will follow up with patient in 2 weeks.

## 2016-08-17 ENCOUNTER — Other Ambulatory Visit: Payer: Self-pay | Admitting: *Deleted

## 2016-08-17 ENCOUNTER — Other Ambulatory Visit (HOSPITAL_COMMUNITY): Payer: Self-pay | Admitting: *Deleted

## 2016-08-17 ENCOUNTER — Encounter: Payer: Self-pay | Admitting: *Deleted

## 2016-08-17 DIAGNOSIS — Z006 Encounter for examination for normal comparison and control in clinical research program: Secondary | ICD-10-CM

## 2016-08-17 MED ORDER — ISOSORBIDE MONONITRATE ER 30 MG PO TB24: 30.0000 mg | ORAL_TABLET | Freq: Every day | ORAL | 3 refills | Status: DC

## 2016-08-17 MED ORDER — STUDY - INVESTIGATIONAL MEDICATION: 1.0000 | Freq: Two times a day (BID) | 99 refills | Status: DC

## 2016-08-17 MED FILL — ISOSORBIDE MN ER 30 MG TAB: 30 | 30 days supply | Qty: 30 | Fill #0

## 2016-08-17 NOTE — Progress Notes (Addendum)
RESEARCH ENCOUNTER  Patient ID: EWEN VARNELL  DOB: [birth year]  [name] presented to the [facility] for Week 4 visit of the [facility].  No signs/symptoms of ACS since the last visit. IP returned and additional IP dispensed.  IP Box # [id number] not returned; patient stated he threw the box away.  Patient educated not to throw boxes away and to return all IP and empty boxes.  Study Coordinators also discussed medication compliance.  Patient provided a pill box and RNs assisted patient with filling the box with all medications.  Coordinators reinforced the importance of taking all medications consistently and as prescribed.   Patient will follow up with the [facility] in 2 weeks and Coordinators will review medication compliance at that time as well.

## 2016-08-23 ENCOUNTER — Ambulatory Visit (HOSPITAL_COMMUNITY)
Admission: RE | Admit: 2016-08-23 | Discharge: 2016-08-23 | Disposition: A | Discharge status: Home / Self Care | Payer: Medicare Other | Source: Ambulatory Visit | Attending: Internal Medicine | Admitting: Internal Medicine

## 2016-08-23 DIAGNOSIS — F1910 Other psychoactive substance abuse, uncomplicated: Secondary | ICD-10-CM | POA: Insufficient documentation

## 2016-08-23 DIAGNOSIS — F17210 Nicotine dependence, cigarettes, uncomplicated: Secondary | ICD-10-CM | POA: Insufficient documentation

## 2016-08-23 DIAGNOSIS — I1310 Hypertensive heart and chronic kidney disease without heart failure, with stage 1 through stage 4 chronic kidney disease, or unspecified chronic kidney disease: Secondary | ICD-10-CM | POA: Insufficient documentation

## 2016-08-23 DIAGNOSIS — J449 Chronic obstructive pulmonary disease, unspecified: Secondary | ICD-10-CM | POA: Insufficient documentation

## 2016-08-23 DIAGNOSIS — I5022 Chronic systolic (congestive) heart failure: Secondary | ICD-10-CM | POA: Diagnosis present

## 2016-08-23 DIAGNOSIS — N183 Chronic kidney disease, stage 3 (moderate): Secondary | ICD-10-CM | POA: Insufficient documentation

## 2016-08-23 DIAGNOSIS — Z9114 Patient's other noncompliance with medication regimen: Secondary | ICD-10-CM | POA: Insufficient documentation

## 2016-08-23 MED ORDER — SACUBITRIL-VALSARTAN 24-26 MG PO TABS: 1.0000 | ORAL_TABLET | Freq: Two times a day (BID) | ORAL | 1 refills | Status: DC

## 2016-08-23 MED FILL — FUROSEMIDE 20 MG TABLET: 20 | 33 days supply | Qty: 100 | Fill #0

## 2016-08-23 MED FILL — ENTRESTO 24 MG-26 MG TABLET: 24-26 | 30 days supply | Qty: 60 | Fill #0

## 2016-08-23 MED FILL — SPIRONOLACTONE 25 MG TABLET: 25 | 34 days supply | Qty: 34 | Fill #0
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
## 2016-08-23 NOTE — Patient Instructions (Addendum)
It was great to see you today!   Please STOP losartan.  Please START Entresto 24-26 mg TWICE DAILY.   Blood work scheduled for 6/20 at 11:00 am.   Please keep your appointment with Dr. Haroldine Laws on 09/14/16.

## 2016-08-24 ENCOUNTER — Telehealth: Payer: Self-pay | Admitting: *Deleted

## 2016-08-25 ENCOUNTER — Other Ambulatory Visit (HOSPITAL_COMMUNITY): Payer: Self-pay | Admitting: Pharmacist

## 2016-08-25 ENCOUNTER — Telehealth (HOSPITAL_COMMUNITY): Payer: Self-pay | Admitting: Pharmacist

## 2016-08-25 MED ORDER — SACUBITRIL-VALSARTAN 24-26 MG PO TABS: 1.0000 | ORAL_TABLET | Freq: Two times a day (BID) | ORAL | 11 refills | Status: DC

## 2016-08-25 NOTE — Telephone Encounter (Signed)
Interim 30 day supply of Entresto approved by [employer] on 08/25/2016 .  Carlean Jews, Pharm.D. PGY1 Pharmacy Resident 6/15/201812:57 PM Pager [phone]

## 2016-08-29 ENCOUNTER — Telehealth (HOSPITAL_COMMUNITY): Payer: Self-pay | Admitting: Pharmacist

## 2016-08-29 ENCOUNTER — Telehealth: Payer: Self-pay | Admitting: *Deleted

## 2016-08-29 NOTE — Telephone Encounter (Signed)
Called patient to inform him that he needs to contact Medicare [place] at [phone] for further assistance with [facility] coverage. If patient is denied by Medicare, patient will receive a letter in the mail which he will need to bring to clinic. No answer on cell phone or home phone. No voicemail available.  Carlean Jews, Pharm.D. PGY1 Pharmacy Resident 6/19/20183:09 PM Pager [phone]

## 2016-08-30 ENCOUNTER — Inpatient Hospital Stay (HOSPITAL_COMMUNITY): Admission: RE | Admit: 2016-08-30 | Payer: Medicare Other | Source: Ambulatory Visit

## 2016-08-30 ENCOUNTER — Other Ambulatory Visit (HOSPITAL_COMMUNITY): Payer: Self-pay | Admitting: *Deleted

## 2016-08-30 DIAGNOSIS — I5022 Chronic systolic (congestive) heart failure: Secondary | ICD-10-CM

## 2016-08-31 ENCOUNTER — Telehealth: Payer: Self-pay | Admitting: *Deleted

## 2016-09-01 ENCOUNTER — Telehealth: Payer: Self-pay | Admitting: *Deleted

## 2016-09-01 ENCOUNTER — Encounter: Payer: Self-pay | Admitting: *Deleted

## 2016-09-04 ENCOUNTER — Telehealth: Payer: Self-pay | Admitting: *Deleted

## 2016-09-14 ENCOUNTER — Encounter (HOSPITAL_COMMUNITY): Payer: Medicare Other | Admitting: Internal Medicine

## 2016-09-14 ENCOUNTER — Ambulatory Visit (HOSPITAL_COMMUNITY): Admission: RE | Admit: 2016-09-14 | Payer: Medicare Other | Source: Ambulatory Visit

## 2016-09-14 ENCOUNTER — Encounter: Payer: Self-pay | Admitting: *Deleted

## 2016-09-15 ENCOUNTER — Telehealth: Payer: Self-pay | Admitting: *Deleted

## 2016-09-15 NOTE — Telephone Encounter (Signed)
Spoke with Mr. Mcfetridge on the phone about medication compliance.  Pt supplied with weekly medication pill box at previous Research appointment.  Mr. Zaring stated that he was going to get his girlfriend to fill his pill box when she got home from work.  Patient scheduled for Research visit on June 19th.  Will follow up at that time.

## 2016-09-15 NOTE — Telephone Encounter (Signed)
Attempted to contact Ryan [name] to reschedule Week 6 [facility] HF Research appointment.  I was able to reach the subject as he was walking into another doctors appointment.  Patient stated that he would call me back later and I stressed the importance of rescheduling the appointment.  Unfortunately, the patient did not call back today.  Will attempt to reach patient Monday.

## 2016-09-15 NOTE — Telephone Encounter (Signed)
Contacted the patient via phone.  Patient states that he is doing better but that his hip is still bothering him.  I asked the patient if he is able to come to the clinic soon, possibly Monday or Tuesday, so that way he does not run out of his study drug.  Patient stated that Tuesday would be better and the time "depends on who take [him]."  I let the patient know that any time that day will be fine but we will discussed scheduling 11:00am, on Tuesday July 10th.  Patient advised to contact the clinic if he has any questions or concerns and patient verbalized understanding.

## 2016-09-15 NOTE — Telephone Encounter (Signed)
Ryan [name] failed to show for his scheduled Research appointment today, June 21st at 12:00.  Attempted to contact the patient two times via three telephone numbers with no answer.  Voicemail on one number is a male and other voicemail is a clinic.  Will attempt to reach patient tomorrow.

## 2016-09-15 NOTE — Telephone Encounter (Signed)
Ryan [name] failed to show for his scheduled Research appointment today, June 19th at 10:00am.  Was able to contact patient via phone and scheduled appointment changed to June 21st at 12:00.  Patient stated that he will get his fiance to drive him Thursday.  She is unable to bring him tomorrow because she has a doctors appointment.

## 2016-09-15 NOTE — Telephone Encounter (Addendum)
Attempted to contact patient via phone about [facility] HF Week 6 appointment on three different numbers provided.  Patient did not answer.  Patient has scheduled appointment with [facility] on July 5th.

## 2016-09-19 ENCOUNTER — Telehealth: Payer: Self-pay | Admitting: *Deleted

## 2016-09-19 NOTE — Telephone Encounter (Signed)
Ryan [name] left a voicemail message with the [facility] stating that he is unable to make his appointment scheduled on 7/10 due to his leg hurting.  Will follow up with the patient to reschedule.

## 2016-09-21 ENCOUNTER — Telehealth: Payer: Self-pay

## 2016-09-21 NOTE — Telephone Encounter (Signed)
Left message offering transportation to study visit provided by [facility]. Ask to call [phone].

## 2016-10-04 ENCOUNTER — Telehealth: Payer: Self-pay | Admitting: *Deleted

## 2016-10-04 NOTE — Telephone Encounter (Signed)
Called "Mobile" number listed in Epic and there was no answer and no voicemail.  Called the "Home" number and reached subjects sister, Marsella.  She stated that subject has been having leg issues and hasn't been able to get around much lately.  I let her know that the Research Office is trying to follow up on his missed appointments for the [facility] Heart Failure Study.  I also let her know that we would be able to provide transportation for Mr. Simones.  Hart Carwin was able to give another phone number to contact the patient and stated that the 919 area code numbers were old and I would remove those from Mr. Shallenberger contact.  I attempted to call the phone number she provided and there was no answer and no voicemail.  Will continue to attempt to contact subject directly.

## 2016-10-10 ENCOUNTER — Telehealth: Payer: Self-pay

## 2016-10-10 NOTE — Telephone Encounter (Signed)
Patient returning [place] call stating he will be here tomorrow for Dr. Haroldine Laws and echo appt. He will be able to complete Research appt as well. States his "lady friend" has started chemo and he has had to stick to her. He also states his car is repaired. Very pleasant.

## 2016-10-12 ENCOUNTER — Telehealth: Payer: Self-pay

## 2016-10-12 NOTE — Telephone Encounter (Signed)
Call from patient, he was checking the time of his appointment with Dr. Haroldine Laws, and states he will come to Research as well. Patient states he is doing well, he has transportation now, but is dealing with his "lady friend" who has started Chemo. States he will try to make in tomorrow at 11am. Appointment with Dr. Haroldine Laws Sept 6th 12noon.

## 2016-11-16 ENCOUNTER — Ambulatory Visit (HOSPITAL_COMMUNITY)
Admission: RE | Admit: 2016-11-16 | Discharge: 2016-11-16 | Disposition: A | Discharge status: Home / Self Care | Payer: Medicare Other | Source: Ambulatory Visit | Attending: Student | Admitting: Student

## 2016-11-16 ENCOUNTER — Encounter: Payer: Self-pay | Admitting: *Deleted

## 2016-11-16 ENCOUNTER — Ambulatory Visit (HOSPITAL_BASED_OUTPATIENT_CLINIC_OR_DEPARTMENT_OTHER)
Admission: RE | Admit: 2016-11-16 | Discharge: 2016-11-16 | Disposition: A | Discharge status: Home / Self Care | Payer: Medicare Other | Source: Ambulatory Visit | Attending: Internal Medicine | Admitting: Internal Medicine

## 2016-11-16 VITALS — BP 146/92 | HR 111 | Wt 206.8 lb

## 2016-11-16 DIAGNOSIS — Z9119 Patient's noncompliance with other medical treatment and regimen: Secondary | ICD-10-CM | POA: Insufficient documentation

## 2016-11-16 DIAGNOSIS — N183 Chronic kidney disease, stage 3 unspecified: Secondary | ICD-10-CM

## 2016-11-16 DIAGNOSIS — Z79899 Other long term (current) drug therapy: Secondary | ICD-10-CM | POA: Insufficient documentation

## 2016-11-16 DIAGNOSIS — M25552 Pain in left hip: Secondary | ICD-10-CM | POA: Diagnosis not present

## 2016-11-16 DIAGNOSIS — Z8249 Family history of ischemic heart disease and other diseases of the circulatory system: Secondary | ICD-10-CM | POA: Insufficient documentation

## 2016-11-16 DIAGNOSIS — Z833 Family history of diabetes mellitus: Secondary | ICD-10-CM | POA: Diagnosis not present

## 2016-11-16 DIAGNOSIS — I5041 Acute combined systolic (congestive) and diastolic (congestive) heart failure: Secondary | ICD-10-CM | POA: Insufficient documentation

## 2016-11-16 DIAGNOSIS — K219 Gastro-esophageal reflux disease without esophagitis: Secondary | ICD-10-CM | POA: Insufficient documentation

## 2016-11-16 DIAGNOSIS — N179 Acute kidney failure, unspecified: Secondary | ICD-10-CM | POA: Diagnosis not present

## 2016-11-16 DIAGNOSIS — F17210 Nicotine dependence, cigarettes, uncomplicated: Secondary | ICD-10-CM | POA: Insufficient documentation

## 2016-11-16 DIAGNOSIS — F17200 Nicotine dependence, unspecified, uncomplicated: Secondary | ICD-10-CM

## 2016-11-16 DIAGNOSIS — I130 Hypertensive heart and chronic kidney disease with heart failure and stage 1 through stage 4 chronic kidney disease, or unspecified chronic kidney disease: Secondary | ICD-10-CM | POA: Insufficient documentation

## 2016-11-16 DIAGNOSIS — Z7982 Long term (current) use of aspirin: Secondary | ICD-10-CM | POA: Insufficient documentation

## 2016-11-16 DIAGNOSIS — Z806 Family history of leukemia: Secondary | ICD-10-CM | POA: Insufficient documentation

## 2016-11-16 DIAGNOSIS — I5042 Chronic combined systolic (congestive) and diastolic (congestive) heart failure: Secondary | ICD-10-CM | POA: Insufficient documentation

## 2016-11-16 DIAGNOSIS — G8929 Other chronic pain: Secondary | ICD-10-CM | POA: Insufficient documentation

## 2016-11-16 DIAGNOSIS — J449 Chronic obstructive pulmonary disease, unspecified: Secondary | ICD-10-CM | POA: Diagnosis not present

## 2016-11-16 DIAGNOSIS — I429 Cardiomyopathy, unspecified: Secondary | ICD-10-CM | POA: Insufficient documentation

## 2016-11-16 DIAGNOSIS — Z9114 Patient's other noncompliance with medication regimen: Secondary | ICD-10-CM | POA: Diagnosis not present

## 2016-11-16 DIAGNOSIS — R000 Tachycardia, unspecified: Secondary | ICD-10-CM | POA: Insufficient documentation

## 2016-11-16 DIAGNOSIS — I493 Ventricular premature depolarization: Secondary | ICD-10-CM | POA: Insufficient documentation

## 2016-11-16 DIAGNOSIS — Z006 Encounter for examination for normal comparison and control in clinical research program: Secondary | ICD-10-CM

## 2016-11-16 DIAGNOSIS — H409 Unspecified glaucoma: Secondary | ICD-10-CM | POA: Insufficient documentation

## 2016-11-16 DIAGNOSIS — F329 Major depressive disorder, single episode, unspecified: Secondary | ICD-10-CM | POA: Diagnosis not present

## 2016-11-16 DIAGNOSIS — R079 Chest pain, unspecified: Secondary | ICD-10-CM | POA: Diagnosis not present

## 2016-11-16 DIAGNOSIS — I5022 Chronic systolic (congestive) heart failure: Secondary | ICD-10-CM

## 2016-11-16 LAB — BASIC METABOLIC PANEL
ANION GAP: 11 (ref 5–15)
BUN: 16 mg/dL (ref 6–20)
CALCIUM: 9.6 mg/dL (ref 8.9–10.3)
CO2: 22 mmol/L (ref 22–32)
CREATININE: 1.14 mg/dL (ref 0.61–1.24)
Chloride: 107 mmol/L (ref 101–111)
GLUCOSE: 92 mg/dL (ref 65–99)
Potassium: 4.4 mmol/L (ref 3.5–5.1)
Sodium: 140 mmol/L (ref 135–145)

## 2016-11-16 LAB — BRAIN NATRIURETIC PEPTIDE: B Natriuretic Peptide: 204.4 pg/mL — ABNORMAL HIGH (ref 0.0–100.0)

## 2016-11-16 MED ORDER — PERFLUTREN LIPID MICROSPHERE
1.0000 mL | INTRAVENOUS | Status: AC | PRN
  Administered 2016-11-16: 2 mL via INTRAVENOUS
  Filled 2016-11-16: qty 10
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
## 2016-11-16 NOTE — Progress Notes (Signed)
  Echocardiogram 2D Echocardiogram has been performed.  Ryan [name] 11/16/2016, 12:32 PM

## 2016-11-16 NOTE — Patient Instructions (Signed)
Routine lab work today. Will notify you of abnormal results, otherwise no news is good news!  Follow up 4 weeks with Amy Clegg NP-C. Take all medication as prescribed the day of your appointment. Bring all medications with you to your appointment.  Do the following things EVERYDAY: 1) Weigh yourself in the morning before breakfast. Write it down and keep it in a log. 2) Take your medicines as prescribed 3) Eat low salt foods-Limit salt (sodium) to 2000 mg per day.  4) Stay as active as you can everyday 5) Limit all fluids for the day to less than 2 liters

## 2016-11-16 NOTE — Progress Notes (Signed)
RESEARCH ENCOUNTER  Patient ID: Ryan [name]  DOB: January 09, 1951  [name] presented to the [facility] for an Unscheduled Visit of the [facility].  No signs/symptoms of ACS since the last visit.  IP returned, additional IP dispensed, labs collected and EKG obtained.    Subject states that noncompliance with the study visits are because his girlfriend has been ill and receiving chemo.  In addition, he had issues with his car and his physical mobility related to hip pain.  Both study coordinators discussed the subject the importance of attending Research appointments.  Options were provided for follow-up including in person appointments and phone.  Furthermore, transportation was offered again and the subject was very firm on driving his own car to his appointments since he has "put so much money into fixing it."  Subject voiced that he wants to stay in the research study and his girlfriend has two more chemo sessions and then his schedule will have more availability.  Education provided on the value of follow-up and IP compliance.    Blood pressure 134/80, pulse (!) 102, weight 206 lb (93.4 kg).  Patient will follow up with [facility] for Week 24 visit on October 23rd at 10:00.

## 2016-12-14 ENCOUNTER — Inpatient Hospital Stay (HOSPITAL_COMMUNITY): Admission: RE | Admit: 2016-12-14 | Payer: Medicare Other | Source: Ambulatory Visit

## 2016-12-17 DIAGNOSIS — I110 Hypertensive heart disease with heart failure: Secondary | ICD-10-CM | POA: Diagnosis not present

## 2016-12-17 DIAGNOSIS — F17200 Nicotine dependence, unspecified, uncomplicated: Secondary | ICD-10-CM | POA: Diagnosis not present

## 2016-12-17 DIAGNOSIS — Z7982 Long term (current) use of aspirin: Secondary | ICD-10-CM | POA: Diagnosis not present

## 2016-12-17 DIAGNOSIS — M25551 Pain in right hip: Secondary | ICD-10-CM | POA: Diagnosis not present

## 2016-12-17 DIAGNOSIS — M5431 Sciatica, right side: Secondary | ICD-10-CM | POA: Diagnosis not present

## 2016-12-17 DIAGNOSIS — I509 Heart failure, unspecified: Secondary | ICD-10-CM | POA: Diagnosis not present

## 2016-12-17 DIAGNOSIS — E7800 Pure hypercholesterolemia, unspecified: Secondary | ICD-10-CM | POA: Diagnosis not present

## 2016-12-17 DIAGNOSIS — Z79899 Other long term (current) drug therapy: Secondary | ICD-10-CM | POA: Diagnosis not present

## 2017-01-04 ENCOUNTER — Encounter: Payer: Self-pay | Admitting: *Deleted

## 2017-01-04 ENCOUNTER — Encounter (HOSPITAL_COMMUNITY): Payer: Self-pay | Admitting: *Deleted

## 2017-01-04 ENCOUNTER — Emergency Department (HOSPITAL_COMMUNITY)
Admission: EM | Admit: 2017-01-04 | Discharge: 2017-01-04 | Disposition: A | Discharge status: Home / Self Care | Payer: Medicare Other | Attending: Emergency Medicine | Admitting: Emergency Medicine

## 2017-01-04 DIAGNOSIS — N183 Chronic kidney disease, stage 3 (moderate): Secondary | ICD-10-CM | POA: Insufficient documentation

## 2017-01-04 DIAGNOSIS — I5042 Chronic combined systolic (congestive) and diastolic (congestive) heart failure: Secondary | ICD-10-CM | POA: Diagnosis not present

## 2017-01-04 DIAGNOSIS — M25551 Pain in right hip: Secondary | ICD-10-CM | POA: Diagnosis present

## 2017-01-04 DIAGNOSIS — F17210 Nicotine dependence, cigarettes, uncomplicated: Secondary | ICD-10-CM | POA: Insufficient documentation

## 2017-01-04 DIAGNOSIS — Z7982 Long term (current) use of aspirin: Secondary | ICD-10-CM | POA: Diagnosis not present

## 2017-01-04 DIAGNOSIS — I130 Hypertensive heart and chronic kidney disease with heart failure and stage 1 through stage 4 chronic kidney disease, or unspecified chronic kidney disease: Secondary | ICD-10-CM | POA: Insufficient documentation

## 2017-01-04 DIAGNOSIS — Z006 Encounter for examination for normal comparison and control in clinical research program: Secondary | ICD-10-CM

## 2017-01-04 DIAGNOSIS — M5441 Lumbago with sciatica, right side: Secondary | ICD-10-CM | POA: Diagnosis not present

## 2017-01-04 DIAGNOSIS — M5431 Sciatica, right side: Secondary | ICD-10-CM | POA: Diagnosis not present

## 2017-01-04 DIAGNOSIS — Z79899 Other long term (current) drug therapy: Secondary | ICD-10-CM | POA: Diagnosis not present

## 2017-01-04 MED ORDER — LIDOCAINE 5 % EX PTCH: 1.0000 | MEDICATED_PATCH | CUTANEOUS | 0 refills | Status: DC

## 2017-01-04 MED ORDER — PREDNISONE 10 MG (21) PO TBPK: ORAL_TABLET | ORAL | 0 refills | Status: DC

## 2017-01-04 MED ORDER — METHYLPREDNISOLONE SODIUM SUCC 125 MG IJ SOLR
80.0000 mg | Freq: Once | INTRAMUSCULAR | Status: AC
  Administered 2017-01-04: 80 mg via INTRAMUSCULAR
  Filled 2017-01-04: qty 2

## 2017-01-04 MED FILL — predniSONE 10 MG TABS: 10 | 6 days supply | Qty: 21 | Fill #0
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
## 2017-01-04 NOTE — Discharge Instructions (Signed)
Your symptoms are consistent with sciatica. Take it easy, but do not lay around too much as this may make any stiffness worse.  Pain: Continue to use your already prescribed pain medication, as directed. Lidocaine patches: These are available via either prescription or over-the-counter. The over-the-counter option may be more economical one and are likely just as effective. There are multiple over-the-counter brands, such as Salonpas. Exercises: Be sure to perform the attached exercises starting with three times a week and working up to performing them daily. This is an essential part of preventing long term problems.   Follow up with a primary care provider or orthopedic specialist for any future management of these complaints.  To make an appointment with the orthopedic specialist, call the number above.
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
## 2017-01-04 NOTE — ED Triage Notes (Signed)
To ED for eval of right leg 'giving out' over the past few months. Pt states he was in CHF clinic this am and come to ED after. Walks with a cane. Appears in nad.

## 2017-01-08 ENCOUNTER — Ambulatory Visit (HOSPITAL_COMMUNITY)
Admission: RE | Admit: 2017-01-08 | Discharge: 2017-01-08 | Disposition: A | Discharge status: Home / Self Care | Payer: Medicare Other | Source: Ambulatory Visit | Attending: Cardiology | Admitting: Cardiology

## 2017-01-08 ENCOUNTER — Encounter (HOSPITAL_COMMUNITY): Payer: Self-pay

## 2017-01-08 ENCOUNTER — Encounter (HOSPITAL_COMMUNITY): Payer: Self-pay | Admitting: *Deleted

## 2017-01-08 ENCOUNTER — Other Ambulatory Visit (HOSPITAL_COMMUNITY): Payer: Self-pay | Admitting: *Deleted

## 2017-01-08 VITALS — BP 124/76 | HR 118 | Wt 206.8 lb

## 2017-01-08 DIAGNOSIS — H409 Unspecified glaucoma: Secondary | ICD-10-CM | POA: Insufficient documentation

## 2017-01-08 DIAGNOSIS — I5022 Chronic systolic (congestive) heart failure: Secondary | ICD-10-CM

## 2017-01-08 DIAGNOSIS — K219 Gastro-esophageal reflux disease without esophagitis: Secondary | ICD-10-CM | POA: Diagnosis not present

## 2017-01-08 DIAGNOSIS — R000 Tachycardia, unspecified: Secondary | ICD-10-CM | POA: Insufficient documentation

## 2017-01-08 DIAGNOSIS — F17210 Nicotine dependence, cigarettes, uncomplicated: Secondary | ICD-10-CM | POA: Insufficient documentation

## 2017-01-08 DIAGNOSIS — Z9119 Patient's noncompliance with other medical treatment and regimen: Secondary | ICD-10-CM | POA: Diagnosis not present

## 2017-01-08 DIAGNOSIS — I429 Cardiomyopathy, unspecified: Secondary | ICD-10-CM | POA: Diagnosis not present

## 2017-01-08 DIAGNOSIS — F1010 Alcohol abuse, uncomplicated: Secondary | ICD-10-CM | POA: Insufficient documentation

## 2017-01-08 DIAGNOSIS — I130 Hypertensive heart and chronic kidney disease with heart failure and stage 1 through stage 4 chronic kidney disease, or unspecified chronic kidney disease: Secondary | ICD-10-CM | POA: Insufficient documentation

## 2017-01-08 DIAGNOSIS — N183 Chronic kidney disease, stage 3 (moderate): Secondary | ICD-10-CM | POA: Insufficient documentation

## 2017-01-08 DIAGNOSIS — R079 Chest pain, unspecified: Secondary | ICD-10-CM | POA: Diagnosis not present

## 2017-01-08 DIAGNOSIS — I5042 Chronic combined systolic (congestive) and diastolic (congestive) heart failure: Secondary | ICD-10-CM | POA: Insufficient documentation

## 2017-01-08 DIAGNOSIS — G8929 Other chronic pain: Secondary | ICD-10-CM | POA: Insufficient documentation

## 2017-01-08 DIAGNOSIS — Z79899 Other long term (current) drug therapy: Secondary | ICD-10-CM | POA: Diagnosis not present

## 2017-01-08 DIAGNOSIS — N179 Acute kidney failure, unspecified: Secondary | ICD-10-CM | POA: Diagnosis not present

## 2017-01-08 DIAGNOSIS — Z7982 Long term (current) use of aspirin: Secondary | ICD-10-CM | POA: Insufficient documentation

## 2017-01-08 DIAGNOSIS — F329 Major depressive disorder, single episode, unspecified: Secondary | ICD-10-CM | POA: Insufficient documentation

## 2017-01-08 MED ORDER — DIGOXIN 125 MCG PO TABS
0.1250 mg | ORAL_TABLET | Freq: Every day | ORAL | 3 refills | Status: DC
Start: 2017-01-08 — End: 2018-01-16

## 2017-01-08 MED ORDER — HYDRALAZINE HCL 50 MG PO TABS: 50.0000 mg | ORAL_TABLET | Freq: Three times a day (TID) | ORAL | 3 refills | Status: DC

## 2017-01-08 MED ORDER — FUROSEMIDE 20 MG PO TABS: 40.0000 mg | ORAL_TABLET | Freq: Every day | ORAL | 2 refills | Status: DC

## 2017-01-08 MED ORDER — ISOSORBIDE MONONITRATE ER 30 MG PO TB24: 30.0000 mg | ORAL_TABLET | Freq: Every day | ORAL | 3 refills | Status: DC

## 2017-01-08 MED ORDER — SPIRONOLACTONE 25 MG PO TABS: 25.0000 mg | ORAL_TABLET | Freq: Every day | ORAL | 2 refills | Status: DC

## 2017-01-08 MED FILL — DIGOXIN 0.125 MG TABLET: 125 | 34 days supply | Qty: 34 | Fill #0

## 2017-01-08 MED FILL — FUROSEMIDE 40 MG TAB: 40 | 100 days supply | Qty: 100 | Fill #0

## 2017-01-08 MED FILL — hydrALAZINE HCL 50 MG TABS: 50 | 34 days supply | Qty: 102 | Fill #0

## 2017-01-08 MED FILL — ISOSORBIDE MN ER 30 MG TAB: 30 | 34 days supply | Qty: 34 | Fill #0

## 2017-01-08 MED FILL — SPIRONOLACTONE 25 MG TABLET: 25 | 34 days supply | Qty: 34 | Fill #0

## 2017-01-08 NOTE — Patient Instructions (Signed)
START taking digoxin 0.125 mg (1 Tablet) Once Daily.  You have been referred to see EP, they will contact you to schedule initial appointment.   You are scheduled for a Right Heart Catheterization, please see attached instruction sheet.  Follow up in 1 Month.
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
## 2017-01-09 ENCOUNTER — Other Ambulatory Visit (HOSPITAL_COMMUNITY): Payer: Self-pay | Admitting: *Deleted

## 2017-01-12 ENCOUNTER — Encounter (HOSPITAL_COMMUNITY)
Admission: RE | Disposition: A | Discharge status: Home / Self Care | Payer: Self-pay | Source: Ambulatory Visit | Attending: Internal Medicine

## 2017-01-12 ENCOUNTER — Other Ambulatory Visit (HOSPITAL_COMMUNITY): Payer: Self-pay

## 2017-01-12 ENCOUNTER — Ambulatory Visit (HOSPITAL_COMMUNITY)
Admission: RE | Admit: 2017-01-12 | Discharge: 2017-01-12 | Disposition: A | Discharge status: Home / Self Care | Payer: Medicare Other | Source: Ambulatory Visit | Attending: Internal Medicine | Admitting: Internal Medicine

## 2017-01-12 DIAGNOSIS — H409 Unspecified glaucoma: Secondary | ICD-10-CM | POA: Diagnosis not present

## 2017-01-12 DIAGNOSIS — G8929 Other chronic pain: Secondary | ICD-10-CM | POA: Diagnosis not present

## 2017-01-12 DIAGNOSIS — Z7982 Long term (current) use of aspirin: Secondary | ICD-10-CM | POA: Insufficient documentation

## 2017-01-12 DIAGNOSIS — F329 Major depressive disorder, single episode, unspecified: Secondary | ICD-10-CM | POA: Insufficient documentation

## 2017-01-12 DIAGNOSIS — I5022 Chronic systolic (congestive) heart failure: Secondary | ICD-10-CM | POA: Diagnosis not present

## 2017-01-12 DIAGNOSIS — F17210 Nicotine dependence, cigarettes, uncomplicated: Secondary | ICD-10-CM | POA: Insufficient documentation

## 2017-01-12 DIAGNOSIS — I130 Hypertensive heart and chronic kidney disease with heart failure and stage 1 through stage 4 chronic kidney disease, or unspecified chronic kidney disease: Secondary | ICD-10-CM | POA: Diagnosis present

## 2017-01-12 DIAGNOSIS — Z8249 Family history of ischemic heart disease and other diseases of the circulatory system: Secondary | ICD-10-CM | POA: Insufficient documentation

## 2017-01-12 DIAGNOSIS — K219 Gastro-esophageal reflux disease without esophagitis: Secondary | ICD-10-CM | POA: Diagnosis not present

## 2017-01-12 DIAGNOSIS — R079 Chest pain, unspecified: Secondary | ICD-10-CM | POA: Diagnosis not present

## 2017-01-12 DIAGNOSIS — N182 Chronic kidney disease, stage 2 (mild): Secondary | ICD-10-CM | POA: Insufficient documentation

## 2017-01-12 DIAGNOSIS — I5042 Chronic combined systolic (congestive) and diastolic (congestive) heart failure: Secondary | ICD-10-CM | POA: Insufficient documentation

## 2017-01-12 DIAGNOSIS — I272 Pulmonary hypertension, unspecified: Secondary | ICD-10-CM | POA: Diagnosis not present

## 2017-01-12 DIAGNOSIS — F1010 Alcohol abuse, uncomplicated: Secondary | ICD-10-CM | POA: Diagnosis not present

## 2017-01-12 DIAGNOSIS — I428 Other cardiomyopathies: Secondary | ICD-10-CM | POA: Insufficient documentation

## 2017-01-12 DIAGNOSIS — M549 Dorsalgia, unspecified: Secondary | ICD-10-CM | POA: Diagnosis not present

## 2017-01-12 HISTORY — PX: RIGHT/LEFT HEART CATH AND CORONARY ANGIOGRAPHY: CATH118266

## 2017-01-12 LAB — POCT I-STAT 3, ART BLOOD GAS (G3+)
Acid-base deficit: 1 mmol/L (ref 0.0–2.0)
BICARBONATE: 23 mmol/L (ref 20.0–28.0)
O2 SAT: 98 %
PCO2 ART: 34.6 mmHg (ref 32.0–48.0)
TCO2: 24 mmol/L (ref 22–32)
pH, Arterial: 7.432 (ref 7.350–7.450)
pO2, Arterial: 106 mmHg (ref 83.0–108.0)

## 2017-01-12 LAB — POCT I-STAT 3, VENOUS BLOOD GAS (G3P V)
ACID-BASE DEFICIT: 3 mmol/L — AB (ref 0.0–2.0)
BICARBONATE: 21.5 mmol/L (ref 20.0–28.0)
BICARBONATE: 25 mmol/L (ref 20.0–28.0)
O2 SAT: 68 %
O2 SAT: 69 %
PCO2 VEN: 37.5 mmHg — AB (ref 44.0–60.0)
PO2 VEN: 36 mmHg (ref 32.0–45.0)
PO2 VEN: 37 mmHg (ref 32.0–45.0)
TCO2: 23 mmol/L (ref 22–32)
TCO2: 26 mmol/L (ref 22–32)
pCO2, Ven: 41.9 mmHg — ABNORMAL LOW (ref 44.0–60.0)
pH, Ven: 7.368 (ref 7.250–7.430)
pH, Ven: 7.383 (ref 7.250–7.430)

## 2017-01-12 LAB — CBC
HEMATOCRIT: 34.3 % — AB (ref 39.0–52.0)
HEMOGLOBIN: 11.2 g/dL — AB (ref 13.0–17.0)
MCH: 31.4 pg (ref 26.0–34.0)
MCHC: 32.7 g/dL (ref 30.0–36.0)
MCV: 96.1 fL (ref 78.0–100.0)
Platelets: 139 10*3/uL — ABNORMAL LOW (ref 150–400)
RBC: 3.57 MIL/uL — ABNORMAL LOW (ref 4.22–5.81)
RDW: 14.8 % (ref 11.5–15.5)
WBC: 9.1 10*3/uL (ref 4.0–10.5)

## 2017-01-12 LAB — BASIC METABOLIC PANEL
Anion gap: 7 (ref 5–15)
BUN: 18 mg/dL (ref 6–20)
CHLORIDE: 106 mmol/L (ref 101–111)
CO2: 25 mmol/L (ref 22–32)
Calcium: 8.9 mg/dL (ref 8.9–10.3)
Creatinine, Ser: 1.47 mg/dL — ABNORMAL HIGH (ref 0.61–1.24)
GFR calc Af Amer: 55 mL/min — ABNORMAL LOW (ref >60)
GFR calc non Af Amer: 48 mL/min — ABNORMAL LOW (ref >60)
GLUCOSE: 100 mg/dL — AB (ref 65–99)
POTASSIUM: 3.9 mmol/L (ref 3.5–5.1)
Sodium: 138 mmol/L (ref 135–145)

## 2017-01-12 LAB — PROTIME-INR
INR: 0.97
Prothrombin Time: 12.8 seconds (ref 11.4–15.2)

## 2017-01-12 SURGERY — RIGHT/LEFT HEART CATH AND CORONARY ANGIOGRAPHY
Anesthesia: LOCAL

## 2017-01-12 MED ORDER — VERAPAMIL HCL 2.5 MG/ML IV SOLN
INTRAVENOUS | Status: AC
  Filled 2017-01-12: qty 2

## 2017-01-12 MED ORDER — SODIUM CHLORIDE 0.9 % IV SOLN: 250.0000 mL | INTRAVENOUS | Status: DC | PRN

## 2017-01-12 MED ORDER — ASPIRIN 81 MG PO CHEW: 81.0000 mg | CHEWABLE_TABLET | ORAL | Status: DC

## 2017-01-12 MED ORDER — HEPARIN (PORCINE) IN NACL 2-0.9 UNIT/ML-% IJ SOLN
INTRAMUSCULAR | Status: AC | PRN
  Administered 2017-01-12: 1000 mL

## 2017-01-12 MED ORDER — SODIUM CHLORIDE 0.9 % IV SOLN: INTRAVENOUS | Status: AC

## 2017-01-12 MED ORDER — ACETAMINOPHEN 325 MG PO TABS: 650.0000 mg | ORAL_TABLET | ORAL | Status: DC | PRN

## 2017-01-12 MED ORDER — HEPARIN SODIUM (PORCINE) 1000 UNIT/ML IJ SOLN
INTRAMUSCULAR | Status: DC | PRN
  Administered 2017-01-12: 4500 [IU] via INTRAVENOUS

## 2017-01-12 MED ORDER — FENTANYL CITRATE (PF) 100 MCG/2ML IJ SOLN
INTRAMUSCULAR | Status: AC
  Filled 2017-01-12: qty 2

## 2017-01-12 MED ORDER — SODIUM CHLORIDE 0.9% FLUSH: 3.0000 mL | Freq: Two times a day (BID) | INTRAVENOUS | Status: DC

## 2017-01-12 MED ORDER — LIDOCAINE HCL (PF) 1 % IJ SOLN
INTRAMUSCULAR | Status: AC
  Filled 2017-01-12: qty 30

## 2017-01-12 MED ORDER — LIDOCAINE HCL (PF) 1 % IJ SOLN
INTRAMUSCULAR | Status: DC | PRN
  Administered 2017-01-12: 2 mL

## 2017-01-12 MED ORDER — IOPAMIDOL (ISOVUE-370) INJECTION 76%
INTRAVENOUS | Status: AC
  Filled 2017-01-12: qty 100

## 2017-01-12 MED ORDER — IOPAMIDOL (ISOVUE-370) INJECTION 76%
INTRAVENOUS | Status: DC | PRN
  Administered 2017-01-12: 75 mL via INTRAVENOUS

## 2017-01-12 MED ORDER — FENTANYL CITRATE (PF) 100 MCG/2ML IJ SOLN
INTRAMUSCULAR | Status: DC | PRN
  Administered 2017-01-12: 25 ug via INTRAVENOUS

## 2017-01-12 MED ORDER — SODIUM CHLORIDE 0.9% FLUSH: 3.0000 mL | INTRAVENOUS | Status: DC | PRN

## 2017-01-12 MED ORDER — ONDANSETRON HCL 4 MG/2ML IJ SOLN: 4.0000 mg | Freq: Four times a day (QID) | INTRAMUSCULAR | Status: DC | PRN

## 2017-01-12 MED ORDER — MIDAZOLAM HCL 2 MG/2ML IJ SOLN
INTRAMUSCULAR | Status: AC
  Filled 2017-01-12: qty 2

## 2017-01-12 MED ORDER — MIDAZOLAM HCL 2 MG/2ML IJ SOLN
INTRAMUSCULAR | Status: DC | PRN
  Administered 2017-01-12: 2 mg via INTRAVENOUS

## 2017-01-12 MED ORDER — VERAPAMIL HCL 2.5 MG/ML IV SOLN
INTRAVENOUS | Status: DC | PRN
  Administered 2017-01-12: 10 mL via INTRA_ARTERIAL

## 2017-01-12 MED ORDER — SODIUM CHLORIDE 0.9 % IV SOLN
INTRAVENOUS | Status: DC
  Administered 2017-01-12: 11:00:00 via INTRAVENOUS

## 2017-01-12 MED ORDER — HEPARIN SODIUM (PORCINE) 1000 UNIT/ML IJ SOLN
INTRAMUSCULAR | Status: AC
  Filled 2017-01-12: qty 1

## 2017-01-12 SURGICAL SUPPLY — 12 items
CATH 5FR JL3.5 JR4 ANG PIG MP (CATHETERS) ×1 IMPLANT
CATH BALLN WEDGE 5F 110CM (CATHETERS) ×1 IMPLANT
DEVICE RAD COMP TR BAND LRG (VASCULAR PRODUCTS) ×1 IMPLANT
GLIDESHEATH SLEND SS 6F .021 (SHEATH) ×1 IMPLANT
GUIDEWIRE INQWIRE 1.5J.035X260 (WIRE) IMPLANT
INQWIRE 1.5J .035X260CM (WIRE) ×2
KIT HEART LEFT (KITS) ×2 IMPLANT
PACK CARDIAC CATHETERIZATION (CUSTOM PROCEDURE TRAY) ×2 IMPLANT
SHEATH GLIDE SLENDER 4/5FR (SHEATH) ×1 IMPLANT
SYR MEDRAD MARK V 150ML (SYRINGE) ×2 IMPLANT
TRANSDUCER W/STOPCOCK (MISCELLANEOUS) ×2 IMPLANT
TUBING CIL FLEX 10 FLL-RA (TUBING) ×2 IMPLANT

## 2017-01-12 NOTE — Discharge Instructions (Signed)

## 2017-01-12 NOTE — Research (Signed)
RESEARCH ENCOUNTER  Patient ID: UNDREA SHIPES  DOB: 1950/10/19  Spoke with Mr. Buddenhagen about his overall health.  He states that he has increased alcohol consumption because of pain, especially in his leg.  He has a few OTC pain creams that he has been using, but they have not been working.  Mr. Womac presented for cardiac cath today.  He brought his study medication and will give to bedside nurse after the procedure.

## 2017-01-12 NOTE — Interval H&P Note (Signed)
History and Physical Interval Note:  01/12/2017 1:09 PM  Ryan [name]  has presented today for surgery, with the diagnosis of chf  The various methods of treatment have been discussed with the patient and family. After consideration of risks, benefits and other options for treatment, the patient has consented to  Procedure(s): RIGHT/LEFT HEART CATH AND CORONARY ANGIOGRAPHY (N/A) and possible coronary angioplasty as a surgical intervention .  The patient's history has been reviewed, patient examined, no change in status, stable for surgery.  I have reviewed the patient's chart and labs.  Questions were answered to the patient's satisfaction.     Bensimhon, Quillian Quince
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
## 2017-01-15 ENCOUNTER — Encounter (HOSPITAL_COMMUNITY): Payer: Self-pay | Admitting: Internal Medicine

## 2017-02-08 ENCOUNTER — Encounter (HOSPITAL_COMMUNITY): Payer: Medicare Other

## 2017-02-14 ENCOUNTER — Telehealth (HOSPITAL_COMMUNITY): Payer: Self-pay

## 2017-02-14 NOTE — Telephone Encounter (Signed)
CHF Clinic appointment reminder call placed to patient for upcoming post-hospital follow up.  LVMTCB to confirm apt.  Patient also reminded to take all medications as prescribed on the day of his/her appointment and to bring all medications to this appointment.  Advised to call our office for tardiness or cancellations/rescheduling needs.  .Camryn Quesinberry Genevea  

## 2017-02-15 ENCOUNTER — Encounter (HOSPITAL_COMMUNITY): Payer: Medicare Other

## 2017-03-01 ENCOUNTER — Inpatient Hospital Stay (HOSPITAL_COMMUNITY): Payer: Medicare Other

## 2017-03-01 ENCOUNTER — Other Ambulatory Visit: Payer: Self-pay

## 2017-03-01 ENCOUNTER — Encounter (HOSPITAL_COMMUNITY): Payer: Self-pay

## 2017-03-01 ENCOUNTER — Ambulatory Visit (HOSPITAL_COMMUNITY)
Admission: RE | Admit: 2017-03-01 | Discharge: 2017-03-01 | Disposition: A | Discharge status: Home / Self Care | Payer: Medicare Other | Source: Ambulatory Visit | Attending: Cardiology | Admitting: Cardiology

## 2017-03-01 ENCOUNTER — Inpatient Hospital Stay (HOSPITAL_COMMUNITY)
Admission: AD | Admit: 2017-03-01 | Discharge: 2017-03-03 | DRG: 291 | Disposition: A | Discharge status: Home / Self Care | Payer: Medicare Other | Source: Ambulatory Visit | Attending: Internal Medicine | Admitting: Internal Medicine

## 2017-03-01 VITALS — BP 144/72 | HR 103 | Wt 230.0 lb

## 2017-03-01 DIAGNOSIS — I5043 Acute on chronic combined systolic (congestive) and diastolic (congestive) heart failure: Secondary | ICD-10-CM | POA: Diagnosis present

## 2017-03-01 DIAGNOSIS — F17210 Nicotine dependence, cigarettes, uncomplicated: Secondary | ICD-10-CM | POA: Diagnosis present

## 2017-03-01 DIAGNOSIS — I130 Hypertensive heart and chronic kidney disease with heart failure and stage 1 through stage 4 chronic kidney disease, or unspecified chronic kidney disease: Secondary | ICD-10-CM | POA: Diagnosis present

## 2017-03-01 DIAGNOSIS — I428 Other cardiomyopathies: Secondary | ICD-10-CM | POA: Diagnosis present

## 2017-03-01 DIAGNOSIS — I10 Essential (primary) hypertension: Secondary | ICD-10-CM

## 2017-03-01 DIAGNOSIS — H409 Unspecified glaucoma: Secondary | ICD-10-CM | POA: Diagnosis present

## 2017-03-01 DIAGNOSIS — N183 Chronic kidney disease, stage 3 unspecified: Secondary | ICD-10-CM

## 2017-03-01 DIAGNOSIS — I5022 Chronic systolic (congestive) heart failure: Secondary | ICD-10-CM

## 2017-03-01 DIAGNOSIS — K219 Gastro-esophageal reflux disease without esophagitis: Secondary | ICD-10-CM | POA: Diagnosis present

## 2017-03-01 DIAGNOSIS — Z7982 Long term (current) use of aspirin: Secondary | ICD-10-CM

## 2017-03-01 DIAGNOSIS — R0602 Shortness of breath: Secondary | ICD-10-CM | POA: Diagnosis not present

## 2017-03-01 DIAGNOSIS — Z9114 Patient's other noncompliance with medication regimen: Secondary | ICD-10-CM

## 2017-03-01 DIAGNOSIS — F1010 Alcohol abuse, uncomplicated: Secondary | ICD-10-CM | POA: Diagnosis present

## 2017-03-01 DIAGNOSIS — F329 Major depressive disorder, single episode, unspecified: Secondary | ICD-10-CM | POA: Diagnosis present

## 2017-03-01 DIAGNOSIS — I5023 Acute on chronic systolic (congestive) heart failure: Secondary | ICD-10-CM | POA: Diagnosis present

## 2017-03-01 DIAGNOSIS — Z9111 Patient's noncompliance with dietary regimen: Secondary | ICD-10-CM | POA: Diagnosis not present

## 2017-03-01 DIAGNOSIS — R0600 Dyspnea, unspecified: Secondary | ICD-10-CM

## 2017-03-01 DIAGNOSIS — I5041 Acute combined systolic (congestive) and diastolic (congestive) heart failure: Secondary | ICD-10-CM

## 2017-03-01 DIAGNOSIS — F1011 Alcohol abuse, in remission: Secondary | ICD-10-CM

## 2017-03-01 DIAGNOSIS — Z79899 Other long term (current) drug therapy: Secondary | ICD-10-CM

## 2017-03-01 DIAGNOSIS — I272 Pulmonary hypertension, unspecified: Secondary | ICD-10-CM | POA: Diagnosis present

## 2017-03-01 LAB — COMPREHENSIVE METABOLIC PANEL
ALK PHOS: 65 U/L (ref 38–126)
ALT: 20 U/L (ref 17–63)
ANION GAP: 9 (ref 5–15)
AST: 38 U/L (ref 15–41)
Albumin: 3.7 g/dL (ref 3.5–5.0)
BUN: 10 mg/dL (ref 6–20)
CALCIUM: 8.9 mg/dL (ref 8.9–10.3)
CO2: 19 mmol/L — AB (ref 22–32)
Chloride: 109 mmol/L (ref 101–111)
Creatinine, Ser: 1.2 mg/dL (ref 0.61–1.24)
GFR calc non Af Amer: >60 mL/min (ref >60)
Glucose, Bld: 100 mg/dL — ABNORMAL HIGH (ref 65–99)
Potassium: 4.1 mmol/L (ref 3.5–5.1)
SODIUM: 137 mmol/L (ref 135–145)
Total Bilirubin: 0.8 mg/dL (ref 0.3–1.2)
Total Protein: 6.7 g/dL (ref 6.5–8.1)

## 2017-03-01 LAB — CBC
HCT: 38.1 % — ABNORMAL LOW (ref 39.0–52.0)
Hemoglobin: 12.7 g/dL — ABNORMAL LOW (ref 13.0–17.0)
MCH: 32.1 pg (ref 26.0–34.0)
MCHC: 33.3 g/dL (ref 30.0–36.0)
MCV: 96.2 fL (ref 78.0–100.0)
Platelets: 224 10*3/uL (ref 150–400)
RBC: 3.96 MIL/uL — AB (ref 4.22–5.81)
RDW: 14.6 % (ref 11.5–15.5)
WBC: 5.8 10*3/uL (ref 4.0–10.5)

## 2017-03-01 LAB — MAGNESIUM: MAGNESIUM: 1.9 mg/dL (ref 1.7–2.4)

## 2017-03-01 LAB — DIGOXIN LEVEL

## 2017-03-01 LAB — TSH: TSH: 1.404 u[IU]/mL (ref 0.350–4.500)

## 2017-03-01 LAB — BRAIN NATRIURETIC PEPTIDE: B NATRIURETIC PEPTIDE 5: 173.1 pg/mL — AB (ref 0.0–100.0)

## 2017-03-01 MED ORDER — ACETAMINOPHEN 325 MG PO TABS: 650.0000 mg | ORAL_TABLET | Freq: Four times a day (QID) | ORAL | Status: DC | PRN

## 2017-03-01 MED ORDER — HYDRALAZINE HCL 50 MG PO TABS
50.0000 mg | ORAL_TABLET | Freq: Three times a day (TID) | ORAL | Status: DC
  Administered 2017-03-01 – 2017-03-03 (×7): 50 mg via ORAL
  Filled 2017-03-01 (×7): qty 1

## 2017-03-01 MED ORDER — ACETAMINOPHEN 325 MG PO TABS: 650.0000 mg | ORAL_TABLET | ORAL | Status: DC | PRN

## 2017-03-01 MED ORDER — FUROSEMIDE 10 MG/ML IJ SOLN
80.0000 mg | Freq: Two times a day (BID) | INTRAMUSCULAR | Status: DC
  Administered 2017-03-01 – 2017-03-03 (×5): 80 mg via INTRAVENOUS
  Filled 2017-03-01 (×6): qty 8

## 2017-03-01 MED ORDER — GABAPENTIN 300 MG PO CAPS
300.0000 mg | ORAL_CAPSULE | Freq: Three times a day (TID) | ORAL | Status: DC
  Administered 2017-03-01 – 2017-03-03 (×7): 300 mg via ORAL
  Filled 2017-03-01 (×7): qty 1

## 2017-03-01 MED ORDER — SODIUM CHLORIDE 0.9 % IV SOLN: 250.0000 mL | INTRAVENOUS | Status: DC | PRN

## 2017-03-01 MED ORDER — LORAZEPAM 1 MG PO TABS
1.0000 mg | ORAL_TABLET | Freq: Three times a day (TID) | ORAL | Status: DC | PRN
Start: 2017-03-01 — End: 2017-03-03

## 2017-03-01 MED ORDER — PANTOPRAZOLE SODIUM 40 MG PO TBEC
40.0000 mg | DELAYED_RELEASE_TABLET | Freq: Every day | ORAL | Status: DC
  Administered 2017-03-01 – 2017-03-03 (×3): 40 mg via ORAL
  Filled 2017-03-01 (×3): qty 1

## 2017-03-01 MED ORDER — ENOXAPARIN SODIUM 40 MG/0.4ML ~~LOC~~ SOLN
40.0000 mg | Freq: Every day | SUBCUTANEOUS | Status: DC
  Filled 2017-03-01 (×2): qty 0.4

## 2017-03-01 MED ORDER — DICLOFENAC SODIUM 75 MG PO TBEC
75.0000 mg | DELAYED_RELEASE_TABLET | Freq: Two times a day (BID) | ORAL | Status: DC | PRN
  Administered 2017-03-01 – 2017-03-02 (×2): 75 mg via ORAL
  Filled 2017-03-01 (×3): qty 1

## 2017-03-01 MED ORDER — SODIUM CHLORIDE 0.9% FLUSH: 3.0000 mL | INTRAVENOUS | Status: DC | PRN

## 2017-03-01 MED ORDER — STUDY - GALACTIC STUDY - PLACEBO / OMECAMTIV MECARBIL (PI-MCLEAN)
1.0000 | Freq: Two times a day (BID) | Status: DC
  Administered 2017-03-01 – 2017-03-03 (×5): 1 via ORAL
  Filled 2017-03-01 (×7): qty 1

## 2017-03-01 MED ORDER — SODIUM CHLORIDE 0.9% FLUSH
3.0000 mL | Freq: Two times a day (BID) | INTRAVENOUS | Status: DC
  Administered 2017-03-01: 3 mL via INTRAVENOUS
  Administered 2017-03-01: 10 mL via INTRAVENOUS
  Administered 2017-03-02 – 2017-03-03 (×3): 3 mL via INTRAVENOUS

## 2017-03-01 MED ORDER — ASPIRIN EC 81 MG PO TBEC
81.0000 mg | DELAYED_RELEASE_TABLET | Freq: Every day | ORAL | Status: DC
  Administered 2017-03-01 – 2017-03-03 (×3): 81 mg via ORAL
  Filled 2017-03-01 (×5): qty 1

## 2017-03-01 MED ORDER — ONDANSETRON HCL 4 MG/2ML IJ SOLN: 4.0000 mg | Freq: Four times a day (QID) | INTRAMUSCULAR | Status: DC | PRN

## 2017-03-01 MED ORDER — DIGOXIN 125 MCG PO TABS
0.1250 mg | ORAL_TABLET | Freq: Every day | ORAL | Status: DC
  Administered 2017-03-01 – 2017-03-03 (×3): 0.125 mg via ORAL
  Filled 2017-03-01 (×3): qty 1

## 2017-03-01 NOTE — Progress Notes (Signed)
PIV 22 g started x 1 attempt to [facility].  Patient tolerated well, saline locked.  Renee Pain, RN

## 2017-03-01 NOTE — Plan of Care (Signed)
Patient admitted to [facility] from heart failure clinic.  BP mildly elevated, short of breath even at rest d/t fluid overload.  Sister at bedside.

## 2017-03-01 NOTE — Research (Signed)
RESEARCH ENCOUNTER  Patient ID: Ryan [name]  DOB: 1950/05/03  [name] admitted for CHF.  PI aware of heart failure event and hospital admission.  Spoke with subject at bedside.  He stated that he has been taking his study medication and took one this morning.  Eight pills of IP delivered to the pharmacy for dispensing - [place] and Darrick Grinder, NP aware.  Subject unable to get any additional IP for dispensing while hospitalized because he lives in [place] and his sister is in [place].  Discussed medication compliance with subject.    Patient will follow up with [facility] in January.
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
## 2017-03-01 NOTE — Addendum Note (Signed)
Encounter addended by: Effie Berkshire, RN on: 03/01/2017 12:00 PM  Actions taken: Sign clinical note
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
## 2017-03-02 LAB — BASIC METABOLIC PANEL
Anion gap: 8 (ref 5–15)
BUN: 12 mg/dL (ref 6–20)
CALCIUM: 9.2 mg/dL (ref 8.9–10.3)
CHLORIDE: 105 mmol/L (ref 101–111)
CO2: 24 mmol/L (ref 22–32)
CREATININE: 1.39 mg/dL — AB (ref 0.61–1.24)
GFR calc non Af Amer: 51 mL/min — ABNORMAL LOW (ref >60)
GFR, EST AFRICAN AMERICAN: 59 mL/min — AB (ref >60)
GLUCOSE: 133 mg/dL — AB (ref 65–99)
Potassium: 3.4 mmol/L — ABNORMAL LOW (ref 3.5–5.1)
Sodium: 137 mmol/L (ref 135–145)

## 2017-03-02 MED ORDER — ISOSORBIDE MONONITRATE ER 30 MG PO TB24
30.0000 mg | ORAL_TABLET | Freq: Every day | ORAL | Status: DC
  Administered 2017-03-02 – 2017-03-03 (×2): 30 mg via ORAL
  Filled 2017-03-02 (×2): qty 1

## 2017-03-02 MED ORDER — SPIRONOLACTONE 12.5 MG HALF TABLET
12.5000 mg | ORAL_TABLET | Freq: Every day | ORAL | Status: DC
  Administered 2017-03-02 – 2017-03-03 (×2): 12.5 mg via ORAL
  Filled 2017-03-02 (×2): qty 1

## 2017-03-02 MED ORDER — POTASSIUM CHLORIDE CRYS ER 20 MEQ PO TBCR
40.0000 meq | EXTENDED_RELEASE_TABLET | Freq: Once | ORAL | Status: AC
  Administered 2017-03-02: 40 meq via ORAL
  Filled 2017-03-02: qty 2
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
## 2017-03-02 NOTE — Progress Notes (Signed)
Patient refused bed alarm. Will continue to monitor patient. 

## 2017-03-02 NOTE — Care Management Note (Addendum)
Case Management Note  Patient Details  Name: Ryan [name] MRN: [id number] Date of Birth: [birth year]  Subjective/Objective:   Admitted with CHF                 Action/Plan: PTA Pt lived at home with sister.  PT states ([place] PCP has changed from [name] to Dr. Ronelle Nigh; Dr Aubery Lapping left practice.  Home DME: Cane, leg brace (lt) and scale.  Prior to admission independent of ADL's, [place] for himself.  PT denies ability to afford medications.  Uses [facility] for medications. Uses Sisters/son for transportation for shopping, medical appointments. NCM will continue to follow for discharge needs.                 Expected Discharge Plan:  Home/Self Care  Discharge planning Services  CM Consult  Status of Service:  In process, will continue to follow  Kristen Cardinal, RN , BSN Case Manager-Orientation 03/02/2017, 10:12 AM [phone]
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
## 2017-03-02 NOTE — Plan of Care (Signed)
  Progressing Activity: Risk for activity intolerance will decrease 03/02/2017 1649 - Progressing by Alonna Buckler, RN Activity: Capacity to carry out activities will improve 03/02/2017 1649 - Progressing by Alonna Buckler, RN Cardiac: Ability to achieve and maintain adequate cardiopulmonary perfusion will improve 03/02/2017 1649 - Progressing by Alonna Buckler, RN

## 2017-03-03 LAB — BASIC METABOLIC PANEL
ANION GAP: 8 (ref 5–15)
BUN: 17 mg/dL (ref 6–20)
CALCIUM: 9.5 mg/dL (ref 8.9–10.3)
CO2: 25 mmol/L (ref 22–32)
CREATININE: 1.51 mg/dL — AB (ref 0.61–1.24)
Chloride: 104 mmol/L (ref 101–111)
GFR, EST AFRICAN AMERICAN: 53 mL/min — AB (ref >60)
GFR, EST NON AFRICAN AMERICAN: 46 mL/min — AB (ref >60)
Glucose, Bld: 103 mg/dL — ABNORMAL HIGH (ref 65–99)
Potassium: 3.7 mmol/L (ref 3.5–5.1)
SODIUM: 137 mmol/L (ref 135–145)

## 2017-03-03 MED ORDER — SPIRONOLACTONE 25 MG PO TABS: 12.5000 mg | ORAL_TABLET | Freq: Every day | ORAL | 2 refills | Status: DC

## 2017-03-03 NOTE — Plan of Care (Signed)
  Activity: Capacity to carry out activities will improve 03/03/2017 0702 - Completed/Met by [place], Sophronia Simas, RN

## 2017-03-03 NOTE — Progress Notes (Signed)
Pt requesting to speak to case manager regarding medicare for [employer]. Eustace Pen to inform , case manager aware
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
## 2017-03-03 NOTE — Progress Notes (Signed)
Pt discharged via wheelchair with nurse staff
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
## 2017-03-03 NOTE — Progress Notes (Addendum)
Pt IV discontinued, catheter intact and telemetry removed. Pt has all belongings including cane, scale and home prescriptions. Pt discharge education provided at bedside. Awaiting pt transport for discharge
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
## 2017-03-03 NOTE — Care Management (Addendum)
Pt asked to see CM regarding applying for Medicaid.  Since pt has d/c pending, advised pt to contact local Social Services office to begin application process.  Pt currently has Medicare A,B.

## 2017-03-03 NOTE — Plan of Care (Signed)
  Activity: Risk for activity intolerance will decrease 03/03/2017 0702 - Completed/Met by Evert Kohl, RN

## 2017-03-20 ENCOUNTER — Encounter: Payer: Medicare Other | Admitting: *Deleted

## 2017-03-20 VITALS — BP 138/76 | HR 95 | Wt 224.8 lb

## 2017-03-20 DIAGNOSIS — Z006 Encounter for examination for normal comparison and control in clinical research program: Secondary | ICD-10-CM

## 2017-03-20 MED FILL — SPIRONOLACTONE 25 MG TABLET: 25 | 30 days supply | Qty: 15 | Fill #0

## 2017-03-20 NOTE — Progress Notes (Signed)
RESEARCH ENCOUNTER  Patient ID: Ryan [name]  DOB: 09-11-1950  [name] presented to the [facility] for the Week 36 visit of the [facility].  No signs/symptoms of ACS since the last visit. IP compliance discussed with subject.  Re-educated on taking one IP in the morning and one IP in the evening.  In addition, discussed importance of taking all HF medications as prescribed.  Subject verbalized understanding and states he has been taking all medications since recent hospitalization.  [facility].

## 2017-03-23 ENCOUNTER — Encounter (HOSPITAL_COMMUNITY): Payer: Medicare Other

## 2017-04-06 ENCOUNTER — Ambulatory Visit (HOSPITAL_COMMUNITY)
Admission: RE | Admit: 2017-04-06 | Discharge: 2017-04-06 | Disposition: A | Discharge status: Home / Self Care | Payer: Medicare Other | Source: Ambulatory Visit | Attending: Cardiology | Admitting: Cardiology

## 2017-04-06 ENCOUNTER — Encounter (HOSPITAL_COMMUNITY): Payer: Self-pay

## 2017-04-06 VITALS — BP 142/88 | HR 105 | Wt 216.8 lb

## 2017-04-06 DIAGNOSIS — Z7982 Long term (current) use of aspirin: Secondary | ICD-10-CM | POA: Insufficient documentation

## 2017-04-06 DIAGNOSIS — Z79899 Other long term (current) drug therapy: Secondary | ICD-10-CM | POA: Insufficient documentation

## 2017-04-06 DIAGNOSIS — Z9119 Patient's noncompliance with other medical treatment and regimen: Secondary | ICD-10-CM | POA: Diagnosis not present

## 2017-04-06 DIAGNOSIS — N183 Chronic kidney disease, stage 3 unspecified: Secondary | ICD-10-CM

## 2017-04-06 DIAGNOSIS — H409 Unspecified glaucoma: Secondary | ICD-10-CM | POA: Diagnosis not present

## 2017-04-06 DIAGNOSIS — I272 Pulmonary hypertension, unspecified: Secondary | ICD-10-CM | POA: Diagnosis not present

## 2017-04-06 DIAGNOSIS — I5022 Chronic systolic (congestive) heart failure: Secondary | ICD-10-CM

## 2017-04-06 DIAGNOSIS — I10 Essential (primary) hypertension: Secondary | ICD-10-CM | POA: Diagnosis not present

## 2017-04-06 DIAGNOSIS — I5042 Chronic combined systolic (congestive) and diastolic (congestive) heart failure: Secondary | ICD-10-CM | POA: Insufficient documentation

## 2017-04-06 DIAGNOSIS — M1712 Unilateral primary osteoarthritis, left knee: Secondary | ICD-10-CM | POA: Diagnosis not present

## 2017-04-06 DIAGNOSIS — Z833 Family history of diabetes mellitus: Secondary | ICD-10-CM | POA: Insufficient documentation

## 2017-04-06 DIAGNOSIS — Z9889 Other specified postprocedural states: Secondary | ICD-10-CM | POA: Insufficient documentation

## 2017-04-06 DIAGNOSIS — Z806 Family history of leukemia: Secondary | ICD-10-CM | POA: Diagnosis not present

## 2017-04-06 DIAGNOSIS — F1211 Cannabis abuse, in remission: Secondary | ICD-10-CM | POA: Diagnosis not present

## 2017-04-06 DIAGNOSIS — I130 Hypertensive heart and chronic kidney disease with heart failure and stage 1 through stage 4 chronic kidney disease, or unspecified chronic kidney disease: Secondary | ICD-10-CM | POA: Diagnosis not present

## 2017-04-06 DIAGNOSIS — F17210 Nicotine dependence, cigarettes, uncomplicated: Secondary | ICD-10-CM | POA: Insufficient documentation

## 2017-04-06 DIAGNOSIS — F1010 Alcohol abuse, uncomplicated: Secondary | ICD-10-CM | POA: Insufficient documentation

## 2017-04-06 DIAGNOSIS — F1411 Cocaine abuse, in remission: Secondary | ICD-10-CM | POA: Insufficient documentation

## 2017-04-06 DIAGNOSIS — I429 Cardiomyopathy, unspecified: Secondary | ICD-10-CM | POA: Insufficient documentation

## 2017-04-06 DIAGNOSIS — J410 Simple chronic bronchitis: Secondary | ICD-10-CM | POA: Diagnosis not present

## 2017-04-06 DIAGNOSIS — R079 Chest pain, unspecified: Secondary | ICD-10-CM | POA: Insufficient documentation

## 2017-04-06 DIAGNOSIS — F17200 Nicotine dependence, unspecified, uncomplicated: Secondary | ICD-10-CM | POA: Diagnosis not present

## 2017-04-06 DIAGNOSIS — Z8249 Family history of ischemic heart disease and other diseases of the circulatory system: Secondary | ICD-10-CM | POA: Diagnosis not present

## 2017-04-06 DIAGNOSIS — N179 Acute kidney failure, unspecified: Secondary | ICD-10-CM | POA: Diagnosis not present

## 2017-04-06 DIAGNOSIS — K219 Gastro-esophageal reflux disease without esophagitis: Secondary | ICD-10-CM | POA: Diagnosis not present

## 2017-04-06 HISTORY — DX: Acute on chronic systolic (congestive) heart failure: I50.23

## 2017-04-06 LAB — BASIC METABOLIC PANEL
Anion gap: 13 (ref 5–15)
BUN: 6 mg/dL (ref 6–20)
CALCIUM: 9.8 mg/dL (ref 8.9–10.3)
CHLORIDE: 101 mmol/L (ref 101–111)
CO2: 23 mmol/L (ref 22–32)
CREATININE: 1.52 mg/dL — AB (ref 0.61–1.24)
GFR calc non Af Amer: 46 mL/min — ABNORMAL LOW (ref >60)
GFR, EST AFRICAN AMERICAN: 53 mL/min — AB (ref >60)
Glucose, Bld: 108 mg/dL — ABNORMAL HIGH (ref 65–99)
Potassium: 3.7 mmol/L (ref 3.5–5.1)
Sodium: 137 mmol/L (ref 135–145)

## 2017-04-06 MED ORDER — SPIRONOLACTONE 25 MG PO TABS: 25.0000 mg | ORAL_TABLET | Freq: Every day | ORAL | 11 refills | Status: DC

## 2017-04-06 NOTE — Patient Instructions (Signed)
Routine lab work today. Will notify you of abnormal results, otherwise no news is good news!  INCREASE Spironolactone to 25 mg (1 whole tablet) once daily.  Follow up 2 weeks with Oda Kilts PA-C.  Take all medication as prescribed the day of your appointment. Bring all medications with you to your appointment.  Do the following things EVERYDAY: 1) Weigh yourself in the morning before breakfast. Write it down and keep it in a log. 2) Take your medicines as prescribed 3) Eat low salt foods-Limit salt (sodium) to 2000 mg per day.  4) Stay as active as you can everyday 5) Limit all fluids for the day to less than 2 liters
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
## 2017-04-20 ENCOUNTER — Ambulatory Visit (HOSPITAL_COMMUNITY)
Admission: RE | Admit: 2017-04-20 | Discharge: 2017-04-20 | Disposition: A | Discharge status: Home / Self Care | Payer: Medicare Other | Source: Ambulatory Visit | Attending: Cardiology | Admitting: Cardiology

## 2017-04-20 ENCOUNTER — Encounter (HOSPITAL_COMMUNITY): Payer: Self-pay

## 2017-04-20 VITALS — BP 140/88 | HR 96 | Wt 213.4 lb

## 2017-04-20 DIAGNOSIS — Z833 Family history of diabetes mellitus: Secondary | ICD-10-CM | POA: Diagnosis not present

## 2017-04-20 DIAGNOSIS — F17210 Nicotine dependence, cigarettes, uncomplicated: Secondary | ICD-10-CM | POA: Insufficient documentation

## 2017-04-20 DIAGNOSIS — Z806 Family history of leukemia: Secondary | ICD-10-CM | POA: Insufficient documentation

## 2017-04-20 DIAGNOSIS — I130 Hypertensive heart and chronic kidney disease with heart failure and stage 1 through stage 4 chronic kidney disease, or unspecified chronic kidney disease: Secondary | ICD-10-CM | POA: Insufficient documentation

## 2017-04-20 DIAGNOSIS — I5042 Chronic combined systolic (congestive) and diastolic (congestive) heart failure: Secondary | ICD-10-CM | POA: Insufficient documentation

## 2017-04-20 DIAGNOSIS — Z8701 Personal history of pneumonia (recurrent): Secondary | ICD-10-CM | POA: Diagnosis not present

## 2017-04-20 DIAGNOSIS — I428 Other cardiomyopathies: Secondary | ICD-10-CM | POA: Diagnosis not present

## 2017-04-20 DIAGNOSIS — I5022 Chronic systolic (congestive) heart failure: Secondary | ICD-10-CM

## 2017-04-20 DIAGNOSIS — Z8249 Family history of ischemic heart disease and other diseases of the circulatory system: Secondary | ICD-10-CM | POA: Diagnosis not present

## 2017-04-20 DIAGNOSIS — Z7982 Long term (current) use of aspirin: Secondary | ICD-10-CM | POA: Diagnosis not present

## 2017-04-20 DIAGNOSIS — Z823 Family history of stroke: Secondary | ICD-10-CM | POA: Diagnosis not present

## 2017-04-20 DIAGNOSIS — K219 Gastro-esophageal reflux disease without esophagitis: Secondary | ICD-10-CM | POA: Diagnosis not present

## 2017-04-20 DIAGNOSIS — I10 Essential (primary) hypertension: Secondary | ICD-10-CM | POA: Diagnosis not present

## 2017-04-20 DIAGNOSIS — Z79899 Other long term (current) drug therapy: Secondary | ICD-10-CM | POA: Insufficient documentation

## 2017-04-20 DIAGNOSIS — F329 Major depressive disorder, single episode, unspecified: Secondary | ICD-10-CM | POA: Insufficient documentation

## 2017-04-20 DIAGNOSIS — N183 Chronic kidney disease, stage 3 unspecified: Secondary | ICD-10-CM

## 2017-04-20 DIAGNOSIS — J410 Simple chronic bronchitis: Secondary | ICD-10-CM | POA: Diagnosis not present

## 2017-04-20 DIAGNOSIS — M1990 Unspecified osteoarthritis, unspecified site: Secondary | ICD-10-CM | POA: Insufficient documentation

## 2017-04-20 DIAGNOSIS — M545 Low back pain: Secondary | ICD-10-CM | POA: Diagnosis not present

## 2017-04-20 DIAGNOSIS — G8929 Other chronic pain: Secondary | ICD-10-CM | POA: Diagnosis not present

## 2017-04-20 LAB — BASIC METABOLIC PANEL
Anion gap: 17 — ABNORMAL HIGH (ref 5–15)
BUN: 14 mg/dL (ref 6–20)
CHLORIDE: 103 mmol/L (ref 101–111)
CO2: 20 mmol/L — ABNORMAL LOW (ref 22–32)
CREATININE: 1.25 mg/dL — AB (ref 0.61–1.24)
Calcium: 9.9 mg/dL (ref 8.9–10.3)
GFR calc Af Amer: >60 mL/min (ref >60)
GFR, EST NON AFRICAN AMERICAN: 58 mL/min — AB (ref >60)
GLUCOSE: 131 mg/dL — AB (ref 65–99)
Potassium: 3.7 mmol/L (ref 3.5–5.1)
Sodium: 140 mmol/L (ref 135–145)

## 2017-04-20 MED ORDER — SACUBITRIL-VALSARTAN 24-26 MG PO TABS: 1.0000 | ORAL_TABLET | Freq: Two times a day (BID) | ORAL | 11 refills | Status: DC
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
## 2017-04-20 NOTE — Patient Instructions (Signed)
STOP lisinopril.  START Entresto 24-26 mg tablet twice daily.  Routine lab work today. Will notify you of abnormal results, otherwise no news is good news!  Return in 1-2 weeks for repeat labs and echocardiogram.  __________________________________________________________ Ryan [name] Code: 9001  Follow up 4 weeks with Oda Kilts PA-C.  ___________________________________________________________ Ryan [name] Code: 9002  Take all medication as prescribed the day of your appointment. Bring all medications with you to your appointment.  Do the following things EVERYDAY: 1) Weigh yourself in the morning before breakfast. Write it down and keep it in a log. 2) Take your medicines as prescribed 3) Eat low salt foods-Limit salt (sodium) to 2000 mg per day.  4) Stay as active as you can everyday 5) Limit all fluids for the day to less than 2 liters

## 2017-05-04 ENCOUNTER — Ambulatory Visit (HOSPITAL_COMMUNITY): Admission: RE | Admit: 2017-05-04 | Payer: Medicare Other | Source: Ambulatory Visit

## 2017-05-04 ENCOUNTER — Other Ambulatory Visit (HOSPITAL_COMMUNITY): Payer: Medicare Other

## 2017-05-18 ENCOUNTER — Encounter (HOSPITAL_COMMUNITY): Payer: Medicare Other

## 2017-05-21 ENCOUNTER — Telehealth (HOSPITAL_COMMUNITY): Payer: Self-pay | Admitting: Internal Medicine

## 2017-05-21 NOTE — Telephone Encounter (Signed)
Per staff message from Georgeanna Lea, RN called pt to reschedule appt from 05/18/17 w/APP since pt had to cancel d/t funeral.  Hulen Skains and spoke with pt's family member who stated pt was still asleep.  Family member said she would remind pt call back to reschedule his appt.

## 2017-05-25 ENCOUNTER — Encounter (HOSPITAL_COMMUNITY): Payer: Self-pay | Admitting: Internal Medicine

## 2017-05-25 ENCOUNTER — Telehealth (HOSPITAL_COMMUNITY): Payer: Self-pay | Admitting: Internal Medicine

## 2017-05-25 NOTE — Telephone Encounter (Signed)
Patient has not returned my call and message left on 05/21/17 with family member to reschedule his cancelled appt from 05/18/17.  No VM on phone to leave message and no answer.  Will mail letter to pt.

## 2017-06-18 ENCOUNTER — Encounter: Payer: Medicare Other | Admitting: *Deleted

## 2017-06-18 VITALS — BP 113/77 | HR 109 | Wt 209.8 lb

## 2017-06-18 DIAGNOSIS — Z006 Encounter for examination for normal comparison and control in clinical research program: Secondary | ICD-10-CM

## 2017-06-18 NOTE — Progress Notes (Signed)
RESEARCH ENCOUNTER  Patient ID: EEAN BUSS  DOB: 08/26/1950  [name] presented to the [facility] for the Week 48 visit of the [facility].  No signs/symptoms of ACS since the last visit.   Subject returned IP and additional IP dispensed.  Mr. Haile stated that he threw the metal pill sleeves away in the boxes that he took all the pills.  All IP boxes returned.  Reminded Mr. Ratterree to return the boxes and the metal pill sleeve at each research visit.  Subject verbalized understanding.  Reinforced importance of following up with HF Clinic.  Subject to make appt today for follow up.  Patient will follow up with Research Clinic in 16 weeks.

## 2017-06-28 ENCOUNTER — Encounter (HOSPITAL_COMMUNITY): Payer: Medicare Other

## 2017-10-22 DIAGNOSIS — R69 Illness, unspecified: Secondary | ICD-10-CM | POA: Diagnosis not present

## 2017-10-22 DIAGNOSIS — R5381 Other malaise: Secondary | ICD-10-CM | POA: Diagnosis not present

## 2017-10-26 ENCOUNTER — Telehealth: Payer: Self-pay | Admitting: *Deleted
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
## 2017-12-19 ENCOUNTER — Encounter (HOSPITAL_COMMUNITY): Payer: Medicare Other

## 2018-01-02 ENCOUNTER — Encounter (HOSPITAL_COMMUNITY): Payer: Medicare Other
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
## 2018-01-16 ENCOUNTER — Ambulatory Visit (HOSPITAL_COMMUNITY)
Admission: RE | Admit: 2018-01-16 | Discharge: 2018-01-16 | Disposition: A | Discharge status: Home / Self Care | Payer: Medicare Other | Source: Ambulatory Visit | Attending: Cardiology | Admitting: Cardiology

## 2018-01-16 ENCOUNTER — Other Ambulatory Visit: Payer: Self-pay

## 2018-01-16 ENCOUNTER — Encounter (HOSPITAL_COMMUNITY): Payer: Self-pay

## 2018-01-16 VITALS — BP 140/90 | HR 104 | Resp 16 | Wt 204.0 lb

## 2018-01-16 VITALS — BP 147/92 | HR 104 | Wt 204.0 lb

## 2018-01-16 DIAGNOSIS — I272 Pulmonary hypertension, unspecified: Secondary | ICD-10-CM | POA: Insufficient documentation

## 2018-01-16 DIAGNOSIS — I5042 Chronic combined systolic (congestive) and diastolic (congestive) heart failure: Secondary | ICD-10-CM | POA: Insufficient documentation

## 2018-01-16 DIAGNOSIS — I10 Essential (primary) hypertension: Secondary | ICD-10-CM

## 2018-01-16 DIAGNOSIS — Z006 Encounter for examination for normal comparison and control in clinical research program: Secondary | ICD-10-CM

## 2018-01-16 DIAGNOSIS — F17200 Nicotine dependence, unspecified, uncomplicated: Secondary | ICD-10-CM | POA: Diagnosis not present

## 2018-01-16 DIAGNOSIS — Z9119 Patient's noncompliance with other medical treatment and regimen: Secondary | ICD-10-CM | POA: Insufficient documentation

## 2018-01-16 DIAGNOSIS — I130 Hypertensive heart and chronic kidney disease with heart failure and stage 1 through stage 4 chronic kidney disease, or unspecified chronic kidney disease: Secondary | ICD-10-CM | POA: Insufficient documentation

## 2018-01-16 DIAGNOSIS — Z79899 Other long term (current) drug therapy: Secondary | ICD-10-CM | POA: Insufficient documentation

## 2018-01-16 DIAGNOSIS — F329 Major depressive disorder, single episode, unspecified: Secondary | ICD-10-CM | POA: Diagnosis not present

## 2018-01-16 DIAGNOSIS — I428 Other cardiomyopathies: Secondary | ICD-10-CM | POA: Diagnosis not present

## 2018-01-16 DIAGNOSIS — Z7982 Long term (current) use of aspirin: Secondary | ICD-10-CM | POA: Diagnosis not present

## 2018-01-16 DIAGNOSIS — I5022 Chronic systolic (congestive) heart failure: Secondary | ICD-10-CM | POA: Diagnosis not present

## 2018-01-16 DIAGNOSIS — F1010 Alcohol abuse, uncomplicated: Secondary | ICD-10-CM | POA: Insufficient documentation

## 2018-01-16 DIAGNOSIS — F17210 Nicotine dependence, cigarettes, uncomplicated: Secondary | ICD-10-CM | POA: Insufficient documentation

## 2018-01-16 DIAGNOSIS — F1011 Alcohol abuse, in remission: Secondary | ICD-10-CM

## 2018-01-16 DIAGNOSIS — K219 Gastro-esophageal reflux disease without esophagitis: Secondary | ICD-10-CM | POA: Insufficient documentation

## 2018-01-16 DIAGNOSIS — Z833 Family history of diabetes mellitus: Secondary | ICD-10-CM | POA: Insufficient documentation

## 2018-01-16 DIAGNOSIS — F1910 Other psychoactive substance abuse, uncomplicated: Secondary | ICD-10-CM | POA: Diagnosis not present

## 2018-01-16 DIAGNOSIS — N183 Chronic kidney disease, stage 3 unspecified: Secondary | ICD-10-CM

## 2018-01-16 DIAGNOSIS — M1712 Unilateral primary osteoarthritis, left knee: Secondary | ICD-10-CM | POA: Insufficient documentation

## 2018-01-16 LAB — BASIC METABOLIC PANEL
Anion gap: 9 (ref 5–15)
BUN: 11 mg/dL (ref 8–23)
CHLORIDE: 107 mmol/L (ref 98–111)
CO2: 22 mmol/L (ref 22–32)
Calcium: 9.7 mg/dL (ref 8.9–10.3)
Creatinine, Ser: 1.13 mg/dL (ref 0.61–1.24)
GFR calc Af Amer: >60 mL/min (ref >60)
GFR calc non Af Amer: >60 mL/min (ref >60)
GLUCOSE: 92 mg/dL (ref 70–99)
Potassium: 4.2 mmol/L (ref 3.5–5.1)
Sodium: 138 mmol/L (ref 135–145)

## 2018-01-16 MED ORDER — SACUBITRIL-VALSARTAN 24-26 MG PO TABS: 1.0000 | ORAL_TABLET | Freq: Two times a day (BID) | ORAL | 6 refills | Status: DC

## 2018-01-16 MED ORDER — DIGOXIN 125 MCG PO TABS: 0.1250 mg | ORAL_TABLET | Freq: Every day | ORAL | 6 refills | Status: DC

## 2018-01-16 MED ORDER — FUROSEMIDE 40 MG PO TABS: 40.0000 mg | ORAL_TABLET | Freq: Every day | ORAL | 6 refills | Status: DC

## 2018-01-16 MED FILL — FUROSEMIDE 40 MG TAB: 40 | 30 days supply | Qty: 30 | Fill #0

## 2018-01-16 MED FILL — DIGOXIN 0.125 MG TABLET: 125 | 30 days supply | Qty: 30 | Fill #0

## 2018-01-16 NOTE — Research (Signed)
Ryan [name] to research clinic for visit Week 80 in the [place] study.  Patient re-consented to Ryan [name].  No complaints, adverse events, or serious adverse events to report.  Patient re-educated on medication compliance and the importance of clinic visit, the patient verbalized understanding.  New medication dispensed.  Next clinic appointment scheduled.

## 2018-01-16 NOTE — Patient Instructions (Addendum)
RESTART Lasix 40mg  Daily   RESTART Entresto 24/26mg  twice a day  RESTART Digoxin 0.125mg  Daily  Your physician resquest you have labs drawn in 2 weeks at your appt with PharmD.   Your physician recommends that you schedule a follow-up appointment in: 2 weeks with PharmD 4 weeks with APP clinic 3-4 months with Dr. Haroldine Laws with an echo the same day.   Your physician has requested that you have an echocardiogram. Echocardiography is a painless test that uses sound waves to create images of your heart. It provides your doctor with information about the size and shape of your heart and how well your heart's chambers and valves are working. This procedure takes approximately one hour. There are no restrictions for this procedure.

## 2018-01-27 IMAGING — DX DG CHEST 2V
2 series · 2 of 2 positions shown · non-contrast
Comparison: 11/18/2013

CLINICAL DATA: Shortness of breath for 3 weeks with productive
cough, clear mucus. Patient reports tightness and soreness in the
chest. Lightheaded dizzy nausea. History of CHF.

EXAM:
CHEST  2 VIEW

[chest pa]
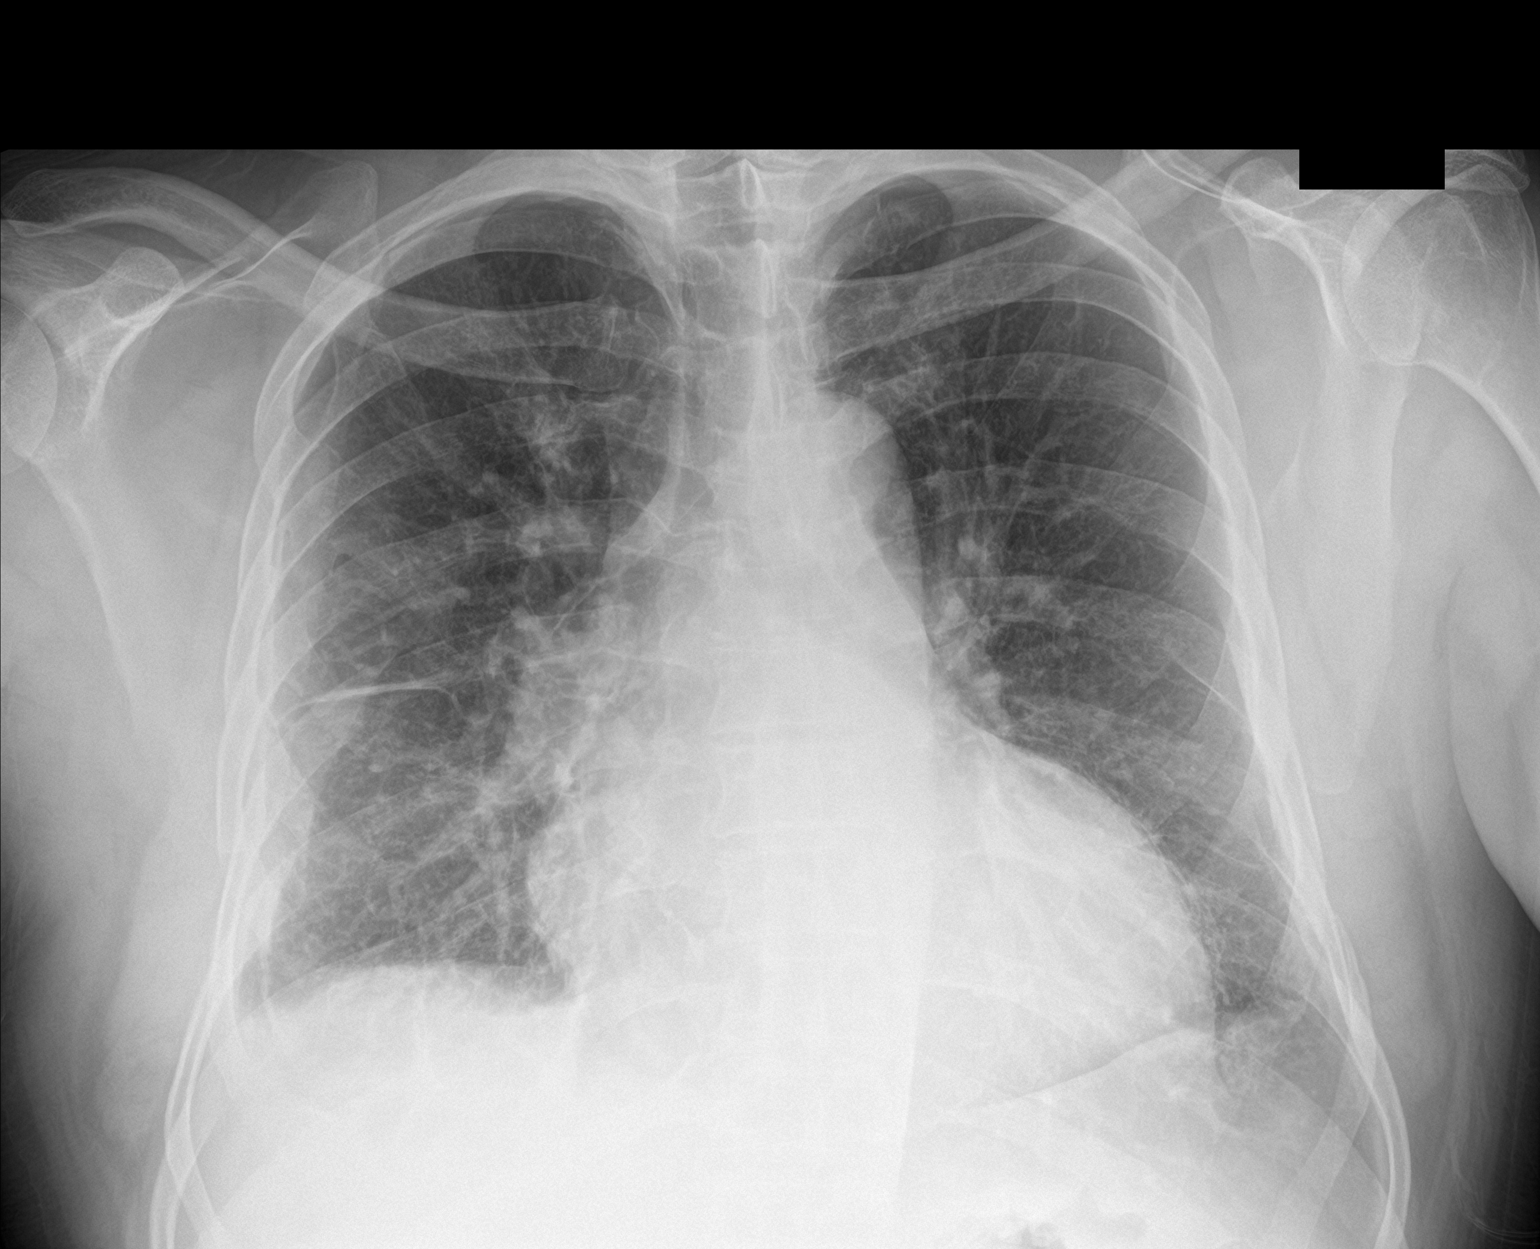

[chest lat]
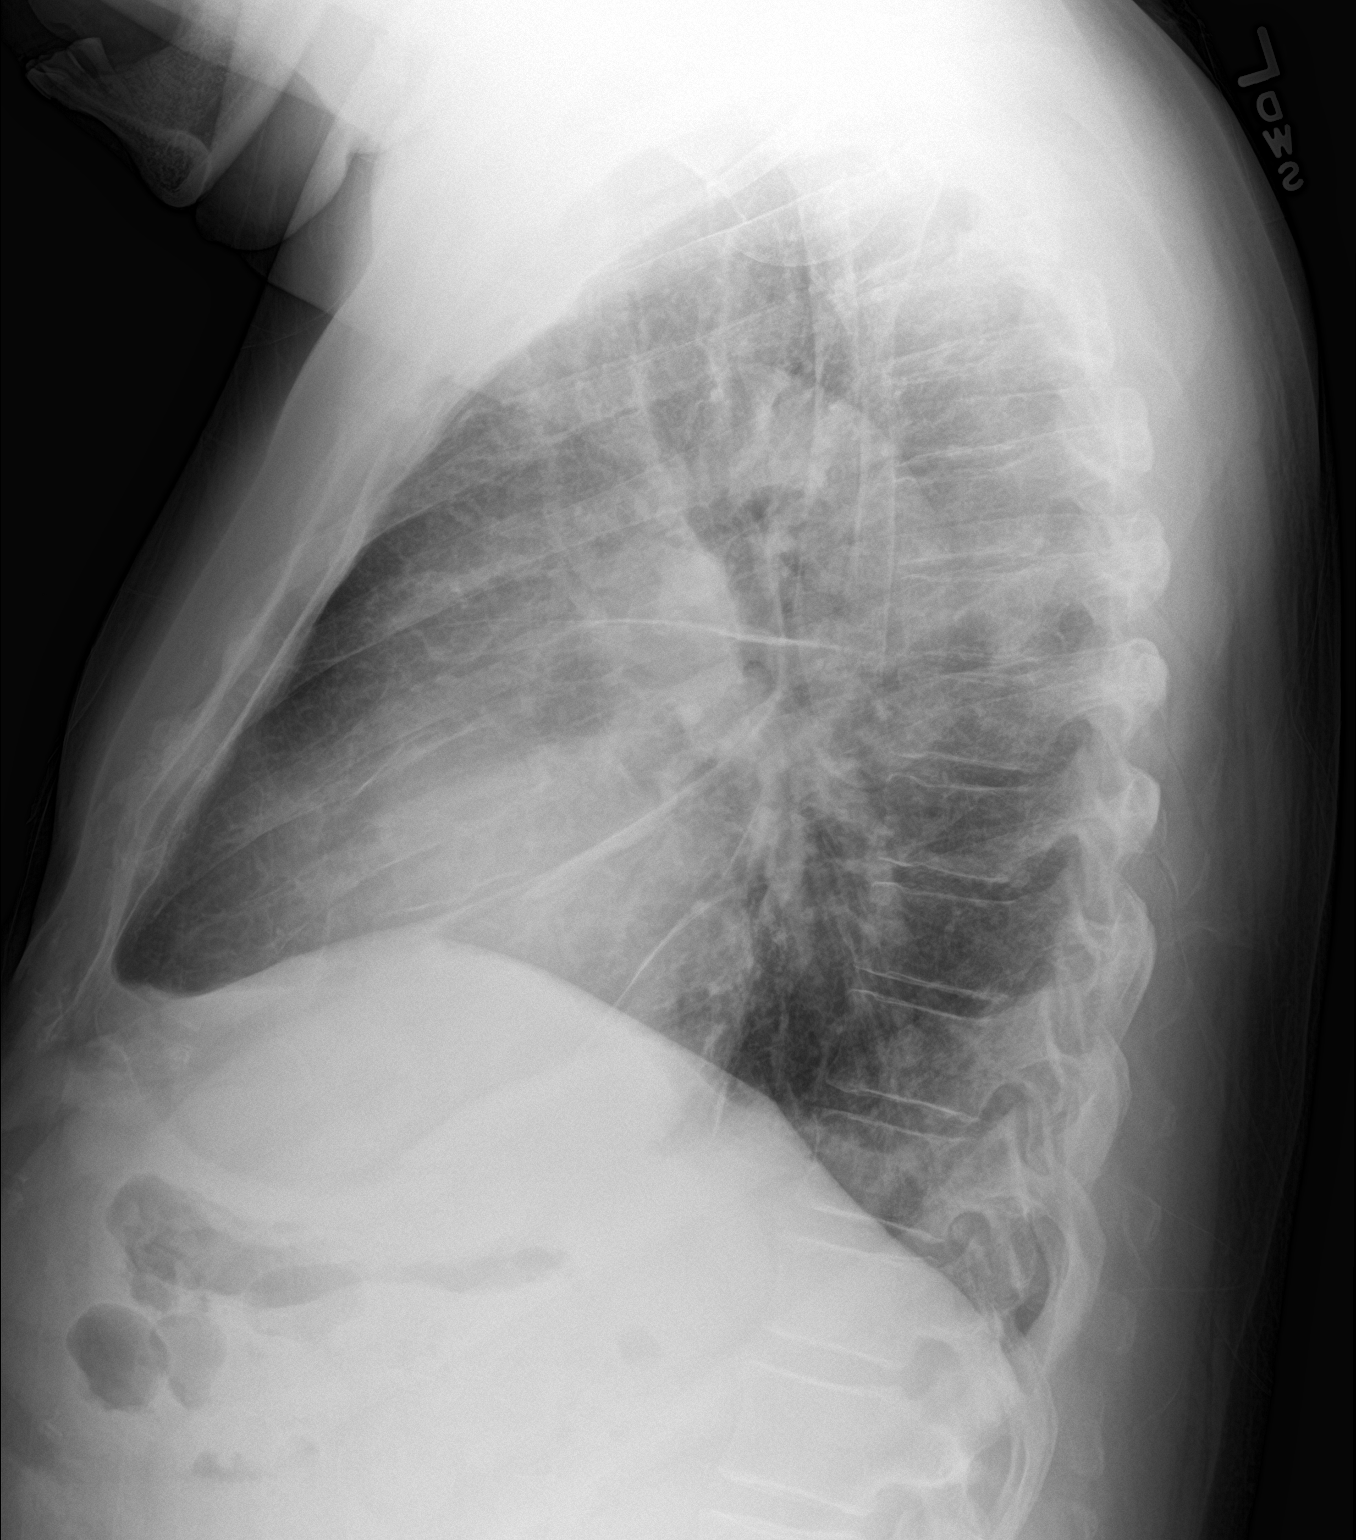

[2 of 2 positions shown; findings below may reference images not displayed]

FINDINGS: PA and lateral views of the chest are obtained. Mild interstitial
prominence in the left lung base could relate to mild fibrosis.
There is a small amount of right CP angle and lateral pleural
thickening/effusion with fluid along the right minor fissure. There
is hazy opacification of the right lateral lung, which may relate to
pleural effusion or infiltrate. There is posterior lower airspace
opacity overlying the spine on lateral view, suspicious for
infiltrate. There is mild cardiomegaly, increased compared to prior
with mild central congestion present. Asymmetric increased density
in the right hilus. No pneumothorax. Old right upper rib fractures
are again noted.
IMPRESSION: 1. Small amount of left CP angle pleural effusion or thickening.
Small amount of right CP angle and lateral pleural effusion or
thickening with fluid or thickening extending along the right minor
fissure. Vague increased opacity posterior lung base on lateral view
which may relate to an infiltrate, however short interval
radiographic follow-up advised to ensure clearing.
2. Interval enlargement of the cardiomediastinal silhouette with
mild central vascular congestion present. Asymmetric opacity in the
right hilus could relate to engorged pulmonary artery, however short
interval radiographic follow-up recommended to exclude other
etiology such as adenopathy or mass.

## 2018-01-30 ENCOUNTER — Ambulatory Visit (HOSPITAL_COMMUNITY)
Admission: RE | Admit: 2018-01-30 | Discharge: 2018-01-30 | Disposition: A | Discharge status: Home / Self Care | Payer: Medicare Other | Source: Ambulatory Visit | Attending: Internal Medicine | Admitting: Internal Medicine

## 2018-02-02 ENCOUNTER — Emergency Department (HOSPITAL_COMMUNITY): Payer: Medicare Other

## 2018-02-02 ENCOUNTER — Other Ambulatory Visit: Payer: Self-pay

## 2018-02-02 ENCOUNTER — Encounter (HOSPITAL_COMMUNITY): Payer: Self-pay | Admitting: Emergency Medicine

## 2018-02-02 ENCOUNTER — Inpatient Hospital Stay (HOSPITAL_COMMUNITY)
Admission: EM | Admit: 2018-02-02 | Discharge: 2018-02-06 | DRG: 378 | Disposition: A | Discharge status: Home / Self Care | Payer: Medicare Other | Attending: Oncology | Admitting: Oncology

## 2018-02-02 DIAGNOSIS — L918 Other hypertrophic disorders of the skin: Secondary | ICD-10-CM | POA: Diagnosis present

## 2018-02-02 DIAGNOSIS — K5731 Diverticulosis of large intestine without perforation or abscess with bleeding: Secondary | ICD-10-CM | POA: Diagnosis present

## 2018-02-02 DIAGNOSIS — K921 Melena: Principal | ICD-10-CM

## 2018-02-02 DIAGNOSIS — N182 Chronic kidney disease, stage 2 (mild): Secondary | ICD-10-CM | POA: Diagnosis present

## 2018-02-02 DIAGNOSIS — E860 Dehydration: Secondary | ICD-10-CM | POA: Diagnosis not present

## 2018-02-02 DIAGNOSIS — K269 Duodenal ulcer, unspecified as acute or chronic, without hemorrhage or perforation: Secondary | ICD-10-CM

## 2018-02-02 DIAGNOSIS — B370 Candidal stomatitis: Secondary | ICD-10-CM

## 2018-02-02 DIAGNOSIS — N183 Chronic kidney disease, stage 3 unspecified: Secondary | ICD-10-CM | POA: Diagnosis present

## 2018-02-02 DIAGNOSIS — R000 Tachycardia, unspecified: Secondary | ICD-10-CM | POA: Diagnosis not present

## 2018-02-02 DIAGNOSIS — R1084 Generalized abdominal pain: Secondary | ICD-10-CM | POA: Diagnosis not present

## 2018-02-02 DIAGNOSIS — Z791 Long term (current) use of non-steroidal anti-inflammatories (NSAID): Secondary | ICD-10-CM

## 2018-02-02 DIAGNOSIS — Z79899 Other long term (current) drug therapy: Secondary | ICD-10-CM

## 2018-02-02 DIAGNOSIS — K5730 Diverticulosis of large intestine without perforation or abscess without bleeding: Secondary | ICD-10-CM | POA: Diagnosis not present

## 2018-02-02 DIAGNOSIS — Z8719 Personal history of other diseases of the digestive system: Secondary | ICD-10-CM

## 2018-02-02 DIAGNOSIS — R079 Chest pain, unspecified: Secondary | ICD-10-CM | POA: Diagnosis not present

## 2018-02-02 DIAGNOSIS — Z7982 Long term (current) use of aspirin: Secondary | ICD-10-CM

## 2018-02-02 DIAGNOSIS — I130 Hypertensive heart and chronic kidney disease with heart failure and stage 1 through stage 4 chronic kidney disease, or unspecified chronic kidney disease: Secondary | ICD-10-CM | POA: Diagnosis present

## 2018-02-02 DIAGNOSIS — E861 Hypovolemia: Secondary | ICD-10-CM | POA: Diagnosis not present

## 2018-02-02 DIAGNOSIS — F1910 Other psychoactive substance abuse, uncomplicated: Secondary | ICD-10-CM | POA: Diagnosis present

## 2018-02-02 DIAGNOSIS — I428 Other cardiomyopathies: Secondary | ICD-10-CM | POA: Diagnosis present

## 2018-02-02 DIAGNOSIS — K219 Gastro-esophageal reflux disease without esophagitis: Secondary | ICD-10-CM | POA: Diagnosis present

## 2018-02-02 DIAGNOSIS — K922 Gastrointestinal hemorrhage, unspecified: Secondary | ICD-10-CM

## 2018-02-02 DIAGNOSIS — N3001 Acute cystitis with hematuria: Secondary | ICD-10-CM

## 2018-02-02 DIAGNOSIS — F17210 Nicotine dependence, cigarettes, uncomplicated: Secondary | ICD-10-CM | POA: Diagnosis present

## 2018-02-02 DIAGNOSIS — F1010 Alcohol abuse, uncomplicated: Secondary | ICD-10-CM | POA: Diagnosis present

## 2018-02-02 DIAGNOSIS — R933 Abnormal findings on diagnostic imaging of other parts of digestive tract: Secondary | ICD-10-CM | POA: Diagnosis not present

## 2018-02-02 DIAGNOSIS — I5042 Chronic combined systolic (congestive) and diastolic (congestive) heart failure: Secondary | ICD-10-CM

## 2018-02-02 DIAGNOSIS — Z9119 Patient's noncompliance with other medical treatment and regimen: Secondary | ICD-10-CM

## 2018-02-02 LAB — I-STAT TROPONIN, ED: Troponin i, poc: 0.02 ng/mL (ref 0.00–0.08)

## 2018-02-02 LAB — URINALYSIS, ROUTINE W REFLEX MICROSCOPIC
Bilirubin Urine: NEGATIVE
Glucose, UA: NEGATIVE mg/dL
Ketones, ur: NEGATIVE mg/dL
Nitrite: POSITIVE — AB
Protein, ur: NEGATIVE mg/dL
Specific Gravity, Urine: 1.023 (ref 1.005–1.030)
WBC, UA: >50 WBC/hpf — ABNORMAL HIGH (ref 0–5)
pH: 5 (ref 5.0–8.0)

## 2018-02-02 LAB — CBC
HCT: 43.2 % (ref 39.0–52.0)
Hemoglobin: 13.7 g/dL (ref 13.0–17.0)
MCH: 29 pg (ref 26.0–34.0)
MCHC: 31.7 g/dL (ref 30.0–36.0)
MCV: 91.5 fL (ref 80.0–100.0)
Platelets: 230 10*3/uL (ref 150–400)
RBC: 4.72 MIL/uL (ref 4.22–5.81)
RDW: 15.1 % (ref 11.5–15.5)
WBC: 12.2 10*3/uL — ABNORMAL HIGH (ref 4.0–10.5)
nRBC: 0 % (ref 0.0–0.2)

## 2018-02-02 LAB — CBC WITH DIFFERENTIAL/PLATELET
Abs Immature Granulocytes: 0.03 10*3/uL (ref 0.00–0.07)
Basophils Absolute: 0 10*3/uL (ref 0.0–0.1)
Basophils Relative: 0 %
Eosinophils Absolute: 0.4 10*3/uL (ref 0.0–0.5)
Eosinophils Relative: 5 %
HCT: 46.1 % (ref 39.0–52.0)
Hemoglobin: 14.5 g/dL (ref 13.0–17.0)
Immature Granulocytes: 0 %
Lymphocytes Relative: 19 %
Lymphs Abs: 1.6 10*3/uL (ref 0.7–4.0)
MCH: 28.7 pg (ref 26.0–34.0)
MCHC: 31.5 g/dL (ref 30.0–36.0)
MCV: 91.1 fL (ref 80.0–100.0)
Monocytes Absolute: 0.8 10*3/uL (ref 0.1–1.0)
Monocytes Relative: 9 %
Neutro Abs: 5.6 10*3/uL (ref 1.7–7.7)
Neutrophils Relative %: 67 %
Platelets: 265 10*3/uL (ref 150–400)
RBC: 5.06 MIL/uL (ref 4.22–5.81)
RDW: 15.4 % (ref 11.5–15.5)
WBC: 8.4 10*3/uL (ref 4.0–10.5)
nRBC: 0 % (ref 0.0–0.2)

## 2018-02-02 LAB — COMPREHENSIVE METABOLIC PANEL
ALT: 32 U/L (ref 0–44)
AST: 44 U/L — ABNORMAL HIGH (ref 15–41)
Albumin: 3.7 g/dL (ref 3.5–5.0)
Alkaline Phosphatase: 90 U/L (ref 38–126)
Anion gap: 8 (ref 5–15)
BUN: 13 mg/dL (ref 8–23)
CO2: 23 mmol/L (ref 22–32)
Calcium: 9.6 mg/dL (ref 8.9–10.3)
Chloride: 105 mmol/L (ref 98–111)
Creatinine, Ser: 1.03 mg/dL (ref 0.61–1.24)
GFR calc Af Amer: >60 mL/min (ref >60)
GFR calc non Af Amer: >60 mL/min (ref >60)
Glucose, Bld: 109 mg/dL — ABNORMAL HIGH (ref 70–99)
Potassium: 4 mmol/L (ref 3.5–5.1)
Sodium: 136 mmol/L (ref 135–145)
Total Bilirubin: 0.7 mg/dL (ref 0.3–1.2)
Total Protein: 7.3 g/dL (ref 6.5–8.1)

## 2018-02-02 LAB — BRAIN NATRIURETIC PEPTIDE: B Natriuretic Peptide: 122.2 pg/mL — ABNORMAL HIGH (ref 0.0–100.0)

## 2018-02-02 LAB — AMYLASE: Amylase: 35 U/L (ref 28–100)

## 2018-02-02 LAB — LIPASE, BLOOD: Lipase: 40 U/L (ref 11–51)

## 2018-02-02 LAB — POC OCCULT BLOOD, ED: Fecal Occult Bld: POSITIVE — AB

## 2018-02-02 MED ORDER — ACETAMINOPHEN 325 MG PO TABS
650.0000 mg | ORAL_TABLET | Freq: Four times a day (QID) | ORAL | Status: DC | PRN
  Administered 2018-02-02 – 2018-02-06 (×5): 650 mg via ORAL
  Filled 2018-02-02 (×6): qty 2

## 2018-02-02 MED ORDER — FAMOTIDINE IN NACL 20-0.9 MG/50ML-% IV SOLN
20.0000 mg | Freq: Once | INTRAVENOUS | Status: AC
  Administered 2018-02-02: 20 mg via INTRAVENOUS
  Filled 2018-02-02: qty 50

## 2018-02-02 MED ORDER — SODIUM CHLORIDE 0.9 % IV BOLUS: 500.0000 mL | Freq: Once | INTRAVENOUS | Status: DC

## 2018-02-02 MED ORDER — MORPHINE SULFATE (PF) 4 MG/ML IV SOLN
4.0000 mg | Freq: Once | INTRAVENOUS | Status: AC
  Administered 2018-02-02: 4 mg via INTRAVENOUS
  Filled 2018-02-02: qty 1

## 2018-02-02 MED ORDER — IOHEXOL 300 MG/ML  SOLN
100.0000 mL | Freq: Once | INTRAMUSCULAR | Status: AC | PRN
  Administered 2018-02-02: 100 mL via INTRAVENOUS

## 2018-02-02 MED ORDER — FOLIC ACID 1 MG PO TABS
1.0000 mg | ORAL_TABLET | Freq: Every day | ORAL | Status: DC
  Administered 2018-02-02 – 2018-02-06 (×5): 1 mg via ORAL
  Filled 2018-02-02 (×5): qty 1

## 2018-02-02 MED ORDER — SODIUM CHLORIDE 0.9% FLUSH
3.0000 mL | Freq: Two times a day (BID) | INTRAVENOUS | Status: DC
  Administered 2018-02-02 – 2018-02-06 (×6): 3 mL via INTRAVENOUS

## 2018-02-02 MED ORDER — PANTOPRAZOLE SODIUM 40 MG PO TBEC
40.0000 mg | DELAYED_RELEASE_TABLET | Freq: Two times a day (BID) | ORAL | Status: DC
  Administered 2018-02-02 – 2018-02-06 (×9): 40 mg via ORAL
  Filled 2018-02-02 (×9): qty 1

## 2018-02-02 MED ORDER — NYSTATIN 100000 UNIT/ML MT SUSP
5.0000 mL | Freq: Four times a day (QID) | OROMUCOSAL | Status: DC
  Administered 2018-02-02 – 2018-02-06 (×14): 500000 [IU] via ORAL
  Filled 2018-02-02 (×14): qty 5

## 2018-02-02 MED ORDER — ACETAMINOPHEN 650 MG RE SUPP: 650.0000 mg | Freq: Four times a day (QID) | RECTAL | Status: DC | PRN

## 2018-02-02 MED ORDER — ONDANSETRON HCL 4 MG/2ML IJ SOLN
4.0000 mg | Freq: Four times a day (QID) | INTRAMUSCULAR | Status: DC | PRN
  Administered 2018-02-03 – 2018-02-04 (×2): 4 mg via INTRAVENOUS
  Filled 2018-02-02 (×2): qty 2

## 2018-02-02 MED ORDER — ALUM & MAG HYDROXIDE-SIMETH 200-200-20 MG/5ML PO SUSP
30.0000 mL | Freq: Once | ORAL | Status: AC
  Administered 2018-02-02: 30 mL via ORAL
  Filled 2018-02-02: qty 30

## 2018-02-02 MED ORDER — LIDOCAINE VISCOUS HCL 2 % MT SOLN
15.0000 mL | Freq: Once | OROMUCOSAL | Status: AC
  Administered 2018-02-02: 15 mL via ORAL
  Filled 2018-02-02: qty 15

## 2018-02-02 MED ORDER — GABAPENTIN 300 MG PO CAPS
300.0000 mg | ORAL_CAPSULE | Freq: Three times a day (TID) | ORAL | Status: DC
  Administered 2018-02-02 – 2018-02-06 (×12): 300 mg via ORAL
  Filled 2018-02-02 (×12): qty 1

## 2018-02-02 MED ORDER — LORAZEPAM 1 MG PO TABS
1.0000 mg | ORAL_TABLET | Freq: Three times a day (TID) | ORAL | Status: DC | PRN
  Administered 2018-02-03 (×2): 1 mg via ORAL
  Filled 2018-02-02 (×2): qty 1

## 2018-02-02 MED ORDER — ADULT MULTIVITAMIN W/MINERALS CH
1.0000 | ORAL_TABLET | Freq: Every day | ORAL | Status: DC
  Administered 2018-02-02 – 2018-02-06 (×5): 1 via ORAL
  Filled 2018-02-02 (×5): qty 1

## 2018-02-02 MED ORDER — SACUBITRIL-VALSARTAN 24-26 MG PO TABS
1.0000 | ORAL_TABLET | Freq: Two times a day (BID) | ORAL | Status: DC
  Administered 2018-02-03 – 2018-02-05 (×6): 1 via ORAL
  Filled 2018-02-02 (×8): qty 1

## 2018-02-02 MED ORDER — DIGOXIN 125 MCG PO TABS
0.1250 mg | ORAL_TABLET | Freq: Every day | ORAL | Status: DC
  Administered 2018-02-02 – 2018-02-06 (×5): 0.125 mg via ORAL
  Filled 2018-02-02 (×5): qty 1

## 2018-02-02 MED ORDER — ONDANSETRON HCL 4 MG PO TABS: 4.0000 mg | ORAL_TABLET | Freq: Four times a day (QID) | ORAL | Status: DC | PRN

## 2018-02-02 MED ORDER — SODIUM CHLORIDE 0.9 % IV SOLN
1.0000 g | Freq: Once | INTRAVENOUS | Status: DC
  Filled 2018-02-02: qty 10

## 2018-02-02 MED ORDER — ONDANSETRON HCL 4 MG/2ML IJ SOLN
4.0000 mg | Freq: Once | INTRAMUSCULAR | Status: AC
  Administered 2018-02-02: 4 mg via INTRAVENOUS
  Filled 2018-02-02: qty 2

## 2018-02-02 MED ORDER — SUCRALFATE 1 GM/10ML PO SUSP
1.0000 g | Freq: Three times a day (TID) | ORAL | Status: DC
  Administered 2018-02-02 – 2018-02-05 (×10): 1 g via ORAL
  Filled 2018-02-02 (×10): qty 10

## 2018-02-02 MED ORDER — VITAMIN B-1 100 MG PO TABS
100.0000 mg | ORAL_TABLET | Freq: Every day | ORAL | Status: DC
  Administered 2018-02-02 – 2018-02-06 (×5): 100 mg via ORAL
  Filled 2018-02-02 (×5): qty 1
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
## 2018-02-02 NOTE — ED Triage Notes (Addendum)
Pt arrives to ED from home with complaints of chest pain & abdominal pain starting at 1am when he had a bowel movement and saw thick dark red blood in his stool. Pt reports he has had blood in his stool for about a month now but today was worse than normal. Pt states that he drinks alcohol everyday and drank egg nog mixed with gin this morning before arrival. Pt has hx of pancreatitis. Pt placed in position of comfort with bed locked and lowered, call bell in reach.
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
## 2018-02-03 DIAGNOSIS — K922 Gastrointestinal hemorrhage, unspecified: Secondary | ICD-10-CM | POA: Diagnosis not present

## 2018-02-03 DIAGNOSIS — R82998 Other abnormal findings in urine: Secondary | ICD-10-CM

## 2018-02-03 DIAGNOSIS — Z79899 Other long term (current) drug therapy: Secondary | ICD-10-CM

## 2018-02-03 DIAGNOSIS — F1010 Alcohol abuse, uncomplicated: Secondary | ICD-10-CM

## 2018-02-03 DIAGNOSIS — I5042 Chronic combined systolic (congestive) and diastolic (congestive) heart failure: Secondary | ICD-10-CM | POA: Diagnosis not present

## 2018-02-03 DIAGNOSIS — R933 Abnormal findings on diagnostic imaging of other parts of digestive tract: Secondary | ICD-10-CM | POA: Diagnosis not present

## 2018-02-03 DIAGNOSIS — K921 Melena: Secondary | ICD-10-CM | POA: Diagnosis not present

## 2018-02-03 DIAGNOSIS — R1084 Generalized abdominal pain: Secondary | ICD-10-CM | POA: Diagnosis not present

## 2018-02-03 LAB — COMPREHENSIVE METABOLIC PANEL
ALT: 26 U/L (ref 0–44)
AST: 26 U/L (ref 15–41)
Albumin: 3.4 g/dL — ABNORMAL LOW (ref 3.5–5.0)
Alkaline Phosphatase: 83 U/L (ref 38–126)
Anion gap: 8 (ref 5–15)
BUN: 10 mg/dL (ref 8–23)
CO2: 24 mmol/L (ref 22–32)
Calcium: 9.1 mg/dL (ref 8.9–10.3)
Chloride: 103 mmol/L (ref 98–111)
Creatinine, Ser: 1.13 mg/dL (ref 0.61–1.24)
GFR calc Af Amer: >60 mL/min (ref >60)
GFR calc non Af Amer: >60 mL/min (ref >60)
Glucose, Bld: 115 mg/dL — ABNORMAL HIGH (ref 70–99)
Potassium: 4.1 mmol/L (ref 3.5–5.1)
Sodium: 135 mmol/L (ref 135–145)
Total Bilirubin: 0.9 mg/dL (ref 0.3–1.2)
Total Protein: 7.1 g/dL (ref 6.5–8.1)

## 2018-02-03 LAB — CBC
HCT: 41.1 % (ref 39.0–52.0)
Hemoglobin: 13.1 g/dL (ref 13.0–17.0)
MCH: 29 pg (ref 26.0–34.0)
MCHC: 31.9 g/dL (ref 30.0–36.0)
MCV: 90.9 fL (ref 80.0–100.0)
Platelets: 201 10*3/uL (ref 150–400)
RBC: 4.52 MIL/uL (ref 4.22–5.81)
RDW: 14.9 % (ref 11.5–15.5)
WBC: 9.4 10*3/uL (ref 4.0–10.5)
nRBC: 0 % (ref 0.0–0.2)

## 2018-02-03 LAB — URINE CULTURE: Culture: >=100000 — AB

## 2018-02-03 LAB — FERRITIN: Ferritin: 42 ng/mL (ref 24–336)

## 2018-02-03 LAB — HIV ANTIBODY (ROUTINE TESTING W REFLEX): HIV Screen 4th Generation wRfx: NONREACTIVE

## 2018-02-03 MED ORDER — LIDOCAINE VISCOUS HCL 2 % MT SOLN
15.0000 mL | Freq: Once | OROMUCOSAL | Status: AC
  Administered 2018-02-03: 15 mL via ORAL
  Filled 2018-02-03: qty 15

## 2018-02-03 MED ORDER — DEXTROSE-NACL 5-0.45 % IV SOLN
INTRAVENOUS | Status: AC
  Administered 2018-02-03: 18:00:00 via INTRAVENOUS

## 2018-02-03 MED ORDER — ALUM & MAG HYDROXIDE-SIMETH 200-200-20 MG/5ML PO SUSP
30.0000 mL | Freq: Once | ORAL | Status: AC
  Administered 2018-02-03: 30 mL via ORAL
  Filled 2018-02-03: qty 30

## 2018-02-03 MED ORDER — HYDROMORPHONE HCL 1 MG/ML IJ SOLN
0.5000 mg | INTRAMUSCULAR | Status: DC | PRN
  Administered 2018-02-03 – 2018-02-05 (×7): 0.5 mg via INTRAVENOUS
  Filled 2018-02-03 (×9): qty 0.5
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
## 2018-02-03 NOTE — Care Management Obs Status (Signed)
[facility] NOTIFICATION   Patient Details  Name: Ryan [name] MRN: [id number] Date of Birth: 10/11/1950   Medicare Observation Status Notification Given:  Yes    Carles Collet, RN 02/03/2018, 3:54 PM

## 2018-02-03 NOTE — Progress Notes (Addendum)
Date: 02/03/2018  Patient name: LOUIS IVERY  Medical record number: [id number]  Date of birth: 1950/12/03   I have seen and evaluated [name] and discussed their care with the Residency Team.  Mr. Saccente is a 67 year old male with history of alcohol abuse who presented to the ED with a 4-week history of worsening diffuse abdominal pain which is worst in the epigastric region.  He also complains of dark red blood in his stool with some blood clots over the same time course.  He denies use of any NSAID.  He also denied dysuria.  He was found to be guaiac positive in the ED.  His hemoglobin however was normal and a 13.  He had a CT scan of the abdomen which showed duodenal wall thickening with duodenal mucosal hyperenhancement with infiltrative changes extending to the pancreatic head.  These favor duodenal ulcer disease rather than acute pancreatitis.  This morning, he has only had a sip of water to take his medications.  He continues to have severe abdominal pain.  He has not had a bowel movement since the ED.  Vitals:   02/02/18 2050 02/03/18 0515  BP: (!) 140/95 (!) 141/92  Pulse: (!) 103 81  Resp: 18 18  Temp: 97.7 F (36.5 C) 98 F (36.7 C)  SpO2: 100% 100%  Appears in discomfort but stable, rubbing upper abd HRRR no MRG ABD decreased BS, soft, tenderness assess by Dr Marilynne Halsted Ext no edema  HgB 13.7 - 13.1 WBC 12.2 - 9.4 Plts 230 - 201 Cr 1.13 BUN 10 Ferritin 42 HIV negative UA 0-5 sq epi. lg LE, + nitrite, >50 WBC, 11-20 RBC,   I personally viewed the CXR images and confirmed my reading with the official read. PA and lateral. Good quality. No abnl  I personally viewed the EKG and confirmed my reading with the official read. Sinus, LAD, BAE changes.  Assessment and Plan: I have seen and evaluated the patient as outlined above. I agree with the formulated Assessment and Plan as detailed in the residents' note, with the following changes: Mr. Copenhaver is a 67 year old  man with alcohol abuse who presents with maroon stools and abdominal pain for 4 weeks.  His hemoglobin and vitals are stable but his CT suggested a duodenal ulcer.  His BUN is not elevated as he would expect with an upper GI.  He has been started on an oral PPI and Carafate.  We are consulting GI as he has several risk factors that would suggest an EGD with biopsies would be indicated to rule out malignancy.  He will need to be tested for H. pylori either via an EGD or noninvasively.  We have not ordered this as an upper GI bleed will affect the sensitivity and specificity of all tests except for the serum antibody level.  If GI does not feel an EGD is indicated at this time, we will check a serum antibody for H. pylori.  Continue n.p.o. Status.  1.  Upper GI bleed hemodynamically stable -consult GI for consideration of EGD.  H. pylori testing.  Continue PPI and Carafate.  Continue n.p.o. status.  Hemoglobin trend can be checked daily as he has not had a downward trend.  Pain control with opioids parenterally.  2.  Chronic combined heart failure without exacerbation -his EF in September 2018 was 10 to 50%.  He is currently not volume overloaded.  His digoxin was last checked a year ago and was undetectable.  He just resume  his digoxin earlier this month after having been out.  We will need to check a digoxin level to ensure it is in the therapeutic range from 0.5-0.9.  We have also continued his Delene Loll that are holding his Lasix.  3.  Alcohol abuse - CIWA protocol.  4.  Abnormal UA -he denies any dysuria despite his UA showing leukocyte esterase, nitrites, and greater than 50 white blood cells.  He also has 11-20 RBCs. He will need to have a UA repeated as an outpatient to ensure these findings have resolved.  If they have not, he will need urology follow-up for microscopic hematuria work-up.   Bartholomew Crews, MD 11/24/201912:02 PM

## 2018-02-03 NOTE — Progress Notes (Signed)
   Subjective: Continues to endorse abdominal pain worse in the epigastric region and radiating in a bandlike pattern towards his back.  Decreased appetite, nausea, no vomiting.  Reports one bowel movement yesterday evening coated with dark red blood.  Objective:  Vital signs in last 24 hours: Vitals:   02/02/18 1718 02/02/18 2050 02/03/18 0500 02/03/18 0515  BP: (!) 141/93 (!) 140/95  (!) 141/92  Pulse: 95 (!) 103  81  Resp: (!) 24 18  18   Temp: (!) 97.5 F (36.4 C) 97.7 F (36.5 C)  98 F (36.7 C)  TempSrc: Oral Oral  Oral  SpO2: 99% 100%  100%  Weight:   90.8 kg   Height:       General: Laying in bed appears slightly uncomfortable but in no acute distress Abdominal: Mild diffuse tenderness but soft and nondistended.  Normal bowel sounds  Assessment/Plan:  Principal Problem:   Upper GI bleed Active Problems:   CKD (chronic kidney disease), stage III (HCC)   Polysubstance abuse (HCC)   Chronic systolic heart failure ([place])  67 year old man with alcohol abuse who presents with 4 weeks of abdominal pain and dark red stools.  1.  Probable upper GI bleed: Hemoglobin and vitals are stable overnight.  Continues to endorse pain.  Another episode of dark red stools yesterday evening.  Remains n.p.o. for possible intervention. -Pain control with IV Dilaudid -Continue oral PPI and Carafate, GI cocktail as needed -Consulting GI -Consider H. pylori testing -CBC daily  2.  Chronic combined heart failure: Stable.  Euvolemic/hypovolemic on exam. -Continue Entresto -Holding home Lasix in the setting of dehydration -Continue digoxin.  Will check digoxin level.  Goal therapeutic range is 0.5-0.9.  3.  Alcohol abuse: CIWA  Dispo: Anticipated discharge in approximately 1-2 day(s).   [name], MD 02/03/2018, 1:04 PM Pager: [phone]

## 2018-02-03 NOTE — Progress Notes (Signed)
CSW received a consult to speak with patient concerning resources for substance abuse.  CSW spoke with patient at bedside.  CSW gave patient resources for substance abuse..  Patient would like to go to a inpatient treatment facility.  Patient was very appreciative of the list of resources.  CSW signing off.   Reed Breech LCSWA [phone]

## 2018-02-04 ENCOUNTER — Encounter (HOSPITAL_COMMUNITY)
Admission: EM | Disposition: A | Discharge status: Home / Self Care | Payer: Self-pay | Source: Home / Self Care | Attending: Oncology

## 2018-02-04 ENCOUNTER — Encounter (HOSPITAL_COMMUNITY): Payer: Self-pay | Admitting: *Deleted

## 2018-02-04 DIAGNOSIS — F17210 Nicotine dependence, cigarettes, uncomplicated: Secondary | ICD-10-CM | POA: Diagnosis present

## 2018-02-04 DIAGNOSIS — R109 Unspecified abdominal pain: Secondary | ICD-10-CM

## 2018-02-04 DIAGNOSIS — K922 Gastrointestinal hemorrhage, unspecified: Secondary | ICD-10-CM

## 2018-02-04 DIAGNOSIS — R1013 Epigastric pain: Secondary | ICD-10-CM | POA: Diagnosis not present

## 2018-02-04 DIAGNOSIS — K269 Duodenal ulcer, unspecified as acute or chronic, without hemorrhage or perforation: Secondary | ICD-10-CM | POA: Diagnosis present

## 2018-02-04 DIAGNOSIS — Z791 Long term (current) use of non-steroidal anti-inflammatories (NSAID): Secondary | ICD-10-CM | POA: Diagnosis not present

## 2018-02-04 DIAGNOSIS — K921 Melena: Secondary | ICD-10-CM

## 2018-02-04 DIAGNOSIS — N183 Chronic kidney disease, stage 3 (moderate): Secondary | ICD-10-CM

## 2018-02-04 DIAGNOSIS — Z9119 Patient's noncompliance with other medical treatment and regimen: Secondary | ICD-10-CM | POA: Diagnosis not present

## 2018-02-04 DIAGNOSIS — B370 Candidal stomatitis: Secondary | ICD-10-CM | POA: Diagnosis present

## 2018-02-04 DIAGNOSIS — F1910 Other psychoactive substance abuse, uncomplicated: Secondary | ICD-10-CM | POA: Diagnosis present

## 2018-02-04 DIAGNOSIS — K219 Gastro-esophageal reflux disease without esophagitis: Secondary | ICD-10-CM | POA: Diagnosis present

## 2018-02-04 DIAGNOSIS — Z7982 Long term (current) use of aspirin: Secondary | ICD-10-CM | POA: Diagnosis not present

## 2018-02-04 DIAGNOSIS — R933 Abnormal findings on diagnostic imaging of other parts of digestive tract: Secondary | ICD-10-CM | POA: Diagnosis not present

## 2018-02-04 DIAGNOSIS — R1084 Generalized abdominal pain: Secondary | ICD-10-CM | POA: Diagnosis not present

## 2018-02-04 DIAGNOSIS — D72819 Decreased white blood cell count, unspecified: Secondary | ICD-10-CM

## 2018-02-04 DIAGNOSIS — I5042 Chronic combined systolic (congestive) and diastolic (congestive) heart failure: Secondary | ICD-10-CM | POA: Diagnosis present

## 2018-02-04 DIAGNOSIS — Z79899 Other long term (current) drug therapy: Secondary | ICD-10-CM | POA: Diagnosis not present

## 2018-02-04 DIAGNOSIS — I428 Other cardiomyopathies: Secondary | ICD-10-CM | POA: Diagnosis present

## 2018-02-04 DIAGNOSIS — I130 Hypertensive heart and chronic kidney disease with heart failure and stage 1 through stage 4 chronic kidney disease, or unspecified chronic kidney disease: Secondary | ICD-10-CM | POA: Diagnosis present

## 2018-02-04 DIAGNOSIS — K861 Other chronic pancreatitis: Secondary | ICD-10-CM | POA: Diagnosis not present

## 2018-02-04 DIAGNOSIS — Z8719 Personal history of other diseases of the digestive system: Secondary | ICD-10-CM

## 2018-02-04 DIAGNOSIS — E860 Dehydration: Secondary | ICD-10-CM | POA: Diagnosis not present

## 2018-02-04 DIAGNOSIS — L918 Other hypertrophic disorders of the skin: Secondary | ICD-10-CM | POA: Diagnosis present

## 2018-02-04 DIAGNOSIS — E861 Hypovolemia: Secondary | ICD-10-CM | POA: Diagnosis not present

## 2018-02-04 DIAGNOSIS — K5731 Diverticulosis of large intestine without perforation or abscess with bleeding: Secondary | ICD-10-CM | POA: Diagnosis present

## 2018-02-04 DIAGNOSIS — F1010 Alcohol abuse, uncomplicated: Secondary | ICD-10-CM | POA: Diagnosis present

## 2018-02-04 HISTORY — PX: FLEXIBLE SIGMOIDOSCOPY: SHX5431

## 2018-02-04 LAB — CBC
HEMATOCRIT: 43.9 % (ref 39.0–52.0)
HEMOGLOBIN: 14.2 g/dL (ref 13.0–17.0)
MCH: 29.6 pg (ref 26.0–34.0)
MCHC: 32.3 g/dL (ref 30.0–36.0)
MCV: 91.6 fL (ref 80.0–100.0)
Platelets: 198 10*3/uL (ref 150–400)
RBC: 4.79 MIL/uL (ref 4.22–5.81)
RDW: 15 % (ref 11.5–15.5)
WBC: 10 10*3/uL (ref 4.0–10.5)
nRBC: 0 % (ref 0.0–0.2)

## 2018-02-04 LAB — DIGOXIN LEVEL: Digoxin Level: 0.3 ng/mL — ABNORMAL LOW (ref 0.8–2.0)

## 2018-02-04 LAB — GLUCOSE, CAPILLARY: GLUCOSE-CAPILLARY: 95 mg/dL (ref 70–99)

## 2018-02-04 SURGERY — SIGMOIDOSCOPY, FLEXIBLE

## 2018-02-04 MED ORDER — SODIUM CHLORIDE 0.9 % IV SOLN
INTRAVENOUS | Status: DC
  Administered 2018-02-04: 13:00:00 via INTRAVENOUS

## 2018-02-04 NOTE — Progress Notes (Signed)
Patient sigmoidoscopy showed no evidence of blood, bleeding, or definite source of bleeding.  Please see dictated procedure report.    I suspect this is rectal outlet bleeding and, if it persists, we could consider intranasal ointments such as Anusol.  The patient indicates he has not had a bowel movement for several days.  There was minimal stool in the rectosigmoid lumen, but it was brown in color, almost certainly confirming the absence of any proximal source of bleeding.  I will ask the nurse to monitor the appearance of all stools.  Meanwhile, patient continues to have a complaint of significant abdominal pain.  He is on antipeptic therapy with both PPI and sucralfate; the only source of pain seen on his CT was evidence for duodenal ulcer.  As noted yesterday, I would be reluctant to put this patient with an ejection fraction of 10% through sedation for an upper endoscopy unless it was considered essential.  Although the patient indicates he is not hungry, I feel that it is okay for him to have a regular diet (as tolerated, not likely he will eat very much).  I would monitor on the current regimen for another couple of days and, if it appears we are getting nowhere, we could consider doing an upper GI series for further assessment of possible duodenal ulceration.  We will follow at a distance; please call us at any time if earlier input is needed.  Cleotis Nipper, M.D. Pager [phone] If no answer or after 5 PM call [phone]

## 2018-02-04 NOTE — Progress Notes (Addendum)
Attestation for Student Documentation:  [name] personally was present and performed or re-performed the history, physical exam and medical decision-making activities of this service and have verified that the service and findings are accurately documented in the student's note.  Ryan Course, MD 02/04/2018, 11:43 AM   Subjective: Patient reports pain is unchanged and denies bowel movements in the last 24 hours;  Patient also endorses shortness of breath that is unchanged and secondary to his diffuse abdominal pain.   Objective: Vital signs in last 24 hours: Vitals:   02/03/18 1423 02/03/18 2239 02/04/18 0552 02/04/18 1025  BP: (!) 155/97 133/90 113/83 116/82  Pulse: (!) 111 (!) 108 97 91  Resp: (!) 34 16 16 (!) 22  Temp:  98.5 F (36.9 C) 98 F (36.7 C) 98.2 F (36.8 C)  TempSrc:  Oral Oral Oral  SpO2: 100% 99% 100% 100%  Weight:      Height:        General: Laying in bed in right decubitus position; appears slightly uncomfortable, breathing unlabored; shallow breaths when prompted to breath deeply during pulm exam HEENT: mmm Pulm: CTAB, shallow breathing when upright.  Cardiac: RRR, no extra heart sounds, no JVD Abdominal: Mild diffuse tenderness but soft and nondistended. Active bowel sounds Extremities:  No lower extremity edema, bilaterally   Assessment/Plan: Principal Problem:   Lower GI bleed Active Problems:   CKD (chronic kidney disease), stage III (HCC)   Polysubstance abuse ([place])   Chronic systolic heart failure ([place])  Ryan [name], a 67 y.o.malewith a history of combined systolic/diastolic HF (EF 10%-25%), CKD stage III, HTN, and polysubstance abuse, who presented with approximately one month history of worsening diffuse abdominal pain with hematochezia, and this AM is hemodynamically stable with unchanged physical exam findings.   #Abdominal Pain w. Hematachezia: Pain unchanged, no bowel movements or bleeding within 24hours. GI consulted and given the  low EF, recommended against procedures that require general sedation (EGD); GI consult cites that distal inflammation or neoplasia or ischemic colitis are under consideration, else diverticulosis.  - NPO @ midnight, unsedated Flexible Sigmoidoscopy today -  high dose protonix (40mg  BID), sucralfate, gi cocktail for pain - dilaudid prn  #Chronic combined CHF: Last EF in September 2018 was 10 to 15%. Follows with cardiology, last office visit was January 16, 2018. He had been out of all of his meds. They restarted Lasix 40 mg daily, Entresto 24-26 mg twice daily, digoxin 0.125 mg daily.  Digoxin subtherapeutic at 0.3 today (Goal = 0.5-0.9), GFR >50. Outpatient cardiologist planning to restart spiro, hydral,imdurover the next few weeks and then repeat echo in 3 to 4 months once he is back on his medications.  -Continue Entresto, digoxin - No concern for digoxin toxicity, will defer management to outpatient cardiologist  - Holding home lasix while hypovlemic/euvolemic  #CKD 3: Stable (creatine baseline 1.2-1.4)  #Polysubstance Use: Reports last drink at 1am 23Nov and prior to admission endorsed 1 pint daily consumption for last 3-4 weeks. No history of seizure withdrawals. Discussed inpatient treatment facility options for substance abuse with Social Work - CIWA Protocol;   thiamine and folate daily  -  Encourage cessation   #Leukoplakia, etiology unknown: Candida under consideration, HIV negative.  - nystatin, CTM - f/u in outpatient for biopsy   #Abnormal UA: Positive for Hgb, Leukocytes, Nitrites, and Bacteria.  Patient without symptoms. Urine culture polymicrobial. Will not treat at this time.   #FEN/GI: CMP without electrolyte abnormalities yesterday  - CLD s/p flexible  sigmoidoscopy   Dispo: Possible discharge today pending GI recs.   This is a Careers information officer Note.  The care of the patient was discussed with Dr. Beryle [name] and the assessment and plan formulated with their  assistance.  Please see their attached note for official documentation of the daily encounter.   LOS: 0 days   Ryan [name], Ryan [name], Medical Student 02/04/2018, 10:37 AM

## 2018-02-04 NOTE — Op Note (Signed)
[facility] Patient Name: Ryan [name] Procedure Date : 02/04/2018 MRN: [id number] Attending [name]: Ryan [name] Date of Birth: [birth year] CSN: [id number] Age: 67 Admit Type: Inpatient Procedure:                Flexible Sigmoidoscopy Indications:              Rectal hemorrhage--rectal bleeding, abdominal pain,                            hgb 14.2 Providers:                Ryan Lobo, [name], Ryan Junes, RN, Ryan [name]                            Tech., Technician Referring [name]:              Medicines:                None Complications:            No immediate complications. Estimated Blood Loss:     Estimated blood loss: none. Procedure:                Pre-Anesthesia Assessment:                           - Prior to the procedure, a History and Physical                            was performed, and patient medications and                            allergies were reviewed. The patient's tolerance of                            previous anesthesia was also reviewed. The risks                            and benefits of the procedure and the sedation                            options and risks were discussed with the patient.                            All questions were answered, and informed consent                            was obtained. Prior Anticoagulants: The patient has                            taken aspirin, last dose was 2 days prior to                            procedure. ASA Grade Assessment: III - A patient                            with severe  systemic disease. After reviewing the                            risks and benefits, the patient was deemed in                            satisfactory condition to undergo the procedure.                           After obtaining informed consent, the scope was                            passed under direct vision. The pediatric                            Colonoscope was introduced through the anus and                      advanced to the 30 cm from the anal verge. The                            flexible sigmoidoscopy was accomplished without                            difficulty. The patient tolerated the procedure                            well. The quality of the bowel preparation was good. Scope In: Scope Out: Findings:      The perianal examination was normal. A very small skin tag was present,       but no prolapsed hemorrhoids or obvious fissures.      The digital rectal exam was normal except for tightness (increased anal       sphincter tone, not stenosis) which prevented full finger insertion due       to discomfort. Pertinent negatives include normal limited exam of the       prostate.      A small amount of solid brown stool was found in the rectum and in the       sigmoid colon.      The area from anus to sigmoid colon appeared normal.      There is no endoscopic evidence of bleeding or blood in the rectosigmoid       lumen, nor diverticula, inflammation, mass or polyps in the rectum and       in the sigmoid colon.      The retroflexed view of the distal rectum and anal verge was normal and       showed no anal or rectal abnormalities. No significant hemorrhoids were       seen. Impression:               Ryan [name] formed stool in the sigmoid colon. No blood                            present.                           -  The exam from the anus to the sigmoid colon (30                            cm) is normal.                           - No specimens collected.                           - No definite source of rectal bleeding seen on                            this exam. By history and based on current                            findings, the bleeding can quite confidently be                            attributed to a rectal outlet source (hemorrhoids                            vs. fissure). Moderate Sedation:      NONE Recommendation:           - Continue present  medications.                           - Observe stools; if bleeding persists, consider                            intra-anal Anusol.                           - Resume regular diet. Procedure Code(s):        --- Professional ---                           [id number], Sigmoidoscopy, flexible; diagnostic,                            including collection of specimen(s) by brushing or                            washing, when performed (separate procedure) Diagnosis Code(s):        --- Professional ---                           K62.5, Hemorrhage of anus and rectum CPT copyright 2018 American Medical Association. All rights reserved. The codes documented in this report are preliminary and upon coder review may  be revised to meet current compliance requirements. Ryan Lobo, [name] 02/04/2018 11:46:16 AM This report has been signed electronically. Number of Addenda: 0

## 2018-02-04 NOTE — Progress Notes (Signed)
Medicine attending: I examined this patient today and I agree with the evaluation and management plan as recorded by resident physician Dr. Francesco Runner and medical student Ms. Chika Chukwu. 67 year old man with known substance abuse including alcohol who presented on November 23 with diffuse abdominal pain and question of hematochezia versus melena.  Prior history of pancreatitis.  He denies previous peptic ulcer disease but CT of the abdomen done on admission shows thickening of the duodenal wall with increased enhancement suspicious for duodenal ulcer disease. He had a flexible sigmoidoscopy done this morning.  Please see detailed note by Dr. Cristina Gong.  No lower GI source of bleeding was found. He will continue antiulcer therapy with Carafate and a PPI.  Advance diet as tolerated. He is not felt to be a optimal candidate for upper endoscopy that would require parenteral sedation in view of his advanced cardiomyopathy with estimated ejection fraction 10-15% on most recent September 2018 echocardiogram.  Grade 2 diastolic dysfunction as well on that study.

## 2018-02-04 NOTE — Interval H&P Note (Signed)
History and Physical Interval Note:  02/04/2018 11:24 AM  Ryan [name]  has presented today for surgery, with the diagnosis of hematochezia  The various methods of treatment have been discussed with the patient. After consideration of risks, benefits and other options for treatment, the patient has consented to  Procedure(s): FLEXIBLE SIGMOIDOSCOPY (N/A) as a surgical intervention .  The patient's history has been reviewed, patient examined, no change in status, stable for surgery.  I have reviewed the patient's chart and labs.  Questions were answered to the patient's satisfaction.     [name]

## 2018-02-05 ENCOUNTER — Encounter (HOSPITAL_COMMUNITY): Payer: Self-pay | Admitting: Gastroenterology

## 2018-02-05 ENCOUNTER — Encounter (HOSPITAL_COMMUNITY): Payer: Self-pay

## 2018-02-05 ENCOUNTER — Other Ambulatory Visit (HOSPITAL_COMMUNITY): Payer: Medicare Other

## 2018-02-05 ENCOUNTER — Encounter (HOSPITAL_COMMUNITY)
Admission: EM | Disposition: A | Discharge status: Home / Self Care | Payer: Self-pay | Source: Home / Self Care | Attending: Oncology

## 2018-02-05 DIAGNOSIS — R1013 Epigastric pain: Secondary | ICD-10-CM

## 2018-02-05 DIAGNOSIS — I428 Other cardiomyopathies: Secondary | ICD-10-CM

## 2018-02-05 HISTORY — PX: ESOPHAGOGASTRODUODENOSCOPY: SHX5428

## 2018-02-05 LAB — CBC
HCT: 40.6 % (ref 39.0–52.0)
Hemoglobin: 12.7 g/dL — ABNORMAL LOW (ref 13.0–17.0)
MCH: 28.8 pg (ref 26.0–34.0)
MCHC: 31.3 g/dL (ref 30.0–36.0)
MCV: 92.1 fL (ref 80.0–100.0)
Platelets: 190 10*3/uL (ref 150–400)
RBC: 4.41 MIL/uL (ref 4.22–5.81)
RDW: 15 % (ref 11.5–15.5)
WBC: 9.8 10*3/uL (ref 4.0–10.5)
nRBC: 0 % (ref 0.0–0.2)

## 2018-02-05 SURGERY — EGD (ESOPHAGOGASTRODUODENOSCOPY)
Anesthesia: Topical

## 2018-02-05 MED ORDER — DEXTROSE-NACL 5-0.45 % IV SOLN
INTRAVENOUS | Status: AC
  Administered 2018-02-05: 15:00:00 via INTRAVENOUS

## 2018-02-05 MED ORDER — ALUM & MAG HYDROXIDE-SIMETH 200-200-20 MG/5ML PO SUSP: 30.0000 mL | ORAL | Status: DC | PRN

## 2018-02-05 MED ORDER — BUTAMBEN-TETRACAINE-BENZOCAINE 2-2-14 % EX AERO
INHALATION_SPRAY | CUTANEOUS | Status: DC | PRN
  Administered 2018-02-05: 2 via TOPICAL

## 2018-02-05 MED ORDER — LIDOCAINE VISCOUS HCL 2 % MT SOLN: 15.0000 mL | OROMUCOSAL | Status: DC | PRN

## 2018-02-05 MED ORDER — SODIUM CHLORIDE 0.9 % IV SOLN: INTRAVENOUS | Status: DC

## 2018-02-05 NOTE — Op Note (Signed)
[facility] Patient Name: Ryan [name] Procedure Date : 02/05/2018 MRN: [id number] Attending MD: Ronald Lobo , MD Date of Birth: 04-21-1950 CSN: [id number] Age: 67 Admit Type: Inpatient Procedure:                Upper GI endoscopy Indications:              Generalized abdominal pain, Abnormal CT of the GI                            tract showing proximal duodenal thickening with                            periduodenal stranding suggestive of ulcer disease Providers:                Ronald Lobo, MD, Cleda Daub, RN, Cherylynn Ridges, Technician Referring MD:              Medicines:                Cetacaine spray--NO IV SEDATION because of                            patient's EF of 88% Complications:            No immediate complications. Estimated Blood Loss:     Estimated blood loss: none. Procedure:                Pre-Anesthesia Assessment:                           - Prior to the procedure, a History and Physical                            was performed, and patient medications and                            allergies were reviewed. The patient's tolerance of                            previous anesthesia was also reviewed. The risks                            and benefits of the procedure and the sedation                            options and risks were discussed with the patient.                            All questions were answered, and informed consent                            was obtained. Prior Anticoagulants: The patient has  taken aspirin, last dose was 7 days prior to                            procedure. ASA Grade Assessment: III - A patient                            with severe systemic disease. After reviewing the                            risks and benefits, the patient was deemed in                            satisfactory condition to undergo the procedure.                           After  obtaining informed consent, the endoscope was                            passed under direct vision. Throughout the                            procedure, the patient's blood pressure, pulse, and                            oxygen saturations were monitored continuously. The                            [id number]-[id number] ([id number]) Olympus Ultraslim EGD was                            introduced through the mouth, and advanced to the                            second part of duodenum. The upper GI endoscopy was                            accomplished without difficulty. The patient                            tolerated the procedure well. Scope In: Scope Out: Findings:      The larynx was normal.      The examined esophagus was normal.      The entire examined stomach was normal.      The cardia and gastric fundus were normal on retroflexion.      Moderately congested mucosa without active bleeding and with no stigmata       of bleeding was found in the second portion of the duodenum. Careful       inspection disclosed no duodenal ulcer.      The exam of the duodenum was otherwise normal. Impression:               - Normal larynx.                           - Normal esophagus.                           -  Normal stomach.                           - Congested duodenal mucosa (nonspecific finding).                           - No specimens collected. Recommendation:           - Continue present medications. Procedure Code(s):        --- Professional ---                           [phone], Esophagogastroduodenoscopy, flexible,                            transoral; diagnostic, including collection of                            specimen(s) by brushing or washing, when performed                            (separate procedure) Diagnosis Code(s):        --- Professional ---                           R10.84, Generalized abdominal pain CPT copyright 2018 American Medical Association. All rights reserved. The  codes documented in this report are preliminary and upon coder review may  be revised to meet current compliance requirements. Ronald Lobo, MD 02/05/2018 2:19:42 PM This report has been signed electronically. Number of Addenda: 0

## 2018-02-05 NOTE — Progress Notes (Signed)
Medicine attending: I examined this patient today and I concur with the evaluation and management plan as recorded by resident physician Dr. Francesco Runner and third-year medical student Ms Leodis [place]. Patient still complaining of abdominal discomfort.  On our exam, mild tenderness in the epigastric region.  Abdomen otherwise soft and nontender.  CT scan suggest duodenal ulcer as etiology of his symptoms.  Sigmoidoscopy did not reveal a source of bleeding in the sigmoid colon or rectum. We appreciate ongoing GI input.  He will have a upper endoscopy without sedation using a pediatric scope today to determine whether he does in fact have an ulcer, Helicobacter infection, or occult gastric malignancy. Minimal change in hemoglobin.  12.7 g today. Renal function improved with gentle hydration and holding diuretics.

## 2018-02-05 NOTE — Progress Notes (Addendum)
   Attestation for Student Documentation:  I personally was present and performed or re-performed the history, physical exam and medical decision-making activities of this service and have verified that the service and findings are accurately documented in the student's note.  [name], MD 02/05/2018, 10:22 AM   Subjective: Mr. Grose continues to report that he does not have much of an appetite because of his diffuse abdominal pain. Denies bloody BMs.    Objective: Vital signs in last 24 hours: Vitals:   02/04/18 1436 02/04/18 2150 02/05/18 0253 02/05/18 0607  BP: 101/68 106/63 114/77 97/75  Pulse: 97 (!) 101 92 94  Resp: 20 16 18 17   Temp: 97.7 F (36.5 C) 98.7 F (37.1 C) 98.3 F (36.8 C) 98.8 F (37.1 C)  TempSrc: Oral Oral Oral   SpO2: 100% 99% 99% 100%  Weight:      Height:        Intake/Output Summary (Last 24 hours) at 02/05/2018 0959 Last data filed at 02/04/2018 1506 Gross per 24 hour  Intake 42.32 ml  Output -  Net 42.32 ml   General: Laying in bed in right decubitus position; somnolent but easily arousable, breathing unlabored HEENT: scrapable nonhomogeneous  white plaques on tongue; slightly improved from plaques seen on admission. Oropharynx clear  Cardiac: RRR, no extra heart sounds, no JVD Abdominal: Mild diffuse tenderness without guarding or rebound, most tender in mid epigastric region; soft and nondistended, Extremities:  No lower extremity edema, bilaterally  Assessment/Plan: Principal Problem:   Lower GI bleed Active Problems:   CKD (chronic kidney disease), stage III (HCC)   Polysubstance abuse (HCC)   Chronic combined systolic and diastolic CHF (congestive heart failure) ([place])   Duodenal ulcer   Hematochezia   Ryan [name], a 67 y.o.malewith a history of combined systolic/diastolic HF (EF 49%-44%), CKD stage III, HTN, and polysubstance abuse, who presented with approximately one month history of worsening diffuse abdominal  pain with hematochezia, is found to have  low hemoglobin this AM, but remains hemodynamically stable with unchanged physical exam findings.  #Abdominal Pain w. Hematachezia: Pain unchanged despite high dose PPI and carafate. No further bleeding and hemoglobin is relatively unchanged from admission.  - NPO for EGD, GI will try using a pediatric scope which doesn't require sedation. If this is not tolerated by the patient, GI will do upper GI series  - continue high dose PPI, carafate, IV dilaudid   #Chronic combined CHF, stable: EF(10 to 15%). Continue home meds (Entresto and Digoxin). Holding home lasix while hypovlemic/euvolemic - hypovolemic on exam, decreased po intake for the last week. Gentle fluids D5 1/2 NS 75cc/hr for 10 hours  #CKD 3:Stable (creatine baseline 1.2-1.4) - repeat BMP tomorrow  #Polysubstance Use:  - CIWA Protocol;   thiamine and folate daily   #Leukoplakia, etiology unknown:  Scrapable and slightly improved  Today.  Candida under consideration, HIV negative.  - nystatin, CTM - f/u in outpatient for biopsy    Dispo: Possible discharge in 1 day pending GI studies and ability to tolerate po  This is a Careers information officer Note.  The care of the patient was discussed with Dr. Beryle Beams  and the assessment and plan formulated with their assistance.  Please see their attached note for official documentation of the daily encounter.   LOS: 1 day   Chukwu, Heide Spark I, Medical Student 02/05/2018, 9:59 AM

## 2018-02-05 NOTE — Progress Notes (Signed)
Patient's upper endoscopy was normal apart from some proximal duodenal mucosal edema, possibly a consequence of his recent alcohol binge.  No clear explanation for his symptoms of rather diffuse, generalized abdominal pain.  Would continue empiric PPI therapy but no additional suggestions for diagnostic or therapeutic interventions at this time.  In view of the absence of ulcer disease, I feel it is appropriate to stop his sucralfate (ordered).  He may have a heart healthy diet (ordered).  Cleotis Nipper, M.D. Pager [phone] If no answer or after 5 PM call [phone]

## 2018-02-05 NOTE — Progress Notes (Addendum)
There is continued fluctuation in the patient's hemoglobin, presumably due to laboratory variation, but no downward trend.  His abdominal pain persists without evident improvement.  It is across the mid abdomen, not really in the midepigastric area as might be expected from a duodenal ulcer as has been suggested by his CT findings.  In my experience, there are occasional patients with ulcer disease you have persistent pain despite appropriate treatment; perhaps the ulcer has eroded into some sort of visceral efferent nerve.  On exam, the abdomen is nontender.  Given the absence of definite, noticeable clinical improvement despite several days of aggressive antipeptic therapy, I think it is reasonable to do further imaging of the GI tract.  The options are an upper GI series or an unsedated upper endoscopy using the ultraslim pediatric endoscope.  Each of these has certain advantages and disadvantages.  They are also not mutually exclusive.  I have spoken with Dr. Francesco Runner of the internal medicine teaching service, and they have a preference for endoscopic evaluation over an upper GI series, and this is also the patient's preference as well.  I will see if we can get him on for an upper endoscopy later today.  He has not had breakfast.  Ryan [name], M.D. Pager [phone] If no answer or after 5 PM call [phone]

## 2018-02-05 NOTE — Progress Notes (Signed)
Patient had 1 run of 15 beats of Vtach. VSS, No complaints of chest pain or respiratory distress.  Paged oncall MD awaiting callback. Will continue to monitor.

## 2018-02-05 NOTE — Interval H&P Note (Signed)
History and Physical Interval Note:  02/05/2018 1:51 PM  Ryan [name]  has presented today for surgery, with the diagnosis of Generalized abdominal pain, abnormal CT  The various methods of treatment have been discussed with the patient. After consideration of risks, benefits and other options for treatment, the patient has consented to  Procedure(s) with comments: ESOPHAGOGASTRODUODENOSCOPY (EGD) (N/A) - This patient has severe heart failure.  We will use pharyngeal anesthesia, but no sedation, and plan to use the ultraslim pediatric upper endoscope. as a surgical intervention .  The patient's history has been reviewed, patient examined, no change in status, stable for surgery.  I have reviewed the patient's chart and labs.  Questions were answered to the patient's satisfaction.     Ryan [name]

## 2018-02-06 DIAGNOSIS — I428 Other cardiomyopathies: Secondary | ICD-10-CM

## 2018-02-06 DIAGNOSIS — K861 Other chronic pancreatitis: Secondary | ICD-10-CM

## 2018-02-06 DIAGNOSIS — B370 Candidal stomatitis: Secondary | ICD-10-CM

## 2018-02-06 DIAGNOSIS — Z8719 Personal history of other diseases of the digestive system: Secondary | ICD-10-CM

## 2018-02-06 LAB — BASIC METABOLIC PANEL
ANION GAP: 7 (ref 5–15)
BUN: 12 mg/dL (ref 8–23)
CALCIUM: 8.8 mg/dL — AB (ref 8.9–10.3)
CO2: 26 mmol/L (ref 22–32)
Chloride: 104 mmol/L (ref 98–111)
Creatinine, Ser: 1.14 mg/dL (ref 0.61–1.24)
GLUCOSE: 103 mg/dL — AB (ref 70–99)
Potassium: 3.8 mmol/L (ref 3.5–5.1)
Sodium: 137 mmol/L (ref 135–145)

## 2018-02-06 LAB — CBC
HCT: 41.9 % (ref 39.0–52.0)
Hemoglobin: 13.1 g/dL (ref 13.0–17.0)
MCH: 29.1 pg (ref 26.0–34.0)
MCHC: 31.3 g/dL (ref 30.0–36.0)
MCV: 93.1 fL (ref 80.0–100.0)
NRBC: 0 % (ref 0.0–0.2)
PLATELETS: 192 10*3/uL (ref 150–400)
RBC: 4.5 MIL/uL (ref 4.22–5.81)
RDW: 15.4 % (ref 11.5–15.5)
WBC: 7.2 10*3/uL (ref 4.0–10.5)

## 2018-02-06 MED ORDER — PANTOPRAZOLE SODIUM 40 MG PO TBEC: 40.0000 mg | DELAYED_RELEASE_TABLET | Freq: Two times a day (BID) | ORAL | 0 refills | Status: DC

## 2018-02-06 MED FILL — PANTOPRAZOLE SOD DR 40 MG T: 40 | 45 days supply | Qty: 90 | Fill #0

## 2018-02-06 NOTE — Progress Notes (Signed)
[name] to be D/C'd to home bper MD order. Discussed with the patient and all questions fully answered.   VVS, Skin clean, dry and intact without evidence of skin break down, no evidence of skin tears noted.  IV catheter discontinued intact. Site without signs and symptoms of complications. Dressing and pressure applied.  An After Visit Summary was printed and given to the patient.  Patient escorted via [place], and D/C home via private auto.  Jordan Hawks A Lorianne Malbrough  02/06/2018 1:10 PM

## 2018-02-06 NOTE — Progress Notes (Signed)
Patient had 7 beat run of Vtach. Patient said "His stomach was still acting up." when I checked on him. When asked if he felt dizzy he said yes on and off. I asked if he felt dizzy in the last 5 minutes he said yes. Dr. Darlyn Chamber will continue to monitor.

## 2018-02-06 NOTE — Progress Notes (Addendum)
  Attestation for Student Documentation:  I personally was present and performed or re-performed the history, physical exam and medical decision-making activities of this service and have verified that the service and findings are accurately documented in the student's note.  [name], MD 02/06/2018, 11:17 AM [phone]   Subjective: [name] reports that he was brought a [place] sandwich yesterday evening and he was able to ingest a few bites. This morning the patient reports a small amount of bright red bloody stool without pain.  First bowel movement since admission.   Objective: Vital signs in last 24 hours: Vitals:   02/05/18 1528 02/05/18 2047 02/06/18 0428 02/06/18 0836  BP: 110/70 106/71 107/67   Pulse: 85 92 91 88  Resp: 16 18 17    Temp: 97.6 F (36.4 C) 97.7 F (36.5 C) 98.5 F (36.9 C)   TempSrc: Oral  Oral   SpO2: 100% 100% 98%   Weight:      Height:       General: supine, in nad Cardiac: RRR, nl S1, S2 Abdominal, soft and non-distended. Mild tenderness to palpation diffusely without rebound and guarding Extremities: appropriate tone without lower extremity edema   Assessment/Plan: Principal Problem:   Lower GI bleed Active Problems:   CKD (chronic kidney disease), stage III (HCC)   Polysubstance abuse (HCC)   Chronic combined systolic and diastolic CHF (congestive heart failure) (HCC)   Duodenal ulcer   Hematochezia   Nonischemic cardiomyopathy ([place])  Ortencia Kick Colaizzi,a 67 y.o.malewith a history of combined systolic/diastolic HF (EF 14%-78%), CKD stage III, HTN,andpolysubstance abuse,whopresentedwith approximately one month history of worsening diffuse abdominal pain with hematochezia, found to be  euvolemic on exam,  hemodynamically stable, and without a definite source for  bleeding or pain on imaging is a good candidate for discharge today.    #Abdominal Pain: Concern for duodenal ulcer based on CT scan on admission. 26Nov Upper Endoscopy  conveyed congested duodenal mucosa with normal larynx, esophagus, and stomach.  Pain has been unchanged. PO has been relatively unchanged -counseled patient about avoiding agents that can illicit more pain (alcohol, spicy food, etc), and to - f/u with GI outpt  #Hematachezia:+FOBT in ED. 25Nov Flex Sigmoidoscopy conveyed no definite source of bleeding and normal anus with brown stool. 26Nov upper endoscopy as above.  Hemoglobin was relatively unchanged from admission.   - continue high dose PPI  #Chronic combined CHF, stable: EF(10 to 15%).  Decreased PO during hospitalization.  Holding home lasixwhile hypovlemic/euvolemic. 2 documented, self-resolving runs of VTach ( 15 beats and 7 beats respectively) in 48hours. - Continue home meds (Entresto and Digoxin).  - D5 1/2 NS routinely   Dispo: Good candidate for discharge today.  This is a Careers information officer Note.  The care of the patient was discussed with Dr. Beryle Beams  and the assessment and plan formulated with their assistance.  Please see their attached note for official documentation of the daily encounter.   LOS: 2 days   Chukwu, Heide Spark I, Medical Student 02/06/2018, 9:24 AM

## 2018-02-06 NOTE — Progress Notes (Signed)
Medicine attending discharge note: I personally examined this patient on the day of discharge and I attest to the accuracy of the discharge evaluation and plan as recorded in the final progress note by resident physician Dr Francesco Runner and medical student Ms.Chika Chokwu and will be further detailed in the discharge summary by Dr. Donne Hazel. 67 year old man, known polysubstance abuser including alcohol.  He has an advanced nonischemic cardiomyopathy with estimated ejection fraction 10-15% on a September 2018 echocardiogram. He also gives a history of chronic pancreatitis. He presented on the day of the current admission November 23 with acute onset of diffuse upper abdominal pain.  He thought this was his pancreatitis. In addition he noted intermittent hematochezia.  This was new for him.  No prior history of hemorrhoids or diverticulitis. A CT scan of the abdomen did not show evidence for acute pancreatitis.  There was a suggestion of a duodenal ulcer with thickening of the duodenal bulb and mucosal enhancement.  Blood lipase level not elevated at 40. Gastroenterology consultation obtained.  Initial attention directed to the lower GI tract.  Parenteral sedation avoided in view of his advanced cardiomyopathy.  He had a flexible sigmoidoscopy which showed no major pathology or source for his bleeding.  No hemorrhoids.  No diverticulosis.  No other pathology.  Residual stool in the sigmoid noted to be brown. He underwent an upper endoscopy with a pediatric scope.  No pathology found in the esophagus stomach or duodenum. His pain subsided with conservative management. Incidentally noted on initial exam was Candida.  He was treated with nystatin mouthwash.  HIV screen was negative. Admission hemoglobin 13.7.  Discharge hemoglobin 13.1.  Disposition: Condition stable at time of discharge There were no complications

## 2018-02-06 NOTE — Discharge Summary (Signed)
Name: Ryan [name] MRN: [id number] DOB: 09/06/1950 67 y.o. PCP: [name], MD  Date of Admission: 02/02/2018  8:28 AM Date of Discharge: 02/06/18 Attending Physician: Annia Belt, MD  Discharge Diagnosis: 1.   Discharge Medications: Allergies as of 02/06/2018   No Known Allergies     Medication List    STOP taking these medications   aspirin 81 MG EC tablet   diclofenac 75 MG EC tablet Commonly known as:  VOLTAREN   omeprazole 20 MG capsule Commonly known as:  PRILOSEC     TAKE these medications   acetaminophen 325 MG tablet Commonly known as:  TYLENOL Take 650 mg by mouth every 6 (six) hours as needed for mild pain.   diclofenac sodium 1 % Gel Commonly known as:  VOLTAREN Apply 1 application topically 2 (two) times daily.   digoxin 0.125 MG tablet Commonly known as:  LANOXIN Take 1 tablet (0.125 mg total) by mouth daily.   furosemide 40 MG tablet Commonly known as:  LASIX Take 1 tablet (40 mg total) by mouth daily.   gabapentin 300 MG capsule Commonly known as:  NEURONTIN Take 300 mg by mouth 3 (three) times daily.   Investigational - Study Medication Take 1 tablet by mouth 2 (two) times daily. Study name: Galactic HF Study Additional study details: Omecamtiv Mecarbil or Placebo   LORazepam 1 MG tablet Commonly known as:  ATIVAN Take 1 tablet (1 mg total) by mouth 3 (three) times daily as needed for anxiety.   pantoprazole 40 MG tablet Commonly known as:  PROTONIX Take 1 tablet (40 mg total) by mouth 2 (two) times daily.   sacubitril-valsartan 24-26 MG Commonly known as:  ENTRESTO Take 1 tablet by mouth 2 (two) times daily.       Disposition and follow-up:   Ryan [name] was discharged from [facility] in Good condition.  At the hospital follow up visit please address:  Follow Up Issues for Outpatient Provider - Source of GI bleeding is unclear, f/u anymore bleeding and refer for colonoscopy if  indicated - [place] discharged on high dose acid blocker (protonix) - Medical team feels he would benefit greatly from inpatient rehabilitation for polysubstance abuse  2.  Labs / imaging needed at time of follow-up: If continued GI bleeding, consider repeat CBC. Hemoglobin was normal and stable during admission.  3.  Pending labs/ test needing follow-up: none   Follow-up Appointments: Follow-up Information    [name], MD Follow up in 2 week(s).   Specialty:  Internal Medicine Contact information: [place] [address] [phone]        Ronald Lobo, MD Follow up in 3 week(s).   Specialty:  Gastroenterology Why:  If symptoms do not improve Contact information: [address] [place] [address] [phone]           HPI: 67 y.o. male with a medical history of combined systolic/diastolic HF (EF 85%-46%), stage III chronic kidney disease, hypertension , and polysubstance abuse, who presented with approximately a one month history of worsening diffuse abdominal pain in the setting of hematochezia.   Hospital Course His abdominal pain slightly improved throughout the course of his hospital stay with administration of high dose PPI and Carafate. Lipase was normal at 40. Hemoglobin also remained relatively unchanged throughout the course of hospitalization. All procedures (flex sig & EGD) were largely unremarkable for a definite source of pain and bleeding and they were completed without sedation  due to concerns about general sedation with a low ejection fraction. No evidence of hemorrhoids or diverticulitis on flex sig.  If his bleeding continues or worsens, the risk and benefit of colonoscopy with general sedation can be discussed.   Incidentally noted candida was treated with oral nystatin. HIV was negative.   He remained hypovolemic/euvolemic on physical exam during his hospital course and was perfused gently with D5  normal saline.  His furosemide was held in the setting of being NPO.   CKD was managed without complication.    Discharge Vitals:   BP 107/67 (BP Location: Right Arm)   Pulse 88   Temp 98.5 F (36.9 C) (Oral)   Resp 17   Ht 5\' 11"  (1.803 m)   Wt 90.8 kg   SpO2 98%   BMI 27.92 kg/m   Pertinent Labs, Studies, and Procedures:   Admission hemoglobin 13.7. Discharge hemoglobin 13.1  26th November EGD - Normal larynx. - Normal esophagus. - Normal stomach. - Congested duodenal mucosa (nonspecific finding).  25 November Flexible Sigmoidoscopy Brown formed stool in the sigmoid colon. No blood present. - The exam from the anus to the sigmoid colon (30 cm) is normal. - No specimens collected. - No definite source of rectal bleeding seen on this exam.  23 November CT Abdomen/Pelvis IMPRESSION: Duodenal wall thickening with duodenal mucosal hyperenhancement and surrounding peri duodenal infiltrative changes extending to the pancreatic head, favor duodenal ulcer disease over acute pancreatitis, recommend correlation with serum amylase and consider upper endoscopy. Nonobstructing renal calculi. Distal colonic diverticulosis. Posttraumatic deformities of the LEFT hemipelvis and proximal LEFT femur. Aortic Atherosclerosis Electronically Signed By: Lavonia Dana M.D. On: 02/02/2018 11:33   Discharge Instructions: Discharge Instructions    Diet - low sodium heart healthy   Complete by:  As directed    Discharge instructions   Complete by:  As directed    Ryan [name],   You admitted to the hospital for evaluation of your abdominal pain and bloody stools.  I think that your abdominal pain is coming from some irritation in your stomach. Thankfully, we didn't see any evidence of cancer in your stomach and the irritation is not bad enough to have caused a visible ulcer but you should start taking Protonix 40 mg twice a day to help decrease the acid/irritation in your stomach. It is also best to abstain  from acidic foods and drinks such as alcohol and coffee.  Please also stop taking aspirin and Voltaren pills.  You should also stay away from ibuprofen/Advil/Motrin.  For your bloody stools, it seems that the bleeding is coming from hemorrhoids.  Your blood levels have been stable and the bleeding has stopped.  The best thing to do now is avoid constipation.  Try to have one bowel movement every day.  You can take over-the-counter laxatives or drink prune juice to help you go to the bathroom.  If the bleeding or pain worsens, please see a doctor immediately.  Please follow-up with your primary care doctor in the next 1 to 2 weeks.  I will provide you with the GI doctor's clinic contact information if you would like to schedule a hospital follow-up appointment with them.   Increase activity slowly   Complete by:  As directed       Signed: [name], MD 02/06/2018, 12:30 PM   Pager: [phone]

## 2018-02-06 NOTE — Consult Note (Signed)
            [facility] CM Primary Care Navigator  02/06/2018  Ryan [name] [birth year] [id number]   Went to seepatient at the bedsideto identify possible discharge needs buthe was alreadydischarged home per staff report.   Per MD note, patientpresented with acute onset of diffuse upper abdominal pain with intermittent hematochezia. (lower GI bleed, hematochezia, duodenal ulcer). All procedures (flex sigmoidoscopy & EGD- esophagogastroduodenoscopy) were largely unremarkable.  Patient hasdischarge instruction to follow-up withprimary care provider in 2 weeks and gastroenterology follow-up in 3 weeks.    For additional questions please contact:  Edwena Felty A. Emagene Merfeld, BSN, RN-BC [facility] PRIMARY CARE Navigator Cell: [phone]

## 2018-02-06 NOTE — Discharge Instructions (Signed)
Peptic Ulcer  A peptic ulcer is a sore in the lining of the esophagus (esophageal ulcer), the stomach (gastric ulcer), or the first part of the small intestine (duodenal ulcer). The ulcer causes gradual wearing away (erosion) into the deeper tissue.  What are the causes?  Normally, the lining of the stomach and the small intestine protects itself from the acid that digests food. The protective lining can be damaged by:   An infection caused by a germ (bacterium) called Helicobacter pylori or H. pylori.   Regular use of NSAIDs, such as ibuprofen or aspirin.   Rare tumors in the stomach, small intestine, or pancreas (Zollinger-Ellison syndrome).    What increases the risk?  The following factors may make you more likely to develop this condition:   Smoking.   Having a family history of ulcer disease.    What are the signs or symptoms?  Symptoms of this condition include:   Burning pain or gnawing in the area between the chest and the belly button. The pain may be worse on an empty stomach and at night.   Heartburn.   Nausea and vomiting.   Bloating.    If the ulcer results in bleeding, it can cause:   Black, tarry stools.   Vomiting of bright red blood.   Vomiting of material that looks like coffee grounds.    How is this diagnosed?  This condition may be diagnosed based on:   Medical history and physical exam.   Various tests or procedures, such as:  ? Blood tests, stool tests, or breath tests to check for the H. pylori bacterium.  ? An X-ray exam (upper gastrointestinal series) of the esophagus, stomach, and small intestine.  ? Upper endoscopy. The health care provider examines the esophagus, stomach, and small intestine using a small flexible tube that has a video camera at the end.  ? Biopsy. A tissue sample is removed to be examined under a microscope.    How is this treated?  Treatment for this condition may include:   Eliminating the cause of the ulcer, such as smoking or the use of NSAIDs or  alcohol.   Medicines to reduce the amount of acid in your digestive tract.   Antibiotic medicines, if the ulcer is caused by the H. pylori bacterium.   An upper endoscopy to treat a bleeding ulcer.   Surgery, if the bleeding is severe or if the ulcer created a hole somewhere in the digestive system.    Follow these instructions at home:   Avoid alcohol and caffeine.   Do not use any tobacco products, such as cigarettes, chewing tobacco, and e-cigarettes. If you need help quitting, ask your health care provider.   Take over-the-counter and prescription medicines only as told by your health care provider. Do not use over-the-counter medicines in place of prescription medicines unless your health care provider approves.   Keep all follow-up visits as told by your health care provider. This is important.  Contact a health care provider if:   Your symptoms do not improve within 7 days of starting treatment.   You have ongoing indigestion or heartburn.  Get help right away if:   You have sudden, sharp, or persistent pain in your abdomen.   You have bloody or dark black, tarry stools.   You vomit blood or material that looks like coffee grounds.   You become light-headed or you feel faint.   You become weak.   You become sweaty or clammy.    This information is not intended to replace advice given to you by your health care provider. Make sure you discuss any questions you have with your health care provider.  Document Released: 02/25/2000 Document Revised: 08/02/2015 Document Reviewed: 11/28/2014  Elsevier Interactive Patient Education  2018 [employer]vier Inc[employer]

## 2018-02-13 ENCOUNTER — Inpatient Hospital Stay (HOSPITAL_COMMUNITY)
Admission: RE | Admit: 2018-02-13 | Discharge: 2018-02-13 | Disposition: A | Discharge status: Home / Self Care | Payer: Medicare Other | Source: Ambulatory Visit

## 2018-03-05 ENCOUNTER — Inpatient Hospital Stay (HOSPITAL_COMMUNITY)
Admission: EM | Admit: 2018-03-05 | Discharge: 2018-03-08 | DRG: 394 | Disposition: A | Discharge status: Home / Self Care | Payer: Medicare Other | Attending: Student in an Organized Health Care Education/Training Program | Admitting: Student in an Organized Health Care Education/Training Program

## 2018-03-05 ENCOUNTER — Other Ambulatory Visit: Payer: Self-pay

## 2018-03-05 ENCOUNTER — Encounter (HOSPITAL_COMMUNITY): Payer: Self-pay | Admitting: Emergency Medicine

## 2018-03-05 ENCOUNTER — Inpatient Hospital Stay (HOSPITAL_COMMUNITY): Payer: Medicare Other

## 2018-03-05 DIAGNOSIS — M545 Low back pain: Secondary | ICD-10-CM | POA: Diagnosis present

## 2018-03-05 DIAGNOSIS — D122 Benign neoplasm of ascending colon: Secondary | ICD-10-CM | POA: Diagnosis present

## 2018-03-05 DIAGNOSIS — F17210 Nicotine dependence, cigarettes, uncomplicated: Secondary | ICD-10-CM | POA: Diagnosis not present

## 2018-03-05 DIAGNOSIS — K922 Gastrointestinal hemorrhage, unspecified: Secondary | ICD-10-CM | POA: Diagnosis not present

## 2018-03-05 DIAGNOSIS — I5042 Chronic combined systolic (congestive) and diastolic (congestive) heart failure: Secondary | ICD-10-CM | POA: Diagnosis not present

## 2018-03-05 DIAGNOSIS — K648 Other hemorrhoids: Secondary | ICD-10-CM | POA: Diagnosis present

## 2018-03-05 DIAGNOSIS — Z720 Tobacco use: Secondary | ICD-10-CM | POA: Diagnosis not present

## 2018-03-05 DIAGNOSIS — K5730 Diverticulosis of large intestine without perforation or abscess without bleeding: Secondary | ICD-10-CM | POA: Diagnosis present

## 2018-03-05 DIAGNOSIS — N183 Chronic kidney disease, stage 3 unspecified: Secondary | ICD-10-CM | POA: Diagnosis present

## 2018-03-05 DIAGNOSIS — K921 Melena: Secondary | ICD-10-CM | POA: Diagnosis not present

## 2018-03-05 DIAGNOSIS — K635 Polyp of colon: Secondary | ICD-10-CM | POA: Diagnosis not present

## 2018-03-05 DIAGNOSIS — G8929 Other chronic pain: Secondary | ICD-10-CM | POA: Diagnosis present

## 2018-03-05 DIAGNOSIS — Z833 Family history of diabetes mellitus: Secondary | ICD-10-CM

## 2018-03-05 DIAGNOSIS — R000 Tachycardia, unspecified: Secondary | ICD-10-CM | POA: Diagnosis not present

## 2018-03-05 DIAGNOSIS — R0600 Dyspnea, unspecified: Secondary | ICD-10-CM

## 2018-03-05 DIAGNOSIS — K621 Rectal polyp: Secondary | ICD-10-CM | POA: Diagnosis present

## 2018-03-05 DIAGNOSIS — J449 Chronic obstructive pulmonary disease, unspecified: Secondary | ICD-10-CM | POA: Diagnosis present

## 2018-03-05 DIAGNOSIS — K625 Hemorrhage of anus and rectum: Secondary | ICD-10-CM | POA: Diagnosis not present

## 2018-03-05 DIAGNOSIS — K649 Unspecified hemorrhoids: Secondary | ICD-10-CM | POA: Clinically undetermined

## 2018-03-05 DIAGNOSIS — Z79899 Other long term (current) drug therapy: Secondary | ICD-10-CM | POA: Diagnosis not present

## 2018-03-05 DIAGNOSIS — J929 Pleural plaque without asbestos: Secondary | ICD-10-CM | POA: Diagnosis not present

## 2018-03-05 DIAGNOSIS — I428 Other cardiomyopathies: Secondary | ICD-10-CM | POA: Diagnosis present

## 2018-03-05 DIAGNOSIS — Z791 Long term (current) use of non-steroidal anti-inflammatories (NSAID): Secondary | ICD-10-CM | POA: Diagnosis not present

## 2018-03-05 DIAGNOSIS — D124 Benign neoplasm of descending colon: Secondary | ICD-10-CM | POA: Diagnosis present

## 2018-03-05 DIAGNOSIS — I10 Essential (primary) hypertension: Secondary | ICD-10-CM | POA: Diagnosis present

## 2018-03-05 DIAGNOSIS — I130 Hypertensive heart and chronic kidney disease with heart failure and stage 1 through stage 4 chronic kidney disease, or unspecified chronic kidney disease: Secondary | ICD-10-CM | POA: Diagnosis not present

## 2018-03-05 DIAGNOSIS — K219 Gastro-esophageal reflux disease without esophagitis: Secondary | ICD-10-CM | POA: Diagnosis present

## 2018-03-05 DIAGNOSIS — Z9861 Coronary angioplasty status: Secondary | ICD-10-CM | POA: Diagnosis not present

## 2018-03-05 DIAGNOSIS — Z1211 Encounter for screening for malignant neoplasm of colon: Secondary | ICD-10-CM | POA: Diagnosis not present

## 2018-03-05 DIAGNOSIS — K644 Residual hemorrhoidal skin tags: Secondary | ICD-10-CM | POA: Diagnosis present

## 2018-03-05 DIAGNOSIS — N182 Chronic kidney disease, stage 2 (mild): Secondary | ICD-10-CM | POA: Diagnosis present

## 2018-03-05 DIAGNOSIS — D1339 Benign neoplasm of other parts of small intestine: Secondary | ICD-10-CM | POA: Diagnosis not present

## 2018-03-05 HISTORY — DX: Gastrointestinal hemorrhage, unspecified: K92.2

## 2018-03-05 LAB — CBC WITH DIFFERENTIAL/PLATELET
Abs Immature Granulocytes: 0.01 10*3/uL (ref 0.00–0.07)
BASOS ABS: 0 10*3/uL (ref 0.0–0.1)
Basophils Relative: 1 %
Eosinophils Absolute: 0.3 10*3/uL (ref 0.0–0.5)
Eosinophils Relative: 6 %
HCT: 39.6 % (ref 39.0–52.0)
Hemoglobin: 12.4 g/dL — ABNORMAL LOW (ref 13.0–17.0)
Immature Granulocytes: 0 %
Lymphocytes Relative: 21 %
Lymphs Abs: 1.2 10*3/uL (ref 0.7–4.0)
MCH: 29.2 pg (ref 26.0–34.0)
MCHC: 31.3 g/dL (ref 30.0–36.0)
MCV: 93.2 fL (ref 80.0–100.0)
Monocytes Absolute: 0.7 10*3/uL (ref 0.1–1.0)
Monocytes Relative: 12 %
NEUTROS ABS: 3.6 10*3/uL (ref 1.7–7.7)
Neutrophils Relative %: 60 %
Platelets: 178 10*3/uL (ref 150–400)
RBC: 4.25 MIL/uL (ref 4.22–5.81)
RDW: 17.3 % — ABNORMAL HIGH (ref 11.5–15.5)
WBC: 5.9 10*3/uL (ref 4.0–10.5)
nRBC: 0 % (ref 0.0–0.2)

## 2018-03-05 LAB — RAPID URINE DRUG SCREEN, HOSP PERFORMED
Amphetamines: NOT DETECTED
Barbiturates: NOT DETECTED
Benzodiazepines: NOT DETECTED
Cocaine: NOT DETECTED
OPIATES: NOT DETECTED
Tetrahydrocannabinol: NOT DETECTED

## 2018-03-05 LAB — PROTIME-INR
INR: 1.17
Prothrombin Time: 14.8 seconds (ref 11.4–15.2)

## 2018-03-05 LAB — URINALYSIS, ROUTINE W REFLEX MICROSCOPIC
Bilirubin Urine: NEGATIVE
Glucose, UA: NEGATIVE mg/dL
Hgb urine dipstick: NEGATIVE
Ketones, ur: NEGATIVE mg/dL
Nitrite: POSITIVE — AB
Protein, ur: NEGATIVE mg/dL
Specific Gravity, Urine: 1.016 (ref 1.005–1.030)
pH: 8 (ref 5.0–8.0)

## 2018-03-05 LAB — HEMOGLOBIN AND HEMATOCRIT, BLOOD
HCT: 38.9 % — ABNORMAL LOW (ref 39.0–52.0)
Hemoglobin: 12.4 g/dL — ABNORMAL LOW (ref 13.0–17.0)

## 2018-03-05 LAB — TYPE AND SCREEN
ABO/RH(D): O POS
Antibody Screen: NEGATIVE

## 2018-03-05 LAB — COMPREHENSIVE METABOLIC PANEL
ALBUMIN: 3.2 g/dL — AB (ref 3.5–5.0)
ALT: 25 U/L (ref 0–44)
AST: 32 U/L (ref 15–41)
Alkaline Phosphatase: 61 U/L (ref 38–126)
Anion gap: 10 (ref 5–15)
BUN: 14 mg/dL (ref 8–23)
CHLORIDE: 109 mmol/L (ref 98–111)
CO2: 22 mmol/L (ref 22–32)
Calcium: 8.8 mg/dL — ABNORMAL LOW (ref 8.9–10.3)
Creatinine, Ser: 1.17 mg/dL (ref 0.61–1.24)
GFR calc Af Amer: >60 mL/min (ref >60)
GFR calc non Af Amer: >60 mL/min (ref >60)
GLUCOSE: 111 mg/dL — AB (ref 70–99)
Potassium: 3.7 mmol/L (ref 3.5–5.1)
Sodium: 141 mmol/L (ref 135–145)
Total Bilirubin: 0.6 mg/dL (ref 0.3–1.2)
Total Protein: 6.2 g/dL — ABNORMAL LOW (ref 6.5–8.1)

## 2018-03-05 LAB — I-STAT TROPONIN, ED: Troponin i, poc: 0.02 ng/mL (ref 0.00–0.08)

## 2018-03-05 LAB — ABO/RH: ABO/RH(D): O POS

## 2018-03-05 LAB — LIPASE, BLOOD: Lipase: 32 U/L (ref 11–51)

## 2018-03-05 LAB — POC OCCULT BLOOD, ED: Fecal Occult Bld: POSITIVE — AB

## 2018-03-05 MED ORDER — IPRATROPIUM BROMIDE 0.02 % IN SOLN: 0.5000 mg | Freq: Four times a day (QID) | RESPIRATORY_TRACT | Status: DC | PRN

## 2018-03-05 MED ORDER — PANTOPRAZOLE SODIUM 40 MG IV SOLR
40.0000 mg | Freq: Once | INTRAVENOUS | Status: AC
  Administered 2018-03-05: 40 mg via INTRAVENOUS
  Filled 2018-03-05: qty 40

## 2018-03-05 MED ORDER — FUROSEMIDE 10 MG/ML IJ SOLN
20.0000 mg | Freq: Once | INTRAMUSCULAR | Status: AC
  Administered 2018-03-05: 20 mg via INTRAVENOUS
  Filled 2018-03-05: qty 2

## 2018-03-05 MED ORDER — SODIUM CHLORIDE 0.9 % IV SOLN
Freq: Once | INTRAVENOUS | Status: AC
  Administered 2018-03-05: 12:00:00 via INTRAVENOUS

## 2018-03-05 MED ORDER — LEVALBUTEROL HCL 0.63 MG/3ML IN NEBU: 0.6300 mg | INHALATION_SOLUTION | Freq: Four times a day (QID) | RESPIRATORY_TRACT | Status: DC | PRN

## 2018-03-05 MED ORDER — VITAMIN B-1 100 MG PO TABS
100.0000 mg | ORAL_TABLET | Freq: Every day | ORAL | Status: DC
  Administered 2018-03-05 – 2018-03-08 (×4): 100 mg via ORAL
  Filled 2018-03-05 (×4): qty 1

## 2018-03-05 MED ORDER — MORPHINE SULFATE (PF) 2 MG/ML IV SOLN
1.0000 mg | Freq: Four times a day (QID) | INTRAVENOUS | Status: DC | PRN
  Administered 2018-03-05 – 2018-03-08 (×8): 1 mg via INTRAVENOUS
  Filled 2018-03-05 (×8): qty 1

## 2018-03-05 MED ORDER — IPRATROPIUM-ALBUTEROL 0.5-2.5 (3) MG/3ML IN SOLN
3.0000 mL | Freq: Three times a day (TID) | RESPIRATORY_TRACT | Status: DC
  Administered 2018-03-05: 3 mL via RESPIRATORY_TRACT
  Filled 2018-03-05: qty 3

## 2018-03-05 MED ORDER — ACETAMINOPHEN 325 MG PO TABS
650.0000 mg | ORAL_TABLET | Freq: Four times a day (QID) | ORAL | Status: DC | PRN
  Administered 2018-03-05 – 2018-03-08 (×4): 650 mg via ORAL
  Filled 2018-03-05 (×4): qty 2

## 2018-03-05 MED ORDER — IPRATROPIUM-ALBUTEROL 0.5-2.5 (3) MG/3ML IN SOLN: 3.0000 mL | Freq: Three times a day (TID) | RESPIRATORY_TRACT | Status: DC

## 2018-03-05 MED ORDER — PANTOPRAZOLE SODIUM 40 MG IV SOLR
40.0000 mg | Freq: Two times a day (BID) | INTRAVENOUS | Status: DC
  Administered 2018-03-05 – 2018-03-07 (×4): 40 mg via INTRAVENOUS
  Filled 2018-03-05 (×4): qty 40

## 2018-03-05 MED ORDER — FOLIC ACID 1 MG PO TABS
1.0000 mg | ORAL_TABLET | Freq: Every day | ORAL | Status: DC
  Administered 2018-03-05 – 2018-03-08 (×4): 1 mg via ORAL
  Filled 2018-03-05 (×4): qty 1

## 2018-03-05 MED ORDER — ACETAMINOPHEN 650 MG RE SUPP: 650.0000 mg | Freq: Four times a day (QID) | RECTAL | Status: DC | PRN

## 2018-03-05 MED ORDER — DIGOXIN 125 MCG PO TABS
0.1250 mg | ORAL_TABLET | Freq: Every day | ORAL | Status: DC
  Administered 2018-03-05 – 2018-03-08 (×4): 0.125 mg via ORAL
  Filled 2018-03-05 (×4): qty 1
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
## 2018-03-05 NOTE — ED Triage Notes (Signed)
Pt reports rectal bleeding x57month. Reports bleeding was light initially, and increased to heavy with clots last night. Describes blood as dark. Pt seen here about month ago for same condition.

## 2018-03-05 NOTE — ED Notes (Signed)
Regular diet meal tray ordered for patient at this time
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
## 2018-03-05 NOTE — ED Notes (Signed)
Admitting MD bedside

## 2018-03-06 ENCOUNTER — Encounter (HOSPITAL_COMMUNITY): Payer: Self-pay | Admitting: General Practice

## 2018-03-06 ENCOUNTER — Other Ambulatory Visit: Payer: Self-pay

## 2018-03-06 DIAGNOSIS — I5042 Chronic combined systolic (congestive) and diastolic (congestive) heart failure: Secondary | ICD-10-CM

## 2018-03-06 DIAGNOSIS — I130 Hypertensive heart and chronic kidney disease with heart failure and stage 1 through stage 4 chronic kidney disease, or unspecified chronic kidney disease: Secondary | ICD-10-CM

## 2018-03-06 DIAGNOSIS — Z720 Tobacco use: Secondary | ICD-10-CM

## 2018-03-06 DIAGNOSIS — K921 Melena: Secondary | ICD-10-CM

## 2018-03-06 DIAGNOSIS — N183 Chronic kidney disease, stage 3 (moderate): Secondary | ICD-10-CM

## 2018-03-06 DIAGNOSIS — Z79899 Other long term (current) drug therapy: Secondary | ICD-10-CM

## 2018-03-06 LAB — CBC
HCT: 36.8 % — ABNORMAL LOW (ref 39.0–52.0)
Hemoglobin: 12 g/dL — ABNORMAL LOW (ref 13.0–17.0)
MCH: 30.1 pg (ref 26.0–34.0)
MCHC: 32.6 g/dL (ref 30.0–36.0)
MCV: 92.2 fL (ref 80.0–100.0)
Platelets: 152 10*3/uL (ref 150–400)
RBC: 3.99 MIL/uL — ABNORMAL LOW (ref 4.22–5.81)
RDW: 16.7 % — AB (ref 11.5–15.5)
WBC: 6.8 10*3/uL (ref 4.0–10.5)
nRBC: 0 % (ref 0.0–0.2)

## 2018-03-06 LAB — COMPREHENSIVE METABOLIC PANEL
ALBUMIN: 3 g/dL — AB (ref 3.5–5.0)
ALK PHOS: 64 U/L (ref 38–126)
ALT: 21 U/L (ref 0–44)
AST: 24 U/L (ref 15–41)
Anion gap: 10 (ref 5–15)
BUN: 9 mg/dL (ref 8–23)
CO2: 24 mmol/L (ref 22–32)
CREATININE: 1.18 mg/dL (ref 0.61–1.24)
Calcium: 8.7 mg/dL — ABNORMAL LOW (ref 8.9–10.3)
Chloride: 106 mmol/L (ref 98–111)
GFR calc Af Amer: >60 mL/min (ref >60)
GFR calc non Af Amer: >60 mL/min (ref >60)
GLUCOSE: 108 mg/dL — AB (ref 70–99)
Potassium: 4 mmol/L (ref 3.5–5.1)
Sodium: 140 mmol/L (ref 135–145)
Total Bilirubin: 0.8 mg/dL (ref 0.3–1.2)
Total Protein: 5.9 g/dL — ABNORMAL LOW (ref 6.5–8.1)

## 2018-03-06 MED ORDER — SUCRALFATE 1 GM/10ML PO SUSP
1.0000 g | Freq: Three times a day (TID) | ORAL | Status: DC
  Administered 2018-03-06 – 2018-03-08 (×9): 1 g via ORAL
  Filled 2018-03-06 (×8): qty 10

## 2018-03-06 MED ORDER — SACUBITRIL-VALSARTAN 24-26 MG PO TABS
1.0000 | ORAL_TABLET | Freq: Two times a day (BID) | ORAL | Status: DC
  Administered 2018-03-06 – 2018-03-08 (×5): 1 via ORAL
  Filled 2018-03-06 (×5): qty 1

## 2018-03-06 MED ORDER — SODIUM CHLORIDE 0.9 % IV SOLN
INTRAVENOUS | Status: DC
  Administered 2018-03-06: 23:00:00 via INTRAVENOUS

## 2018-03-06 MED ORDER — PEG 3350-KCL-NA BICARB-NACL 420 G PO SOLR
4000.0000 mL | Freq: Once | ORAL | Status: AC
  Administered 2018-03-06: 4000 mL via ORAL
  Filled 2018-03-06 (×2): qty 4000

## 2018-03-06 MED ORDER — FUROSEMIDE 40 MG PO TABS
40.0000 mg | ORAL_TABLET | Freq: Every day | ORAL | Status: DC
  Administered 2018-03-06 – 2018-03-08 (×3): 40 mg via ORAL
  Filled 2018-03-06 (×3): qty 1

## 2018-03-06 NOTE — Progress Notes (Signed)
   Subjective: HD#1   Overnight:No acute events  Today, Ryan [name] reports that he is still having bloody bowel movements with some clots, he had 1 episode early this morning. He reports that it is getting better and easing up. He is having some light headedness every now and then, that is worse when he stands too quickly. He reports that he is still having a lot of stomach pain over his right side, eating calms the pain down, and it is worse when he has not eaten in awhile. Denies any nausea or vomiting. Discussed that we will consult GI to see if they have any recommendations and that we will continue protonix and monitor his labs. He is in agreement with the plan.  He reports that the breathing treatments helped. He normally takes 40 mg Lasix daily, discussed that we will restart some of his home medications.   Objective:  Vital signs in last 24 hours: Vitals:   03/05/18 2020 03/06/18 0023 03/06/18 0348 03/06/18 0751  BP:  113/85 121/77 (!) 152/94  Pulse:  (!) 102 (!) 102 (!) 48  Resp:  18 18 20   Temp:  98.3 F (36.8 C) 98.6 F (37 C)   TempSrc:  Oral Oral   SpO2: 98% 98% 100% 99%  Weight:   91.4 kg   Height:       Constitutional: In no acute distress, comfortably lying in bed Cardiovascular: RRR, no murmurs, gallops, rubs.  Elevated JVP Respiratory: Clear to auscultation bilaterally Abdomen: Bowel sounds present, nondistended, moderately tender to palpation at the right middle/lower quadrant.  Assessment/Plan:  Active Problems:   GI bleed  Ryan [name] is a 67 year old African-American gentleman with combined systolic and diastolic heart failure (EF 10% to 15%), chronic kidney disease stage III, hypertension here for evaluation of recurrent hematochezia.  Hematochezia: Hematochezia still ongoing however reports slight improvement.  Also still passing clots.  Vital signs with persistent tachycardia to low 100s.  Hemoglobin stable at 12.  Differential includes peptic ulcer  disease versus gastritis versus duodenal ulcer. -Continue IV Protonix 40 mg daily, Carafate -Follow-up CBC -Follow-up GI recommendation  Combined systolic and diastolic heart failure: Shortness of breath improved.  Weight is down 1 pound.  Lower extremity edema has resolved. -Resume Entresto -Continue p.o. Lasix 40 mg daily -DuoNeb q8 prn   Chronic kidney disease stage III: sCr 1.1 -Continue to monitor  Hypertension: BP in the 150s/90s -Resume Entresto   FEN: Replace electrolytes as needed, heart healthy diet VTE ppx: SCDs CODE STATUS: Full code  Dispo: Admit patient to Inpatient with expected length of stay greater than 2 midnights.  [name], MD 03/06/2018, 8:12 AM Pager: [phone] IMTS PGY-1
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
## 2018-03-06 NOTE — Progress Notes (Signed)
  Date: 03/06/2018  Patient name: Ryan [name]  Medical record number: [id number]  Date of birth: 12/21/1950   I have seen and evaluated this patient and I have discussed the plan of care with the house staff. Please see their note for complete details. I concur with their findings with the following additions/corrections:   Please see my separate attestation on the H&P from 03/05/2018.  Lenice Pressman, M.D., Ph.D. 03/06/2018, 1:26 PM

## 2018-03-06 NOTE — Discharge Summary (Addendum)
Name: Ryan [name] MRN: [id number] DOB: October 14, 1950 67 y.o. PCP: [facility] [place]  Date of Admission: 03/05/2018  9:33 AM Date of Discharge:  03/08/2018 Attending Physician: Dr. Evette Doffing  Discharge Diagnosis: 1.  Hematochezia secondary to bleeding internal and external hemorrhoids 2.  Combined systolic and diastolic heart failure; nonischemic cardiomyopathy 3.  Chronic kidney disease stage III 4.  Hypertension  Discharge Medications: Allergies as of 03/08/2018   No Known Allergies     Medication List    TAKE these medications   acetaminophen 325 MG tablet Commonly known as:  TYLENOL Take 650 mg by mouth every 6 (six) hours as needed for mild pain.   diclofenac sodium 1 % Gel Commonly known as:  VOLTAREN Apply 1 application topically 2 (two) times daily.   digoxin 0.125 MG tablet Commonly known as:  LANOXIN Take 1 tablet (0.125 mg total) by mouth daily.   furosemide 40 MG tablet Commonly known as:  LASIX Take 1 tablet (40 mg total) by mouth daily.   gabapentin 300 MG capsule Commonly known as:  NEURONTIN Take 300 mg by mouth 3 (three) times daily.   hydrocortisone 25 MG suppository Commonly known as:  ANUSOL-HC Place 1 suppository (25 mg total) rectally 2 (two) times daily.   Investigational - Study Medication Take 1 tablet by mouth 2 (two) times daily. Study name: Galactic HF Study Additional study details: Omecamtiv Mecarbil or Placebo   LORazepam 1 MG tablet Commonly known as:  ATIVAN Take 1 tablet (1 mg total) by mouth 3 (three) times daily as needed for anxiety.   pantoprazole 40 MG tablet Commonly known as:  PROTONIX Take 1 tablet (40 mg total) by mouth 2 (two) times daily.   psyllium 95 % Pack Commonly known as:  HYDROCIL/METAMUCIL Take 1 packet by mouth daily.   sacubitril-valsartan 24-26 MG Commonly known as:  ENTRESTO Take 1 tablet by mouth 2 (two) times daily.       Disposition and follow-up:   [name] was  discharged from [facility] in [place] condition.  At the hospital follow up visit please address:  1.  Hematochezia secondary to bleeding internal and external hemorrhoids: Ensure compliance with Protonix, Anusol suppository and daily psyllium  2.  Labs / imaging needed at time of follow-up: CBC  3.  Pending labs/ test needing follow-up: Colonic polyp biopsy  Follow-up Appointments: Follow-up [place], [place]:  General Practice Contact information: [place] [address] [place] Hospital Course by problem list: 1.  Hematochezia: [name] is a 67 year old African-American gentleman with combined systolic and diastolic heart failure (LHC in 2018 with LVEF of 40-45%), chronic kidney disease stage III, hypertension who presented with a 3-day history of hematochezia, nonspecific abdominal pain unrelated to diet.    Per chart review, he was recently admitted from 11/23 to 11/27 with similar complaints.  Underwent EGD which showed moderately congested duodenal mucosa without active bleeding or duodenal ulcer.  Also underwent sigmoidoscopy which was unremarkable.  He reported of not being able to pick up the Protonix he was discharged with due to financial constraints.  On arrival, Vital signs were unremarkable with exception of tachycardia, hemoglobin was stable at ~12 and fecal occult blood test positive.  He was treated with IV Protonix 40 mg daily and Carafate.  In addition, he was evaluated by gastroenterology and underwent colonoscopy on 03/07/2018 which  showed internal and external bleeding hemorrhoids, 11 mm descending colonic polyp which was resected, multiple small and large diverticula in the sigmoid and descending colon with no evidence of active bleeding.  He is to continue Anusol suppository twice a day and daily psyllium.  In addition, he was advised to increase his fiber intake.  2.  Combined systolic and  diastolic heart failure; nonischemic cardiomyopathy: [name] underwent left heart cath in 2018 which showed normal coronaries with the ventricular ejection fraction of 40 to 45%.  On arrival, JVP is slightly elevated but lungs are clear and minimal lower extremity edema.  He was given a one-time dose of IV Lasix 20 mg with appropriate response and subsequently placed back on home Lasix 40 mg daily.  3.  Chronic kidney disease stage III: Remained stable.    4.  Hypertension: His home Entresto was resumed.  Discharge Vitals:   BP 110/70 (BP Location: Left Arm)   Pulse 97   Temp 98.1 F (36.7 C) (Oral)   Resp 20   Ht 5\' 11"  (1.803 m)   Wt 89.7 kg   SpO2 100%   BMI 27.57 kg/m   Pertinent Labs, Studies, and Procedures:  Colonoscopy 03/07/2018  Findings: Multiple small and large-mouthed diverticula were found in the sigmoid colon and descending colon. There was no evidence of diverticular bleeding. No blood present in the colon. A 11 mm polyp was found in the descending colon. The polyp was pedunculated. The polyp was removed with a hot snare. Resection and retrieval were complete.  - Hemorrhoids found on perianal exam and colonoscopy and are thought to be the cause of bleeding. - Moderate diverticulosis in the sigmoid colon and in the descending colon. There was no evidence of diverticular bleeding. - One 11 mm polyp in the descending colon, removed with a hot snare. Resected and retrieved. - One 3 mm polyp in the ascending colon, removed with a cold biopsy forceps. Resected and retrieved. - Two ileal polyps in the terminal ileum, removed with a cold biopsy forceps. Resected and retrieved. - The examination was otherwise normal on direct and retroflexion views.  Impression: - Advance diet as tolerated today. High fiber diet. - Keep stools soft. Daily psyllium recommended for stool bulking. - Maximize medical therapy for hemorrhoids. Anusol (pramoxine) HC suppository: Insert  rectally BID for 1 week. - Await pathology results for the polyps. - Would only consider surveillance endoscopy of his polyps if it is clinically appropriate given his comorbidities. - Consider small bowel investigation with any additional overt bleeding. - The results and recommendations were reviewed with the patient in the endoscopy unit before he returned to the hospital room.   CBC Latest Ref Rng & Units 03/08/2018 03/07/2018 03/06/2018  WBC 4.0 - 10.5 K/uL 6.2 5.6 6.8  Hemoglobin 13.0 - 17.0 g/dL 12.3(L) 13.2 12.0(L)  Hematocrit 39.0 - 52.0 % 38.6(L) 40.0 36.8(L)  Platelets 150 - 400 K/uL 157 158 152   CMP Latest Ref Rng & Units 03/08/2018 03/07/2018 03/06/2018  Glucose 70 - 99 mg/dL 132(H) 110(H) 108(H)  BUN 8 - 23 mg/dL 13 7(L) 9  Creatinine 0.61 - 1.24 mg/dL 1.28(H) 1.20 1.18  Sodium 135 - 145 mmol/L 139 138 140  Potassium 3.5 - 5.1 mmol/L 3.4(L) 3.7 4.0  Chloride 98 - 111 mmol/L 106 105 106  CO2 22 - 32 mmol/L 21(L) 25 24  Calcium 8.9 - 10.3 mg/dL 8.6(L) 8.7(L) 8.7(L)  Total Protein 6.5 - 8.1 g/dL - - 5.9(L)  Total Bilirubin  0.3 - 1.2 mg/dL - - 0.8  Alkaline Phos 38 - 126 U/L - - 64  AST 15 - 41 U/L - - 24  ALT 0 - 44 U/L - - 21    Discharge Instructions: Discharge Instructions    Call MD for:  difficulty breathing, headache or visual disturbances   Complete by:  As directed    Call MD for:  severe uncontrolled pain   Complete by:  As directed    Diet - low sodium heart healthy   Complete by:  As directed    Discharge instructions   Complete by:  As directed    Mr. Rooke,  It was a pleasure taking care of you at the hospital during your stay.  You were admitted because of rectal bleeding.  You underwent a colonoscopy which showed hemorrhoids.  I am discharging you with 2 medications.  You will use Anusol suppository twice a day and take daily psyllium.  In addition, I want you to eat a high-fiber diet.   Also continue taking the Protonix 40 mg twice a  day.  Please follow-up with your primary care physician  Take care and happy holidays. ~Dr. Eileen Stanford   Increase activity slowly   Complete by:  As directed       Signed: [name], MD 03/08/2018, 10:38 AM   Pager: [phone] IMT[facility] PGY-1

## 2018-03-06 NOTE — Plan of Care (Signed)
  Problem: Health Behavior/Discharge Planning: Goal: Ability to manage health-related needs will improve Outcome: Progressing   Problem: Clinical Measurements: Goal: Ability to maintain clinical measurements within normal limits will improve Outcome: Progressing Goal: Will remain free from infection Outcome: Progressing Goal: Diagnostic test results will improve Outcome: Progressing Goal: Respiratory complications will improve Outcome: Progressing Goal: Cardiovascular complication will be avoided Outcome: Progressing   Problem: Activity: Goal: Risk for activity intolerance will decrease Outcome: Progressing   Problem: Nutrition: Goal: Adequate nutrition will be maintained Outcome: Progressing   Problem: Elimination: Goal: Will not experience complications related to bowel motility Outcome: Progressing Goal: Will not experience complications related to urinary retention Outcome: Progressing   Problem: Pain Managment: Goal: General experience of comfort will improve Outcome: Progressing   Problem: Safety: Goal: Ability to remain free from injury will improve Outcome: Progressing   

## 2018-03-07 ENCOUNTER — Inpatient Hospital Stay (HOSPITAL_COMMUNITY): Payer: Medicare Other | Admitting: Critical Care Medicine

## 2018-03-07 ENCOUNTER — Encounter (HOSPITAL_COMMUNITY): Payer: Self-pay | Admitting: *Deleted

## 2018-03-07 ENCOUNTER — Encounter (HOSPITAL_COMMUNITY)
Admission: EM | Disposition: A | Discharge status: Home / Self Care | Payer: Self-pay | Source: Home / Self Care | Attending: Internal Medicine

## 2018-03-07 DIAGNOSIS — Z9861 Coronary angioplasty status: Secondary | ICD-10-CM

## 2018-03-07 DIAGNOSIS — D122 Benign neoplasm of ascending colon: Secondary | ICD-10-CM

## 2018-03-07 DIAGNOSIS — I428 Other cardiomyopathies: Secondary | ICD-10-CM

## 2018-03-07 DIAGNOSIS — K922 Gastrointestinal hemorrhage, unspecified: Secondary | ICD-10-CM

## 2018-03-07 DIAGNOSIS — D124 Benign neoplasm of descending colon: Secondary | ICD-10-CM | POA: Diagnosis present

## 2018-03-07 DIAGNOSIS — D1339 Benign neoplasm of other parts of small intestine: Secondary | ICD-10-CM

## 2018-03-07 DIAGNOSIS — K649 Unspecified hemorrhoids: Secondary | ICD-10-CM | POA: Clinically undetermined

## 2018-03-07 HISTORY — PX: POLYPECTOMY: SHX5525

## 2018-03-07 HISTORY — PX: BIOPSY: SHX5522

## 2018-03-07 HISTORY — PX: COLONOSCOPY: SHX5424

## 2018-03-07 LAB — CBC
HCT: 40 % (ref 39.0–52.0)
Hemoglobin: 13.2 g/dL (ref 13.0–17.0)
MCH: 30.1 pg (ref 26.0–34.0)
MCHC: 33 g/dL (ref 30.0–36.0)
MCV: 91.1 fL (ref 80.0–100.0)
Platelets: 158 10*3/uL (ref 150–400)
RBC: 4.39 MIL/uL (ref 4.22–5.81)
RDW: 16.6 % — ABNORMAL HIGH (ref 11.5–15.5)
WBC: 5.6 10*3/uL (ref 4.0–10.5)
nRBC: 0 % (ref 0.0–0.2)

## 2018-03-07 LAB — BASIC METABOLIC PANEL
Anion gap: 8 (ref 5–15)
BUN: 7 mg/dL — ABNORMAL LOW (ref 8–23)
CO2: 25 mmol/L (ref 22–32)
Calcium: 8.7 mg/dL — ABNORMAL LOW (ref 8.9–10.3)
Chloride: 105 mmol/L (ref 98–111)
Creatinine, Ser: 1.2 mg/dL (ref 0.61–1.24)
GFR calc Af Amer: >60 mL/min (ref >60)
GFR calc non Af Amer: >60 mL/min (ref >60)
Glucose, Bld: 110 mg/dL — ABNORMAL HIGH (ref 70–99)
Potassium: 3.7 mmol/L (ref 3.5–5.1)
Sodium: 138 mmol/L (ref 135–145)

## 2018-03-07 SURGERY — COLONOSCOPY
Anesthesia: Monitor Anesthesia Care | Laterality: Left

## 2018-03-07 MED ORDER — PHENYLEPHRINE 40 MCG/ML (10ML) SYRINGE FOR IV PUSH (FOR BLOOD PRESSURE SUPPORT)
PREFILLED_SYRINGE | INTRAVENOUS | Status: DC | PRN
  Administered 2018-03-07 (×3): 80 ug via INTRAVENOUS

## 2018-03-07 MED ORDER — PROPOFOL 10 MG/ML IV BOLUS
INTRAVENOUS | Status: DC | PRN
  Administered 2018-03-07: 20 mg via INTRAVENOUS

## 2018-03-07 MED ORDER — HYDROCORTISONE ACETATE 25 MG RE SUPP
25.0000 mg | Freq: Two times a day (BID) | RECTAL | Status: DC
  Administered 2018-03-07 – 2018-03-08 (×2): 25 mg via RECTAL
  Filled 2018-03-07 (×3): qty 1

## 2018-03-07 MED ORDER — PANTOPRAZOLE SODIUM 40 MG PO TBEC
40.0000 mg | DELAYED_RELEASE_TABLET | Freq: Every day | ORAL | Status: DC
  Administered 2018-03-08: 40 mg via ORAL
  Filled 2018-03-07: qty 1

## 2018-03-07 MED ORDER — LACTATED RINGERS IV SOLN
INTRAVENOUS | Status: DC
  Administered 2018-03-07: 11:00:00 via INTRAVENOUS

## 2018-03-07 MED ORDER — PROPOFOL 500 MG/50ML IV EMUL
INTRAVENOUS | Status: DC | PRN
  Administered 2018-03-07: 100 ug/kg/min via INTRAVENOUS

## 2018-03-07 MED ORDER — PSYLLIUM 95 % PO PACK
1.0000 | PACK | Freq: Every day | ORAL | Status: DC
  Administered 2018-03-08: 1 via ORAL
  Filled 2018-03-07: qty 1

## 2018-03-07 MED ORDER — LIDOCAINE 2% (20 MG/ML) 5 ML SYRINGE
INTRAMUSCULAR | Status: DC | PRN
  Administered 2018-03-07: 40 mg via INTRAVENOUS

## 2018-03-07 NOTE — Progress Notes (Signed)
   Subjective: HD#2   Overnight: Seen and evaluated by gastroenterology.  No acute events reported.  Today, Mr. Gilford Rile reports he feels weak because of diarrhea from the bowel prep.  Otherwise he is doing well.  His right-sided mid/lateral abdominal pain is still present and stable.  Denies nausea or vomiting.  Also, he stays hematochezia is still ongoing.  Objective:  Vital signs in last 24 hours: Vitals:   03/06/18 1632 03/06/18 2036 03/07/18 0530 03/07/18 0548  BP: (!) 124/92 (!) 131/102 (!) 100/59   Pulse: 92 98 100   Resp: 20 16 18    Temp: 98.6 F (37 C) 98.1 F (36.7 C) 98.5 F (36.9 C)   TempSrc: Oral Oral Oral   SpO2: 100% 100% 100%   Weight:    88.9 kg  Height:       Constitutional: In no acute distress, lying in bed, dry oral mucosa Cardiovascular: RRR, no murmurs, gallops, rubs Abdomen: Bowel sounds present, right mid/lower quadrant mildly tender to palpation.  Assessment/Plan:  Principal Problem:   GI bleed Active Problems:   Essential hypertension   CKD (chronic kidney disease), stage III (HCC)   Chronic combined systolic and diastolic CHF (congestive heart failure) [facility])  Mr. Czajkowski is a 67 year old African-American gentleman with combined systolic and diastolic heart failure (EF 10% to15%), chronic kidney disease stage III, hypertension here for evaluation of recurrent hematochezia.  Hematochezia:  Still present.  Complains of weakness due to diarrhea from GI bowel prep. Differential includes peptic ulcer disease versus gastritis versus duodenal ulcer vs diverticulosis. -Continue IV Protonix 40 mg daily, Carafate -Follow-up CBC -Will undergo colonoscopy today - Follow GI recommendation.  Combined systolic and diastolic heart failure Nonischemic cardiomyopathy He underwent left heart cath in November 2018 which revealed normal coronary arteries and left ventricular EF estimated to be about 40 to 45%.  Appears euvolemic no signs of heart failure  exacerbation. -Continue Entresto -Continue p.o. Lasix 40 mg daily -DuoNebq8 prn  Chronic kidney disease stage LFY:BOFBPZ -Continue to monitor  Hypertension: BP this a.m. 100/59 -Continue Entresto   WCH:ENIDPOE electrolytes as needed, heart healthy diet VTE UMP:NTIR CODE STATUS:Full code  Dispo: Admit patient toInpatient with expected length of stay greater than 2 midnights.  [name], MD 03/07/2018, 6:11 AM Pager: [phone] IMTS PGY-1

## 2018-03-07 NOTE — Plan of Care (Signed)
  Problem: Health Behavior/Discharge Planning: Goal: Ability to manage health-related needs will improve Outcome: Progressing   Problem: Clinical Measurements: Goal: Ability to maintain clinical measurements within normal limits will improve Outcome: Progressing Goal: Will remain free from infection Outcome: Progressing Goal: Diagnostic test results will improve Outcome: Progressing Goal: Respiratory complications will improve Outcome: Progressing Goal: Cardiovascular complication will be avoided Outcome: Progressing   Problem: Activity: Goal: Risk for activity intolerance will decrease Outcome: Progressing   Problem: Nutrition: Goal: Adequate nutrition will be maintained Outcome: Progressing   Problem: Elimination: Goal: Will not experience complications related to bowel motility Outcome: Progressing Goal: Will not experience complications related to urinary retention Outcome: Progressing   Problem: Pain Managment: Goal: General experience of comfort will improve Outcome: Progressing   Problem: Safety: Goal: Ability to remain free from injury will improve Outcome: Progressing   

## 2018-03-07 NOTE — Interval H&P Note (Signed)
History and Physical Interval Note:  03/07/2018 12:05 PM  Ryan [name]  has presented today for surgery, with the diagnosis of Rectal bleeding  The various methods of treatment have been discussed with the patient and family. After consideration of risks, benefits and other options for treatment, the patient has consented to  Procedure(s): COLONOSCOPY (Left) as a surgical intervention .  The patient's history has been reviewed, patient examined, no change in status, stable for surgery.  I have reviewed the patient's chart and labs.  Questions were answered to the patient's satisfaction.     Thornton Park

## 2018-03-07 NOTE — Progress Notes (Addendum)
  Date: 03/07/2018  Patient name: Ryan [name]  Medical record number: [id number]  Date of birth: 18-Apr-1950   I have seen and evaluated this patient and I have discussed the plan of care with the house staff. Please see their note for complete details. I concur with their findings with the following additions/corrections:   67 year old man with HFrEF and CKD 3 admitted with recurrent hematochezia.  On his last admission for the same problem a month ago, he had an EGD and sigmoidoscopy that showed some duodenal congestion but no clear etiology for his bleeding.  He went for colonoscopy with GI today, which revealed bleeding internal and external hemorrhoids and a few scattered polyps that were resected.  It is possible that his hematochezia is related to his hemorrhoids, but his sigmoidoscopy a month ago did not show any hemorrhoids.  I wonder about another etiology for his GI bleeding, possibly within the small bowel, but agree that it is reasonable to begin treatment for hemorrhoids with topical Anusol and daily psyllium.  If his bleeding persists, he may need further evaluation such as with a capsule endoscopy.  He has had difficulty in the past with picking up medications at discharge, so we will work with our transitions of care pharmacy to ensure that the medications are brought to his bedside before discharge.  This likely will not be able to happen until tomorrow and he reports he does not have a ride home or anyone to let him in at home until tomorrow, so we will plan for discharge tomorrow.  Lenice Pressman, M.D., Ph.D. 03/07/2018, 4:17 PM

## 2018-03-07 NOTE — Transfer of Care (Signed)
Immediate Anesthesia Transfer of Care Note  Patient: Ryan [name]  Procedure(s) Performed: COLONOSCOPY (Left ) POLYPECTOMY  Patient Location: Endoscopy Unit  Anesthesia Type:MAC  Level of Consciousness: drowsy  Airway & Oxygen Therapy: Patient Spontanous Breathing and Patient connected to face mask oxygen  Post-op Assessment: Report given to RN and Post -op Vital signs reviewed and stable  Post vital signs: Reviewed and stable  Last Vitals:  Vitals Value Taken Time  BP 131/51 03/07/2018 12:45 PM  Temp    Pulse 95 03/07/2018 12:46 PM  Resp 27 03/07/2018 12:46 PM  SpO2 100 % 03/07/2018 12:46 PM  Vitals shown include unvalidated device data.  Last Pain:  Vitals:   03/07/18 1100  TempSrc: Oral  PainSc: 0-No pain      Patients Stated Pain Goal: 0 (77/93/96 8864)  Complications: No apparent anesthesia complications

## 2018-03-07 NOTE — Anesthesia Postprocedure Evaluation (Signed)
Anesthesia Post Note  Patient: Ryan [name]  Procedure(s) Performed: COLONOSCOPY (Left ) POLYPECTOMY     Patient location during evaluation: PACU Anesthesia Type: MAC Level of consciousness: awake and alert Pain management: pain level controlled Vital Signs Assessment: post-procedure vital signs reviewed and stable Respiratory status: spontaneous breathing, nonlabored ventilation, respiratory function stable and patient connected to nasal cannula oxygen Cardiovascular status: stable and blood pressure returned to baseline Postop Assessment: no apparent nausea or vomiting Anesthetic complications: no    Last Vitals:  Vitals:   03/07/18 1320 03/07/18 1339  BP: 128/75 121/87  Pulse: 92 70  Resp: (!) 23 18  Temp:  36.8 C  SpO2: 96% 95%    Last Pain:  Vitals:   03/07/18 1400  TempSrc:   PainSc: 7                  Montez Hageman

## 2018-03-07 NOTE — Op Note (Addendum)
[facility] Patient Name: Ryan [name] Procedure Date : 03/07/2018 MRN: [id number] Attending MD: Thornton Park MD, MD Date of Birth: May 17, 1950 CSN: [id number] Age: 67 Admit Type: Inpatient Procedure:                Colonoscopy Indications:              This is the patient's first colonoscopy,                            Hematochezia Providers:                Thornton Park MD, MD, Carlyn Reichert, RNDonna                            Alvino Chapel, RN, , Cherylynn Ridges, Technician, Pearline Cables, CRNA Referring MD:              Medicines:                See the Anesthesia note for documentation of the                            administered medications Complications:            No immediate complications. Estimated blood loss:                            None. Estimated Blood Loss:     Estimated blood loss: none. Procedure:                Pre-Anesthesia Assessment:                           - Prior to the procedure, a History and Physical                            was performed, and patient medications and                            allergies were reviewed. The patient's tolerance of                            previous anesthesia was also reviewed. The risks                            and benefits of the procedure and the sedation                            options and risks were discussed with the patient.                            All questions were answered, and informed consent                            was obtained. Prior Anticoagulants: The patient has  taken no previous anticoagulant or antiplatelet                            agents. ASA Grade Assessment: III - A patient with                            severe systemic disease. After reviewing the risks                            and benefits, the patient was deemed in                            satisfactory condition to undergo the procedure.                            After obtaining informed consent, the colonoscope                            was passed under direct vision. Throughout the                            procedure, the patient's blood pressure, pulse, and                            oxygen saturations were monitored continuously. The                            CF-HQ190L ([id number]) Olympus adult colon was                            introduced through the anus and advanced to the the                            terminal ileum, with identification of the                            appendiceal orifice and IC valve. The colonoscopy                            was performed without difficulty. The patient                            tolerated the procedure well. The quality of the                            bowel preparation was good. The terminal ileum,                            ileocecal valve, appendiceal orifice, and rectum                            were photographed. Scope In: 12:20:23 PM Scope Out: 51:88:41 PM Scope Withdrawal Time: 0 hours 17 minutes 30 seconds  Total Procedure Duration: 0 hours  20 minutes 51 seconds  Findings:      Hemorrhoids were found on perianal exam. They appeared to be recently       bleeding. Both internal and external hemorrhoids were identified.      Multiple small and large-mouthed diverticula were found in the sigmoid       colon and descending colon. There was no evidence of diverticular       bleeding. No blood present in the colon.      A 11 mm polyp was found in the descending colon. The polyp was       pedunculated. The polyp was removed with a hot snare. Resection and       retrieval were complete. Estimated blood loss: none.      A 3 mm polyp was found in the ascending colon. The polyp was sessile.       The polyp was removed with a cold biopsy forceps. Resection and       retrieval were complete. Estimated blood loss: none.      The terminal ileum contained one sessile, non-bleeding possible polyp        versus lymphoid follicle. The polyps were 3 mm in diameter and located       53mm from the IC valve. The polyp was removed with a cold biopsy forceps.       Resection and retrieval were complete. Estimated blood loss: none. No       blood present in the small intestine.      Two small hyperplastic polyps in the rectum were not removed. The exam       was otherwise without abnormality on direct and retroflexion views. Impression:               - Hemorrhoids found on perianal exam and                            colonoscopy and are thought to be the cause of                            bleeding.                           - Moderate diverticulosis in the sigmoid colon and                            in the descending colon. There was no evidence of                            diverticular bleeding.                           - One 11 mm polyp in the descending colon, removed                            with a hot snare. Resected and retrieved.                           - One 3 mm polyp in the ascending colon, removed  with a cold biopsy forceps. Resected and retrieved.                           - Two ileal polyps in the terminal ileum, removed                            with a cold biopsy forceps. Resected and retrieved.                           - The examination was otherwise normal on direct                            and retroflexion views. Recommendation:           - Advance diet as tolerated today. High fiber diet.                           - Keep stools soft. Daily psyllium recommended for                            stool bulking.                           - Maximize medical therapy for hemorrhoids. Anusol                            (pramoxine) HC suppository: Insert rectally BID for                            1 week.                           - Await pathology results for the polyps.                           - Would only consider surveillance endoscopy of  his                            polyps if it is clinically appropriate given his                            comorbidities.                           - Consider small bowel investigation with any                            additional overt bleeding.                           - The results and recommendations were reviewed                            with the patient in the endoscopy unit before he  returned to the hospital room. Procedure Code(s):        --- Professional ---                           [id number], Colonoscopy, flexible; with removal of                            tumor(s), polyp(s), or other lesion(s) by snare                            technique                           45380, 78, Colonoscopy, flexible; with biopsy,                            single or multiple Diagnosis Code(s):        --- Professional ---                           K64.9, Unspecified hemorrhoids                           D12.4, Benign neoplasm of descending colon                           D12.2, Benign neoplasm of ascending colon                           D13.39, Benign neoplasm of other parts of small                            intestine CPT copyright 2018 [employer]. All rights reserved. The codes documented in this report are preliminary and upon coder review may  be revised to meet current compliance requirements. Thornton Park MD, MD 03/07/2018 1:12:52 PM This report has been signed electronically. Number of Addenda: 0

## 2018-03-07 NOTE — Plan of Care (Signed)
  Problem: Health Behavior/Discharge Planning: Goal: Ability to manage health-related needs will improve Outcome: Progressing   Problem: Clinical Measurements: Goal: Ability to maintain clinical measurements within normal limits will improve Outcome: Progressing Goal: Will remain free from infection Outcome: Progressing Goal: Respiratory complications will improve Outcome: Progressing Goal: Cardiovascular complication will be avoided Outcome: Progressing   Problem: Activity: Goal: Risk for activity intolerance will decrease Outcome: Progressing   Problem: Nutrition: Goal: Adequate nutrition will be maintained Outcome: Progressing   Problem: Elimination: Goal: Will not experience complications related to urinary retention Outcome: Progressing   Problem: Pain Managment: Goal: General experience of comfort will improve Outcome: Progressing   Problem: Safety: Goal: Ability to remain free from injury will improve Outcome: Progressing

## 2018-03-07 NOTE — Anesthesia Preprocedure Evaluation (Signed)
Anesthesia Evaluation  Patient identified by MRN, date of birth, ID band Patient awake    Reviewed: Allergy & Precautions, NPO status , Patient's Chart, lab work & pertinent test results  Airway Mallampati: II  TM Distance: >3 FB Neck ROM: Full    Dental no notable dental hx. (+) Poor Dentition   Pulmonary COPD, Current Smoker,    Pulmonary exam normal breath sounds clear to auscultation       Cardiovascular hypertension, +CHF  Normal cardiovascular exam Rhythm:Regular Rate:Normal     Neuro/Psych negative neurological ROS  negative psych ROS   GI/Hepatic negative GI ROS, (+)     substance abuse  alcohol use and cocaine use,   Endo/Other  negative endocrine ROS  Renal/GU Renal InsufficiencyRenal disease  negative genitourinary   Musculoskeletal negative musculoskeletal ROS (+)   Abdominal   Peds negative pediatric ROS (+)  Hematology negative hematology ROS (+)   Anesthesia Other Findings   Reproductive/Obstetrics negative OB ROS                             Anesthesia Physical Anesthesia Plan  ASA: III  Anesthesia Plan: MAC   Post-op Pain Management:    Induction: Intravenous  PONV Risk Score and Plan: 0 and Treatment may vary due to age or medical condition  Airway Management Planned: Simple Face Mask  Additional Equipment:   Intra-op Plan:   Post-operative Plan:   Informed Consent: I have reviewed the patients History and Physical, chart, labs and discussed the procedure including the risks, benefits and alternatives for the proposed anesthesia with the patient or authorized representative who has indicated his/her understanding and acceptance.   Dental advisory given  Plan Discussed with: CRNA  Anesthesia Plan Comments:         Anesthesia Quick Evaluation

## 2018-03-07 NOTE — Anesthesia Procedure Notes (Signed)
Procedure Name: MAC Date/Time: 03/07/2018 12:14 PM Performed by: Colin Benton, CRNA Pre-anesthesia Checklist: Patient identified, Emergency Drugs available, Suction available and Patient being monitored Patient Re-evaluated:Patient Re-evaluated prior to induction Oxygen Delivery Method: Simple face mask Preoxygenation: Pre-oxygenation with 100% oxygen Induction Type: IV induction Placement Confirmation: positive ETCO2 Dental Injury: Teeth and Oropharynx as per pre-operative assessment

## 2018-03-08 ENCOUNTER — Encounter (HOSPITAL_COMMUNITY): Payer: Self-pay | Admitting: Gastroenterology

## 2018-03-08 DIAGNOSIS — K648 Other hemorrhoids: Principal | ICD-10-CM

## 2018-03-08 DIAGNOSIS — K644 Residual hemorrhoidal skin tags: Secondary | ICD-10-CM

## 2018-03-08 DIAGNOSIS — K635 Polyp of colon: Secondary | ICD-10-CM

## 2018-03-08 LAB — CBC
HCT: 38.6 % — ABNORMAL LOW (ref 39.0–52.0)
Hemoglobin: 12.3 g/dL — ABNORMAL LOW (ref 13.0–17.0)
MCH: 29.1 pg (ref 26.0–34.0)
MCHC: 31.9 g/dL (ref 30.0–36.0)
MCV: 91.3 fL (ref 80.0–100.0)
Platelets: 157 10*3/uL (ref 150–400)
RBC: 4.23 MIL/uL (ref 4.22–5.81)
RDW: 16.5 % — ABNORMAL HIGH (ref 11.5–15.5)
WBC: 6.2 10*3/uL (ref 4.0–10.5)
nRBC: 0 % (ref 0.0–0.2)

## 2018-03-08 LAB — BASIC METABOLIC PANEL
Anion gap: 12 (ref 5–15)
BUN: 13 mg/dL (ref 8–23)
CO2: 21 mmol/L — ABNORMAL LOW (ref 22–32)
Calcium: 8.6 mg/dL — ABNORMAL LOW (ref 8.9–10.3)
Chloride: 106 mmol/L (ref 98–111)
Creatinine, Ser: 1.28 mg/dL — ABNORMAL HIGH (ref 0.61–1.24)
GFR calc Af Amer: >60 mL/min (ref >60)
GFR calc non Af Amer: 57 mL/min — ABNORMAL LOW (ref >60)
Glucose, Bld: 132 mg/dL — ABNORMAL HIGH (ref 70–99)
Potassium: 3.4 mmol/L — ABNORMAL LOW (ref 3.5–5.1)
Sodium: 139 mmol/L (ref 135–145)

## 2018-03-08 MED ORDER — PANTOPRAZOLE SODIUM 40 MG PO TBEC: 40.0000 mg | DELAYED_RELEASE_TABLET | Freq: Two times a day (BID) | ORAL | 0 refills | Status: DC

## 2018-03-08 MED ORDER — HYDROCORTISONE ACETATE 25 MG RE SUPP: 25.0000 mg | Freq: Two times a day (BID) | RECTAL | 0 refills | Status: DC

## 2018-03-08 MED ORDER — PSYLLIUM 95 % PO PACK: 1.0000 | PACK | Freq: Every day | ORAL | 0 refills | Status: DC

## 2018-03-08 NOTE — Progress Notes (Signed)
Reviewed AVS discharge instructions with patient/caregiver. Patient/caregiver verbalizes understanding of instructions received. AVS and prescriptions received by patient/caregiver. If present, telemetry box removed and central cardiac monitoring department notified of discharge. Peripheral IV removed, site benign with tip intact. Patient awaiting ride home for discharge.

## 2018-03-08 NOTE — Consult Note (Signed)
   [facility] Inpatient Consult   03/08/2018  Ryan [name] 01/15/1951 [id number]    Patient screened for potential [facility] Care Management services due to unplanned readmission risk score of 21% and 2 hospitalizations within past 6 months.   Went to bedside to speak with Mr. Gilbert to explain [facility]. Patient states that he has some who helps him who is an Therapist, sports and does not think that he will need additional follow up at this time. Patient states that he sees providers at [facility] and the [facility]. Patient declines [facility] CM services. Patient offered and accepted a [facility] brochure and told to call the toll free number should he change his mind.   Netta Cedars, MSN, [facility] Liaison Nurse Mobile Phone [phone]  Toll free office [phone]

## 2018-03-08 NOTE — Progress Notes (Signed)
   Subjective: HD#3   Overnight: No acute events reported  Today, Ryan [name] reports hematochezia is still ongoing though not as severe as it was prior to admission.  Overall he states he is doing well.  He tolerated the colonoscopy well yesterday with no issues.  On further questioning, he indicated that he sees a primary doctor at the [place] in [place].  Objective:  Vital signs in last 24 hours: Vitals:   03/07/18 1339 03/07/18 2029 03/07/18 2122 03/08/18 0341  BP: 121/87 106/66 108/76 110/70  Pulse: 70 93 (!) 104 (!) 111  Resp: 18  18 20   Temp: 98.2 F (36.8 C) 97.9 F (36.6 C)  98.1 F (36.7 C)  TempSrc: Oral Oral  Oral  SpO2: 95% 98%  100%  Weight:    89.7 kg  Height:       Constitutional: In no distress, lying comfortably in bed Abdomen: Bowel sounds present, nontender to palpation.  Assessment/Plan:  Principal Problem:   GI bleed Active Problems:   Essential hypertension   CKD (chronic kidney disease), stage III (HCC)   Chronic combined systolic and diastolic CHF (congestive heart failure) (HCC)   Adenomatous polyp of descending colon   Bleeding hemorrhoids  Ryan [name] is a 67 year old African-American gentleman with combined systolic and diastolic heart failure (EF 40-45% on LHC 2018), chronic kidney disease stage III, hypertension here for evaluation of recurrent hematochezia.  Hematochezia Internal and external hemorrhoids Underwent colonoscopy yesterday which revealed bleeding internal and external hemorrhoids.  He was also found to have an 11 mm descending colonic polyp which was resected, multiple small and large diverticula in the sigmoid and descending colon with no evidence of bleeding.  Remains hemodynamically stable.  CBC this a.m. also stable.  He is expected to have intermittent rectal bleeding until diverticula heals but has been advised to be compliant with medications and follow-up with PCP.  -Continue IV Protonix 40 mg daily, Carafate -Medically stable  to discharge on Anusol suppository and daily psyllium which will be delivered at bedside prior to discharge.  Combined systolic and diastolic heart failure Nonischemic cardiomyopathy S/p left heart cath in November 2018 which revealed normal coronary arteries and left ventricular EF estimated to be about 40 to 45%.   Point-of-care ultrasound this a.m. was reassuring. -Continue Entresto -Continue p.o. Lasix 40 mg daily -DuoNebq8 prn  Chronic kidney disease stage III: Stable -Continue to monitor  Hypertension: Blood pressure stable. -ContinueEntresto   KGY:JEHUDJS electrolytes as needed, heart healthy diet VTE HFW:YOVZ CODE STATUS:Full code  Dispo: Stable discharge home today.  Ryan Rosenthal, MD 03/08/2018, 6:14 AM Pager: [phone] IMTS PGY-1

## 2018-03-08 NOTE — Care Management Important Message (Signed)
Important Message  Patient Details  Name: Ryan [name] MRN: [id number] Date of Birth: [birth year]   Medicare Important Message Given:  Yes    [name] 03/08/2018, 4:42 PM

## 2018-03-11 ENCOUNTER — Encounter (HOSPITAL_COMMUNITY): Payer: Medicare Other

## 2018-03-27 ENCOUNTER — Encounter (HOSPITAL_COMMUNITY): Payer: Medicare Other

## 2018-04-08 ENCOUNTER — Telehealth (HOSPITAL_COMMUNITY): Payer: Self-pay | Admitting: Student

## 2018-04-08 NOTE — Telephone Encounter (Signed)
Called patient to reschedule 04/09/2018 appt with [facility].  No answer and no VM on phone.  Will try again later today.

## 2018-04-09 ENCOUNTER — Ambulatory Visit (HOSPITAL_COMMUNITY)
Admission: RE | Admit: 2018-04-09 | Discharge: 2018-04-09 | Disposition: A | Discharge status: Home / Self Care | Payer: Medicare Other | Source: Ambulatory Visit | Attending: Internal Medicine | Admitting: Internal Medicine

## 2018-04-09 ENCOUNTER — Encounter (HOSPITAL_COMMUNITY): Payer: Self-pay

## 2018-04-09 VITALS — BP 148/72 | HR 108 | Wt 204.0 lb

## 2018-04-09 DIAGNOSIS — F17200 Nicotine dependence, unspecified, uncomplicated: Secondary | ICD-10-CM

## 2018-04-09 DIAGNOSIS — M1710 Unilateral primary osteoarthritis, unspecified knee: Secondary | ICD-10-CM | POA: Diagnosis not present

## 2018-04-09 DIAGNOSIS — N183 Chronic kidney disease, stage 3 unspecified: Secondary | ICD-10-CM

## 2018-04-09 DIAGNOSIS — F1010 Alcohol abuse, uncomplicated: Secondary | ICD-10-CM | POA: Insufficient documentation

## 2018-04-09 DIAGNOSIS — I428 Other cardiomyopathies: Secondary | ICD-10-CM | POA: Diagnosis not present

## 2018-04-09 DIAGNOSIS — Z79899 Other long term (current) drug therapy: Secondary | ICD-10-CM | POA: Diagnosis not present

## 2018-04-09 DIAGNOSIS — F329 Major depressive disorder, single episode, unspecified: Secondary | ICD-10-CM | POA: Diagnosis not present

## 2018-04-09 DIAGNOSIS — K219 Gastro-esophageal reflux disease without esophagitis: Secondary | ICD-10-CM | POA: Insufficient documentation

## 2018-04-09 DIAGNOSIS — R9431 Abnormal electrocardiogram [ECG] [EKG]: Secondary | ICD-10-CM | POA: Diagnosis not present

## 2018-04-09 DIAGNOSIS — I5022 Chronic systolic (congestive) heart failure: Secondary | ICD-10-CM | POA: Diagnosis not present

## 2018-04-09 DIAGNOSIS — Z8249 Family history of ischemic heart disease and other diseases of the circulatory system: Secondary | ICD-10-CM | POA: Insufficient documentation

## 2018-04-09 DIAGNOSIS — I130 Hypertensive heart and chronic kidney disease with heart failure and stage 1 through stage 4 chronic kidney disease, or unspecified chronic kidney disease: Secondary | ICD-10-CM | POA: Diagnosis present

## 2018-04-09 DIAGNOSIS — M545 Low back pain: Secondary | ICD-10-CM | POA: Diagnosis not present

## 2018-04-09 DIAGNOSIS — G8929 Other chronic pain: Secondary | ICD-10-CM | POA: Insufficient documentation

## 2018-04-09 DIAGNOSIS — Z9119 Patient's noncompliance with other medical treatment and regimen: Secondary | ICD-10-CM | POA: Insufficient documentation

## 2018-04-09 DIAGNOSIS — I5042 Chronic combined systolic (congestive) and diastolic (congestive) heart failure: Secondary | ICD-10-CM

## 2018-04-09 DIAGNOSIS — R000 Tachycardia, unspecified: Secondary | ICD-10-CM | POA: Insufficient documentation

## 2018-04-09 DIAGNOSIS — Z806 Family history of leukemia: Secondary | ICD-10-CM | POA: Diagnosis not present

## 2018-04-09 DIAGNOSIS — F1910 Other psychoactive substance abuse, uncomplicated: Secondary | ICD-10-CM | POA: Diagnosis not present

## 2018-04-09 DIAGNOSIS — I272 Pulmonary hypertension, unspecified: Secondary | ICD-10-CM | POA: Diagnosis not present

## 2018-04-09 DIAGNOSIS — F17210 Nicotine dependence, cigarettes, uncomplicated: Secondary | ICD-10-CM | POA: Diagnosis not present

## 2018-04-09 DIAGNOSIS — Z833 Family history of diabetes mellitus: Secondary | ICD-10-CM | POA: Diagnosis not present

## 2018-04-09 DIAGNOSIS — M1610 Unilateral primary osteoarthritis, unspecified hip: Secondary | ICD-10-CM | POA: Insufficient documentation

## 2018-04-09 DIAGNOSIS — I10 Essential (primary) hypertension: Secondary | ICD-10-CM

## 2018-04-09 LAB — BASIC METABOLIC PANEL
Anion gap: 12 (ref 5–15)
BUN: 9 mg/dL (ref 8–23)
CO2: 19 mmol/L — ABNORMAL LOW (ref 22–32)
Calcium: 8.7 mg/dL — ABNORMAL LOW (ref 8.9–10.3)
Chloride: 107 mmol/L (ref 98–111)
Creatinine, Ser: 1.01 mg/dL (ref 0.61–1.24)
GFR calc Af Amer: >60 mL/min (ref >60)
GFR calc non Af Amer: >60 mL/min (ref >60)
Glucose, Bld: 97 mg/dL (ref 70–99)
Potassium: 3.3 mmol/L — ABNORMAL LOW (ref 3.5–5.1)
Sodium: 138 mmol/L (ref 135–145)

## 2018-04-09 LAB — CBC
HCT: 35.3 % — ABNORMAL LOW (ref 39.0–52.0)
Hemoglobin: 11.2 g/dL — ABNORMAL LOW (ref 13.0–17.0)
MCH: 29.8 pg (ref 26.0–34.0)
MCHC: 31.7 g/dL (ref 30.0–36.0)
MCV: 93.9 fL (ref 80.0–100.0)
Platelets: 185 10*3/uL (ref 150–400)
RBC: 3.76 MIL/uL — ABNORMAL LOW (ref 4.22–5.81)
RDW: 18.4 % — ABNORMAL HIGH (ref 11.5–15.5)
WBC: 5.7 10*3/uL (ref 4.0–10.5)
nRBC: 0 % (ref 0.0–0.2)

## 2018-04-09 MED ORDER — CARVEDILOL 3.125 MG PO TABS: 3.1250 mg | ORAL_TABLET | Freq: Two times a day (BID) | ORAL | 3 refills | Status: DC

## 2018-04-09 MED ORDER — SPIRONOLACTONE 25 MG PO TABS: 12.5000 mg | ORAL_TABLET | Freq: Every day | ORAL | 3 refills | Status: DC

## 2018-04-09 MED FILL — SPIRONOLACTONE 25 MG TABLET: 25 | 30 days supply | Qty: 15 | Fill #0

## 2018-04-09 MED FILL — CARVEDILOL 3.125 MG TABLET: 3.125 | 30 days supply | Qty: 60 | Fill #0

## 2018-04-09 NOTE — Patient Instructions (Signed)
Labs done today. We will contact you for any abnormal labs.  START Spironolactone 12.5mg  (0.5 tab) daily  START Carvedilol 3.125mg  (1 tab) twice daily  Keep Follow up appointment

## 2018-04-09 NOTE — Addendum Note (Signed)
Encounter addended by: Marlise Eves, RN on: 04/09/2018 2:11 PM  Actions taken: Pharmacy for encounter modified, Order list changed
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
## 2018-04-11 ENCOUNTER — Encounter (HOSPITAL_COMMUNITY): Payer: Self-pay | Admitting: Cardiology

## 2018-04-17 NOTE — Addendum Note (Signed)
Encounter addended by: Shirley Friar, PA-C on: 04/17/2018 12:35 PM  Actions taken: LOS modified

## 2018-04-18 ENCOUNTER — Encounter (HOSPITAL_COMMUNITY): Payer: Self-pay | Admitting: Internal Medicine

## 2018-04-18 ENCOUNTER — Ambulatory Visit (HOSPITAL_COMMUNITY)
Admission: RE | Admit: 2018-04-18 | Discharge: 2018-04-18 | Disposition: A | Discharge status: Home / Self Care | Payer: Medicare Other | Source: Ambulatory Visit | Attending: Internal Medicine | Admitting: Internal Medicine

## 2018-04-18 ENCOUNTER — Ambulatory Visit (HOSPITAL_BASED_OUTPATIENT_CLINIC_OR_DEPARTMENT_OTHER)
Admission: RE | Admit: 2018-04-18 | Discharge: 2018-04-18 | Disposition: A | Discharge status: Home / Self Care | Payer: Medicare Other | Source: Ambulatory Visit | Attending: Internal Medicine | Admitting: Internal Medicine

## 2018-04-18 VITALS — BP 130/94 | HR 110 | Wt 202.0 lb

## 2018-04-18 DIAGNOSIS — I5042 Chronic combined systolic (congestive) and diastolic (congestive) heart failure: Secondary | ICD-10-CM | POA: Insufficient documentation

## 2018-04-18 DIAGNOSIS — M1612 Unilateral primary osteoarthritis, left hip: Secondary | ICD-10-CM | POA: Insufficient documentation

## 2018-04-18 DIAGNOSIS — I428 Other cardiomyopathies: Secondary | ICD-10-CM | POA: Insufficient documentation

## 2018-04-18 DIAGNOSIS — K219 Gastro-esophageal reflux disease without esophagitis: Secondary | ICD-10-CM | POA: Insufficient documentation

## 2018-04-18 DIAGNOSIS — N183 Chronic kidney disease, stage 3 (moderate): Secondary | ICD-10-CM | POA: Insufficient documentation

## 2018-04-18 DIAGNOSIS — F329 Major depressive disorder, single episode, unspecified: Secondary | ICD-10-CM | POA: Diagnosis not present

## 2018-04-18 DIAGNOSIS — Z79899 Other long term (current) drug therapy: Secondary | ICD-10-CM | POA: Insufficient documentation

## 2018-04-18 DIAGNOSIS — F17210 Nicotine dependence, cigarettes, uncomplicated: Secondary | ICD-10-CM | POA: Diagnosis not present

## 2018-04-18 DIAGNOSIS — H409 Unspecified glaucoma: Secondary | ICD-10-CM | POA: Diagnosis not present

## 2018-04-18 DIAGNOSIS — M1712 Unilateral primary osteoarthritis, left knee: Secondary | ICD-10-CM | POA: Diagnosis not present

## 2018-04-18 DIAGNOSIS — F1010 Alcohol abuse, uncomplicated: Secondary | ICD-10-CM | POA: Insufficient documentation

## 2018-04-18 DIAGNOSIS — I351 Nonrheumatic aortic (valve) insufficiency: Secondary | ICD-10-CM | POA: Diagnosis not present

## 2018-04-18 DIAGNOSIS — Z8249 Family history of ischemic heart disease and other diseases of the circulatory system: Secondary | ICD-10-CM | POA: Diagnosis not present

## 2018-04-18 DIAGNOSIS — I5022 Chronic systolic (congestive) heart failure: Secondary | ICD-10-CM

## 2018-04-18 DIAGNOSIS — F1011 Alcohol abuse, in remission: Secondary | ICD-10-CM

## 2018-04-18 DIAGNOSIS — I130 Hypertensive heart and chronic kidney disease with heart failure and stage 1 through stage 4 chronic kidney disease, or unspecified chronic kidney disease: Secondary | ICD-10-CM | POA: Diagnosis not present

## 2018-04-18 DIAGNOSIS — J441 Chronic obstructive pulmonary disease with (acute) exacerbation: Secondary | ICD-10-CM | POA: Diagnosis not present

## 2018-04-18 DIAGNOSIS — K5730 Diverticulosis of large intestine without perforation or abscess without bleeding: Secondary | ICD-10-CM | POA: Insufficient documentation

## 2018-04-18 DIAGNOSIS — I10 Essential (primary) hypertension: Secondary | ICD-10-CM

## 2018-04-18 MED ORDER — IVABRADINE HCL 5 MG PO TABS: 5.0000 mg | ORAL_TABLET | Freq: Two times a day (BID) | ORAL | 6 refills | Status: DC

## 2018-04-18 MED ORDER — SACUBITRIL-VALSARTAN 49-51 MG PO TABS: 1.0000 | ORAL_TABLET | Freq: Two times a day (BID) | ORAL | 6 refills | Status: DC

## 2018-04-18 NOTE — Addendum Note (Signed)
Encounter addended by: Marlise Eves, RN on: 04/18/2018 10:38 AM  Actions taken: Clinical Note Signed
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
## 2018-04-18 NOTE — Addendum Note (Signed)
Encounter addended by: Marlise Eves, RN on: 04/18/2018 10:38 AM  Actions taken: Order list changed, Clinical Note Signed

## 2018-04-18 NOTE — Patient Instructions (Addendum)
START Entresto 49-51 (1 tab) twice daily  START Ivabradine 5mg  (1 tab) twice daily  You have been given paper prescriptions for both of these medications.  Follow up with Dr. Haroldine Laws in 4 months

## 2018-04-18 NOTE — Progress Notes (Signed)
  Echocardiogram 2D Echocardiogram has been performed.  Ryan [name] 04/18/2018, 9:51 AM

## 2018-04-26 ENCOUNTER — Encounter: Payer: Self-pay | Admitting: Gastroenterology

## 2018-05-04 IMAGING — DX DG CHEST 2V
2 series · 2 of 2 positions shown · non-contrast
Comparison: None.

CLINICAL DATA: Increased shortness of breath. Chills, sweating,
dizziness, and lightheadedness. Cough, congestion, and left-sided
chest pain for 2-3 days. History of smoking.

EXAM:
CHEST  2 VIEW

[x chest ap]
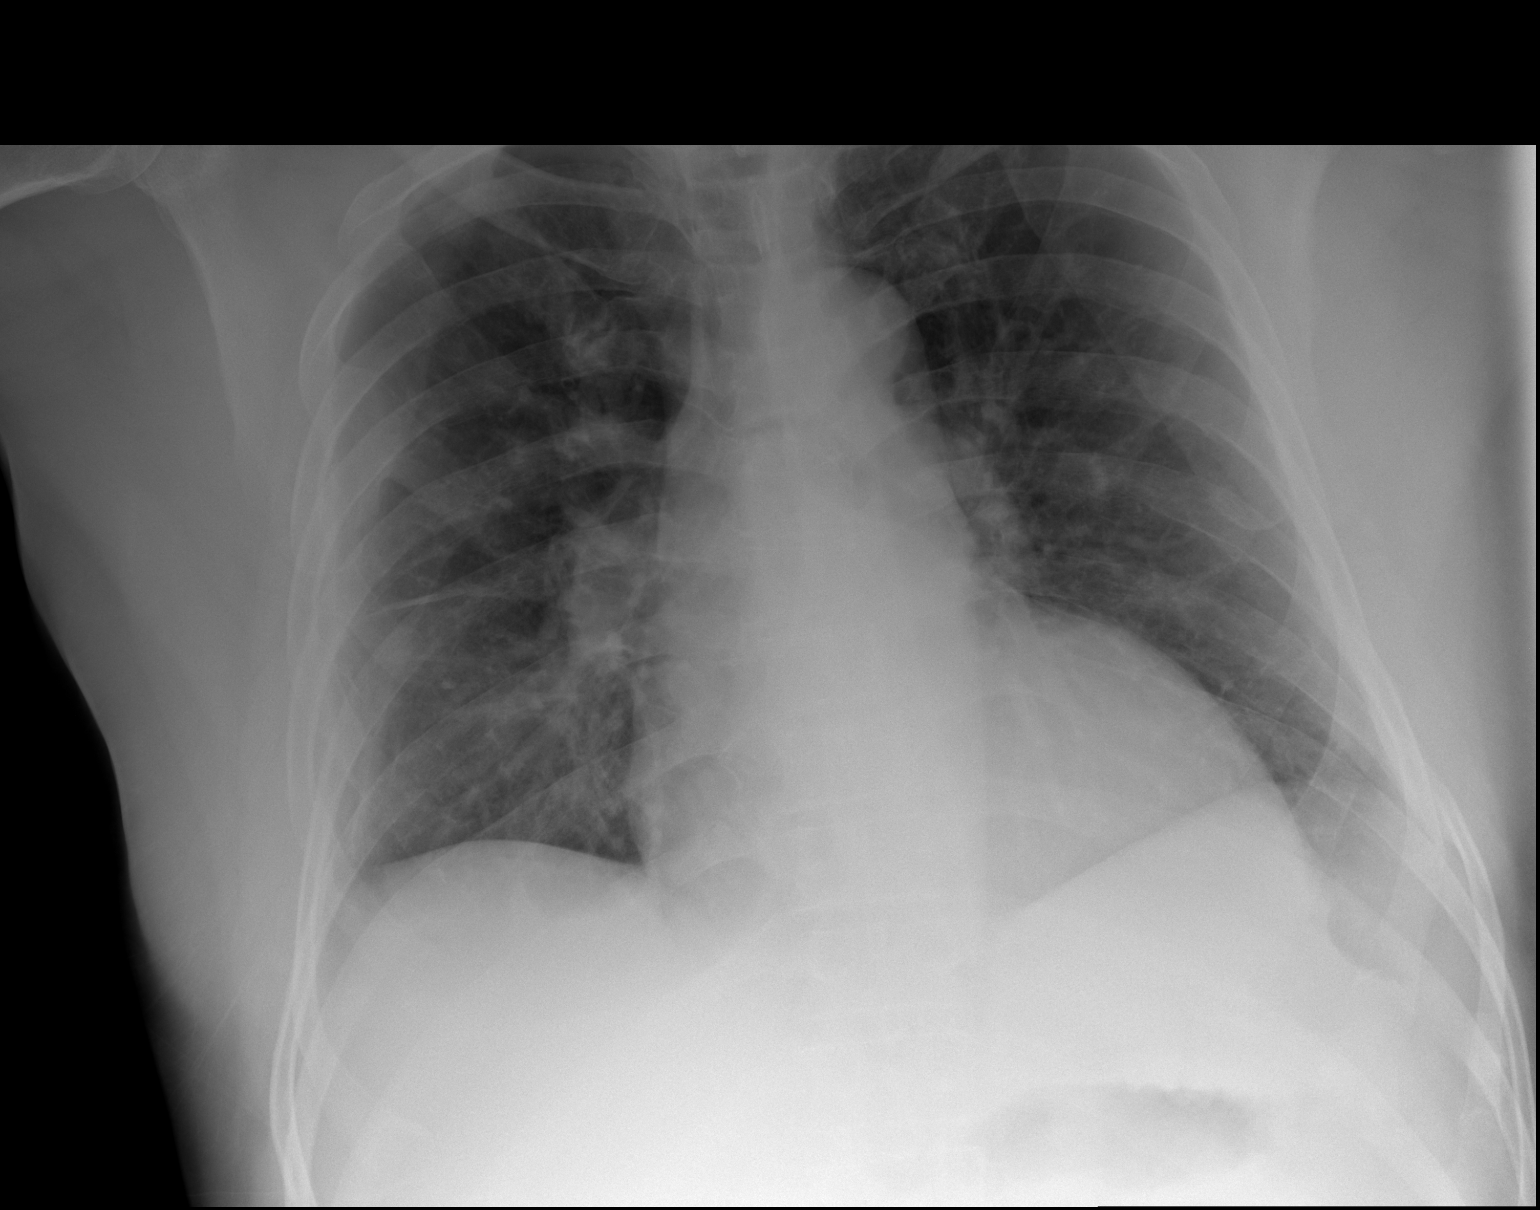

[w chest lat]
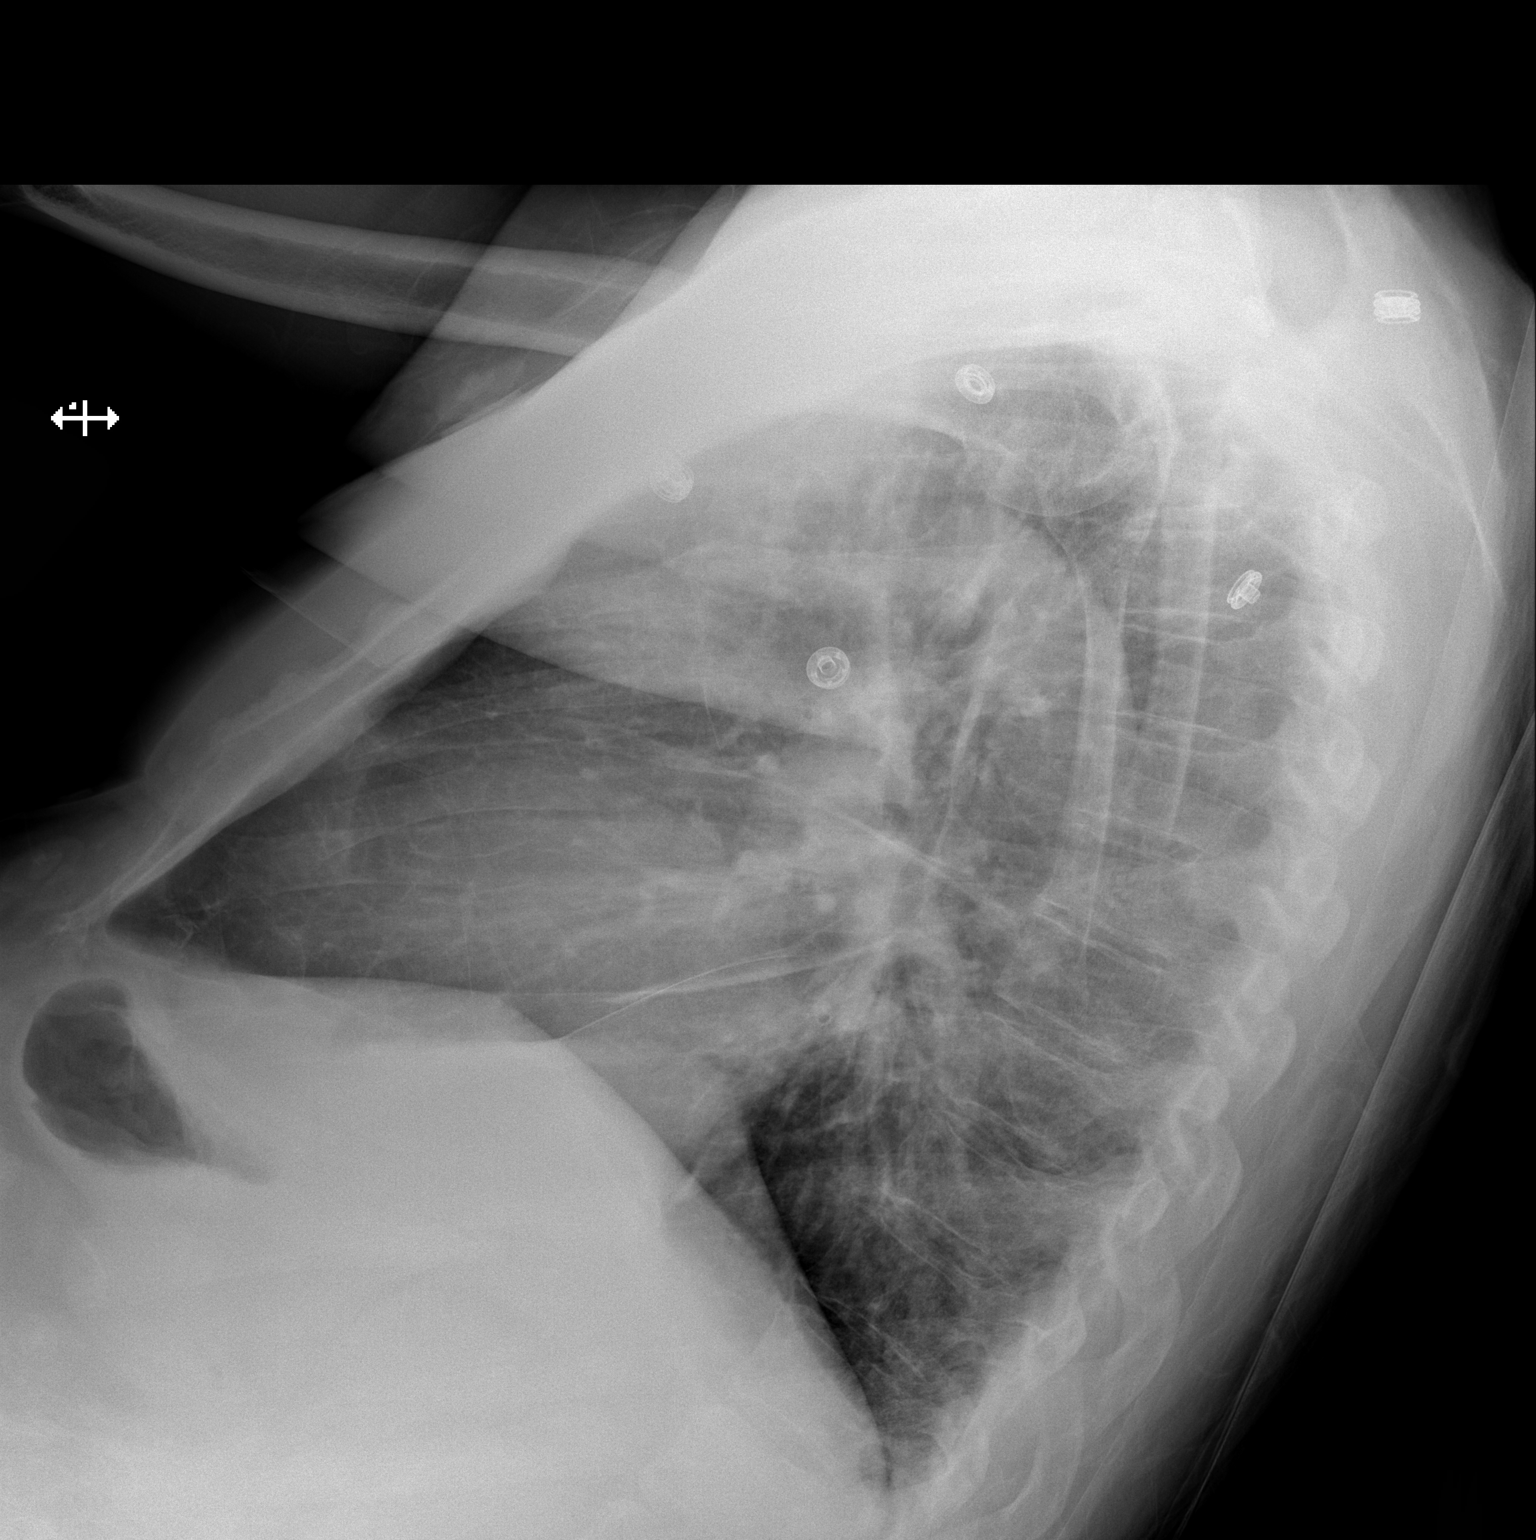

[2 of 2 positions shown; findings below may reference images not displayed]

FINDINGS: Heart is enlarged. There is mild prominence of interstitial
markings, stable since prior study. No focal consolidations or
pleural effusions. No pulmonary edema.
IMPRESSION: No active cardiopulmonary disease.

## 2018-05-08 ENCOUNTER — Encounter: Payer: Medicare Other | Admitting: *Deleted

## 2018-08-09 ENCOUNTER — Telehealth (HOSPITAL_COMMUNITY): Payer: Self-pay | Admitting: Vascular Surgery

## 2018-08-09 NOTE — Telephone Encounter (Signed)
Left pt message that 6/4 appt w/ db will be canceled /pt will be put on waitlist

## 2018-08-15 ENCOUNTER — Encounter (HOSPITAL_COMMUNITY): Payer: Medicare Other | Admitting: Internal Medicine

## 2018-09-09 ENCOUNTER — Other Ambulatory Visit (HOSPITAL_COMMUNITY): Payer: Self-pay

## 2018-09-09 ENCOUNTER — Other Ambulatory Visit: Payer: Self-pay

## 2018-09-09 ENCOUNTER — Encounter: Payer: Medicare Other | Admitting: *Deleted

## 2018-09-09 VITALS — BP 126/85 | HR 92 | Resp 20

## 2018-09-09 DIAGNOSIS — Z006 Encounter for examination for normal comparison and control in clinical research program: Secondary | ICD-10-CM

## 2018-09-09 MED ORDER — SACUBITRIL-VALSARTAN 49-51 MG PO TABS: 1.0000 | ORAL_TABLET | Freq: Two times a day (BID) | ORAL | 6 refills | Status: DC

## 2018-09-09 NOTE — Research (Signed)
Medication Samples have been provided to the patient.  Drug name: Delene Loll       Strength: 24-26mg          Qty: 56 tablets  LOT: [id number] Exp.Date: Jul 2022  Dosing instructions: take 2 tablets twice a day until get new meds from [place]  The patient has been instructed regarding the correct time, dose, and frequency of taking this medication, including desired effects and most common side effects.   Philemon Kingdom D 10:46 AM 09/09/2018

## 2018-09-09 NOTE — Research (Signed)
BISHOP VANDERWERF to research clinic for Week 288, EOSvisit in the [place]. Subject did not return 3 "sleeves" stating that "I took all the meds in them before disposing of them. Labs where drawn and sent off. We thanked him for participating.

## 2018-10-15 ENCOUNTER — Other Ambulatory Visit: Payer: Self-pay

## 2018-10-15 ENCOUNTER — Encounter (HOSPITAL_COMMUNITY): Payer: Self-pay | Admitting: Emergency Medicine

## 2018-10-15 ENCOUNTER — Telehealth: Payer: Self-pay | Admitting: *Deleted

## 2018-10-15 ENCOUNTER — Inpatient Hospital Stay (HOSPITAL_COMMUNITY)
Admission: EM | Admit: 2018-10-15 | Discharge: 2018-10-17 | DRG: 291 | Disposition: A | Discharge status: Home / Self Care | Payer: No Typology Code available for payment source | Attending: Internal Medicine | Admitting: Internal Medicine

## 2018-10-15 ENCOUNTER — Emergency Department (HOSPITAL_COMMUNITY): Payer: No Typology Code available for payment source

## 2018-10-15 DIAGNOSIS — I130 Hypertensive heart and chronic kidney disease with heart failure and stage 1 through stage 4 chronic kidney disease, or unspecified chronic kidney disease: Secondary | ICD-10-CM | POA: Diagnosis not present

## 2018-10-15 DIAGNOSIS — E7800 Pure hypercholesterolemia, unspecified: Secondary | ICD-10-CM | POA: Diagnosis present

## 2018-10-15 DIAGNOSIS — I10 Essential (primary) hypertension: Secondary | ICD-10-CM | POA: Diagnosis present

## 2018-10-15 DIAGNOSIS — Z8249 Family history of ischemic heart disease and other diseases of the circulatory system: Secondary | ICD-10-CM

## 2018-10-15 DIAGNOSIS — Z20828 Contact with and (suspected) exposure to other viral communicable diseases: Secondary | ICD-10-CM | POA: Diagnosis present

## 2018-10-15 DIAGNOSIS — G8929 Other chronic pain: Secondary | ICD-10-CM | POA: Diagnosis present

## 2018-10-15 DIAGNOSIS — N183 Chronic kidney disease, stage 3 unspecified: Secondary | ICD-10-CM | POA: Diagnosis present

## 2018-10-15 DIAGNOSIS — R000 Tachycardia, unspecified: Secondary | ICD-10-CM | POA: Diagnosis not present

## 2018-10-15 DIAGNOSIS — H409 Unspecified glaucoma: Secondary | ICD-10-CM | POA: Diagnosis present

## 2018-10-15 DIAGNOSIS — Z9114 Patient's other noncompliance with medication regimen: Secondary | ICD-10-CM

## 2018-10-15 DIAGNOSIS — I428 Other cardiomyopathies: Secondary | ICD-10-CM | POA: Diagnosis present

## 2018-10-15 DIAGNOSIS — Z86010 Personal history of colonic polyps: Secondary | ICD-10-CM | POA: Diagnosis not present

## 2018-10-15 DIAGNOSIS — J449 Chronic obstructive pulmonary disease, unspecified: Secondary | ICD-10-CM | POA: Diagnosis not present

## 2018-10-15 DIAGNOSIS — Z79899 Other long term (current) drug therapy: Secondary | ICD-10-CM

## 2018-10-15 DIAGNOSIS — F1010 Alcohol abuse, uncomplicated: Secondary | ICD-10-CM | POA: Diagnosis present

## 2018-10-15 DIAGNOSIS — F329 Major depressive disorder, single episode, unspecified: Secondary | ICD-10-CM | POA: Diagnosis present

## 2018-10-15 DIAGNOSIS — I5022 Chronic systolic (congestive) heart failure: Secondary | ICD-10-CM

## 2018-10-15 DIAGNOSIS — M1990 Unspecified osteoarthritis, unspecified site: Secondary | ICD-10-CM | POA: Diagnosis present

## 2018-10-15 DIAGNOSIS — Z91048 Other nonmedicinal substance allergy status: Secondary | ICD-10-CM

## 2018-10-15 DIAGNOSIS — M545 Low back pain: Secondary | ICD-10-CM | POA: Diagnosis present

## 2018-10-15 DIAGNOSIS — I509 Heart failure, unspecified: Secondary | ICD-10-CM | POA: Diagnosis not present

## 2018-10-15 DIAGNOSIS — R05 Cough: Secondary | ICD-10-CM | POA: Diagnosis not present

## 2018-10-15 DIAGNOSIS — K219 Gastro-esophageal reflux disease without esophagitis: Secondary | ICD-10-CM | POA: Diagnosis present

## 2018-10-15 DIAGNOSIS — R079 Chest pain, unspecified: Secondary | ICD-10-CM

## 2018-10-15 DIAGNOSIS — F17210 Nicotine dependence, cigarettes, uncomplicated: Secondary | ICD-10-CM | POA: Diagnosis present

## 2018-10-15 DIAGNOSIS — I5042 Chronic combined systolic (congestive) and diastolic (congestive) heart failure: Secondary | ICD-10-CM | POA: Diagnosis present

## 2018-10-15 DIAGNOSIS — I272 Pulmonary hypertension, unspecified: Secondary | ICD-10-CM | POA: Diagnosis present

## 2018-10-15 DIAGNOSIS — I5043 Acute on chronic combined systolic (congestive) and diastolic (congestive) heart failure: Secondary | ICD-10-CM | POA: Diagnosis not present

## 2018-10-15 DIAGNOSIS — N182 Chronic kidney disease, stage 2 (mild): Secondary | ICD-10-CM | POA: Diagnosis present

## 2018-10-15 DIAGNOSIS — I5031 Acute diastolic (congestive) heart failure: Secondary | ICD-10-CM | POA: Diagnosis not present

## 2018-10-15 DIAGNOSIS — I110 Hypertensive heart disease with heart failure: Secondary | ICD-10-CM | POA: Diagnosis not present

## 2018-10-15 DIAGNOSIS — E876 Hypokalemia: Secondary | ICD-10-CM | POA: Diagnosis not present

## 2018-10-15 DIAGNOSIS — Z720 Tobacco use: Secondary | ICD-10-CM | POA: Diagnosis not present

## 2018-10-15 DIAGNOSIS — Z7289 Other problems related to lifestyle: Secondary | ICD-10-CM | POA: Diagnosis not present

## 2018-10-15 LAB — CBC WITH DIFFERENTIAL/PLATELET
Abs Immature Granulocytes: 0.02 10*3/uL (ref 0.00–0.07)
Basophils Absolute: 0 10*3/uL (ref 0.0–0.1)
Basophils Relative: 1 %
Eosinophils Absolute: 0.2 10*3/uL (ref 0.0–0.5)
Eosinophils Relative: 4 %
HCT: 40.2 % (ref 39.0–52.0)
Hemoglobin: 13.3 g/dL (ref 13.0–17.0)
Immature Granulocytes: 0 %
Lymphocytes Relative: 20 %
Lymphs Abs: 1.2 10*3/uL (ref 0.7–4.0)
MCH: 31.7 pg (ref 26.0–34.0)
MCHC: 33.1 g/dL (ref 30.0–36.0)
MCV: 95.9 fL (ref 80.0–100.0)
Monocytes Absolute: 0.6 10*3/uL (ref 0.1–1.0)
Monocytes Relative: 10 %
Neutro Abs: 3.7 10*3/uL (ref 1.7–7.7)
Neutrophils Relative %: 65 %
Platelets: 172 10*3/uL (ref 150–400)
RBC: 4.19 MIL/uL — ABNORMAL LOW (ref 4.22–5.81)
RDW: 17.2 % — ABNORMAL HIGH (ref 11.5–15.5)
WBC: 5.7 10*3/uL (ref 4.0–10.5)
nRBC: 0.4 % — ABNORMAL HIGH (ref 0.0–0.2)

## 2018-10-15 LAB — BASIC METABOLIC PANEL
Anion gap: 12 (ref 5–15)
BUN: 10 mg/dL (ref 8–23)
CO2: 20 mmol/L — ABNORMAL LOW (ref 22–32)
Calcium: 9.4 mg/dL (ref 8.9–10.3)
Chloride: 104 mmol/L (ref 98–111)
Creatinine, Ser: 1.24 mg/dL (ref 0.61–1.24)
GFR calc Af Amer: >60 mL/min (ref >60)
GFR calc non Af Amer: 59 mL/min — ABNORMAL LOW (ref >60)
Glucose, Bld: 85 mg/dL (ref 70–99)
Potassium: 3.5 mmol/L (ref 3.5–5.1)
Sodium: 136 mmol/L (ref 135–145)

## 2018-10-15 LAB — RAPID URINE DRUG SCREEN, HOSP PERFORMED
Amphetamines: NOT DETECTED
Barbiturates: NOT DETECTED
Benzodiazepines: NOT DETECTED
Cocaine: NOT DETECTED
Opiates: NOT DETECTED
Tetrahydrocannabinol: NOT DETECTED

## 2018-10-15 LAB — TROPONIN I (HIGH SENSITIVITY)
Troponin I (High Sensitivity): 21 ng/L — ABNORMAL HIGH
Troponin I (High Sensitivity): 17 ng/L (ref <18)

## 2018-10-15 LAB — SARS CORONAVIRUS 2 BY RT PCR (HOSPITAL ORDER, PERFORMED IN ~~LOC~~ HOSPITAL LAB): SARS Coronavirus 2: NEGATIVE

## 2018-10-15 LAB — BRAIN NATRIURETIC PEPTIDE: B Natriuretic Peptide: 535.5 pg/mL — ABNORMAL HIGH (ref 0.0–100.0)

## 2018-10-15 LAB — D-DIMER, QUANTITATIVE: D-Dimer, Quant: 0.3 ug{FEU}/mL (ref 0.00–0.50)

## 2018-10-15 MED ORDER — SACUBITRIL-VALSARTAN 24-26 MG PO TABS
1.0000 | ORAL_TABLET | Freq: Two times a day (BID) | ORAL | Status: DC
  Administered 2018-10-15 – 2018-10-17 (×4): 1 via ORAL
  Filled 2018-10-15 (×4): qty 1

## 2018-10-15 MED ORDER — FUROSEMIDE 20 MG PO TABS
40.0000 mg | ORAL_TABLET | Freq: Every day | ORAL | Status: DC
  Administered 2018-10-16: 40 mg via ORAL
  Filled 2018-10-15: qty 2

## 2018-10-15 MED ORDER — SPIRONOLACTONE 12.5 MG HALF TABLET
12.5000 mg | ORAL_TABLET | Freq: Every day | ORAL | Status: DC
  Administered 2018-10-16 – 2018-10-17 (×2): 12.5 mg via ORAL
  Filled 2018-10-15 (×2): qty 1

## 2018-10-15 MED ORDER — FUROSEMIDE 10 MG/ML IJ SOLN
40.0000 mg | Freq: Once | INTRAMUSCULAR | Status: AC
  Administered 2018-10-15: 40 mg via INTRAVENOUS
  Filled 2018-10-15: qty 4

## 2018-10-15 MED ORDER — SODIUM CHLORIDE 0.9% FLUSH
3.0000 mL | Freq: Two times a day (BID) | INTRAVENOUS | Status: DC
  Administered 2018-10-15 – 2018-10-17 (×4): 3 mL via INTRAVENOUS

## 2018-10-15 MED ORDER — ENOXAPARIN SODIUM 40 MG/0.4ML ~~LOC~~ SOLN
40.0000 mg | SUBCUTANEOUS | Status: DC
  Filled 2018-10-15: qty 0.4

## 2018-10-15 MED ORDER — PANTOPRAZOLE SODIUM 40 MG PO TBEC
40.0000 mg | DELAYED_RELEASE_TABLET | Freq: Two times a day (BID) | ORAL | Status: DC
  Administered 2018-10-15 – 2018-10-17 (×4): 40 mg via ORAL
  Filled 2018-10-15 (×4): qty 1

## 2018-10-15 MED ORDER — ADULT MULTIVITAMIN W/MINERALS CH
1.0000 | ORAL_TABLET | Freq: Every day | ORAL | Status: DC
  Administered 2018-10-15 – 2018-10-17 (×3): 1 via ORAL
  Filled 2018-10-15 (×3): qty 1

## 2018-10-15 MED ORDER — ACETAMINOPHEN 650 MG RE SUPP: 650.0000 mg | Freq: Four times a day (QID) | RECTAL | Status: DC | PRN

## 2018-10-15 MED ORDER — FOLIC ACID 1 MG PO TABS
1.0000 mg | ORAL_TABLET | Freq: Every day | ORAL | Status: DC
  Administered 2018-10-15 – 2018-10-17 (×3): 1 mg via ORAL
  Filled 2018-10-15 (×3): qty 1

## 2018-10-15 MED ORDER — VITAMIN B-1 100 MG PO TABS
100.0000 mg | ORAL_TABLET | Freq: Every day | ORAL | Status: DC
  Administered 2018-10-16 – 2018-10-17 (×2): 100 mg via ORAL
  Filled 2018-10-15 (×2): qty 1

## 2018-10-15 MED ORDER — SODIUM CHLORIDE 0.9% FLUSH: 3.0000 mL | Freq: Once | INTRAVENOUS | Status: DC

## 2018-10-15 MED ORDER — ACETAMINOPHEN 325 MG PO TABS
650.0000 mg | ORAL_TABLET | Freq: Four times a day (QID) | ORAL | Status: DC | PRN
  Administered 2018-10-16: 650 mg via ORAL
  Filled 2018-10-15 (×2): qty 2

## 2018-10-15 MED ORDER — GABAPENTIN 300 MG PO CAPS
300.0000 mg | ORAL_CAPSULE | Freq: Three times a day (TID) | ORAL | Status: DC
  Administered 2018-10-15 – 2018-10-17 (×6): 300 mg via ORAL
  Filled 2018-10-15 (×6): qty 1

## 2018-10-15 MED ORDER — THIAMINE HCL 100 MG/ML IJ SOLN: 100.0000 mg | Freq: Every day | INTRAMUSCULAR | Status: DC
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
## 2018-10-15 NOTE — ED Notes (Deleted)
Patient verbalizes understanding of discharge instructions . Opportunity for questions and answers were provided . Armband removed by staff ,Pt discharged from ED. W/C  offered at D/C  and Declined W/C at D/C and was escorted to lobby by RN.  

## 2018-10-15 NOTE — Telephone Encounter (Signed)
Patient called the research office with complaints of sob and chest pain.  He stated that he was working outside yesterday and all night he was having chest pain and sob. His sister was checking on him throughout the night.  This morning he is still complaining of sob and chest pain and he wanted to know what to do. I have recommended that if need to, call EMS and get transported to the hospital ED.  He said he is preferring that his sister carry him to the hospital.  Verbalized understanding that he should go to the hospital immediately.
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
## 2018-10-15 NOTE — ED Triage Notes (Signed)
Pt reports feeling SOB with chest pain off and on for a while but got worse over the last 2 days.

## 2018-10-15 NOTE — ED Notes (Signed)
Left sided chest pain that radiates to left shoulder and back.  Pt states "I have had the pain for a while but has worsened over the past few days".

## 2018-10-16 ENCOUNTER — Inpatient Hospital Stay (HOSPITAL_COMMUNITY): Payer: No Typology Code available for payment source

## 2018-10-16 DIAGNOSIS — J449 Chronic obstructive pulmonary disease, unspecified: Secondary | ICD-10-CM

## 2018-10-16 DIAGNOSIS — Z720 Tobacco use: Secondary | ICD-10-CM

## 2018-10-16 DIAGNOSIS — F1010 Alcohol abuse, uncomplicated: Secondary | ICD-10-CM

## 2018-10-16 DIAGNOSIS — I130 Hypertensive heart and chronic kidney disease with heart failure and stage 1 through stage 4 chronic kidney disease, or unspecified chronic kidney disease: Principal | ICD-10-CM

## 2018-10-16 DIAGNOSIS — I5043 Acute on chronic combined systolic (congestive) and diastolic (congestive) heart failure: Secondary | ICD-10-CM

## 2018-10-16 DIAGNOSIS — I5022 Chronic systolic (congestive) heart failure: Secondary | ICD-10-CM

## 2018-10-16 DIAGNOSIS — Z79899 Other long term (current) drug therapy: Secondary | ICD-10-CM

## 2018-10-16 DIAGNOSIS — Z9114 Patient's other noncompliance with medication regimen: Secondary | ICD-10-CM

## 2018-10-16 DIAGNOSIS — I509 Heart failure, unspecified: Secondary | ICD-10-CM

## 2018-10-16 DIAGNOSIS — N183 Chronic kidney disease, stage 3 (moderate): Secondary | ICD-10-CM

## 2018-10-16 DIAGNOSIS — R079 Chest pain, unspecified: Secondary | ICD-10-CM

## 2018-10-16 DIAGNOSIS — E876 Hypokalemia: Secondary | ICD-10-CM

## 2018-10-16 DIAGNOSIS — I5031 Acute diastolic (congestive) heart failure: Secondary | ICD-10-CM

## 2018-10-16 DIAGNOSIS — Z91048 Other nonmedicinal substance allergy status: Secondary | ICD-10-CM

## 2018-10-16 DIAGNOSIS — Z7289 Other problems related to lifestyle: Secondary | ICD-10-CM

## 2018-10-16 LAB — BASIC METABOLIC PANEL
Anion gap: 11 (ref 5–15)
BUN: 12 mg/dL (ref 8–23)
CO2: 22 mmol/L (ref 22–32)
Calcium: 8.9 mg/dL (ref 8.9–10.3)
Chloride: 106 mmol/L (ref 98–111)
Creatinine, Ser: 1.15 mg/dL (ref 0.61–1.24)
GFR calc Af Amer: >60 mL/min (ref >60)
GFR calc non Af Amer: >60 mL/min (ref >60)
Glucose, Bld: 108 mg/dL — ABNORMAL HIGH (ref 70–99)
Potassium: 3.4 mmol/L — ABNORMAL LOW (ref 3.5–5.1)
Sodium: 139 mmol/L (ref 135–145)

## 2018-10-16 LAB — DIGOXIN LEVEL: Digoxin Level: <0.2 ng/mL — ABNORMAL LOW (ref 0.8–2.0)

## 2018-10-16 LAB — MAGNESIUM: Magnesium: 2.1 mg/dL (ref 1.7–2.4)

## 2018-10-16 LAB — ECHOCARDIOGRAM COMPLETE
Height: 71 in
Weight: 3131.2 oz

## 2018-10-16 MED ORDER — FUROSEMIDE 10 MG/ML IJ SOLN
60.0000 mg | Freq: Once | INTRAMUSCULAR | Status: AC
  Administered 2018-10-16: 60 mg via INTRAVENOUS
  Filled 2018-10-16: qty 6

## 2018-10-16 MED ORDER — POTASSIUM CHLORIDE CRYS ER 20 MEQ PO TBCR
40.0000 meq | EXTENDED_RELEASE_TABLET | Freq: Two times a day (BID) | ORAL | Status: AC
  Administered 2018-10-16 (×2): 40 meq via ORAL
  Filled 2018-10-16 (×2): qty 2

## 2018-10-16 MED ORDER — PERFLUTREN LIPID MICROSPHERE
1.0000 mL | INTRAVENOUS | Status: AC | PRN
  Administered 2018-10-16: 1 mL via INTRAVENOUS
  Administered 2018-10-16: 2 mL via INTRAVENOUS
  Filled 2018-10-16: qty 10

## 2018-10-16 MED ORDER — DIGOXIN 125 MCG PO TABS
0.1250 mg | ORAL_TABLET | Freq: Every day | ORAL | Status: DC
  Administered 2018-10-16 – 2018-10-17 (×2): 0.125 mg via ORAL
  Filled 2018-10-16 (×2): qty 1

## 2018-10-16 NOTE — Addendum Note (Signed)
Addended by: Star Age D on: 10/16/2018 11:16 AM   Modules accepted: Orders

## 2018-10-16 NOTE — Progress Notes (Signed)
  Echocardiogram 2D Echocardiogram has been performed.  Jennette Dubin 10/16/2018, 11:56 AM

## 2018-10-16 NOTE — Progress Notes (Signed)
   Subjective: Ryan [name] is a 68 year old male with a history of HTN, CKD stage III, AUD, and HFrEF who presented with chest pain and shortness of breath. He is on hospital admission day 1 for acute decompensated heart failure most likely secondary to medication non-compliance due [facility] with [facility].   Ryan [name] states that he is feeling better than he was upon presentation yesterday but is still short of breath and keeping the head of the bed elevated to avoid worsening his SOB. He complains of some mild chest pain, also improved from yesterday. He has been ambulating in the hallway, which exacerbates his SOB.   Objective:  Vital signs in last 24 hours: Vitals:   10/16/18 0453 10/16/18 0558 10/16/18 0750 10/16/18 1147  BP: 110/83  106/78 117/82  Pulse: 85  93 95  Resp: 20  20 20   Temp: 97.7 F (36.5 C)  (!) 97.4 F (36.3 C) 98.4 F (36.9 C)  TempSrc: Oral  Oral Oral  SpO2: 96%  98% 100%  Weight:  88.8 kg    Height:       Weight change:   Intake/Output Summary (Last 24 hours) at 10/16/2018 1248 Last data filed at 10/16/2018 1148 Gross per 24 hour  Intake 480 ml  Output 500 ml  Net -20 ml   Physical Exam:  General: No acute distress, sitting comfortably in bed HEENT: NCAT, conjunctivae clear CV: RRR, No m/g/r appreciated. No JVD. Extremities warm and well-perfused, no edema.  Pulm: LCTAB, slightly increased work of breathing Neuro: A/Ox3  Bedside ultrasound (10/15/18): 2-3 B lines on lung imaging suggest some fluid accumulation. Decreased heart contractility evident.    BMP Latest Ref Rng & Units 10/16/2018 10/15/2018 04/09/2018  Glucose 70 - 99 mg/dL 108(H) 85 97  BUN 8 - 23 mg/dL 12 10 9   Creatinine 0.61 - 1.24 mg/dL 1.15 1.24 1.01  Sodium 135 - 145 mmol/L 139 136 138  Potassium 3.5 - 5.1 mmol/L 3.4(L) 3.5 3.3(L)  Chloride 98 - 111 mmol/L 106 104 107  CO2 22 - 32 mmol/L 22 20(L) 19(L)  Calcium 8.9 - 10.3 mg/dL 8.9 9.4 8.7(L)      Assessment/Plan:  Principal Problem:   Acute decompensated heart failure (HCC) Active Problems:   Essential hypertension   CKD (chronic kidney disease), stage III (HCC)   Chronic combined systolic and diastolic CHF (congestive heart failure) (HCC)  Heart Failure Exacerbation: - Last echo (04/2018) with EF 15-20%. Repeat echo - Digoxin <0.2. Restart home digoxin 0.125 mg - Lasix 40 mg, Entresto, spironolactone 12.5mg  - Blood cultures to evaluate for bacteremia (pending)  CKD: - GFR >60 today, continue to monitor  Alcohol use disorder: - CIWA protocol  Hypertension:  - currently well-controlled (last recorded 110/83) with Lasix 40 mg and Entresto  F: restricted E: hypokalemic to 3.4 today, replete as needed N: heart healthy diet  DVT prophylaxis: Lovenox scheduled, not given 8/4      LOS: 1 day   Kendell Bane, Medical Student 10/16/2018, 12:48 PM
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
## 2018-10-16 NOTE — Progress Notes (Addendum)
Pt stated he updated his family. Made pt aware of new visitor guidelines.

## 2018-10-16 NOTE — TOC Initial Note (Addendum)
Transition of Care [facility]) - Initial/Assessment Note    Patient Details  Name: Ryan [name] MRN: [id number] Date of Birth: Jul 23, 1950  [facility] CM/SW Contact:    Marilu Favre, RN Phone Number: 10/16/2018, 4:21 PM  Clinical Narrative:                  Spoke to patient . He gets his medications through [facility] ( only prescription coverage he has). Per patient heart Failure clinic fax scripts and clinical to his [place] PCP, however, patient was told by his [place] PCP that fax was never received.   Patient does not know his [place] PCP name or number.    NCM called [facility] Notification line . Notification ID number is [id number], Referral ID number [id number]. Was told PCP is [facility] Horns D 1 phone [phone] called same no answer. Also called [phone],UXNAT to [facility] D 1 closes at 1615 , spoke to [place] in [place]. Collie Siad instructed NCM to call [phone] ext [phone] and speak to the AOD. Called same spoke to [place] . Clinical information will need to be faxed to [facility] at [phone]. At discharge patient needs to take hard copies of his prescriptions to [facility] Emergency  Room  , the ER MD will rewrite the prescriptions and the [facility] will fill prescriptions.   Patient aware and states he has transportation to [facility]   Patient lives with his girl friend at [place] . Address on face sheet is his sister's address which he uses for his mailing address.  Expected Discharge Plan: Home/Self Care Barriers to Discharge: Continued Medical Work up   Patient Goals and CMS Choice Patient states their goals for this hospitalization and ongoing recovery are:: to go home CMS Medicare.gov Compare Post Acute Care list provided to:: Patient Choice offered to / list presented to : Patient  Expected Discharge Plan and Services Expected Discharge Plan: Home/Self Care   Discharge Planning Services: CM Consult,  Medication Assistance   Living arrangements for the past 2 months: Single Family Home                 DME Arranged: N/A                    Prior Living Arrangements/Services Living arrangements for the past 2 months: Single Family Home Lives with:: Significant Other Patient language and need for interpreter reviewed:: Yes Do you feel safe going back to the place where you live?: Yes      Need for Family Participation in Patient Care: Yes (Comment) Care giver support system in place?: Yes (comment)   Criminal Activity/Legal Involvement Pertinent to Current Situation/Hospitalization: No - Comment as needed  Activities of Daily Living Home Assistive Devices/Equipment: Crutches ADL Screening (condition at time of admission) Patient's cognitive ability adequate to safely complete daily activities?: Yes Is the patient deaf or have difficulty hearing?: No Does the patient have difficulty seeing, even when wearing glasses/contacts?: No Does the patient have difficulty concentrating, remembering, or making decisions?: No Patient able to express need for assistance with ADLs?: Yes Does the patient have difficulty dressing or bathing?: No Independently performs ADLs?: Yes (appropriate for developmental age) Does the patient have difficulty walking or climbing stairs?: Yes Weakness of Legs: Left Weakness of Arms/Hands: None  Permission Sought/Granted   Permission granted to share information with : No  Emotional Assessment   Attitude/Demeanor/Rapport: Engaged Affect (typically observed): Accepting Orientation: : Oriented to Self, Oriented to Place, Oriented to  Time, Oriented to Situation Alcohol / Substance Use: Not Applicable Psych Involvement: No (comment)  Admission diagnosis:  Chest pain, unspecified type [R07.9] Acute on chronic congestive heart failure, unspecified heart failure type [facility]) [I50.9] Patient Active Problem List   Diagnosis Date Noted  .  Acute on chronic congestive heart failure ([place])   . Acute decompensated heart failure ([place]) 10/15/2018  . Bleeding hemorrhoids 03/07/2018  . Adenomatous polyp of descending colon   . GI bleed 03/05/2018  . Hx of chronic pancreatitis   . Nonischemic cardiomyopathy ([place])   . Duodenal ulcer   . Hematochezia   . Chronic combined systolic and diastolic CHF (congestive heart failure) ([place]) 07/13/2016  . Alcohol abuse   . Alcohol-induced acute pancreatitis without infection or necrosis   . AKI (acute kidney injury) ([place]) 11/27/2015  . Polysubstance abuse ([place]) 11/27/2015  . COPD (chronic obstructive pulmonary disease) ([place]) 12/19/2013  . CKD (chronic kidney disease), stage III ([place]) 12/03/2013  . HTN (hypertension) 12/03/2013  . Post-traumatic osteoarthritis of left hip 11/26/2013  . Post-traumatic osteoarthritis of left knee 11/26/2013  . Hyperlipidemia 11/26/2013  . Glaucoma 04/23/2013  . Low back pain 08/29/2010  . Tobacco use disorder 07/08/2010  . Essential hypertension 05/16/2010  . DYSPNEA 05/16/2010   PCP:  [facility] [place]:   [place], [place] - [address] [place] [address] Phone: [phone] Fax: [phone]  [id number] [place], [place] [address] [place] [address] Phone: [phone] Fax: [phone]     Social Determinants of Health (SDOH) Interventions    Readmission Risk Interventions No flowsheet data found.

## 2018-10-17 DIAGNOSIS — I428 Other cardiomyopathies: Secondary | ICD-10-CM

## 2018-10-17 LAB — BASIC METABOLIC PANEL
Anion gap: 12 (ref 5–15)
BUN: 19 mg/dL (ref 8–23)
CO2: 24 mmol/L (ref 22–32)
Calcium: 9.1 mg/dL (ref 8.9–10.3)
Chloride: 101 mmol/L (ref 98–111)
Creatinine, Ser: 1.41 mg/dL — ABNORMAL HIGH (ref 0.61–1.24)
GFR calc Af Amer: 58 mL/min — ABNORMAL LOW (ref >60)
GFR calc non Af Amer: 50 mL/min — ABNORMAL LOW (ref >60)
Glucose, Bld: 109 mg/dL — ABNORMAL HIGH (ref 70–99)
Potassium: 3.6 mmol/L (ref 3.5–5.1)
Sodium: 137 mmol/L (ref 135–145)

## 2018-10-17 LAB — MAGNESIUM: Magnesium: 2 mg/dL (ref 1.7–2.4)

## 2018-10-17 MED ORDER — SPIRONOLACTONE 25 MG PO TABS: 12.5000 mg | ORAL_TABLET | Freq: Every day | ORAL | 0 refills | Status: DC

## 2018-10-17 MED ORDER — CARVEDILOL 3.125 MG PO TABS: 3.1250 mg | ORAL_TABLET | Freq: Two times a day (BID) | ORAL | 0 refills | Status: DC

## 2018-10-17 MED ORDER — SACUBITRIL-VALSARTAN 24-26 MG PO TABS: 1.0000 | ORAL_TABLET | Freq: Two times a day (BID) | ORAL | 0 refills | Status: DC

## 2018-10-17 MED ORDER — FUROSEMIDE 40 MG PO TABS: 40.0000 mg | ORAL_TABLET | Freq: Every day | ORAL | 1 refills | Status: DC

## 2018-10-17 MED ORDER — DIGOXIN 125 MCG PO TABS: 0.1250 mg | ORAL_TABLET | Freq: Every day | ORAL | 0 refills | Status: DC

## 2018-10-17 MED FILL — DIGOXIN 0.125 MG TABLET: 125 | 30 days supply | Qty: 30 | Fill #0

## 2018-10-17 MED FILL — CARVEDILOL 3.125 MG TABLET: 3.125 | 30 days supply | Qty: 60 | Fill #0

## 2018-10-17 MED FILL — FUROSEMIDE 40 MG TABLET: 40 | 30 days supply | Qty: 30 | Fill #0

## 2018-10-17 MED FILL — SPIRONOLACTONE 25 MG TABLET: 25 | 30 days supply | Qty: 15 | Fill #0

## 2018-10-17 NOTE — Progress Notes (Signed)
Discharged information and medications were given to pt. Pt was transported down to hospital main entrance where pt's family was waiting via wheelchair by NT.

## 2018-10-17 NOTE — Discharge Summary (Signed)
Name: Ryan [name] MRN: [id number] DOB: 1951/01/27 68 y.o. PCP: [facility] [place]  Date of Admission: 10/15/2018 12:05 PM Date of Discharge: 10/17/2018 Attending Physician: Lucious Groves, DO  Discharge Diagnosis: 1. Acute Decompensated Heart Failure   Discharge Medications: Allergies as of 10/17/2018      Reactions   Tape Other (See Comments)   "TAPE TEARS OFF MY SKIN"      Medication List    STOP taking these medications   hydrocortisone 25 MG suppository Commonly known as: ANUSOL-HC   LORazepam 1 MG tablet Commonly known as: Ativan   sacubitril-valsartan 49-51 MG Commonly known as: ENTRESTO Replaced by: sacubitril-valsartan 24-26 MG     TAKE these medications   acetaminophen 325 MG tablet Commonly known as: TYLENOL Take 650 mg by mouth every 6 (six) hours as needed for mild pain.   aspirin EC 81 MG tablet Take 81 mg by mouth as needed ("for heart health").   carvedilol 3.125 MG tablet Commonly known as: COREG Take 1 tablet (3.125 mg total) by mouth 2 (two) times daily.   diclofenac sodium 1 % Gel Commonly known as: VOLTAREN Apply 1 application topically 2 (two) times daily as needed (to painful sites).   digoxin 0.125 MG tablet Commonly known as: LANOXIN Take 1 tablet (0.125 mg total) by mouth daily.   furosemide 40 MG tablet Commonly known as: LASIX Take 1 tablet (40 mg total) by mouth daily.   gabapentin 300 MG capsule Commonly known as: NEURONTIN Take 300 mg by mouth 2 (two) times daily as needed (for nerve pain).   ivabradine 5 MG Tabs tablet Commonly known as: CORLANOR Take 1 tablet (5 mg total) by mouth 2 (two) times daily with a meal.   loratadine 10 MG tablet Commonly known as: CLARITIN Take 10 mg by mouth at bedtime.   pantoprazole 40 MG tablet Commonly known as: PROTONIX Take 1 tablet (40 mg total) by mouth 2 (two) times daily.   ProAir HFA 108 (90 Base) MCG/ACT inhaler Generic drug: albuterol Inhale 2 puffs into the  lungs every 6 (six) hours as needed for wheezing or shortness of breath.   psyllium 95 % Pack Commonly known as: HYDROCIL/METAMUCIL Take 1 packet by mouth daily.   sacubitril-valsartan 24-26 MG Commonly known as: ENTRESTO Take 1 tablet by mouth 2 (two) times daily. Replaces: sacubitril-valsartan 49-51 MG   spironolactone 25 MG tablet Commonly known as: ALDACTONE Take 0.5 tablets (12.5 mg total) by mouth daily.     Disposition and follow-up:   Ryan [name] was discharged from [facility] in Stable condition.  At the hospital follow up visit please address:  1.  CHF. Ensure he has all his medications and is following up with cardiology.   2.  Labs / imaging needed at time of follow-up: BMP  3.  Pending labs/ test needing follow-up: None  Follow-up Appointments: Follow-up Information    [facility] Follow up on 10/29/2018.   Specialty: Cardiology Why: HFU 10/29/18 @ 10:00am -Parking  underneath [place] in the [place] on [address] (garage code:[id number],elevator to 1st floor).  -Take all am meds and bring all med bottles Contact information: [address] [id number] [place] Follow up.   Specialty: General Practice Why: Follow up as needed Contact information: [address] [place] [address] [phone]  Bensimhon, Shaune Pascal, MD .   Specialty: Cardiology Contact information: [address] [place] [address] [place] [facility] Course by problem list:  Acute Decompensated Heart Failure. Patient is a 68 year old male with known nonischemic cardiomyopathy likely from alcohol use who presented to the emergency department with signs and symptoms of volume overload and an acute on chronic heart failure exacerbation. He had been out of his medications for a couple weeks  prior to presentation. He was initially restarted on furosemide, Entresto, and spironolactone. Heart failure was consulted. He was diuresis that over approximately 48 hours. He was then discharged on Lasix 40 mg QD, Entresto 24-26mg  BID, spironolactone 12.5 mg QD, and digoxin 0.125mg  QD. He was instructed to hold his beta blocker and ivaberdine till he was seen in the heart failure clinic. He was discharged with a 30 day supply of these medications and will follow-up with the [facility] for further refills.  Discharge Vitals:   BP 103/73 (BP Location: Right Arm)   Pulse 93   Temp 98.1 F (36.7 C) (Oral)   Resp 16   Ht 5\' 11"  (1.803 m)   Wt 88.7 kg   SpO2 99%   BMI 27.27 kg/m   Pertinent Labs, Studies, and Procedures:   Echocardiogram 10/16/18  1. The left ventricle has a visually estimated ejection fraction of 10-15%%. The cavity size was severely dilated. Left ventricular diastolic Doppler parameters are consistent with pseudonormalization.  2. Right atrial size was mildly dilated.  3. No evidence of mitral valve stenosis.  4. The tricuspid valve is not well visualized. Tricuspid valve regurgitation was not assessed by color flow Doppler.  5. No stenosis of the aortic valve.  6. The aorta is normal in size and structure.  7. The aortic root and ascending aorta are normal in size and structure.  8. The interatrial septum was not assessed.  Discharge Instructions: Discharge Instructions    (HEART FAILURE PATIENTS) Call MD:  Anytime you have any of the following symptoms: 1) 3 pound weight gain in 24 hours or 5 pounds in 1 week 2) shortness of breath, with or without a dry hacking cough 3) swelling in the hands, feet or stomach 4) if you have to sleep on extra pillows at night in order to breathe.   Complete by: As directed    Diet - low sodium heart healthy   Complete by: As directed    Discharge instructions   Complete by: As directed    Thank you for allowing [place] to care for you during your  stay in the hospital.   You were admitted to the hospital due to a worsening in your heart failure, causing shortness of breathe. It is very important that you continue your heart medication and avoid running out before a follow up with your physicians to avoid this happening in the future.    Please take these medications as instructed by your Heart Failure doctors.   - Lasix 40 mg daily (Take first dose tomorrow, August 6th) - Entresto 24-26 mg twice a day  - Spirolactone 12.5 mg once daily - Digoxin 0.125 mg once daily.   Please do not take Carvedilol (Coreg) or Ivabradine (Corlanor) until your follow up with the Heart Failure doctors.   Increase activity slowly   Complete by: As directed     Signed: Ina Homes, MD  IMTS PGY3 10/17/2018, 11:49 AM

## 2018-10-17 NOTE — TOC Transition Note (Addendum)
Transition of Care [facility]) - CM/SW Discharge Note   Patient Details  Name: Ryan [name] MRN: [id number] Date of Birth: [birth year]  Transition of [facility]) CM/SW Contact:  Marilu Favre, RN Phone Number: 10/17/2018, 12:43 PM   Clinical Narrative:     Pharmacy filling prescriptions through [facility] and will bring all medications to patient prior to discharge.   [facility]  Barriers to Discharge: Continued Medical Work up   Patient Goals and CMS Choice Patient states their goals for this hospitalization and ongoing recovery are:: to go home CMS Medicare.gov Compare Post Acute Care list provided to:: Patient Choice offered to / list presented to : Patient  Discharge Placement                       Discharge Plan and Services   Discharge Planning Services: CM Consult, Medication Assistance            DME Arranged: N/A                    Social Determinants of Health (SDOH) Interventions     Readmission Risk Interventions No flowsheet data found.

## 2018-10-17 NOTE — Care Management Important Message (Signed)
Important Message  Patient Details  Name: Ryan [name] MRN: [id number] Date of Birth: September 17, 1950   Medicare Important Message Given:  Yes     Shelda Altes 10/17/2018, 12:09 PM

## 2018-10-17 NOTE — Progress Notes (Addendum)
Advanced Heart Failure Rounding Note  PCP-Cardiologist: No primary care provider on file.   Subjective:     Yesterday he was diuresed with IV lasix. Overall weight 3 pounds. Creatinine trending up.   Denies SOB/CP. Echo reviewed personally LVEF 15%   Objective:   Weight Range: 88.7 kg Body mass index is 27.27 kg/m.   Vital Signs:   Temp:  [97.4 F (36.3 C)-98.4 F (36.9 C)] 97.4 F (36.3 C) (08/06 0409) Pulse Rate:  [82-99] 82 (08/06 0409) Resp:  [18-20] 20 (08/06 0409) BP: (91-117)/(73-83) 100/83 (08/06 0409) SpO2:  [92 %-100 %] 98 % (08/06 0409) Weight:  [88.7 kg] 88.7 kg (08/06 0732) Last BM Date: 10/16/18  Weight change: Filed Weights   10/15/18 1214 10/16/18 0558 10/17/18 0732  Weight: 89.8 kg 88.8 kg 88.7 kg    Intake/Output:   Intake/Output Summary (Last 24 hours) at 10/17/2018 0800 Last data filed at 10/17/2018 0700 Gross per 24 hour  Intake 1312 ml  Output 1625 ml  Net -313 ml      Physical Exam    General:  Well appearing. No resp difficulty HEENT: Normal Neck: Supple. JVP 5-6 . Carotids 2+ bilat; no bruits. No lymphadenopathy or thyromegaly appreciated. Cor: PMI nondisplaced. Regular rate & rhythm. No rubs, gallops or murmurs. Lungs: Clear Abdomen: Soft, nontender, nondistended. No hepatosplenomegaly. No bruits or masses. Good bowel sounds. Extremities: No cyanosis, clubbing, rash, edema +LE healed scars Neuro: Alert & orientedx3, cranial nerves grossly intact. moves all 4 extremities w/o difficulty. Affect pleasant   Telemetry   SR 80s personally reviewed.   EKG    n/a  Labs    CBC Recent Labs    10/15/18 1715  WBC 5.7  NEUTROABS 3.7  HGB 13.3  HCT 40.2  MCV 95.9  PLT 923   Basic Metabolic Panel Recent Labs    10/16/18 0448 10/17/18 0445  NA 139 137  K 3.4* 3.6  CL 106 101  CO2 22 24  GLUCOSE 108* 109*  BUN 12 19  CREATININE 1.15 1.41*  CALCIUM 8.9 9.1  MG 2.1 2.0   Liver Function Tests No results for  input(s): AST, ALT, ALKPHOS, BILITOT, PROT, ALBUMIN in the last 72 hours. No results for input(s): LIPASE, AMYLASE in the last 72 hours. Cardiac Enzymes No results for input(s): CKTOTAL, CKMB, CKMBINDEX, TROPONINI in the last 72 hours.  BNP: BNP (last 3 results) Recent Labs    02/02/18 0906 10/15/18 1700  BNP 122.2* 535.5*    ProBNP (last 3 results) No results for input(s): PROBNP in the last 8760 hours.   D-Dimer Recent Labs    10/15/18 1700  DDIMER 0.30   Hemoglobin A1C No results for input(s): HGBA1C in the last 72 hours. Fasting Lipid Panel No results for input(s): CHOL, HDL, LDLCALC, TRIG, CHOLHDL, LDLDIRECT in the last 72 hours. Thyroid Function Tests No results for input(s): TSH, T4TOTAL, T3FREE, THYROIDAB in the last 72 hours.  Invalid input(s): FREET3  Other results:   Imaging     No results found.   Medications:     Scheduled Medications: . digoxin  0.125 mg Oral Daily  . enoxaparin (LOVENOX) injection  40 mg Subcutaneous Q24H  . folic acid  1 mg Oral Daily  . furosemide  40 mg Oral Daily  . gabapentin  300 mg Oral TID  . multivitamin with minerals  1 tablet Oral Daily  . pantoprazole  40 mg Oral BID  . sacubitril-valsartan  1 tablet Oral BID  .  sodium chloride flush  3 mL Intravenous Q12H  . spironolactone  12.5 mg Oral Daily  . thiamine  100 mg Oral Daily     Infusions:   PRN Medications:  acetaminophen **OR** acetaminophen     Assessment/Plan  1. Acute on chronic combined systolic and diastolic CHF:  -  NICM, normal cors in 11/18 EF 15-20% echo 2/20. Etiology likely ETOH.  - Echo today reviewed personally - no change -Appear euvolemic. Hold lasix today. Tomorrow restart lasix 40 mg daily.  - Continue  Entresto and spiro  - No room for hydralazine/Imdur  - Unfortunately, not candidate for advanced therapies due to poor complaince - Has refused ICD in past  2. Chest pain - this is chronic for him. May be related to HF or  non-cardiac - multiple caths with normal cors.  - hstrop negative.  - no chest pain.   3. ETOH abuse:  - Suspect he is drinking more than he admits to. He has had several DWIs in past.  - Talked about limiting his ETOH use.  - On CIWA protocol.  4. HTN: - Well controlled on current regimen.  5. Hypokalemia Continue SPiro   We should given him 30 day supply of medications using TOC then he can take prescriptions to the [facility] in [place] he cant pick up meds until next week.   HF meds for d/c  -Lasix 40 mg daily Start 8/7 -Entresto 24-26 mg twice a day  - Spiro 12.5 mg daily - Dig 0.125 mg daily.    We will set up follow up next week in the HF clinic and plan to check BMET /dig level at that time.   Length of Stay: 2  Amy Clegg, NP  10/17/2018, 8:00 AM  Advanced Heart Failure Team Pager [phone] (M-F; 7a - 4p)  Please contact [facility] for night-coverage after hours (4p -7a ) and weekends on [email or web address]  Patient seen and examined with Darrick Grinder, NP. We discussed all aspects of the encounter. I agree with the assessment and plan as stated above.   He has diuresed well. Echo with persistent severe LV dysfunction due to NICM. Stressed need to be compliant with meds. We have provided 30 day meds from [facility] program. Will get meds at [facility] after that. Stressed need to f/u in HF Clinic. Has not been VAD candidate due to poor compliance. D/w primary team at bedside. OK for d/c today from HF persepective.   Glori Bickers, MD  10:00 AM

## 2018-10-17 NOTE — Progress Notes (Addendum)
   Subjective: Ryan [name] is a 68 year old male with a history of HTN, CKD stage III, AUD, and HFrEF who presented with chest pain and shortness of breath. He is on hospital admission day 1 for acute decompensated heart failure most likely secondary to medication non-compliance due [facility].   [name] is feeling well today, with improvement in his chest pain and shortness of breath. He is ambulating well for the length of the hallway. He has spoken with his case manager about accessing his medications through the [facility] and is ready for discharge today with resumption of home medications.    Objective:  Vital signs in last 24 hours: Vitals:   10/17/18 0409 10/17/18 0732 10/17/18 1106 10/17/18 1109  BP: 100/83  (!) 89/60 103/73  Pulse: 82  (!) 43 93  Resp: 20  16   Temp: (!) 97.4 F (36.3 C)  98.1 F (36.7 C)   TempSrc: Oral  Oral   SpO2: 98%  99%   Weight:  88.7 kg    Height:       Weight change:   Intake/Output Summary (Last 24 hours) at 10/17/2018 1140 Last data filed at 10/17/2018 0816 Gross per 24 hour  Intake 1312 ml  Output 1625 ml  Net -313 ml   Physical Exam:  General: No acute distress, sitting comfortably in bed HEENT: NCAT, conjunctivae clear CV: RRR, No m/g/r appreciated. No JVD appreciated. Extremities warm and well-perfused, no edema.  Pulm: LCTAB, normal work of breathing Neuro: A/Ox3, moves all four extremities w/o difficulty  Repeat Echo: (10/16/18) reveals LVEF 15%, reviewed by Dr. Haroldine Laws.   BMP Latest Ref Rng & Units 10/17/2018 10/16/2018 10/15/2018  Glucose 70 - 99 mg/dL 109(H) 108(H) 85  BUN 8 - 23 mg/dL _0 Creatinine 0.61 - 1.24 mg/dL 1.41(H) 1.15 1.24  Sodium 135 - 145 mmol/L 137 139 136  Potassium 3.5 - 5.1 mmol/L 3.6 3.4(L) 3.5  Chloride 98 - 111 mmol/L 101 106 104  CO2 22 - 32 mmol/L 24 22 20(L)  Calcium 8.9 - 10.3 mg/dL 9.1 8.9 9.4     Assessment/Plan:  Principal Problem:   Acute  decompensated heart failure (HCC) Active Problems:   Essential hypertension   CKD (chronic kidney disease), stage III (HCC)   Chronic combined systolic and diastolic CHF (congestive heart failure) (HCC)   Acute on chronic congestive heart failure (HCC)  Acute on Chronic Combined Heart Failure Exacerbation: - Repeat echo 10/16/18 revealed LVEF 15% and large left ventricle - Blood cultures to evaluate for bacteremia showed no growth  CKD Stage 3: - EGFR 58 - Creatinine up to 1.41 from 1.15 yesterday after restarting Lasix and Entresto. Continue to monitor with PCP  Alcohol use disorder: - CIWA protocol  Hypertension:  - currently well-controlled (last recorded 103/73) with Lasix 40 mg and Entresto  F: restricted E: replete as necessary N: heart healthy diet  DVT prophylaxis: Lovenox scheduled, not given 8/5 (patient ambulating well)  Dispo: Discharge today on home digoxin 0.125 mg, Lasix 40 mg, Entresto 24-26, spironolactone 12.43m    LOS: 2 days   HKendell Bane Medical Student 10/17/2018, 11:40 AM

## 2018-10-20 LAB — CULTURE, BLOOD (ROUTINE X 2)
Culture: NO GROWTH
Culture: NO GROWTH

## 2018-10-29 ENCOUNTER — Encounter (HOSPITAL_COMMUNITY): Payer: Medicare Other

## 2018-10-30 ENCOUNTER — Ambulatory Visit (HOSPITAL_COMMUNITY)
Admission: RE | Admit: 2018-10-30 | Discharge: 2018-10-30 | Disposition: A | Discharge status: Home / Self Care | Payer: Medicare Other | Source: Ambulatory Visit | Attending: Internal Medicine | Admitting: Internal Medicine

## 2018-10-30 ENCOUNTER — Encounter (HOSPITAL_COMMUNITY): Payer: Self-pay

## 2018-10-30 ENCOUNTER — Other Ambulatory Visit: Payer: Self-pay

## 2018-10-30 VITALS — BP 120/66 | HR 82 | Wt 197.6 lb

## 2018-10-30 DIAGNOSIS — Z79899 Other long term (current) drug therapy: Secondary | ICD-10-CM | POA: Diagnosis not present

## 2018-10-30 DIAGNOSIS — I10 Essential (primary) hypertension: Secondary | ICD-10-CM

## 2018-10-30 DIAGNOSIS — Z8249 Family history of ischemic heart disease and other diseases of the circulatory system: Secondary | ICD-10-CM | POA: Insufficient documentation

## 2018-10-30 DIAGNOSIS — F17210 Nicotine dependence, cigarettes, uncomplicated: Secondary | ICD-10-CM | POA: Diagnosis not present

## 2018-10-30 DIAGNOSIS — R0602 Shortness of breath: Secondary | ICD-10-CM | POA: Diagnosis present

## 2018-10-30 DIAGNOSIS — G8929 Other chronic pain: Secondary | ICD-10-CM | POA: Insufficient documentation

## 2018-10-30 DIAGNOSIS — I5022 Chronic systolic (congestive) heart failure: Secondary | ICD-10-CM | POA: Diagnosis not present

## 2018-10-30 DIAGNOSIS — K219 Gastro-esophageal reflux disease without esophagitis: Secondary | ICD-10-CM | POA: Diagnosis not present

## 2018-10-30 DIAGNOSIS — N183 Chronic kidney disease, stage 3 unspecified: Secondary | ICD-10-CM

## 2018-10-30 DIAGNOSIS — F17200 Nicotine dependence, unspecified, uncomplicated: Secondary | ICD-10-CM

## 2018-10-30 DIAGNOSIS — I428 Other cardiomyopathies: Secondary | ICD-10-CM | POA: Diagnosis not present

## 2018-10-30 DIAGNOSIS — I130 Hypertensive heart and chronic kidney disease with heart failure and stage 1 through stage 4 chronic kidney disease, or unspecified chronic kidney disease: Secondary | ICD-10-CM | POA: Insufficient documentation

## 2018-10-30 DIAGNOSIS — M1990 Unspecified osteoarthritis, unspecified site: Secondary | ICD-10-CM | POA: Diagnosis not present

## 2018-10-30 DIAGNOSIS — R079 Chest pain, unspecified: Secondary | ICD-10-CM | POA: Insufficient documentation

## 2018-10-30 DIAGNOSIS — I272 Pulmonary hypertension, unspecified: Secondary | ICD-10-CM | POA: Insufficient documentation

## 2018-10-30 DIAGNOSIS — H409 Unspecified glaucoma: Secondary | ICD-10-CM | POA: Insufficient documentation

## 2018-10-30 DIAGNOSIS — I5042 Chronic combined systolic (congestive) and diastolic (congestive) heart failure: Secondary | ICD-10-CM | POA: Diagnosis not present

## 2018-10-30 DIAGNOSIS — M545 Low back pain: Secondary | ICD-10-CM | POA: Insufficient documentation

## 2018-10-30 DIAGNOSIS — F1010 Alcohol abuse, uncomplicated: Secondary | ICD-10-CM | POA: Diagnosis not present

## 2018-10-30 DIAGNOSIS — Z7982 Long term (current) use of aspirin: Secondary | ICD-10-CM | POA: Diagnosis not present

## 2018-10-30 LAB — CBC
HCT: 45.6 % (ref 39.0–52.0)
Hemoglobin: 14.8 g/dL (ref 13.0–17.0)
MCH: 31.5 pg (ref 26.0–34.0)
MCHC: 32.5 g/dL (ref 30.0–36.0)
MCV: 97 fL (ref 80.0–100.0)
Platelets: 287 10*3/uL (ref 150–400)
RBC: 4.7 MIL/uL (ref 4.22–5.81)
RDW: 16.6 % — ABNORMAL HIGH (ref 11.5–15.5)
WBC: 7.8 10*3/uL (ref 4.0–10.5)
nRBC: 0 % (ref 0.0–0.2)

## 2018-10-30 LAB — BASIC METABOLIC PANEL
Anion gap: 11 (ref 5–15)
BUN: 24 mg/dL — ABNORMAL HIGH (ref 8–23)
CO2: 22 mmol/L (ref 22–32)
Calcium: 9.9 mg/dL (ref 8.9–10.3)
Chloride: 103 mmol/L (ref 98–111)
Creatinine, Ser: 1.39 mg/dL — ABNORMAL HIGH (ref 0.61–1.24)
GFR calc Af Amer: 60 mL/min — ABNORMAL LOW (ref >60)
GFR calc non Af Amer: 51 mL/min — ABNORMAL LOW (ref >60)
Glucose, Bld: 107 mg/dL — ABNORMAL HIGH (ref 70–99)
Potassium: 4.7 mmol/L (ref 3.5–5.1)
Sodium: 136 mmol/L (ref 135–145)

## 2018-10-30 LAB — BRAIN NATRIURETIC PEPTIDE: B Natriuretic Peptide: 216.4 pg/mL — ABNORMAL HIGH (ref 0.0–100.0)

## 2018-10-30 MED ORDER — PANTOPRAZOLE SODIUM 40 MG PO TBEC: 40.0000 mg | DELAYED_RELEASE_TABLET | Freq: Every day | ORAL | 0 refills | Status: DC

## 2018-10-30 MED ORDER — CARVEDILOL 3.125 MG PO TABS: 3.1250 mg | ORAL_TABLET | Freq: Two times a day (BID) | ORAL | 0 refills | Status: DC

## 2018-10-30 MED ORDER — SACUBITRIL-VALSARTAN 24-26 MG PO TABS: 1.0000 | ORAL_TABLET | Freq: Two times a day (BID) | ORAL | 0 refills | Status: DC
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
## 2018-10-30 NOTE — Patient Instructions (Addendum)
Lab work done today. We will notify you of any abnormal lab work. No news is good news.  INCREASE Entresto to 24/26mg  to 1 tab two times daily.  INCREASE Carvedilol 3.125mg  to 1 tab two times daily.  REFILLED Protonix 40mg  daily  You have been referred to Cardiac Electrophysiology. They will call you in order to set up an appointment.  Please follow up with the [facility] in 3 weeks.  At the [facility], you and your health needs are our priority. As part of our continuing mission to provide you with exceptional heart care, we have created designated Provider Care Teams. These Care Teams include your primary Cardiologist (physician) and Advanced Practice Providers (APPs- Physician Assistants and Nurse Practitioners) who all work together to provide you with the care you need, when you need it.   You may see any of the following providers on your designated Care Team at your next follow up: Marland Kitchen Dr Glori Bickers . Dr Loralie Champagne . Darrick Grinder, NP   Please be sure to bring in all your medications bottles to every appointment.

## 2018-11-20 ENCOUNTER — Encounter (HOSPITAL_COMMUNITY): Payer: No Typology Code available for payment source

## 2018-12-10 ENCOUNTER — Institutional Professional Consult (permissible substitution): Payer: No Typology Code available for payment source | Admitting: Internal Medicine

## 2019-01-20 ENCOUNTER — Telehealth (HOSPITAL_COMMUNITY): Payer: Self-pay

## 2019-01-20 NOTE — Telephone Encounter (Signed)
Received a Request for medical Records form The Department of [facility] [place], [place] ) @ [phone].

## 2019-01-21 ENCOUNTER — Institutional Professional Consult (permissible substitution): Payer: No Typology Code available for payment source | Admitting: Internal Medicine

## 2019-01-30 ENCOUNTER — Encounter: Payer: Self-pay | Admitting: Internal Medicine

## 2019-03-03 DIAGNOSIS — R079 Chest pain, unspecified: Secondary | ICD-10-CM | POA: Diagnosis not present

## 2019-03-04 ENCOUNTER — Other Ambulatory Visit: Payer: Self-pay

## 2019-03-04 ENCOUNTER — Inpatient Hospital Stay (HOSPITAL_COMMUNITY)
Admission: EM | Admit: 2019-03-04 | Discharge: 2019-03-07 | DRG: 439 | Disposition: A | Discharge status: Home / Self Care | Payer: Medicare Other | Attending: Family Medicine | Admitting: Family Medicine

## 2019-03-04 ENCOUNTER — Emergency Department (HOSPITAL_COMMUNITY): Payer: Medicare Other

## 2019-03-04 ENCOUNTER — Encounter (HOSPITAL_COMMUNITY): Payer: Self-pay | Admitting: Emergency Medicine

## 2019-03-04 DIAGNOSIS — Z8719 Personal history of other diseases of the digestive system: Secondary | ICD-10-CM

## 2019-03-04 DIAGNOSIS — I428 Other cardiomyopathies: Secondary | ICD-10-CM | POA: Diagnosis present

## 2019-03-04 DIAGNOSIS — I10 Essential (primary) hypertension: Secondary | ICD-10-CM

## 2019-03-04 DIAGNOSIS — K219 Gastro-esophageal reflux disease without esophagitis: Secondary | ICD-10-CM | POA: Diagnosis present

## 2019-03-04 DIAGNOSIS — I5042 Chronic combined systolic (congestive) and diastolic (congestive) heart failure: Secondary | ICD-10-CM | POA: Diagnosis present

## 2019-03-04 DIAGNOSIS — Z79899 Other long term (current) drug therapy: Secondary | ICD-10-CM

## 2019-03-04 DIAGNOSIS — F1010 Alcohol abuse, uncomplicated: Secondary | ICD-10-CM | POA: Diagnosis not present

## 2019-03-04 DIAGNOSIS — I426 Alcoholic cardiomyopathy: Secondary | ICD-10-CM | POA: Diagnosis present

## 2019-03-04 DIAGNOSIS — K852 Alcohol induced acute pancreatitis without necrosis or infection: Secondary | ICD-10-CM | POA: Diagnosis not present

## 2019-03-04 DIAGNOSIS — I130 Hypertensive heart and chronic kidney disease with heart failure and stage 1 through stage 4 chronic kidney disease, or unspecified chronic kidney disease: Secondary | ICD-10-CM | POA: Diagnosis present

## 2019-03-04 DIAGNOSIS — Z7982 Long term (current) use of aspirin: Secondary | ICD-10-CM

## 2019-03-04 DIAGNOSIS — E876 Hypokalemia: Secondary | ICD-10-CM | POA: Diagnosis present

## 2019-03-04 DIAGNOSIS — K8500 Idiopathic acute pancreatitis without necrosis or infection: Secondary | ICD-10-CM

## 2019-03-04 DIAGNOSIS — F17210 Nicotine dependence, cigarettes, uncomplicated: Secondary | ICD-10-CM | POA: Diagnosis present

## 2019-03-04 DIAGNOSIS — N183 Chronic kidney disease, stage 3 unspecified: Secondary | ICD-10-CM | POA: Diagnosis present

## 2019-03-04 DIAGNOSIS — F1020 Alcohol dependence, uncomplicated: Secondary | ICD-10-CM | POA: Diagnosis present

## 2019-03-04 DIAGNOSIS — Z20828 Contact with and (suspected) exposure to other viral communicable diseases: Secondary | ICD-10-CM | POA: Diagnosis present

## 2019-03-04 DIAGNOSIS — R079 Chest pain, unspecified: Secondary | ICD-10-CM | POA: Diagnosis not present

## 2019-03-04 LAB — BASIC METABOLIC PANEL
Anion gap: 10 (ref 5–15)
BUN: 8 mg/dL (ref 8–23)
CO2: 24 mmol/L (ref 22–32)
Calcium: 8.9 mg/dL (ref 8.9–10.3)
Chloride: 101 mmol/L (ref 98–111)
Creatinine, Ser: 1.17 mg/dL (ref 0.61–1.24)
GFR calc Af Amer: >60 mL/min (ref >60)
GFR calc non Af Amer: >60 mL/min (ref >60)
Glucose, Bld: 145 mg/dL — ABNORMAL HIGH (ref 70–99)
Potassium: 3.7 mmol/L (ref 3.5–5.1)
Sodium: 135 mmol/L (ref 135–145)

## 2019-03-04 LAB — CBC
HCT: 43.3 % (ref 39.0–52.0)
Hemoglobin: 14.3 g/dL (ref 13.0–17.0)
MCH: 30 pg (ref 26.0–34.0)
MCHC: 33 g/dL (ref 30.0–36.0)
MCV: 91 fL (ref 80.0–100.0)
Platelets: 211 10*3/uL (ref 150–400)
RBC: 4.76 MIL/uL (ref 4.22–5.81)
RDW: 17.1 % — ABNORMAL HIGH (ref 11.5–15.5)
WBC: 18.6 10*3/uL — ABNORMAL HIGH (ref 4.0–10.5)
nRBC: 0 % (ref 0.0–0.2)

## 2019-03-04 LAB — HIV ANTIBODY (ROUTINE TESTING W REFLEX): HIV Screen 4th Generation wRfx: NONREACTIVE

## 2019-03-04 LAB — LIPASE, BLOOD: Lipase: 103 U/L — ABNORMAL HIGH (ref 11–51)

## 2019-03-04 LAB — MAGNESIUM: Magnesium: 1.9 mg/dL (ref 1.7–2.4)

## 2019-03-04 LAB — HEPATIC FUNCTION PANEL
ALT: 19 U/L (ref 0–44)
AST: 28 U/L (ref 15–41)
Albumin: 3.5 g/dL (ref 3.5–5.0)
Alkaline Phosphatase: 83 U/L (ref 38–126)
Bilirubin, Direct: 0.2 mg/dL (ref 0.0–0.2)
Indirect Bilirubin: 0.3 mg/dL (ref 0.3–0.9)
Total Bilirubin: 0.5 mg/dL (ref 0.3–1.2)
Total Protein: 7.1 g/dL (ref 6.5–8.1)

## 2019-03-04 LAB — PHOSPHORUS: Phosphorus: 2.2 mg/dL — ABNORMAL LOW (ref 2.5–4.6)

## 2019-03-04 LAB — TROPONIN I (HIGH SENSITIVITY)
Troponin I (High Sensitivity): 16 ng/L (ref <18)
Troponin I (High Sensitivity): 14 ng/L (ref <18)

## 2019-03-04 LAB — SARS CORONAVIRUS 2 (TAT 6-24 HRS): SARS Coronavirus 2: NEGATIVE

## 2019-03-04 MED ORDER — SODIUM CHLORIDE 0.9% FLUSH: 3.0000 mL | Freq: Once | INTRAVENOUS | Status: DC

## 2019-03-04 MED ORDER — ACETAMINOPHEN 325 MG PO TABS
650.0000 mg | ORAL_TABLET | Freq: Four times a day (QID) | ORAL | Status: DC | PRN
  Administered 2019-03-05: 650 mg via ORAL
  Filled 2019-03-04: qty 2

## 2019-03-04 MED ORDER — LORATADINE 10 MG PO TABS
10.0000 mg | ORAL_TABLET | Freq: Every day | ORAL | Status: DC
  Administered 2019-03-04 – 2019-03-06 (×3): 10 mg via ORAL
  Filled 2019-03-04 (×3): qty 1

## 2019-03-04 MED ORDER — HYDROMORPHONE HCL 1 MG/ML IJ SOLN
1.0000 mg | INTRAMUSCULAR | Status: DC | PRN
  Administered 2019-03-04 – 2019-03-07 (×14): 1 mg via INTRAVENOUS
  Filled 2019-03-04 (×14): qty 1

## 2019-03-04 MED ORDER — PSYLLIUM 95 % PO PACK
1.0000 | PACK | Freq: Every day | ORAL | Status: DC
  Administered 2019-03-06: 1 via ORAL
  Filled 2019-03-04 (×3): qty 1

## 2019-03-04 MED ORDER — LORAZEPAM 2 MG/ML IJ SOLN: 1.0000 mg | INTRAMUSCULAR | Status: DC | PRN

## 2019-03-04 MED ORDER — IVABRADINE HCL 5 MG PO TABS
5.0000 mg | ORAL_TABLET | Freq: Two times a day (BID) | ORAL | Status: DC
  Administered 2019-03-05 – 2019-03-07 (×5): 5 mg via ORAL
  Filled 2019-03-04 (×6): qty 1

## 2019-03-04 MED ORDER — ADULT MULTIVITAMIN W/MINERALS CH
1.0000 | ORAL_TABLET | Freq: Every day | ORAL | Status: DC
  Administered 2019-03-04 – 2019-03-07 (×4): 1 via ORAL
  Filled 2019-03-04 (×4): qty 1

## 2019-03-04 MED ORDER — ENOXAPARIN SODIUM 40 MG/0.4ML ~~LOC~~ SOLN
40.0000 mg | SUBCUTANEOUS | Status: DC
  Filled 2019-03-04 (×2): qty 0.4

## 2019-03-04 MED ORDER — THIAMINE HCL 100 MG/ML IJ SOLN: 100.0000 mg | Freq: Every day | INTRAMUSCULAR | Status: DC

## 2019-03-04 MED ORDER — IOHEXOL 350 MG/ML SOLN
100.0000 mL | Freq: Once | INTRAVENOUS | Status: AC | PRN
  Administered 2019-03-04: 100 mL via INTRAVENOUS

## 2019-03-04 MED ORDER — SODIUM CHLORIDE 0.9 % IV SOLN: INTRAVENOUS | Status: DC

## 2019-03-04 MED ORDER — FAMOTIDINE IN NACL 20-0.9 MG/50ML-% IV SOLN
20.0000 mg | Freq: Two times a day (BID) | INTRAVENOUS | Status: DC
  Administered 2019-03-04 – 2019-03-06 (×4): 20 mg via INTRAVENOUS
  Filled 2019-03-04 (×4): qty 50

## 2019-03-04 MED ORDER — ALBUTEROL SULFATE (2.5 MG/3ML) 0.083% IN NEBU: 2.5000 mg | INHALATION_SOLUTION | Freq: Four times a day (QID) | RESPIRATORY_TRACT | Status: DC | PRN

## 2019-03-04 MED ORDER — ONDANSETRON HCL 4 MG PO TABS: 4.0000 mg | ORAL_TABLET | Freq: Four times a day (QID) | ORAL | Status: DC | PRN

## 2019-03-04 MED ORDER — GABAPENTIN 300 MG PO CAPS
300.0000 mg | ORAL_CAPSULE | Freq: Two times a day (BID) | ORAL | Status: DC | PRN
  Administered 2019-03-05: 300 mg via ORAL
  Filled 2019-03-04: qty 1

## 2019-03-04 MED ORDER — FENTANYL CITRATE (PF) 100 MCG/2ML IJ SOLN
50.0000 ug | Freq: Once | INTRAMUSCULAR | Status: AC
  Administered 2019-03-04: 50 ug via INTRAVENOUS
  Filled 2019-03-04: qty 2

## 2019-03-04 MED ORDER — THIAMINE HCL 100 MG PO TABS
100.0000 mg | ORAL_TABLET | Freq: Every day | ORAL | Status: DC
  Administered 2019-03-04 – 2019-03-07 (×4): 100 mg via ORAL
  Filled 2019-03-04 (×4): qty 1

## 2019-03-04 MED ORDER — SACUBITRIL-VALSARTAN 24-26 MG PO TABS
1.0000 | ORAL_TABLET | Freq: Two times a day (BID) | ORAL | Status: DC
  Administered 2019-03-05 – 2019-03-07 (×6): 1 via ORAL
  Filled 2019-03-04 (×6): qty 1

## 2019-03-04 MED ORDER — HYDROMORPHONE HCL 1 MG/ML IJ SOLN
1.0000 mg | INTRAMUSCULAR | Status: DC | PRN
  Administered 2019-03-04: 1 mg via INTRAVENOUS
  Filled 2019-03-04: qty 1

## 2019-03-04 MED ORDER — FOLIC ACID 1 MG PO TABS
1.0000 mg | ORAL_TABLET | Freq: Every day | ORAL | Status: DC
  Administered 2019-03-04 – 2019-03-07 (×4): 1 mg via ORAL
  Filled 2019-03-04 (×4): qty 1

## 2019-03-04 MED ORDER — ACETAMINOPHEN 650 MG RE SUPP: 650.0000 mg | Freq: Four times a day (QID) | RECTAL | Status: DC | PRN

## 2019-03-04 MED ORDER — ASPIRIN EC 81 MG PO TBEC: 81.0000 mg | DELAYED_RELEASE_TABLET | ORAL | Status: DC | PRN

## 2019-03-04 MED ORDER — LORAZEPAM 1 MG PO TABS: 1.0000 mg | ORAL_TABLET | ORAL | Status: DC | PRN

## 2019-03-04 MED ORDER — PANTOPRAZOLE SODIUM 40 MG PO TBEC
40.0000 mg | DELAYED_RELEASE_TABLET | Freq: Every day | ORAL | Status: DC
  Administered 2019-03-05 – 2019-03-07 (×3): 40 mg via ORAL
  Filled 2019-03-04 (×4): qty 1

## 2019-03-04 MED ORDER — ONDANSETRON HCL 4 MG/2ML IJ SOLN
4.0000 mg | Freq: Four times a day (QID) | INTRAMUSCULAR | Status: DC | PRN
  Administered 2019-03-06 (×2): 4 mg via INTRAVENOUS
  Filled 2019-03-04 (×2): qty 2

## 2019-03-04 MED ORDER — SODIUM CHLORIDE 0.9 % IV SOLN: Freq: Once | INTRAVENOUS | Status: AC

## 2019-03-04 MED ORDER — ALBUTEROL SULFATE HFA 108 (90 BASE) MCG/ACT IN AERS: 2.0000 | INHALATION_SPRAY | Freq: Four times a day (QID) | RESPIRATORY_TRACT | Status: DC | PRN
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
## 2019-03-04 NOTE — ED Triage Notes (Signed)
Pt to ER for evaluation of central chest pain described as sharp radiating to abdomen onset 2 days ago with shortness of breath and 1 episode of vomiting. Vitals stable in triage. Currently rating pain 10/10.
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
## 2019-03-04 NOTE — ED Provider Notes (Addendum)
CP and vomiting, epigastric pain. CTA pending. Sig CHF EF 10-15%. Consult cardiology, anticipated admission. Physical Exam  BP 132/82   Pulse (!) 102   Temp (!) 97.5 F (36.4 C) (Oral)   Resp 19   SpO2 100%   Physical Exam  ED Course/Procedures   Clinical Course as of Mar 03 1950  Tue Mar 04, 2019  1916 Consult: Reviewed with Dr. Marlou Porch.  We reviewed the patient's known EF estimated 10 to 15%.  He advises to treat the pancreatitis with hydration as needed, obviously trying to avoid excessive fluid administration.  Follow the patient's oxygen saturation and clinical exam for volume overload.  As well as can reconsult cardiology tomorrow if needed.   [MP]  1950 Consult: Reviewed with Dr. Alcario Drought for admission   [MP]    Clinical Course User Index [MP] Charlesetta Shanks, MD    Procedures  MDM  Patient is alert and appropriate.  He is updated on the diagnosis.  No respiratory distress at rest.  Will initiate a conservative fluid bolus and maintenance fluids.  As needed Dilaudid ordered for pain control.  Consultations have been placed for cardiology and unassigned medical admitting team.       Charlesetta Shanks, MD 03/04/19 1918    Charlesetta Shanks, MD 03/04/19 [place]    Charlesetta Shanks, MD 03/05/19 2036

## 2019-03-05 DIAGNOSIS — K219 Gastro-esophageal reflux disease without esophagitis: Secondary | ICD-10-CM | POA: Diagnosis present

## 2019-03-05 DIAGNOSIS — K852 Alcohol induced acute pancreatitis without necrosis or infection: Secondary | ICD-10-CM | POA: Diagnosis present

## 2019-03-05 DIAGNOSIS — F17210 Nicotine dependence, cigarettes, uncomplicated: Secondary | ICD-10-CM | POA: Diagnosis present

## 2019-03-05 DIAGNOSIS — Z20828 Contact with and (suspected) exposure to other viral communicable diseases: Secondary | ICD-10-CM | POA: Diagnosis present

## 2019-03-05 DIAGNOSIS — I428 Other cardiomyopathies: Secondary | ICD-10-CM | POA: Diagnosis present

## 2019-03-05 DIAGNOSIS — Z7982 Long term (current) use of aspirin: Secondary | ICD-10-CM | POA: Diagnosis not present

## 2019-03-05 DIAGNOSIS — E876 Hypokalemia: Secondary | ICD-10-CM | POA: Diagnosis present

## 2019-03-05 DIAGNOSIS — I130 Hypertensive heart and chronic kidney disease with heart failure and stage 1 through stage 4 chronic kidney disease, or unspecified chronic kidney disease: Secondary | ICD-10-CM | POA: Diagnosis present

## 2019-03-05 DIAGNOSIS — I5042 Chronic combined systolic (congestive) and diastolic (congestive) heart failure: Secondary | ICD-10-CM | POA: Diagnosis present

## 2019-03-05 DIAGNOSIS — Z79899 Other long term (current) drug therapy: Secondary | ICD-10-CM | POA: Diagnosis not present

## 2019-03-05 DIAGNOSIS — F1020 Alcohol dependence, uncomplicated: Secondary | ICD-10-CM | POA: Diagnosis present

## 2019-03-05 DIAGNOSIS — I426 Alcoholic cardiomyopathy: Secondary | ICD-10-CM | POA: Diagnosis present

## 2019-03-05 DIAGNOSIS — N183 Chronic kidney disease, stage 3 unspecified: Secondary | ICD-10-CM | POA: Diagnosis present

## 2019-03-05 LAB — COMPREHENSIVE METABOLIC PANEL
ALT: 14 U/L (ref 0–44)
AST: 18 U/L (ref 15–41)
Albumin: 3.1 g/dL — ABNORMAL LOW (ref 3.5–5.0)
Alkaline Phosphatase: 78 U/L (ref 38–126)
Anion gap: 10 (ref 5–15)
BUN: 6 mg/dL — ABNORMAL LOW (ref 8–23)
CO2: 26 mmol/L (ref 22–32)
Calcium: 8.6 mg/dL — ABNORMAL LOW (ref 8.9–10.3)
Chloride: 99 mmol/L (ref 98–111)
Creatinine, Ser: 0.95 mg/dL (ref 0.61–1.24)
GFR calc Af Amer: >60 mL/min (ref >60)
GFR calc non Af Amer: >60 mL/min (ref >60)
Glucose, Bld: 107 mg/dL — ABNORMAL HIGH (ref 70–99)
Potassium: 3.4 mmol/L — ABNORMAL LOW (ref 3.5–5.1)
Sodium: 135 mmol/L (ref 135–145)
Total Bilirubin: 0.6 mg/dL (ref 0.3–1.2)
Total Protein: 6.2 g/dL — ABNORMAL LOW (ref 6.5–8.1)

## 2019-03-05 LAB — CBC
HCT: 39.2 % (ref 39.0–52.0)
Hemoglobin: 13.1 g/dL (ref 13.0–17.0)
MCH: 30.2 pg (ref 26.0–34.0)
MCHC: 33.4 g/dL (ref 30.0–36.0)
MCV: 90.3 fL (ref 80.0–100.0)
Platelets: 165 10*3/uL (ref 150–400)
RBC: 4.34 MIL/uL (ref 4.22–5.81)
RDW: 17.4 % — ABNORMAL HIGH (ref 11.5–15.5)
WBC: 16 10*3/uL — ABNORMAL HIGH (ref 4.0–10.5)
nRBC: 0 % (ref 0.0–0.2)

## 2019-03-05 LAB — LIPID PANEL
Cholesterol: 131 mg/dL (ref 0–200)
HDL: 66 mg/dL (ref >40)
LDL Cholesterol: 57 mg/dL (ref 0–99)
Total CHOL/HDL Ratio: 2 RATIO
Triglycerides: 40 mg/dL (ref <150)
VLDL: 8 mg/dL (ref 0–40)

## 2019-03-05 MED ORDER — POTASSIUM PHOSPHATES 15 MMOLE/5ML IV SOLN
30.0000 mmol | Freq: Once | INTRAVENOUS | Status: AC
  Administered 2019-03-05: 30 mmol via INTRAVENOUS
  Filled 2019-03-05: qty 10

## 2019-03-05 MED ORDER — ASPIRIN EC 81 MG PO TBEC: 81.0000 mg | DELAYED_RELEASE_TABLET | ORAL | Status: DC | PRN

## 2019-03-05 NOTE — Plan of Care (Signed)
  Problem: Education: Goal: Knowledge of Pancreatitis treatment and prevention will improve Outcome: Progressing   

## 2019-03-05 NOTE — Progress Notes (Signed)
PROGRESS NOTE    Ryan [name]  [id number] DOB: 1951/01/31 DOA: 03/04/2019 PCP: [facility]   Brief Narrative:  HPI: Ryan [name] is a 68 y.o. male with medical history significant of EtOH abuse, alcoholic cardiomyopathy with EF 10-15% on echo just a couple of month ago, prior EtOH pancreatitis.  Patient presents to ED with c/o epigastric pain, CP, and vomiting.  Symptoms onset after he drank a couple of beers while watching a football game a couple of days ago.  Drinks very rarely now though used to be heavy drinker in past.  Pain is constant, radiates to LUQ and L chest.   ED Course: Acute pancreatitis on CT scan with lipase of 100.  Assessment & Plan:   Principal Problem:   Alcohol-induced acute pancreatitis without infection or necrosis Active Problems:   Essential hypertension   Hypokalemia   Alcohol abuse   Chronic combined systolic and diastolic CHF (congestive heart failure) (HCC)   Hx of chronic pancreatitis   Hypophosphatemia   Acute alcoholic pancreatitis: Diagnosis based on abdominal pain and CT findings.  Lipase within normal range.  Patient's pain is 7 out of 10, slightly better than yesterday.  No nausea.  We will start him on clears and advance to full liquid diet.  Continue pain management.  Gallbladder normal with no gallstones.  Will check lipid panel to see if he has hypertriglyceridemia.  Hypokalemia/hypophosphatemia: Replete both.  Repeat labs in the morning.  Alcoholic cardiomyopathy with chronic systolic congestive heart failure: Stable.  Continue Entresto.  Resume aspirin.  GERD: Continue PPI  Alcohol abuse/dependence: Continue CIWA protocol.  No signs of withdrawal as of yet.  DVT prophylaxis: Lovenox Code Status: Full code Family Communication:  None present at bedside.  Plan of care discussed with patient in length and he verbalized understanding and agreed with it. Disposition Plan: Potential discharge in next 1 to 2  days  Estimated body mass index is 27.56 kg/m as calculated from the following:   Height as of 10/15/18: 5\' 11"  (1.803 m).   Weight as of 10/30/18: 89.6 kg.      Nutritional status:               Consultants:   None  Procedures:   None  Antimicrobials:   None   Subjective: Seen and examined.  Still with abdominal pain but better than yesterday.  Currently 7 out of 10.  No nausea.  No other complaint.  Objective: Vitals:   03/04/19 2130 03/04/19 2145 03/04/19 2243 03/05/19 0429  BP: 129/84 (!) 156/88 (!) 137/92 125/89  Pulse: (!) 106 95 (!) 109 (!) 110  Resp: (!) 26 (!) 27 20 19   Temp:   98.7 F (37.1 C) 98.6 F (37 C)  TempSrc:   Oral Oral  SpO2: 99% 97% 100% 100%    Intake/Output Summary (Last 24 hours) at 03/05/2019 0900 Last data filed at 03/05/2019 0429 Gross per 24 hour  Intake 690 ml  Output 550 ml  Net 140 ml   There were no vitals filed for this visit.  Examination:  General exam: Appears calm and comfortable  Respiratory system: Clear to auscultation. Respiratory effort normal. Cardiovascular system: S1 & S2 heard, RRR. No JVD, murmurs, rubs, gallops or clicks. No pedal edema. Gastrointestinal system: Abdomen is nondistended, soft and epigastric and right upper quadrant mild to moderate tenderness. No organomegaly or masses felt. Normal bowel sounds heard. Central nervous system: Alert and oriented. No focal neurological deficits. Extremities: Symmetric  5 x 5 power. Skin: No rashes, lesions or ulcers Psychiatry: Judgement and insight appear normal. Mood & affect appropriate.    Data Reviewed: I have personally reviewed following labs and imaging studies  CBC: Recent Labs  Lab 03/04/19 1345 03/05/19 0215  WBC 18.6* 16.0*  HGB 14.3 13.1  HCT 43.3 39.2  MCV 91.0 90.3  PLT 211 [id number]   Basic Metabolic Panel: Recent Labs  Lab 03/04/19 1345 03/04/19 2048 03/05/19 0215  NA 135  --  135  K 3.7  --  3.4*  CL 101  --  99  CO2 24   --  26  GLUCOSE 145*  --  107*  BUN 8  --  6*  CREATININE 1.17  --  0.95  CALCIUM 8.9  --  8.6*  MG  --  1.9  --   PHOS  --  2.2*  --    GFR: CrCl cannot be calculated (Unknown ideal weight.). Liver Function Tests: Recent Labs  Lab 03/04/19 1426 03/05/19 0215  AST 28 18  ALT 19 14  ALKPHOS 83 78  BILITOT 0.5 0.6  PROT 7.1 6.2*  ALBUMIN 3.5 3.1*   Recent Labs  Lab 03/04/19 1426  LIPASE 103*   No results for input(s): AMMONIA in the last 168 hours. Coagulation Profile: No results for input(s): INR, PROTIME in the last 168 hours. Cardiac Enzymes: No results for input(s): CKTOTAL, CKMB, CKMBINDEX, TROPONINI in the last 168 hours. BNP (last 3 results) No results for input(s): PROBNP in the last 8760 hours. HbA1C: No results for input(s): HGBA1C in the last 72 hours. CBG: No results for input(s): GLUCAP in the last 168 hours. Lipid Profile: No results for input(s): CHOL, HDL, LDLCALC, TRIG, CHOLHDL, LDLDIRECT in the last 72 hours. Thyroid Function Tests: No results for input(s): TSH, T4TOTAL, FREET4, T3FREE, THYROIDAB in the last 72 hours. Anemia Panel: No results for input(s): VITAMINB12, FOLATE, FERRITIN, TIBC, IRON, RETICCTPCT in the last 72 hours. Sepsis Labs: No results for input(s): PROCALCITON, LATICACIDVEN in the last 168 hours.  Recent Results (from the past 240 hour(s))  SARS CORONAVIRUS 2 (TAT 6-24 HRS) Nasopharyngeal Nasopharyngeal Swab     Status: None   Collection Time: 03/04/19  5:44 PM   Specimen: Nasopharyngeal Swab  Result Value Ref Range Status   SARS Coronavirus 2 NEGATIVE NEGATIVE Final    Comment: (NOTE) SARS-CoV-2 target nucleic acids are NOT DETECTED. The SARS-CoV-2 RNA is generally detectable in upper and lower respiratory specimens during the acute phase of infection. Negative results do not preclude SARS-CoV-2 infection, do not rule out co-infections with other pathogens, and should not be used as the sole basis for treatment or other  patient management decisions. Negative results must be combined with clinical observations, patient history, and epidemiological information. The expected result is Negative. Fact Sheet for Patients: SugarRoll.be Fact Sheet for Healthcare Providers: https://www.woods-mathews.com/ This test is not yet approved or cleared by the [place] FDA and  has been authorized for detection and/or diagnosis of SARS-CoV-2 by FDA under an Emergency Use Authorization (EUA). This EUA will remain  in effect (meaning this test can be used) for the duration of the COVID-19 declaration under Section 56 4(b)(1) of the Act, 21 U.S.C. section 360bbb-3(b)(1), unless the authorization is terminated or revoked sooner. Performed at [facility], [place] [address] [place], [place] [address]       Radiology Studies: DG Chest 2 View  Result Date: 03/04/2019 CLINICAL DATA:  Chest and abdominal pain EXAM:  CHEST - 2 VIEW COMPARISON:  03/03/2019 FINDINGS: Left basilar linear scarring or atelectasis. No pleural effusion or pneumothorax. Stable cardiomediastinal contours. No acute osseous abnormality. IMPRESSION: No acute process in the chest. Electronically Signed   By: Macy Mis M.D.   On: 03/04/2019 14:19   CT Angio Chest/Abd/Pel for Dissection W and/or W/WO  Result Date: 03/04/2019 CLINICAL DATA:  Central chest pain radiating to back and abdomen. Shortness of breath. EXAM: CT ANGIOGRAPHY CHEST, ABDOMEN AND PELVIS TECHNIQUE: Multidetector CT imaging through the chest, abdomen and pelvis was performed using the standard protocol during bolus administration of intravenous contrast. Multiplanar reconstructed images and MIPs were obtained and reviewed to evaluate the vascular anatomy. CONTRAST:  155mL OMNIPAQUE IOHEXOL 350 MG/ML SOLN COMPARISON:  None. FINDINGS: CTA CHEST FINDINGS Cardiovascular: Heart is normal size. Aorta is normal caliber. Scattered aortic  calcifications. No aneurysm or dissection. No filling defects in the pulmonary arteries to suggest pulmonary emboli. Mediastinum/Nodes: No mediastinal, hilar, or axillary adenopathy. Trachea and esophagus are unremarkable. Thyroid unremarkable. Lungs/Pleura: No confluent opacities or effusions. Linear areas of atelectasis or scarring in the lung bases. Musculoskeletal: Chest wall soft tissues are unremarkable. No acute bony abnormality. Review of the MIP images confirms the above findings. CTA ABDOMEN AND PELVIS FINDINGS VASCULAR Aorta: Normal caliber aorta without aneurysm, dissection, vasculitis or significant stenosis. Aortic atherosclerosis. Celiac: Patent without evidence of aneurysm, dissection, vasculitis or significant stenosis. SMA: Patent without evidence of aneurysm, dissection, vasculitis or significant stenosis. Renals: Both renal arteries are patent without evidence of aneurysm, dissection, vasculitis, fibromuscular dysplasia or significant stenosis. IMA: Patent without evidence of aneurysm, dissection, vasculitis or significant stenosis. Inflow: Patent without evidence of aneurysm, dissection, vasculitis or significant stenosis. Veins: No obvious venous abnormality within the limitations of this arterial phase study. Review of the MIP images confirms the above findings. NON-VASCULAR Hepatobiliary: Suspect diffuse fatty infiltration. No focal hepatic abnormality. Gallbladder unremarkable. Pancreas: Inflammation/stranding around the pancreas compatible with acute pancreatitis. No focal pancreatic abnormality. Spleen: No focal abnormality.  Normal size. Adrenals/Urinary Tract: Small cysts in the kidneys. 1.6 cm calcification in the lower pole of the right kidney. No hydronephrosis. Adrenal glands and urinary bladder unremarkable. Stomach/Bowel: And left scattered colonic diverticulosis in both colon. The right no evidence of diverticulitis. Normal appendix. Stomach and small bowel decompressed,  unremarkable. Lymphatic: No adenopathy Reproductive: Prostate calcifications. Other: Small amount of free fluid in the cul-de-sac.  No free air. Musculoskeletal: Posttraumatic deformity in the proximal left femur. Old bilateral superior and inferior pubic rami fractures. No acute bone abnormality. Review of the MIP images confirms the above findings. IMPRESSION: No evidence of aortic aneurysm or dissection. No evidence of pulmonary embolus. No acute cardiopulmonary disease. Stranding/inflammation surrounding the pancreas compatible with acute pancreatitis. Small amount of free fluid in the pelvis. Colonic diverticulosis.  No active diverticulitis. Right lower pole nephrolithiasis. Suspect fatty infiltration of the liver. Electronically Signed   By: Rolm Baptise M.D.   On: 03/04/2019 17:25    Scheduled Meds: . enoxaparin (LOVENOX) injection  40 mg Subcutaneous Q24H  . folic acid  1 mg Oral Daily  . ivabradine  5 mg Oral BID WC  . loratadine  10 mg Oral QHS  . multivitamin with minerals  1 tablet Oral Daily  . pantoprazole  40 mg Oral Daily  . psyllium  1 packet Oral Daily  . sacubitril-valsartan  1 tablet Oral BID  . sodium chloride flush  3 mL Intravenous Once  . thiamine  100 mg Oral Daily  Or  . thiamine  100 mg Intravenous Daily   Continuous Infusions: . sodium chloride 100 mL/hr at 03/05/19 0434  . famotidine (PEPCID) IV 20 mg (03/04/19 2134)  . potassium PHOSPHATE IVPB (in mmol)       LOS: 0 days   Time spent: 32 minutes   Darliss Cheney, MD [facility]  03/05/2019, 9:00 AM   To contact the attending provider between 7A-7P or the covering provider during after hours 7P-7A, please log into the web site [email or web address] and use password [email or web address].

## 2019-03-06 LAB — BASIC METABOLIC PANEL
Anion gap: 11 (ref 5–15)
BUN: 5 mg/dL — ABNORMAL LOW (ref 8–23)
CO2: 26 mmol/L (ref 22–32)
Calcium: 8.1 mg/dL — ABNORMAL LOW (ref 8.9–10.3)
Chloride: 98 mmol/L (ref 98–111)
Creatinine, Ser: 0.77 mg/dL (ref 0.61–1.24)
GFR calc Af Amer: >60 mL/min (ref >60)
GFR calc non Af Amer: >60 mL/min (ref >60)
Glucose, Bld: 116 mg/dL — ABNORMAL HIGH (ref 70–99)
Potassium: 3.2 mmol/L — ABNORMAL LOW (ref 3.5–5.1)
Sodium: 135 mmol/L (ref 135–145)

## 2019-03-06 LAB — MAGNESIUM: Magnesium: 1.8 mg/dL (ref 1.7–2.4)

## 2019-03-06 LAB — PHOSPHORUS: Phosphorus: 1.9 mg/dL — ABNORMAL LOW (ref 2.5–4.6)

## 2019-03-06 MED ORDER — POTASSIUM CHLORIDE CRYS ER 20 MEQ PO TBCR
40.0000 meq | EXTENDED_RELEASE_TABLET | Freq: Once | ORAL | Status: AC
  Administered 2019-03-06: 40 meq via ORAL
  Filled 2019-03-06: qty 2

## 2019-03-06 MED ORDER — FAMOTIDINE 20 MG PO TABS
20.0000 mg | ORAL_TABLET | Freq: Two times a day (BID) | ORAL | Status: DC
  Administered 2019-03-06 – 2019-03-07 (×2): 20 mg via ORAL
  Filled 2019-03-06 (×2): qty 1

## 2019-03-06 MED ORDER — POTASSIUM PHOSPHATES 15 MMOLE/5ML IV SOLN
30.0000 mmol | Freq: Once | INTRAVENOUS | Status: AC
  Administered 2019-03-06: 30 mmol via INTRAVENOUS
  Filled 2019-03-06: qty 10

## 2019-03-06 NOTE — Progress Notes (Signed)
PROGRESS NOTE    Ryan [name]  [id number] DOB: [birth year] DOA: 03/04/2019 PCP: [facility]   Brief Narrative:  HPI: Ryan [name] is a 68 y.o. male with medical history significant of EtOH abuse, alcoholic cardiomyopathy with EF 10-15% on echo just a couple of month ago, prior EtOH pancreatitis.  Patient presents to ED with c/o epigastric pain, CP, and vomiting.  Symptoms onset after he drank a couple of beers while watching a football game a couple of days ago.  Drinks very rarely now though used to be heavy drinker in past.  Pain is constant, radiates to LUQ and L chest.   ED Course: Acute pancreatitis on CT scan with lipase of 100.  Assessment & Plan:   Principal Problem:   Alcohol-induced acute pancreatitis without infection or necrosis Active Problems:   Essential hypertension   Hypokalemia   Alcohol abuse   Chronic combined systolic and diastolic CHF (congestive heart failure) (HCC)   Hx of chronic pancreatitis   Hypophosphatemia   Acute alcoholic pancreatitis   Acute alcoholic pancreatitis: Diagnosis based on abdominal pain and CT findings.  Lipase within normal range.  Patient is still with 7 out of 10 pain however he is tolerating full liquid diet without having any nausea.  Less abdominal tenderness compared to yesterday.  Advancing diet to soft and then regular.  Offered patient to go home however he is not comfortable going home today and would like to stay overnight.  Hypokalemia/hypophosphatemia: Both low again.  Will replete again.  Repeat labs in the morning.  Alcoholic cardiomyopathy with chronic systolic congestive heart failure: Stable.  Continue Entresto.  Resume aspirin.  GERD: Continue PPI  Alcohol abuse/dependence: Continue CIWA protocol.  No signs of withdrawal as of yet.  DVT prophylaxis: Lovenox Code Status: Full code Family Communication:  None present at bedside.  Plan of care discussed with patient in length and he  verbalized understanding and agreed with it. Disposition Plan: Discharge home tomorrow.  Estimated body mass index is 27.56 kg/m as calculated from the following:   Height as of 10/15/18: 5\' 11"  (1.803 m).   Weight as of 10/30/18: 89.6 kg.      Nutritional status:               Consultants:   None  Procedures:   None  Antimicrobials:   None   Subjective: Seen and examined.  Still with 7/10 pain.  Tolerating full liquid diet.  No other complaints such as nausea.  Objective: Vitals:   03/05/19 0429 03/05/19 1430 03/05/19 2252 03/06/19 0448  BP: 125/89 121/73 135/76 (!) 112/55  Pulse: (!) 110 93 91 91  Resp: 19 18 16 20   Temp: 98.6 F (37 C) 97.7 F (36.5 C) 98.9 F (37.2 C) 98.7 F (37.1 C)  TempSrc: Oral Oral Oral Oral  SpO2: 100% 100% 100% 99%    Intake/Output Summary (Last 24 hours) at 03/06/2019 1243 Last data filed at 03/06/2019 1210 Gross per 24 hour  Intake 2288.02 ml  Output 2625 ml  Net -336.98 ml   There were no vitals filed for this visit.  Examination:  General exam: Appears calm and comfortable  Respiratory system: Clear to auscultation. Respiratory effort normal. Cardiovascular system: S1 & S2 heard, RRR. No JVD, murmurs, rubs, gallops or clicks. No pedal edema. Gastrointestinal system: Abdomen is nondistended, soft and mild epigastric and right upper quadrant tenderness. No organomegaly or masses felt. Normal bowel sounds heard. Central nervous system: Alert and oriented.  No focal neurological deficits. Extremities: Symmetric 5 x 5 power. Skin: No rashes, lesions or ulcers.  Psychiatry: Judgement and insight appear normal. Mood & affect appropriate.    Data Reviewed: I have personally reviewed following labs and imaging studies  CBC: Recent Labs  Lab 03/04/19 1345 03/05/19 0215  WBC 18.6* 16.0*  HGB 14.3 13.1  HCT 43.3 39.2  MCV 91.0 90.3  PLT 211 [id number]   Basic Metabolic Panel: Recent Labs  Lab 03/04/19 1345  03/04/19 2048 03/05/19 0215 03/06/19 0424  NA 135  --  135 135  K 3.7  --  3.4* 3.2*  CL 101  --  99 98  CO2 24  --  26 26  GLUCOSE 145*  --  107* 116*  BUN 8  --  6* 5*  CREATININE 1.17  --  0.95 0.77  CALCIUM 8.9  --  8.6* 8.1*  MG  --  1.9  --  1.8  PHOS  --  2.2*  --  1.9*   GFR: CrCl cannot be calculated (Unknown ideal weight.). Liver Function Tests: Recent Labs  Lab 03/04/19 1426 03/05/19 0215  AST 28 18  ALT 19 14  ALKPHOS 83 78  BILITOT 0.5 0.6  PROT 7.1 6.2*  ALBUMIN 3.5 3.1*   Recent Labs  Lab 03/04/19 1426  LIPASE 103*   No results for input(s): AMMONIA in the last 168 hours. Coagulation Profile: No results for input(s): INR, PROTIME in the last 168 hours. Cardiac Enzymes: No results for input(s): CKTOTAL, CKMB, CKMBINDEX, TROPONINI in the last 168 hours. BNP (last 3 results) No results for input(s): PROBNP in the last 8760 hours. HbA1C: No results for input(s): HGBA1C in the last 72 hours. CBG: No results for input(s): GLUCAP in the last 168 hours. Lipid Profile: Recent Labs    03/05/19 1036  CHOL 131  HDL 66  LDLCALC 57  TRIG 40  CHOLHDL 2.0   Thyroid Function Tests: No results for input(s): TSH, T4TOTAL, FREET4, T3FREE, THYROIDAB in the last 72 hours. Anemia Panel: No results for input(s): VITAMINB12, FOLATE, FERRITIN, TIBC, IRON, RETICCTPCT in the last 72 hours. Sepsis Labs: No results for input(s): PROCALCITON, LATICACIDVEN in the last 168 hours.  Recent Results (from the past 240 hour(s))  SARS CORONAVIRUS 2 (TAT 6-24 HRS) Nasopharyngeal Nasopharyngeal Swab     Status: None   Collection Time: 03/04/19  5:44 PM   Specimen: Nasopharyngeal Swab  Result Value Ref Range Status   SARS Coronavirus 2 NEGATIVE NEGATIVE Final    Comment: (NOTE) SARS-CoV-2 target nucleic acids are NOT DETECTED. The SARS-CoV-2 RNA is generally detectable in upper and lower respiratory specimens during the acute phase of infection. Negative results do not  preclude SARS-CoV-2 infection, do not rule out co-infections with other pathogens, and should not be used as the sole basis for treatment or other patient management decisions. Negative results must be combined with clinical observations, patient history, and epidemiological information. The expected result is Negative. Fact Sheet for Patients: SugarRoll.be Fact Sheet for Healthcare Providers: https://www.woods-mathews.com/ This test is not yet approved or cleared by the [place] FDA and  has been authorized for detection and/or diagnosis of SARS-CoV-2 by FDA under an Emergency Use Authorization (EUA). This EUA will remain  in effect (meaning this test can be used) for the duration of the COVID-19 declaration under Section 56 4(b)(1) of the Act, 21 U.S.C. section 360bbb-3(b)(1), unless the authorization is terminated or revoked sooner. Performed at [facility], [place]  [address] [place], [place] [address]       Radiology Studies: DG Chest 2 View  Result Date: 03/04/2019 CLINICAL DATA:  Chest and abdominal pain EXAM: CHEST - 2 VIEW COMPARISON:  03/03/2019 FINDINGS: Left basilar linear scarring or atelectasis. No pleural effusion or pneumothorax. Stable cardiomediastinal contours. No acute osseous abnormality. IMPRESSION: No acute process in the chest. Electronically Signed   By: Macy Mis M.D.   On: 03/04/2019 14:19   CT Angio Chest/Abd/Pel for Dissection W and/or W/WO  Result Date: 03/04/2019 CLINICAL DATA:  Central chest pain radiating to back and abdomen. Shortness of breath. EXAM: CT ANGIOGRAPHY CHEST, ABDOMEN AND PELVIS TECHNIQUE: Multidetector CT imaging through the chest, abdomen and pelvis was performed using the standard protocol during bolus administration of intravenous contrast. Multiplanar reconstructed images and MIPs were obtained and reviewed to evaluate the vascular anatomy. CONTRAST:  151mL OMNIPAQUE IOHEXOL 350  MG/ML SOLN COMPARISON:  None. FINDINGS: CTA CHEST FINDINGS Cardiovascular: Heart is normal size. Aorta is normal caliber. Scattered aortic calcifications. No aneurysm or dissection. No filling defects in the pulmonary arteries to suggest pulmonary emboli. Mediastinum/Nodes: No mediastinal, hilar, or axillary adenopathy. Trachea and esophagus are unremarkable. Thyroid unremarkable. Lungs/Pleura: No confluent opacities or effusions. Linear areas of atelectasis or scarring in the lung bases. Musculoskeletal: Chest wall soft tissues are unremarkable. No acute bony abnormality. Review of the MIP images confirms the above findings. CTA ABDOMEN AND PELVIS FINDINGS VASCULAR Aorta: Normal caliber aorta without aneurysm, dissection, vasculitis or significant stenosis. Aortic atherosclerosis. Celiac: Patent without evidence of aneurysm, dissection, vasculitis or significant stenosis. SMA: Patent without evidence of aneurysm, dissection, vasculitis or significant stenosis. Renals: Both renal arteries are patent without evidence of aneurysm, dissection, vasculitis, fibromuscular dysplasia or significant stenosis. IMA: Patent without evidence of aneurysm, dissection, vasculitis or significant stenosis. Inflow: Patent without evidence of aneurysm, dissection, vasculitis or significant stenosis. Veins: No obvious venous abnormality within the limitations of this arterial phase study. Review of the MIP images confirms the above findings. NON-VASCULAR Hepatobiliary: Suspect diffuse fatty infiltration. No focal hepatic abnormality. Gallbladder unremarkable. Pancreas: Inflammation/stranding around the pancreas compatible with acute pancreatitis. No focal pancreatic abnormality. Spleen: No focal abnormality.  Normal size. Adrenals/Urinary Tract: Small cysts in the kidneys. 1.6 cm calcification in the lower pole of the right kidney. No hydronephrosis. Adrenal glands and urinary bladder unremarkable. Stomach/Bowel: And left scattered  colonic diverticulosis in both colon. The right no evidence of diverticulitis. Normal appendix. Stomach and small bowel decompressed, unremarkable. Lymphatic: No adenopathy Reproductive: Prostate calcifications. Other: Small amount of free fluid in the cul-de-sac.  No free air. Musculoskeletal: Posttraumatic deformity in the proximal left femur. Old bilateral superior and inferior pubic rami fractures. No acute bone abnormality. Review of the MIP images confirms the above findings. IMPRESSION: No evidence of aortic aneurysm or dissection. No evidence of pulmonary embolus. No acute cardiopulmonary disease. Stranding/inflammation surrounding the pancreas compatible with acute pancreatitis. Small amount of free fluid in the pelvis. Colonic diverticulosis.  No active diverticulitis. Right lower pole nephrolithiasis. Suspect fatty infiltration of the liver. Electronically Signed   By: Rolm Baptise M.D.   On: 03/04/2019 17:25    Scheduled Meds: . enoxaparin (LOVENOX) injection  40 mg Subcutaneous Q24H  . folic acid  1 mg Oral Daily  . ivabradine  5 mg Oral BID WC  . loratadine  10 mg Oral QHS  . multivitamin with minerals  1 tablet Oral Daily  . pantoprazole  40 mg Oral Daily  . psyllium  1 packet Oral Daily  .  sacubitril-valsartan  1 tablet Oral BID  . sodium chloride flush  3 mL Intravenous Once  . thiamine  100 mg Oral Daily   Or  . thiamine  100 mg Intravenous Daily   Continuous Infusions: . sodium chloride 100 mL/hr at 03/05/19 1350  . famotidine (PEPCID) IV 20 mg (03/06/19 0902)  . potassium PHOSPHATE IVPB (in mmol) 30 mmol (03/06/19 0905)     LOS: 1 day   Time spent: 28 minutes   Darliss Cheney, MD [facility]  03/06/2019, 12:43 PM   To contact the attending provider between 7A-7P or the covering provider during after hours 7P-7A, please log into the web site [email or web address] and use password [email or web address].

## 2019-03-06 NOTE — Plan of Care (Signed)
  Problem: Education: Goal: Knowledge of Pancreatitis treatment and prevention will improve Outcome: Progressing   Problem: Health Behavior/Discharge Planning: Goal: Ability to formulate a plan to maintain an alcohol-free life will improve Outcome: Progressing   Problem: Nutritional: Goal: Ability to achieve adequate nutritional intake will improve Outcome: Progressing   Problem: Clinical Measurements: Goal: Complications related to the disease process, condition or treatment will be avoided or minimized Outcome: Progressing   

## 2019-03-06 NOTE — Progress Notes (Signed)
Dr. Doristine Bosworth notified of 4 beats of VT around 1330. Patient is asymptomatic and other vital signs stable.  Will continue to monitor patient.

## 2019-03-07 LAB — BASIC METABOLIC PANEL
Anion gap: 10 (ref 5–15)
BUN: 5 mg/dL — ABNORMAL LOW (ref 8–23)
CO2: 23 mmol/L (ref 22–32)
Calcium: 8 mg/dL — ABNORMAL LOW (ref 8.9–10.3)
Chloride: 104 mmol/L (ref 98–111)
Creatinine, Ser: 0.9 mg/dL (ref 0.61–1.24)
GFR calc Af Amer: >60 mL/min (ref >60)
GFR calc non Af Amer: >60 mL/min (ref >60)
Glucose, Bld: 121 mg/dL — ABNORMAL HIGH (ref 70–99)
Potassium: 3.3 mmol/L — ABNORMAL LOW (ref 3.5–5.1)
Sodium: 137 mmol/L (ref 135–145)

## 2019-03-07 LAB — CBC WITH DIFFERENTIAL/PLATELET
Abs Immature Granulocytes: 0.06 10*3/uL (ref 0.00–0.07)
Basophils Absolute: 0 10*3/uL (ref 0.0–0.1)
Basophils Relative: 0 %
Eosinophils Absolute: 0.1 10*3/uL (ref 0.0–0.5)
Eosinophils Relative: 1 %
HCT: 33.8 % — ABNORMAL LOW (ref 39.0–52.0)
Hemoglobin: 11.1 g/dL — ABNORMAL LOW (ref 13.0–17.0)
Immature Granulocytes: 1 %
Lymphocytes Relative: 8 %
Lymphs Abs: 0.8 10*3/uL (ref 0.7–4.0)
MCH: 29.5 pg (ref 26.0–34.0)
MCHC: 32.8 g/dL (ref 30.0–36.0)
MCV: 89.9 fL (ref 80.0–100.0)
Monocytes Absolute: 0.9 10*3/uL (ref 0.1–1.0)
Monocytes Relative: 9 %
Neutro Abs: 8 10*3/uL — ABNORMAL HIGH (ref 1.7–7.7)
Neutrophils Relative %: 81 %
Platelets: 161 10*3/uL (ref 150–400)
RBC: 3.76 MIL/uL — ABNORMAL LOW (ref 4.22–5.81)
RDW: 17.4 % — ABNORMAL HIGH (ref 11.5–15.5)
WBC: 9.8 10*3/uL (ref 4.0–10.5)
nRBC: 0 % (ref 0.0–0.2)

## 2019-03-07 LAB — PHOSPHORUS: Phosphorus: 2.3 mg/dL — ABNORMAL LOW (ref 2.5–4.6)

## 2019-03-07 MED ORDER — POTASSIUM PHOSPHATES 15 MMOLE/5ML IV SOLN
30.0000 mmol | Freq: Once | INTRAVENOUS | Status: DC
  Filled 2019-03-07: qty 10

## 2019-03-07 MED ORDER — POTASSIUM CHLORIDE CRYS ER 20 MEQ PO TBCR
40.0000 meq | EXTENDED_RELEASE_TABLET | Freq: Once | ORAL | Status: AC
  Administered 2019-03-07: 40 meq via ORAL
  Filled 2019-03-07: qty 2

## 2019-03-07 NOTE — Discharge Summary (Signed)
Physician Discharge Summary  Ryan [name] [id number] DOB: 1950-06-07 DOA: 03/04/2019  PCP: [facility]  Admit date: 03/04/2019 Discharge date: 03/07/2019  Admitted From: Home Disposition: Home  Recommendations for Outpatient Follow-up:  1. Follow up with PCP in 1-2 weeks 2. Please obtain BMP/CBC in one week 3. Please follow up on the following pending results:  Home Health: None Equipment/Devices: None  Discharge Condition: Stable CODE STATUS: Full code Diet recommendation: Low fiber diet/soft diet/avoid meat for the next few days  Subjective: Seen and examined.  Still with abdominal pain but improved.  No other complaint.  Tolerating regular diet.  HPI: Ryan [name] is a 68 y.o. male with medical history significant of EtOH abuse, alcoholic cardiomyopathy with EF 10-15% on echo just a couple of month ago, prior EtOH pancreatitis.  Patient presents to ED with c/o epigastric pain, CP, and vomiting.  Symptoms onset after he drank a couple of beers while watching a football game a couple of days ago.  Drinks very rarely now though used to be heavy drinker in past.  Pain is constant, radiates to LUQ and L chest.   ED Course: Acute pancreatitis on CT scan with lipase of 100.  Brief/Interim Summary: Patient was admitted with acute alcoholic pancreatitis.  Gallbladder was normal.  Triglycerides were normal.  He was treated conservatively with pain management and was kept n.p.o. initially and subsequently started on clear liquids and advance to regular diet which he has been tolerating with very minimal abdominal pain but no nausea.  Vitals remained stable.  He had been having persistent hypophosphatemia and hypokalemia for which replacements were given.  He will be given potassium chloride and potassium phosphate before discharge today.  Follow with PCP and repeat those labs.  Discharge Diagnoses:  Principal Problem:   Alcohol-induced acute pancreatitis  without infection or necrosis Active Problems:   Essential hypertension   Hypokalemia   Alcohol abuse   Chronic combined systolic and diastolic CHF (congestive heart failure) (HCC)   Hx of chronic pancreatitis   Hypophosphatemia   Acute alcoholic pancreatitis    Discharge Instructions  Discharge Instructions    Discharge patient   Complete by: As directed    Discharge patient after he has been given phosphate and potassium chloride replacement that I have ordered today.   Discharge disposition: 01-Home or Self Care   Discharge patient date: 03/07/2019     Allergies as of 03/07/2019      Reactions   Tape Other (See Comments)   "TAPE TEARS OFF MY SKIN"      Medication List    TAKE these medications   acetaminophen 325 MG tablet Commonly known as: TYLENOL Take 650 mg by mouth every 6 (six) hours as needed for mild pain.   aspirin EC 81 MG tablet Take 81 mg by mouth every 4 (four) hours as needed for moderate pain.   diclofenac sodium 1 % Gel Commonly known as: VOLTAREN Apply 1 application topically 2 (two) times daily as needed (to painful sites).   gabapentin 300 MG capsule Commonly known as: NEURONTIN Take 300 mg by mouth 2 (two) times daily as needed (for nerve pain).   ivabradine 5 MG Tabs tablet Commonly known as: CORLANOR Take 1 tablet (5 mg total) by mouth 2 (two) times daily with a meal.   loratadine 10 MG tablet Commonly known as: CLARITIN Take 10 mg by mouth at bedtime.   pantoprazole 40 MG tablet Commonly known as: PROTONIX Take 1 tablet (40  mg total) by mouth daily.   ProAir HFA 108 (90 Base) MCG/ACT inhaler Generic drug: albuterol Inhale 2 puffs into the lungs every 6 (six) hours as needed for wheezing or shortness of breath.   psyllium 95 % Pack Commonly known as: HYDROCIL/METAMUCIL Take 1 packet by mouth daily.   sacubitril-valsartan 24-26 MG Commonly known as: ENTRESTO Take 1 tablet by mouth 2 (two) times daily.      [place] Follow up in 1 week(s).   Specialty: General Practice Contact information: [place] [address] [phone]        Bensimhon, Shaune Pascal, MD .   Specialty: Cardiology Contact information: [place] [address] [phone]          Allergies  Allergen Reactions  . Tape Other (See Comments)    "TAPE TEARS OFF MY SKIN"    Consultations: None   Procedures/Studies: DG Chest 2 View  Result Date: 03/04/2019 CLINICAL DATA:  Chest and abdominal pain EXAM: CHEST - 2 VIEW COMPARISON:  03/03/2019 FINDINGS: Left basilar linear scarring or atelectasis. No pleural effusion or pneumothorax. Stable cardiomediastinal contours. No acute osseous abnormality. IMPRESSION: No acute process in the chest. Electronically Signed   By: Macy Mis M.D.   On: 03/04/2019 14:19   CT Angio Chest/Abd/Pel for Dissection W and/or W/WO  Result Date: 03/04/2019 CLINICAL DATA:  Central chest pain radiating to back and abdomen. Shortness of breath. EXAM: CT ANGIOGRAPHY CHEST, ABDOMEN AND PELVIS TECHNIQUE: Multidetector CT imaging through the chest, abdomen and pelvis was performed using the standard protocol during bolus administration of intravenous contrast. Multiplanar reconstructed images and MIPs were obtained and reviewed to evaluate the vascular anatomy. CONTRAST:  138mL OMNIPAQUE IOHEXOL 350 MG/ML SOLN COMPARISON:  None. FINDINGS: CTA CHEST FINDINGS Cardiovascular: Heart is normal size. Aorta is normal caliber. Scattered aortic calcifications. No aneurysm or dissection. No filling defects in the pulmonary arteries to suggest pulmonary emboli. Mediastinum/Nodes: No mediastinal, hilar, or axillary adenopathy. Trachea and esophagus are unremarkable. Thyroid unremarkable. Lungs/Pleura: No confluent opacities or effusions. Linear areas of atelectasis or scarring in the lung bases. Musculoskeletal: Chest wall soft tissues are  unremarkable. No acute bony abnormality. Review of the MIP images confirms the above findings. CTA ABDOMEN AND PELVIS FINDINGS VASCULAR Aorta: Normal caliber aorta without aneurysm, dissection, vasculitis or significant stenosis. Aortic atherosclerosis. Celiac: Patent without evidence of aneurysm, dissection, vasculitis or significant stenosis. SMA: Patent without evidence of aneurysm, dissection, vasculitis or significant stenosis. Renals: Both renal arteries are patent without evidence of aneurysm, dissection, vasculitis, fibromuscular dysplasia or significant stenosis. IMA: Patent without evidence of aneurysm, dissection, vasculitis or significant stenosis. Inflow: Patent without evidence of aneurysm, dissection, vasculitis or significant stenosis. Veins: No obvious venous abnormality within the limitations of this arterial phase study. Review of the MIP images confirms the above findings. NON-VASCULAR Hepatobiliary: Suspect diffuse fatty infiltration. No focal hepatic abnormality. Gallbladder unremarkable. Pancreas: Inflammation/stranding around the pancreas compatible with acute pancreatitis. No focal pancreatic abnormality. Spleen: No focal abnormality.  Normal size. Adrenals/Urinary Tract: Small cysts in the kidneys. 1.6 cm calcification in the lower pole of the right kidney. No hydronephrosis. Adrenal glands and urinary bladder unremarkable. Stomach/Bowel: And left scattered colonic diverticulosis in both colon. The right no evidence of diverticulitis. Normal appendix. Stomach and small bowel decompressed, unremarkable. Lymphatic: No adenopathy Reproductive: Prostate calcifications. Other: Small amount of free fluid in the cul-de-sac.  No free air. Musculoskeletal: Posttraumatic deformity in the  proximal left femur. Old bilateral superior and inferior pubic rami fractures. No acute bone abnormality. Review of the MIP images confirms the above findings. IMPRESSION: No evidence of aortic aneurysm or  dissection. No evidence of pulmonary embolus. No acute cardiopulmonary disease. Stranding/inflammation surrounding the pancreas compatible with acute pancreatitis. Small amount of free fluid in the pelvis. Colonic diverticulosis.  No active diverticulitis. Right lower pole nephrolithiasis. Suspect fatty infiltration of the liver. Electronically Signed   By: Rolm Baptise M.D.   On: 03/04/2019 17:25      Discharge Exam: Vitals:   03/06/19 2044 03/07/19 0439  BP: (!) 118/59 126/75  Pulse: 75 79  Resp: 20 18  Temp: 99 F (37.2 C) 98.2 F (36.8 C)  SpO2: 95% 97%   Vitals:   03/06/19 0448 03/06/19 1443 03/06/19 2044 03/07/19 0439  BP: (!) 112/55 109/62 (!) 118/59 126/75  Pulse: 91 79 75 79  Resp: 20 16 20 18   Temp: 98.7 F (37.1 C) 99 F (37.2 C) 99 F (37.2 C) 98.2 F (36.8 C)  TempSrc: Oral Oral Oral Oral  SpO2: 99% 100% 95% 97%    General: Pt is alert, awake, not in acute distress Cardiovascular: RRR, S1/S2 +, no rubs, no gallops Respiratory: CTA bilaterally, no wheezing, no rhonchi Abdominal: Soft, NT, ND, bowel sounds + Extremities: no edema, no cyanosis    The results of significant diagnostics from this hospitalization (including imaging, microbiology, ancillary and laboratory) are listed below for reference.     Microbiology: Recent Results (from the past 240 hour(s))  SARS CORONAVIRUS 2 (TAT 6-24 HRS) Nasopharyngeal Nasopharyngeal Swab     Status: None   Collection Time: 03/04/19  5:44 PM   Specimen: Nasopharyngeal Swab  Result Value Ref Range Status   SARS Coronavirus 2 NEGATIVE NEGATIVE Final    Comment: (NOTE) SARS-CoV-2 target nucleic acids are NOT DETECTED. The SARS-CoV-2 RNA is generally detectable in upper and lower respiratory specimens during the acute phase of infection. Negative results do not preclude SARS-CoV-2 infection, do not rule out co-infections with other pathogens, and should not be used as the sole basis for treatment or other patient  management decisions. Negative results must be combined with clinical observations, patient history, and epidemiological information. The expected result is Negative. Fact Sheet for Patients: SugarRoll.be Fact Sheet for Healthcare Providers: https://www.woods-mathews.com/ This test is not yet approved or cleared by the [place] FDA and  has been authorized for detection and/or diagnosis of SARS-CoV-2 by FDA under an Emergency Use Authorization (EUA). This EUA will remain  in effect (meaning this test can be used) for the duration of the COVID-19 declaration under Section 56 4(b)(1) of the Act, 21 U.S.C. section 360bbb-3(b)(1), unless the authorization is terminated or revoked sooner. Performed at [facility], [place] [address], [place], [place] [address]      Labs: BNP (last 3 results) Recent Labs    10/15/18 1700 10/30/18 1242  BNP 535.5* 123456*   Basic Metabolic Panel: Recent Labs  Lab 03/04/19 1345 03/04/19 2048 03/05/19 0215 03/06/19 0424 03/07/19 0143  NA 135  --  135 135 137  K 3.7  --  3.4* 3.2* 3.3*  CL 101  --  99 98 104  CO2 24  --  26 26 23   GLUCOSE 145*  --  107* 116* 121*  BUN 8  --  6* 5* 5*  CREATININE 1.17  --  0.95 0.77 0.90  CALCIUM 8.9  --  8.6* 8.1* 8.0*  MG  --  1.9  --  1.8  --   PHOS  --  2.2*  --  1.9* 2.3*   Liver Function Tests: Recent Labs  Lab 03/04/19 1426 03/05/19 0215  AST 28 18  ALT 19 14  ALKPHOS 83 78  BILITOT 0.5 0.6  PROT 7.1 6.2*  ALBUMIN 3.5 3.1*   Recent Labs  Lab 03/04/19 1426  LIPASE 103*   No results for input(s): AMMONIA in the last 168 hours. CBC: Recent Labs  Lab 03/04/19 1345 03/05/19 0215 03/07/19 0143  WBC 18.6* 16.0* 9.8  NEUTROABS  --   --  8.0*  HGB 14.3 13.1 11.1*  HCT 43.3 39.2 33.8*  MCV 91.0 90.3 89.9  PLT 211 165 161   Cardiac Enzymes: No results for input(s): CKTOTAL, CKMB, CKMBINDEX, TROPONINI in the last 168 hours. BNP: Invalid  input(s): POCBNP CBG: No results for input(s): GLUCAP in the last 168 hours. D-Dimer No results for input(s): DDIMER in the last 72 hours. Hgb A1c No results for input(s): HGBA1C in the last 72 hours. Lipid Profile Recent Labs    03/05/19 1036  CHOL 131  HDL 66  LDLCALC 57  TRIG 40  CHOLHDL 2.0   Thyroid function studies No results for input(s): TSH, T4TOTAL, T3FREE, THYROIDAB in the last 72 hours.  Invalid input(s): FREET3 Anemia work up No results for input(s): VITAMINB12, FOLATE, FERRITIN, TIBC, IRON, RETICCTPCT in the last 72 hours. Urinalysis    Component Value Date/Time   COLORURINE YELLOW 03/05/2018 1600   APPEARANCEUR HAZY (A) 03/05/2018 1600   LABSPEC 1.016 03/05/2018 1600   PHURINE 8.0 03/05/2018 1600   GLUCOSEU NEGATIVE 03/05/2018 1600   HGBUR NEGATIVE 03/05/2018 1600   BILIRUBINUR NEGATIVE 03/05/2018 1600   KETONESUR NEGATIVE 03/05/2018 1600   PROTEINUR NEGATIVE 03/05/2018 1600   UROBILINOGEN 0.2 04/23/2013 2034   NITRITE POSITIVE (A) 03/05/2018 1600   LEUKOCYTESUR MODERATE (A) 03/05/2018 1600   Sepsis Labs Invalid input(s): PROCALCITONIN,  WBC,  LACTICIDVEN Microbiology Recent Results (from the past 240 hour(s))  SARS CORONAVIRUS 2 (TAT 6-24 HRS) Nasopharyngeal Nasopharyngeal Swab     Status: None   Collection Time: 03/04/19  5:44 PM   Specimen: Nasopharyngeal Swab  Result Value Ref Range Status   SARS Coronavirus 2 NEGATIVE NEGATIVE Final    Comment: (NOTE) SARS-CoV-2 target nucleic acids are NOT DETECTED. The SARS-CoV-2 RNA is generally detectable in upper and lower respiratory specimens during the acute phase of infection. Negative results do not preclude SARS-CoV-2 infection, do not rule out co-infections with other pathogens, and should not be used as the sole basis for treatment or other patient management decisions. Negative results must be combined with clinical observations, patient history, and epidemiological information. The  expected result is Negative. Fact Sheet for Patients: SugarRoll.be Fact Sheet for Healthcare Providers: https://www.woods-mathews.com/ This test is not yet approved or cleared by the [place] FDA and  has been authorized for detection and/or diagnosis of SARS-CoV-2 by FDA under an Emergency Use Authorization (EUA). This EUA will remain  in effect (meaning this test can be used) for the duration of the COVID-19 declaration under Section 56 4(b)(1) of the Act, 21 U.S.C. section 360bbb-3(b)(1), unless the authorization is terminated or revoked sooner. Performed at [facility], [place] [address] [place], [place] [address]      Time coordinating discharge: Over 30 minutes  SIGNED:   Darliss Cheney, MD  [facility] 03/07/2019, 8:50 AM  If 7PM-7AM, please contact night-coverage [email or web address] Password [email or web address]

## 2019-03-07 NOTE — Plan of Care (Signed)
  Problem: Education: Goal: Knowledge of Pancreatitis treatment and prevention will improve Outcome: Progressing   Problem: Health Behavior/Discharge Planning: Goal: Ability to formulate a plan to maintain an alcohol-free life will improve Outcome: Progressing   Problem: Nutritional: Goal: Ability to achieve adequate nutritional intake will improve Outcome: Progressing   Problem: Clinical Measurements: Goal: Complications related to the disease process, condition or treatment will be avoided or minimized Outcome: Progressing   

## 2019-03-07 NOTE — Discharge Instructions (Signed)
Alcohol Abuse and Dependence Information, Adult Alcohol is a widely available drug. People drink alcohol in different amounts. People who drink alcohol very often and in large amounts often have problems during and after drinking. They may develop what is called an alcohol use disorder. There are two main types of alcohol use disorders:  Alcohol abuse. This is when you use alcohol too much or too often. You may use alcohol to make yourself feel happy or to reduce stress. You may have a hard time setting a limit on the amount you drink.  Alcohol dependence. This is when you use alcohol consistently for a period of time, and your body changes as a result. This can make it hard to stop drinking because you may start to feel sick or feel different when you do not use alcohol. These symptoms are known as withdrawal. How can alcohol abuse and dependence affect me? Alcohol abuse and dependence can have a negative effect on your life. Drinking too much can lead to addiction. You may feel like you need alcohol to function normally. You may drink alcohol before work in the morning, during the day, or as soon as you get home from work in the evening. These actions can result in:  Poor work performance.  Job loss.  Financial problems.  Car crashes or criminal charges from driving after drinking alcohol.  Problems in your relationships with friends and family.  Losing the trust and respect of coworkers, friends, and family. Drinking heavily over a long period of time can permanently damage your body and brain, and can cause lifelong health issues, such as:  Damage to your liver or pancreas.  Heart problems, high blood pressure, or stroke.  Certain cancers.  Decreased ability to fight infections.  Brain or nerve damage.  Depression.  Early (premature) death. If you are careless or you crave alcohol, it is easy to drink more than your body can handle (overdose). Alcohol overdose is a serious  situation that requires hospitalization. It may lead to permanent injuries or death. What can increase my risk?  Having a family history of alcohol abuse.  Having depression or other mental health conditions.  Beginning to drink at an early age.  Binge drinking often.  Experiencing trauma, stress, and an unstable home life during childhood.  Spending time with people who drink often. What actions can I take to prevent or manage alcohol abuse and dependence?  Do not drink alcohol if: ? Your health care provider tells you not to drink. ? You are pregnant, may be pregnant, or are planning to become pregnant.  If you drink alcohol: ? Limit how much you use to:  0-1 drink a day for women.  0-2 drinks a day for men. ? Be aware of how much alcohol is in your drink. In the [place] one drink equals one 12 oz bottle of beer (355 mL), one 5 oz glass of wine (148 mL), or one 1 oz glass of hard liquor (44 mL).  Stop drinking if you have been drinking too much. This can be very hard to do if you are used to abusing alcohol. If you begin to have withdrawal symptoms, talk with your health care provider or a person that you trust. These symptoms may include anxiety, shaky hands, headache, nausea, sweating, or not being able to sleep.  Choose to drink nonalcoholic beverages in social gatherings and places where there may be alcohol. Activity  Spend more time on activities that you enjoy that do   not involve alcohol, like hobbies or exercise.  Find healthy ways to cope with stress, such as exercise, meditation, or spending time with people you care about. General information  Talk to your family, coworkers, and friends about supporting you in your efforts to stop drinking. If they drink, ask them not to drink around you. Spend more time with people who do not drink alcohol.  If you think that you have an alcohol dependency problem: ? Tell friends or family about your concerns. ? Talk with your  health care provider or another health professional about where to get help. ? Work with a therapist and a chemical dependency counselor. ? Consider joining a support group for people who struggle with alcohol abuse and dependence. Where to find support   Your health care provider.  SMART Recovery: www.smartrecovery.org Therapy and support groups  Local treatment centers or chemical dependency counselors.  Local AA groups in your community: www.aa.org Where to find more information  Centers for Disease Control and Prevention: www.cdc.gov  National Institute on Alcohol Abuse and Alcoholism: www.niaaa.nih.gov  Alcoholics Anonymous (AA): www.aa.org Contact a health care provider if:  You drank more or for longer than you intended on more than one occasion.  You tried to stop drinking or to cut back on how much you drink, but you were not able to.  You often drink to the point of vomiting or passing out.  You want to drink so badly that you cannot think about anything else.  You have problems in your life due to drinking, but you continue to drink.  You keep drinking even though you feel anxious, depressed, or have experienced memory loss.  You have stopped doing the things you used to enjoy in order to drink.  You have to drink more than you used to in order to get the effect you want.  You experience anxiety, sweating, nausea, shakiness, and trouble sleeping when you try to stop drinking. Get help right away if:  You have thoughts about hurting yourself or others.  You have serious withdrawal symptoms, including: ? Confusion. ? Racing heart. ? High blood pressure. ? Fever. If you ever feel like you may hurt yourself or others, or have thoughts about taking your own life, get help right away. You can go to your nearest emergency department or call:  Your local emergency services (911 in the [place]  A suicide crisis helpline, such as the National Suicide Prevention  Lifeline at 1-800-273-8255. This is open 24 hours a day. Summary  Alcohol abuse and dependence can have a negative effect on your life. Drinking too much or too often can lead to addiction.  If you drink alcohol, limit how much you use.  If you are having trouble keeping your drinking under control, find ways to change your behavior. Hobbies, calming activities, exercise, or support groups can help.  If you feel you need help with changing your drinking habits, talk with your health care provider, a good friend, or a therapist, or go to an AA group. This information is not intended to replace advice given to you by your health care provider. Make sure you discuss any questions you have with your health care provider. Document Released: 02/22/2016 Document Revised: 06/18/2018 Document Reviewed: 05/07/2018 Elsevier Patient Education  2020 [employer].  

## 2019-03-18 ENCOUNTER — Telehealth (HOSPITAL_COMMUNITY): Payer: Self-pay | Admitting: Cardiology

## 2019-03-18 NOTE — Telephone Encounter (Signed)
Patient called to request medical records to bring with him to PCP appt and [facility] Advised we could fax records and or he is more than welcome to pick up records.  Reports he will come by office to pick up information Advised will need to complete release of information and patient voiced understanding

## 2019-08-06 ENCOUNTER — Other Ambulatory Visit: Payer: Self-pay

## 2019-08-06 ENCOUNTER — Other Ambulatory Visit (HOSPITAL_COMMUNITY): Payer: Self-pay

## 2019-08-06 ENCOUNTER — Ambulatory Visit (HOSPITAL_COMMUNITY)
Admission: RE | Admit: 2019-08-06 | Discharge: 2019-08-06 | Disposition: A | Discharge status: Home / Self Care | Payer: Medicare Other | Source: Ambulatory Visit | Attending: Internal Medicine | Admitting: Internal Medicine

## 2019-08-06 VITALS — BP 106/70 | HR 109 | Wt 215.6 lb

## 2019-08-06 DIAGNOSIS — I130 Hypertensive heart and chronic kidney disease with heart failure and stage 1 through stage 4 chronic kidney disease, or unspecified chronic kidney disease: Secondary | ICD-10-CM | POA: Insufficient documentation

## 2019-08-06 DIAGNOSIS — I428 Other cardiomyopathies: Secondary | ICD-10-CM | POA: Insufficient documentation

## 2019-08-06 DIAGNOSIS — K219 Gastro-esophageal reflux disease without esophagitis: Secondary | ICD-10-CM | POA: Insufficient documentation

## 2019-08-06 DIAGNOSIS — F1010 Alcohol abuse, uncomplicated: Secondary | ICD-10-CM | POA: Insufficient documentation

## 2019-08-06 DIAGNOSIS — R000 Tachycardia, unspecified: Secondary | ICD-10-CM | POA: Insufficient documentation

## 2019-08-06 DIAGNOSIS — F17210 Nicotine dependence, cigarettes, uncomplicated: Secondary | ICD-10-CM | POA: Insufficient documentation

## 2019-08-06 DIAGNOSIS — M79605 Pain in left leg: Secondary | ICD-10-CM | POA: Insufficient documentation

## 2019-08-06 DIAGNOSIS — R079 Chest pain, unspecified: Secondary | ICD-10-CM | POA: Diagnosis not present

## 2019-08-06 DIAGNOSIS — I10 Essential (primary) hypertension: Secondary | ICD-10-CM

## 2019-08-06 DIAGNOSIS — Z9119 Patient's noncompliance with other medical treatment and regimen: Secondary | ICD-10-CM | POA: Diagnosis not present

## 2019-08-06 DIAGNOSIS — H409 Unspecified glaucoma: Secondary | ICD-10-CM | POA: Diagnosis not present

## 2019-08-06 DIAGNOSIS — Z7982 Long term (current) use of aspirin: Secondary | ICD-10-CM | POA: Diagnosis not present

## 2019-08-06 DIAGNOSIS — F1011 Alcohol abuse, in remission: Secondary | ICD-10-CM | POA: Diagnosis not present

## 2019-08-06 DIAGNOSIS — I5042 Chronic combined systolic (congestive) and diastolic (congestive) heart failure: Secondary | ICD-10-CM | POA: Diagnosis not present

## 2019-08-06 DIAGNOSIS — Z79899 Other long term (current) drug therapy: Secondary | ICD-10-CM | POA: Insufficient documentation

## 2019-08-06 DIAGNOSIS — Z8249 Family history of ischemic heart disease and other diseases of the circulatory system: Secondary | ICD-10-CM | POA: Diagnosis not present

## 2019-08-06 DIAGNOSIS — F17200 Nicotine dependence, unspecified, uncomplicated: Secondary | ICD-10-CM

## 2019-08-06 DIAGNOSIS — I272 Pulmonary hypertension, unspecified: Secondary | ICD-10-CM | POA: Diagnosis not present

## 2019-08-06 DIAGNOSIS — R0602 Shortness of breath: Secondary | ICD-10-CM | POA: Diagnosis present

## 2019-08-06 DIAGNOSIS — N183 Chronic kidney disease, stage 3 unspecified: Secondary | ICD-10-CM | POA: Insufficient documentation

## 2019-08-06 LAB — COMPREHENSIVE METABOLIC PANEL
ALT: 40 U/L (ref 0–44)
AST: 38 U/L (ref 15–41)
Albumin: 3.8 g/dL (ref 3.5–5.0)
Alkaline Phosphatase: 101 U/L (ref 38–126)
Anion gap: 8 (ref 5–15)
BUN: 13 mg/dL (ref 8–23)
CO2: 22 mmol/L (ref 22–32)
Calcium: 9.3 mg/dL (ref 8.9–10.3)
Chloride: 107 mmol/L (ref 98–111)
Creatinine, Ser: 1.09 mg/dL (ref 0.61–1.24)
GFR calc Af Amer: >60 mL/min (ref >60)
GFR calc non Af Amer: >60 mL/min (ref >60)
Glucose, Bld: 88 mg/dL (ref 70–99)
Potassium: 3.9 mmol/L (ref 3.5–5.1)
Sodium: 137 mmol/L (ref 135–145)
Total Bilirubin: 0.4 mg/dL (ref 0.3–1.2)
Total Protein: 7 g/dL (ref 6.5–8.1)

## 2019-08-06 LAB — BRAIN NATRIURETIC PEPTIDE: B Natriuretic Peptide: 139.6 pg/mL — ABNORMAL HIGH (ref 0.0–100.0)

## 2019-08-06 LAB — CBC
HCT: 41.9 % (ref 39.0–52.0)
Hemoglobin: 13.1 g/dL (ref 13.0–17.0)
MCH: 30.1 pg (ref 26.0–34.0)
MCHC: 31.3 g/dL (ref 30.0–36.0)
MCV: 96.3 fL (ref 80.0–100.0)
Platelets: 214 10*3/uL (ref 150–400)
RBC: 4.35 MIL/uL (ref 4.22–5.81)
RDW: 15.6 % — ABNORMAL HIGH (ref 11.5–15.5)
WBC: 8.8 10*3/uL (ref 4.0–10.5)
nRBC: 0 % (ref 0.0–0.2)

## 2019-08-06 LAB — DIGOXIN LEVEL: Digoxin Level: <0.2 ng/mL — ABNORMAL LOW (ref 0.8–2.0)

## 2019-08-06 MED ORDER — IVABRADINE HCL 5 MG PO TABS: 5.0000 mg | ORAL_TABLET | Freq: Two times a day (BID) | ORAL | 6 refills | Status: DC

## 2019-08-06 MED ORDER — SPIRONOLACTONE 25 MG PO TABS: 12.5000 mg | ORAL_TABLET | Freq: Every day | ORAL | 3 refills | Status: DC
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
## 2019-08-06 NOTE — Patient Instructions (Addendum)
Labs done today. We will contact you only if your labs are abnormal.  START Spironolactone 12.5mg (1/2) tablet by mouth daily.  Please contact our office at [phone] option 2 to let us know if you are still taking  Ivabradine(colonar) 5mg (1 tablet) by mouth 2 times daily.   No other medication changes have been made. Please continue all other medications as prescribed.  Your physician has requested that you have a cardiac MRI. Cardiac MRI uses a computer to create images of your heart as its beating, producing both still and moving pictures of your heart and major blood vessels. For further information please visit [email or web address]  We will contact you at a later date to schedule an appointment.   Your physician recommends that you schedule a follow-up appointment in: 3 month  At the [facility], you and your health needs are our priority. As part of our continuing mission to provide you with exceptional heart care, we have created designated Provider Care Teams. These Care Teams include your primary Cardiologist (physician) and Advanced Practice Providers (APPs- Physician Assistants and Nurse Practitioners) who all work together to provide you with the care you need, when you need it.   You may see any of the following providers on your designated Care Team at your next follow up: Marland Kitchen Dr Glori Bickers . Dr Loralie Champagne . Darrick Grinder, NP . Lyda Jester, PA . Audry Riles, PharmD   Please be sure to bring in all your medications bottles to every appointment.

## 2019-08-06 NOTE — Telephone Encounter (Signed)
Patient and patients wife returned call. Patients wife reports that she doesn't recall him being prescribed Corlanor 5mg  bid. Per Dr. Gillermina Hu note from today patient will restart Corlanor

## 2019-10-08 ENCOUNTER — Other Ambulatory Visit: Payer: Self-pay

## 2019-10-08 ENCOUNTER — Emergency Department (HOSPITAL_COMMUNITY)
Admission: EM | Admit: 2019-10-08 | Discharge: 2019-10-08 | Disposition: A | Discharge status: Home / Self Care | Payer: No Typology Code available for payment source | Attending: Emergency Medicine | Admitting: Emergency Medicine

## 2019-10-08 ENCOUNTER — Encounter (HOSPITAL_COMMUNITY): Payer: Self-pay | Admitting: *Deleted

## 2019-10-08 ENCOUNTER — Emergency Department (HOSPITAL_COMMUNITY): Payer: No Typology Code available for payment source

## 2019-10-08 ENCOUNTER — Telehealth (HOSPITAL_COMMUNITY): Payer: Self-pay | Admitting: Licensed Clinical Social Worker

## 2019-10-08 DIAGNOSIS — Z7982 Long term (current) use of aspirin: Secondary | ICD-10-CM | POA: Diagnosis not present

## 2019-10-08 DIAGNOSIS — Z85068 Personal history of other malignant neoplasm of small intestine: Secondary | ICD-10-CM | POA: Insufficient documentation

## 2019-10-08 DIAGNOSIS — J449 Chronic obstructive pulmonary disease, unspecified: Secondary | ICD-10-CM | POA: Insufficient documentation

## 2019-10-08 DIAGNOSIS — N183 Chronic kidney disease, stage 3 unspecified: Secondary | ICD-10-CM | POA: Diagnosis not present

## 2019-10-08 DIAGNOSIS — I5043 Acute on chronic combined systolic (congestive) and diastolic (congestive) heart failure: Secondary | ICD-10-CM

## 2019-10-08 DIAGNOSIS — R079 Chest pain, unspecified: Secondary | ICD-10-CM | POA: Diagnosis not present

## 2019-10-08 DIAGNOSIS — I509 Heart failure, unspecified: Secondary | ICD-10-CM | POA: Diagnosis not present

## 2019-10-08 DIAGNOSIS — Z79899 Other long term (current) drug therapy: Secondary | ICD-10-CM | POA: Diagnosis not present

## 2019-10-08 DIAGNOSIS — I110 Hypertensive heart disease with heart failure: Secondary | ICD-10-CM | POA: Diagnosis not present

## 2019-10-08 DIAGNOSIS — F17210 Nicotine dependence, cigarettes, uncomplicated: Secondary | ICD-10-CM | POA: Insufficient documentation

## 2019-10-08 DIAGNOSIS — I130 Hypertensive heart and chronic kidney disease with heart failure and stage 1 through stage 4 chronic kidney disease, or unspecified chronic kidney disease: Secondary | ICD-10-CM | POA: Insufficient documentation

## 2019-10-08 DIAGNOSIS — I10 Essential (primary) hypertension: Secondary | ICD-10-CM

## 2019-10-08 LAB — CBC
HCT: 40.3 % (ref 39.0–52.0)
Hemoglobin: 12.9 g/dL — ABNORMAL LOW (ref 13.0–17.0)
MCH: 29.9 pg (ref 26.0–34.0)
MCHC: 32 g/dL (ref 30.0–36.0)
MCV: 93.3 fL (ref 80.0–100.0)
Platelets: 266 10*3/uL (ref 150–400)
RBC: 4.32 MIL/uL (ref 4.22–5.81)
RDW: 15.2 % (ref 11.5–15.5)
WBC: 7.6 10*3/uL (ref 4.0–10.5)
nRBC: 0 % (ref 0.0–0.2)

## 2019-10-08 LAB — BASIC METABOLIC PANEL
Anion gap: 6 (ref 5–15)
BUN: 11 mg/dL (ref 8–23)
CO2: 27 mmol/L (ref 22–32)
Calcium: 9.4 mg/dL (ref 8.9–10.3)
Chloride: 104 mmol/L (ref 98–111)
Creatinine, Ser: 1.22 mg/dL (ref 0.61–1.24)
GFR calc Af Amer: >60 mL/min (ref >60)
GFR calc non Af Amer: 60 mL/min — ABNORMAL LOW (ref >60)
Glucose, Bld: 122 mg/dL — ABNORMAL HIGH (ref 70–99)
Potassium: 4.3 mmol/L (ref 3.5–5.1)
Sodium: 137 mmol/L (ref 135–145)

## 2019-10-08 LAB — TROPONIN I (HIGH SENSITIVITY)
Troponin I (High Sensitivity): 22 ng/L — ABNORMAL HIGH (ref <18)
Troponin I (High Sensitivity): 23 ng/L — ABNORMAL HIGH (ref <18)

## 2019-10-08 LAB — BRAIN NATRIURETIC PEPTIDE: B Natriuretic Peptide: 630.6 pg/mL — ABNORMAL HIGH (ref 0.0–100.0)

## 2019-10-08 MED ORDER — AEROCHAMBER PLUS FLO-VU LARGE MISC: 1.0000 | Freq: Once | Status: AC

## 2019-10-08 MED ORDER — SODIUM CHLORIDE 0.9% FLUSH: 3.0000 mL | Freq: Once | INTRAVENOUS | Status: DC

## 2019-10-08 MED ORDER — PANTOPRAZOLE SODIUM 40 MG PO TBEC: 40.0000 mg | DELAYED_RELEASE_TABLET | Freq: Every day | ORAL | 0 refills | Status: DC

## 2019-10-08 MED ORDER — ALUM & MAG HYDROXIDE-SIMETH 200-200-20 MG/5ML PO SUSP
30.0000 mL | Freq: Once | ORAL | Status: AC
  Administered 2019-10-08: 30 mL via ORAL
  Filled 2019-10-08: qty 30

## 2019-10-08 MED ORDER — FUROSEMIDE 40 MG PO TABS
40.0000 mg | ORAL_TABLET | Freq: Every day | ORAL | 0 refills | Status: DC
Start: 2019-10-08 — End: 2020-10-29

## 2019-10-08 MED ORDER — SPIRONOLACTONE 25 MG PO TABS: 25.0000 mg | ORAL_TABLET | Freq: Every day | ORAL | 0 refills | Status: DC

## 2019-10-08 MED ORDER — ALBUTEROL SULFATE HFA 108 (90 BASE) MCG/ACT IN AERS
2.0000 | INHALATION_SPRAY | Freq: Once | RESPIRATORY_TRACT | Status: AC
  Administered 2019-10-08: 2 via RESPIRATORY_TRACT
  Filled 2019-10-08: qty 6.7

## 2019-10-08 MED ORDER — AEROCHAMBER PLUS FLO-VU LARGE MISC
Status: AC
  Administered 2019-10-08: 1
  Filled 2019-10-08: qty 1

## 2019-10-08 MED ORDER — LIDOCAINE VISCOUS HCL 2 % MT SOLN
15.0000 mL | Freq: Once | OROMUCOSAL | Status: AC
  Administered 2019-10-08: 15 mL via ORAL
  Filled 2019-10-08: qty 15

## 2019-10-08 MED ORDER — FUROSEMIDE 10 MG/ML IJ SOLN
40.0000 mg | Freq: Once | INTRAMUSCULAR | Status: AC
  Administered 2019-10-08: 40 mg via INTRAVENOUS
  Filled 2019-10-08: qty 4

## 2019-10-08 MED ORDER — ASPIRIN 81 MG PO CHEW
324.0000 mg | CHEWABLE_TABLET | Freq: Once | ORAL | Status: AC
  Administered 2019-10-08: 324 mg via ORAL
  Filled 2019-10-08: qty 4

## 2019-10-08 MED ORDER — POTASSIUM CHLORIDE CRYS ER 20 MEQ PO TBCR
40.0000 meq | EXTENDED_RELEASE_TABLET | Freq: Once | ORAL | Status: AC
  Administered 2019-10-08: 40 meq via ORAL
  Filled 2019-10-08: qty 2

## 2019-10-08 MED ORDER — FUROSEMIDE 10 MG/ML IJ SOLN
80.0000 mg | Freq: Once | INTRAMUSCULAR | Status: AC
  Administered 2019-10-08: 80 mg via INTRAVENOUS
  Filled 2019-10-08: qty 8
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
## 2019-10-08 NOTE — Telephone Encounter (Signed)
CSW consulted to add pt to paramedicine program.  Pt lives in [place], [place]) but paramedic approved to go see pt this week to confirm pt medication regimen.  CSW will place referral to [facility] to help pt with more chronic management at home.  CSW called pt and informed of this plan- pt is in agreement  Will continue to follow and assist as needed  Jorge Ny, [facility] Desk#: [phone] Cell#: [phone]
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
## 2019-10-08 NOTE — Discharge Instructions (Addendum)
At this time there does not appear to be the presence of an emergent medical condition, however there is always the potential for conditions to change. Please read and follow the below instructions.  Please return to the Emergency Department immediately for any new or worsening symptoms or if your symptoms do not improve within 2 days. Please be sure to follow up with your Primary Care Provider within one week regarding your visit today; please call their office to schedule an appointment even if you are feeling better for a follow-up visit. Go to the heart failure clinic on August 5 at 11 AM for your appointment.  Continue taking all the medications prescribed to you today by the cardiologist.  Your spironolactone has been increased from 12.5 mg a day to 25 mg a day.  You have been started on a new medication called Lasix, please 1 pill (40mg ) daily. Take the medication Protonix as prescribed to help with symptoms which may be related to acid reflux.  Get help right away if: You have a cough that gets worse, or you cough up blood. You have very bad (severe) pain in your belly (abdomen). You pass out (faint). You have either of these for no clear reason: Sudden chest discomfort. Sudden discomfort in your arms, back, neck, or jaw. You have shortness of breath at any time. You suddenly start to sweat, or your skin gets clammy. You feel sick to your stomach (nauseous). You throw up (vomit). You suddenly feel lightheaded or dizzy. You feel very weak or tired. Your heart starts to beat fast, or it feels like it is skipping beats. You have fever or chills. You have any new/concerning or worsening of symptoms These symptoms may be an emergency. Do not wait to see if the symptoms will go away. Get medical help right away. Call your local emergency services (911 in the [place] Do not drive yourself to the hospital.      Please read the additional information packets attached to your discharge  summary.  Do not take your medicine if  develop an itchy rash, swelling in your mouth or lips, or difficulty breathing; call 911 and seek immediate emergency medical attention if this occurs.  You may review your lab tests and imaging results in their entirety on your MyChart account.  Please discuss all results of fully with your primary care provider and other specialist at your follow-up visit.  Note: Portions of this text may have been transcribed using voice recognition software. Every effort was made to ensure accuracy; however, inadvertent computerized transcription errors may still be present.

## 2019-10-08 NOTE — ED Triage Notes (Signed)
Pt is here with history of chf and reports chest pain, sob, and foot swelling.  Pt placed on 02/2L for feeling of short of breath and respirations 24 at triage.  Pt took fluid pill this am.

## 2019-10-09 ENCOUNTER — Other Ambulatory Visit (HOSPITAL_COMMUNITY): Payer: Self-pay

## 2019-10-09 NOTE — Progress Notes (Signed)
Paramedicine Encounter    Patient ID: Ryan [name], male    DOB: 12/30/1950, 69 y.o.   MRN: [id number]   Patient Care Team: [facility] [place] as PCP - General Bensimhon, Shaune Pascal, MD as PCP - Advanced Heart Failure (Cardiology) [name], MD as Referring Physician (Internal Medicine)  Patient Active Problem List   Diagnosis Date Noted   Hypophosphatemia 28/41/3244   Acute alcoholic pancreatitis 03/15/7251   Acute on chronic congestive heart failure ([place])    Acute decompensated heart failure ([place]) 10/15/2018   Bleeding hemorrhoids 03/07/2018   Adenomatous polyp of descending colon    GI bleed 03/05/2018   Hx of chronic pancreatitis    Nonischemic cardiomyopathy (HCC)    Duodenal ulcer    Hematochezia    Chronic combined systolic and diastolic CHF (congestive heart failure) ([place]) 07/13/2016   Alcohol abuse    Alcohol-induced acute pancreatitis without infection or necrosis    AKI (acute kidney injury) ([place]) 11/27/2015   Polysubstance abuse ([place]) 11/27/2015   Hypokalemia 11/27/2015   COPD (chronic obstructive pulmonary disease) ([place]) 12/19/2013   CKD (chronic kidney disease), stage III ([place]) 12/03/2013   Post-traumatic osteoarthritis of left hip 11/26/2013   Post-traumatic osteoarthritis of left knee 11/26/2013   Hyperlipidemia 11/26/2013   Glaucoma 04/23/2013   Low back pain 08/29/2010   Tobacco use disorder 07/08/2010   Essential hypertension 05/16/2010   DYSPNEA 05/16/2010    Current Outpatient Medications:    acetaminophen (TYLENOL) 325 MG tablet, Take 650 mg by mouth every 6 (six) hours as needed for mild pain., Disp: , Rfl:    albuterol (PROAIR HFA) 108 (90 Base) MCG/ACT inhaler, Inhale 2 puffs into the lungs every 6 (six) hours as needed for wheezing or shortness of breath., Disp: , Rfl:    diclofenac sodium (VOLTAREN) 1 % GEL, Apply 1 application topically 2 (two) times daily as needed (to painful sites). , Disp: , Rfl:     furosemide (LASIX) 40 MG tablet, Take 1 tablet (40 mg total) by mouth daily., Disp: 30 tablet, Rfl: 0   gabapentin (NEURONTIN) 300 MG capsule, Take 300 mg by mouth 2 (two) times daily as needed (for nerve pain). , Disp: , Rfl:    ivabradine (CORLANOR) 5 MG TABS tablet, Take 1 tablet (5 mg total) by mouth 2 (two) times daily with a meal., Disp: 60 tablet, Rfl: 6   loratadine (CLARITIN) 10 MG tablet, Take 10 mg by mouth at bedtime., Disp: , Rfl:    pantoprazole (PROTONIX) 40 MG tablet, Take 1 tablet (40 mg total) by mouth daily., Disp: 30 tablet, Rfl: 0   spironolactone (ALDACTONE) 25 MG tablet, Take 1 tablet (25 mg total) by mouth daily., Disp: 30 tablet, Rfl: 0   aspirin EC 81 MG tablet, Take 81 mg by mouth daily., Disp: , Rfl:    psyllium (HYDROCIL/METAMUCIL) 95 % PACK, Take 1 packet by mouth daily., Disp: 240 each, Rfl: 0   sacubitril-valsartan (ENTRESTO) 24-26 MG, Take 1 tablet by mouth 2 (two) times daily. (Patient not taking: Reported on 10/09/2019), Disp: 60 tablet, Rfl: 0   traMADol (ULTRAM) 50 MG tablet, Take 1 tablet by mouth in the morning, at noon, and at bedtime. , Disp: , Rfl:  Allergies  Allergen Reactions   Tape Other (See Comments)    "TAPE TEARS OFF MY SKIN"      Social History   Socioeconomic History   Marital status: Single    Spouse name: Not on file   Number of  children: Not on file   Years of education: Not on file   Highest education level: Not on file  Occupational History   Not on file  Tobacco Use   Smoking status: Current Every Day Smoker    Years: 39.00    Types: Cigarettes   Smokeless tobacco: Never Used   Tobacco comment: smokes 2-4 cigarettes a day  Vaping Use   Vaping Use: Never used  Substance and Sexual Activity   Alcohol use: Yes    Comment: Drinks socially on the weekends but used to drink heavily.    Drug use: No    Types: Cocaine, Marijuana    Comment: Reports he has not used cocaine or Maijuana in awhile   Sexual  activity: Yes  Other Topics Concern   Not on file  Social History Narrative   Lives in [place] with his Sister. Disabled.    Social Determinants of Health   Financial Resource Strain:    Difficulty of Paying Living Expenses:   Food Insecurity:    Worried About Charity fundraiser in the Last Year:    Arboriculturist in the Last Year:   Transportation Needs:    Film/video editor (Medical):    Lack of Transportation (Non-Medical):   Physical Activity:    Days of Exercise per Week:    Minutes of Exercise per Session:   Stress:    Feeling of Stress :   Social Connections:    Frequency of Communication with Friends and Family:    Frequency of Social Gatherings with Friends and Family:    Attends Religious Services:    Active Member of Clubs or Organizations:    Attends Archivist Meetings:    Marital Status:   Intimate Partner Violence:    Fear of Current or Ex-Partner:    Emotionally Abused:    Physically Abused:    Sexually Abused:     Physical Exam Cardiovascular:     Rate and Rhythm: Normal rate and regular rhythm.     Pulses: Normal pulses.  Pulmonary:     Effort: Pulmonary effort is normal.     Breath sounds: Normal breath sounds.  Musculoskeletal:        General: Normal range of motion.     Right lower leg: No edema.     Left lower leg: No edema.  Skin:    General: Skin is warm and dry.     Capillary Refill: Capillary refill takes less than 2 seconds.  Neurological:     Mental Status: He is alert and oriented to person, place, and time.         Future Appointments  Date Time Provider [place]  10/16/2019 11:00 AM [facility] PA/NP [facility] None  11/10/2019 10:20 AM Bensimhon, Shaune Pascal, MD [facility] None    BP (!) 138/92 (BP Location: Left Arm, Patient Position: Sitting, Cuff Size: Normal)    Pulse 94    Resp 16    Wt (!) 204 lb (92.5 kg)    SpO2 97%    BMI 28.45 kg/m   Weight yesterday- 203.8 lb Last visit weight- n/a    Mr Pusey was seen at home today and reported feeling generally well. Today was our first meeting with Mr Soffer and was carried out with haste at the behest of Dr Aundra Dubin to investigate any discrepancies in his medications. Upon looking at his medications the only issue I noted was that he did not have Entresto and reported he  had been out for "a couple of weeks." he said when he ran out he was going to get it filled locally but it was too expensive and he had not gotten a chance to get to the [place] yet to have them fill it. I contacted the HF clinic and arranged to pick up samples tomorrow and deliver them. He stated he would be able to get the prescription filled on August 5th so one sample bottle should suffice. Nothing further was necessary at this time. I will follow up tomorrow.   Jacquiline Doe, EMT 10/09/19  ACTION: Home visit completed Next visit planned for tomorrow

## 2019-10-10 ENCOUNTER — Other Ambulatory Visit (HOSPITAL_COMMUNITY): Payer: Self-pay

## 2019-10-10 NOTE — Progress Notes (Signed)
Mr Duarte was seen at home briefly today. He reported feeling well, denying chest pain, SOB, headache, dizziness, orthopnea, fever or cough. He stated he had taken all of his medications this morning except Entresto which he was out of. I delivered him a sample bottle of Entresto which will last him until he gets to the [place] next week to pick up a refill. I ex[pressed concern over why he had not gotten to pick it up before now, asking if he had transportation issues but he stated he has reliable transportation and did not have a reason for not getting it yet. He stated he would work to ensure he does not run out moving forward but did not want to enroll in mail order from the [place] because "they take too long." I relayed this information to the HF clinic and advised that I do not think this patient needs paramedicine moving forward but would be happy to accept any re-referrals for him. Patient is discharged from paramedicine.   Jacquiline Doe, EMT 10/10/19

## 2019-10-16 ENCOUNTER — Encounter (HOSPITAL_COMMUNITY): Payer: No Typology Code available for payment source
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
## 2019-11-10 ENCOUNTER — Inpatient Hospital Stay (HOSPITAL_BASED_OUTPATIENT_CLINIC_OR_DEPARTMENT_OTHER)
Admission: RE | Admit: 2019-11-10 | Discharge: 2019-11-10 | Disposition: A | Discharge status: Home / Self Care | Payer: No Typology Code available for payment source | Source: Ambulatory Visit | Attending: Internal Medicine | Admitting: Internal Medicine

## 2019-11-10 DIAGNOSIS — I5022 Chronic systolic (congestive) heart failure: Secondary | ICD-10-CM

## 2020-06-11 ENCOUNTER — Telehealth (HOSPITAL_COMMUNITY): Payer: Self-pay | Admitting: *Deleted

## 2020-06-11 NOTE — Telephone Encounter (Signed)
cmri auth no pre cert reqd.

## 2020-08-31 ENCOUNTER — Encounter: Payer: Self-pay | Admitting: Gastroenterology

## 2020-09-29 ENCOUNTER — Ambulatory Visit: Payer: No Typology Code available for payment source | Admitting: Gastroenterology

## 2020-10-06 ENCOUNTER — Telehealth (HOSPITAL_COMMUNITY): Payer: Self-pay | Admitting: Cardiology

## 2020-10-06 NOTE — Telephone Encounter (Signed)
Medical clearance completed by Dr Aundra Dubin in Dr Gillermina Hu absence. Patient is approved to proceed with vitrectomy,endo laser R eye procedure under local anesthesia with light sedation    from a cardiac standpoint. Medication prep-none (patient is NOT on anticoagulation)   Faxed to [facility] for [place]

## 2020-10-07 ENCOUNTER — Telehealth (HOSPITAL_COMMUNITY): Payer: Self-pay | Admitting: *Deleted

## 2020-10-07 NOTE — Telephone Encounter (Signed)
Last office note faxed to [phone] as requested.

## 2020-10-24 ENCOUNTER — Emergency Department (HOSPITAL_COMMUNITY): Payer: No Typology Code available for payment source

## 2020-10-24 ENCOUNTER — Other Ambulatory Visit: Payer: Self-pay

## 2020-10-24 ENCOUNTER — Observation Stay (HOSPITAL_COMMUNITY): Payer: No Typology Code available for payment source

## 2020-10-24 ENCOUNTER — Encounter (HOSPITAL_COMMUNITY): Payer: Self-pay | Admitting: *Deleted

## 2020-10-24 ENCOUNTER — Inpatient Hospital Stay (HOSPITAL_COMMUNITY)
Admission: EM | Admit: 2020-10-24 | Discharge: 2020-10-25 | DRG: 439 | Discharge status: Against Medical Advice | Payer: No Typology Code available for payment source | Attending: Student in an Organized Health Care Education/Training Program | Admitting: Student in an Organized Health Care Education/Training Program

## 2020-10-24 DIAGNOSIS — I5022 Chronic systolic (congestive) heart failure: Secondary | ICD-10-CM | POA: Diagnosis present

## 2020-10-24 DIAGNOSIS — N182 Chronic kidney disease, stage 2 (mild): Secondary | ICD-10-CM | POA: Diagnosis present

## 2020-10-24 DIAGNOSIS — N183 Chronic kidney disease, stage 3 unspecified: Secondary | ICD-10-CM | POA: Diagnosis present

## 2020-10-24 DIAGNOSIS — F17210 Nicotine dependence, cigarettes, uncomplicated: Secondary | ICD-10-CM | POA: Diagnosis present

## 2020-10-24 DIAGNOSIS — I5042 Chronic combined systolic (congestive) and diastolic (congestive) heart failure: Secondary | ICD-10-CM | POA: Diagnosis present

## 2020-10-24 DIAGNOSIS — I130 Hypertensive heart and chronic kidney disease with heart failure and stage 1 through stage 4 chronic kidney disease, or unspecified chronic kidney disease: Secondary | ICD-10-CM | POA: Diagnosis present

## 2020-10-24 DIAGNOSIS — H409 Unspecified glaucoma: Secondary | ICD-10-CM | POA: Diagnosis present

## 2020-10-24 DIAGNOSIS — K852 Alcohol induced acute pancreatitis without necrosis or infection: Secondary | ICD-10-CM | POA: Diagnosis not present

## 2020-10-24 DIAGNOSIS — Z20822 Contact with and (suspected) exposure to covid-19: Secondary | ICD-10-CM | POA: Diagnosis present

## 2020-10-24 DIAGNOSIS — R079 Chest pain, unspecified: Secondary | ICD-10-CM | POA: Diagnosis not present

## 2020-10-24 DIAGNOSIS — I700 Atherosclerosis of aorta: Secondary | ICD-10-CM | POA: Diagnosis present

## 2020-10-24 DIAGNOSIS — K859 Acute pancreatitis without necrosis or infection, unspecified: Secondary | ICD-10-CM | POA: Diagnosis present

## 2020-10-24 DIAGNOSIS — Z823 Family history of stroke: Secondary | ICD-10-CM

## 2020-10-24 DIAGNOSIS — Z806 Family history of leukemia: Secondary | ICD-10-CM

## 2020-10-24 DIAGNOSIS — Z8249 Family history of ischemic heart disease and other diseases of the circulatory system: Secondary | ICD-10-CM

## 2020-10-24 DIAGNOSIS — K5730 Diverticulosis of large intestine without perforation or abscess without bleeding: Secondary | ICD-10-CM | POA: Diagnosis present

## 2020-10-24 DIAGNOSIS — N1831 Chronic kidney disease, stage 3a: Secondary | ICD-10-CM | POA: Diagnosis present

## 2020-10-24 DIAGNOSIS — Z8701 Personal history of pneumonia (recurrent): Secondary | ICD-10-CM

## 2020-10-24 DIAGNOSIS — F1020 Alcohol dependence, uncomplicated: Secondary | ICD-10-CM | POA: Diagnosis present

## 2020-10-24 DIAGNOSIS — F1010 Alcohol abuse, uncomplicated: Secondary | ICD-10-CM | POA: Diagnosis present

## 2020-10-24 DIAGNOSIS — Z5329 Procedure and treatment not carried out because of patient's decision for other reasons: Secondary | ICD-10-CM | POA: Diagnosis present

## 2020-10-24 DIAGNOSIS — M545 Low back pain, unspecified: Secondary | ICD-10-CM | POA: Diagnosis present

## 2020-10-24 DIAGNOSIS — G8929 Other chronic pain: Secondary | ICD-10-CM | POA: Diagnosis present

## 2020-10-24 DIAGNOSIS — Z833 Family history of diabetes mellitus: Secondary | ICD-10-CM

## 2020-10-24 DIAGNOSIS — I420 Dilated cardiomyopathy: Secondary | ICD-10-CM | POA: Diagnosis present

## 2020-10-24 DIAGNOSIS — K861 Other chronic pancreatitis: Secondary | ICD-10-CM | POA: Diagnosis present

## 2020-10-24 DIAGNOSIS — J449 Chronic obstructive pulmonary disease, unspecified: Secondary | ICD-10-CM | POA: Diagnosis present

## 2020-10-24 DIAGNOSIS — K219 Gastro-esophageal reflux disease without esophagitis: Secondary | ICD-10-CM | POA: Diagnosis present

## 2020-10-24 LAB — COMPREHENSIVE METABOLIC PANEL
ALT: 17 U/L (ref 0–44)
AST: 15 U/L (ref 15–41)
Albumin: 3 g/dL — ABNORMAL LOW (ref 3.5–5.0)
Alkaline Phosphatase: 120 U/L (ref 38–126)
Anion gap: 9 (ref 5–15)
BUN: 9 mg/dL (ref 8–23)
CO2: 28 mmol/L (ref 22–32)
Calcium: 8.8 mg/dL — ABNORMAL LOW (ref 8.9–10.3)
Chloride: 99 mmol/L (ref 98–111)
Creatinine, Ser: 1.04 mg/dL (ref 0.61–1.24)
GFR, Estimated: >60 mL/min (ref >60)
Glucose, Bld: 112 mg/dL — ABNORMAL HIGH (ref 70–99)
Potassium: 3.5 mmol/L (ref 3.5–5.1)
Sodium: 136 mmol/L (ref 135–145)
Total Bilirubin: 0.9 mg/dL (ref 0.3–1.2)
Total Protein: 7.1 g/dL (ref 6.5–8.1)

## 2020-10-24 LAB — URINALYSIS, ROUTINE W REFLEX MICROSCOPIC
Bilirubin Urine: NEGATIVE
Glucose, UA: NEGATIVE mg/dL
Ketones, ur: 20 mg/dL — AB
Nitrite: POSITIVE — AB
Protein, ur: 30 mg/dL — AB
Specific Gravity, Urine: >1.046 — ABNORMAL HIGH (ref 1.005–1.030)
WBC, UA: >50 WBC/hpf — ABNORMAL HIGH (ref 0–5)
pH: 5 (ref 5.0–8.0)

## 2020-10-24 LAB — CBC
HCT: 44.9 % (ref 39.0–52.0)
Hemoglobin: 15 g/dL (ref 13.0–17.0)
MCH: 31.9 pg (ref 26.0–34.0)
MCHC: 33.4 g/dL (ref 30.0–36.0)
MCV: 95.5 fL (ref 80.0–100.0)
Platelets: 237 10*3/uL (ref 150–400)
RBC: 4.7 MIL/uL (ref 4.22–5.81)
RDW: 16.4 % — ABNORMAL HIGH (ref 11.5–15.5)
WBC: 14.9 10*3/uL — ABNORMAL HIGH (ref 4.0–10.5)
nRBC: 0 % (ref 0.0–0.2)

## 2020-10-24 LAB — RESP PANEL BY RT-PCR (FLU A&B, COVID) ARPGX2
Influenza A by PCR: NEGATIVE
Influenza B by PCR: NEGATIVE
SARS Coronavirus 2 by RT PCR: NEGATIVE

## 2020-10-24 LAB — TROPONIN I (HIGH SENSITIVITY)
Troponin I (High Sensitivity): 23 ng/L — ABNORMAL HIGH (ref <18)
Troponin I (High Sensitivity): 12 ng/L (ref <18)

## 2020-10-24 LAB — BRAIN NATRIURETIC PEPTIDE: B Natriuretic Peptide: 341.7 pg/mL — ABNORMAL HIGH (ref 0.0–100.0)

## 2020-10-24 LAB — LIPASE, BLOOD: Lipase: 46 U/L (ref 11–51)

## 2020-10-24 MED ORDER — SODIUM CHLORIDE 0.9% FLUSH
3.0000 mL | Freq: Two times a day (BID) | INTRAVENOUS | Status: DC
  Administered 2020-10-24 – 2020-10-25 (×3): 3 mL via INTRAVENOUS

## 2020-10-24 MED ORDER — ONDANSETRON HCL 4 MG PO TABS: 4.0000 mg | ORAL_TABLET | Freq: Four times a day (QID) | ORAL | Status: DC | PRN

## 2020-10-24 MED ORDER — HYDROMORPHONE HCL 1 MG/ML IJ SOLN
1.0000 mg | INTRAMUSCULAR | Status: DC | PRN
  Administered 2020-10-24 – 2020-10-25 (×3): 1 mg via INTRAVENOUS
  Filled 2020-10-24 (×3): qty 1

## 2020-10-24 MED ORDER — ONDANSETRON HCL 4 MG/2ML IJ SOLN
4.0000 mg | Freq: Once | INTRAMUSCULAR | Status: AC
  Administered 2020-10-24: 4 mg via INTRAVENOUS
  Filled 2020-10-24: qty 2

## 2020-10-24 MED ORDER — IOHEXOL 350 MG/ML SOLN
80.0000 mL | Freq: Once | INTRAVENOUS | Status: AC | PRN
  Administered 2020-10-24: 80 mL via INTRAVENOUS

## 2020-10-24 MED ORDER — LACTATED RINGERS IV SOLN: INTRAVENOUS | Status: AC

## 2020-10-24 MED ORDER — TRAMADOL HCL 50 MG PO TABS: 50.0000 mg | ORAL_TABLET | Freq: Once | ORAL | Status: DC

## 2020-10-24 MED ORDER — MORPHINE SULFATE (PF) 4 MG/ML IV SOLN
4.0000 mg | Freq: Once | INTRAVENOUS | Status: AC
  Administered 2020-10-24: 4 mg via INTRAVENOUS
  Filled 2020-10-24: qty 1

## 2020-10-24 MED ORDER — ONDANSETRON HCL 4 MG/2ML IJ SOLN: 4.0000 mg | Freq: Four times a day (QID) | INTRAMUSCULAR | Status: DC | PRN

## 2020-10-24 MED ORDER — ENOXAPARIN SODIUM 40 MG/0.4ML IJ SOSY
40.0000 mg | PREFILLED_SYRINGE | INTRAMUSCULAR | Status: DC
  Filled 2020-10-24: qty 0.4

## 2020-10-24 NOTE — ED Notes (Signed)
Patient transported to CT 
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
## 2020-10-24 NOTE — ED Triage Notes (Signed)
Pt c/o abd and chest pain for 3days, hx of pancreatitis. Denies NV
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
## 2020-10-25 DIAGNOSIS — Z833 Family history of diabetes mellitus: Secondary | ICD-10-CM | POA: Diagnosis not present

## 2020-10-25 DIAGNOSIS — Z8701 Personal history of pneumonia (recurrent): Secondary | ICD-10-CM | POA: Diagnosis not present

## 2020-10-25 DIAGNOSIS — F17210 Nicotine dependence, cigarettes, uncomplicated: Secondary | ICD-10-CM | POA: Diagnosis present

## 2020-10-25 DIAGNOSIS — K852 Alcohol induced acute pancreatitis without necrosis or infection: Principal | ICD-10-CM

## 2020-10-25 DIAGNOSIS — Z806 Family history of leukemia: Secondary | ICD-10-CM | POA: Diagnosis not present

## 2020-10-25 DIAGNOSIS — R079 Chest pain, unspecified: Secondary | ICD-10-CM | POA: Diagnosis present

## 2020-10-25 DIAGNOSIS — Z5329 Procedure and treatment not carried out because of patient's decision for other reasons: Secondary | ICD-10-CM | POA: Diagnosis present

## 2020-10-25 DIAGNOSIS — K861 Other chronic pancreatitis: Secondary | ICD-10-CM | POA: Diagnosis present

## 2020-10-25 DIAGNOSIS — K859 Acute pancreatitis without necrosis or infection, unspecified: Secondary | ICD-10-CM | POA: Diagnosis present

## 2020-10-25 DIAGNOSIS — I130 Hypertensive heart and chronic kidney disease with heart failure and stage 1 through stage 4 chronic kidney disease, or unspecified chronic kidney disease: Secondary | ICD-10-CM | POA: Diagnosis present

## 2020-10-25 DIAGNOSIS — H409 Unspecified glaucoma: Secondary | ICD-10-CM | POA: Diagnosis present

## 2020-10-25 DIAGNOSIS — J449 Chronic obstructive pulmonary disease, unspecified: Secondary | ICD-10-CM | POA: Diagnosis present

## 2020-10-25 DIAGNOSIS — I700 Atherosclerosis of aorta: Secondary | ICD-10-CM | POA: Diagnosis present

## 2020-10-25 DIAGNOSIS — Z8249 Family history of ischemic heart disease and other diseases of the circulatory system: Secondary | ICD-10-CM | POA: Diagnosis not present

## 2020-10-25 DIAGNOSIS — N1831 Chronic kidney disease, stage 3a: Secondary | ICD-10-CM | POA: Diagnosis present

## 2020-10-25 DIAGNOSIS — G8929 Other chronic pain: Secondary | ICD-10-CM | POA: Diagnosis present

## 2020-10-25 DIAGNOSIS — Z20822 Contact with and (suspected) exposure to covid-19: Secondary | ICD-10-CM | POA: Diagnosis present

## 2020-10-25 DIAGNOSIS — K219 Gastro-esophageal reflux disease without esophagitis: Secondary | ICD-10-CM | POA: Diagnosis present

## 2020-10-25 DIAGNOSIS — K5730 Diverticulosis of large intestine without perforation or abscess without bleeding: Secondary | ICD-10-CM | POA: Diagnosis present

## 2020-10-25 DIAGNOSIS — I5042 Chronic combined systolic (congestive) and diastolic (congestive) heart failure: Secondary | ICD-10-CM | POA: Diagnosis present

## 2020-10-25 DIAGNOSIS — M545 Low back pain, unspecified: Secondary | ICD-10-CM | POA: Diagnosis present

## 2020-10-25 DIAGNOSIS — F1010 Alcohol abuse, uncomplicated: Secondary | ICD-10-CM | POA: Diagnosis present

## 2020-10-25 DIAGNOSIS — I420 Dilated cardiomyopathy: Secondary | ICD-10-CM | POA: Diagnosis present

## 2020-10-25 DIAGNOSIS — Z823 Family history of stroke: Secondary | ICD-10-CM | POA: Diagnosis not present

## 2020-10-25 LAB — COMPREHENSIVE METABOLIC PANEL
ALT: 12 U/L (ref 0–44)
AST: 15 U/L (ref 15–41)
Albumin: 2.9 g/dL — ABNORMAL LOW (ref 3.5–5.0)
Alkaline Phosphatase: 121 U/L (ref 38–126)
Anion gap: 8 (ref 5–15)
BUN: 9 mg/dL (ref 8–23)
CO2: 29 mmol/L (ref 22–32)
Calcium: 8.8 mg/dL — ABNORMAL LOW (ref 8.9–10.3)
Chloride: 99 mmol/L (ref 98–111)
Creatinine, Ser: 0.94 mg/dL (ref 0.61–1.24)
GFR, Estimated: >60 mL/min (ref >60)
Glucose, Bld: 120 mg/dL — ABNORMAL HIGH (ref 70–99)
Potassium: 3.6 mmol/L (ref 3.5–5.1)
Sodium: 136 mmol/L (ref 135–145)
Total Bilirubin: 0.8 mg/dL (ref 0.3–1.2)
Total Protein: 6.8 g/dL (ref 6.5–8.1)

## 2020-10-25 LAB — HEMOGLOBIN A1C
Hgb A1c MFr Bld: 5.5 % (ref 4.8–5.6)
Mean Plasma Glucose: 111.15 mg/dL

## 2020-10-25 LAB — CBC
HCT: 42.7 % (ref 39.0–52.0)
Hemoglobin: 14.1 g/dL (ref 13.0–17.0)
MCH: 31.7 pg (ref 26.0–34.0)
MCHC: 33 g/dL (ref 30.0–36.0)
MCV: 96 fL (ref 80.0–100.0)
Platelets: 229 10*3/uL (ref 150–400)
RBC: 4.45 MIL/uL (ref 4.22–5.81)
RDW: 16 % — ABNORMAL HIGH (ref 11.5–15.5)
WBC: 11.7 10*3/uL — ABNORMAL HIGH (ref 4.0–10.5)
nRBC: 0 % (ref 0.0–0.2)

## 2020-10-25 LAB — LIPID PANEL
Cholesterol: 139 mg/dL (ref 0–200)
HDL: 31 mg/dL — ABNORMAL LOW (ref >40)
LDL Cholesterol: 91 mg/dL (ref 0–99)
Total CHOL/HDL Ratio: 4.5 RATIO
Triglycerides: 85 mg/dL (ref <150)
VLDL: 17 mg/dL (ref 0–40)

## 2020-10-25 MED ORDER — HYDROMORPHONE HCL 1 MG/ML IJ SOLN: 0.5000 mg | INTRAMUSCULAR | Status: DC | PRN
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
## 2020-10-25 NOTE — Discharge Instructions (Signed)
Hi [name], Ryan [name] were recently hospitalized at [facility] for an inflammation of your pancreas. As you know, this can cause severe abdominal pain, nausea and vomiting. We think that you have some inflammation of your pancreas at your baseline. We think that in order to prevent future events of pancreatitis, we advise you to watch your alcohol intake and smoking. These activities put [place] at an increased risk for reoccurrence of pancreatitis. We therefore recommend that you try to lower your intake as you can tolerate. Please follow up with either [place] or your Primary Care Provider, if your symptoms persist, you have worsening of symptoms, start experiencing new fevers or shortness of breath.   We wish you a speedy recovery!

## 2020-10-25 NOTE — ED Notes (Addendum)
Patient was standing at bedside tangled in chords, this tech asked patient to sit down so I could help him get untangled. Patient became irate with this tech and stated that if he needed help he would of asked for it. Patient began yelling at this tech to leave the room. Patient returned to bed and this tech left the room.
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
## 2020-10-25 NOTE — ED Notes (Signed)
Dr. Darrick Meigs at bedside; pt still insistent on leaving; Dr. Darrick Meigs explained risks of leaving AMA and in his condition, including death; pt a and o x 4 verbalizes understanding, refused to sign AMA form; IV removed by Verdie Drown, NT
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
## 2020-10-25 NOTE — ED Notes (Addendum)
Pt called this RN to room, states he wants to leave; pt endorses having a disagreement with tech regarding standing up out of bed, states "I don't need this"; this RN encouraged pt to stay, pt insists on leaving, states, "i'll go to the [place] or my cardiologist, my cardiologist knows all about me"; Dr. Evette Doffing and team notified

## 2020-10-25 NOTE — Plan of Care (Signed)
Paged to bedside by RN at the request of the patient to leave AMA.   Pt seen at beside. He states that he "does not have to put up with this ####". On further questioning, he notes that he had been standing up, trying to untangle his lines, when someone entered and asked him to sit down. He states that he can do it himself and didn't need help. Nursing staff has offered to avoid further interactions with this individual however pt declines to stay in the hospital.   I explained that we, as his medical team advise against him leaving at this time, until further evaluation and monitoring can take place. Risks of leaving AMA were reviewed and Takeru expressed understanding. He expresses understanding of his current medical condition and of the medical provider's recommendations.   He is encouraged to return for ongoing evaluation and monitoring, should he choose to do so.  Pt walked out of the room, stating that his son was there to pick him up.   Mitzi Hansen, MD Internal Medicine Resident PGY-3 [facility] Pager: [phone] 10/25/2020 3:01 PM

## 2020-10-26 ENCOUNTER — Emergency Department (HOSPITAL_COMMUNITY): Payer: No Typology Code available for payment source

## 2020-10-26 ENCOUNTER — Inpatient Hospital Stay (HOSPITAL_COMMUNITY)
Admission: EM | Admit: 2020-10-26 | Discharge: 2020-10-29 | DRG: 439 | Disposition: A | Discharge status: Home / Self Care | Payer: No Typology Code available for payment source | Attending: Internal Medicine | Admitting: Internal Medicine

## 2020-10-26 ENCOUNTER — Encounter (HOSPITAL_COMMUNITY): Payer: Self-pay | Admitting: Emergency Medicine

## 2020-10-26 ENCOUNTER — Other Ambulatory Visit: Payer: Self-pay

## 2020-10-26 DIAGNOSIS — K219 Gastro-esophageal reflux disease without esophagitis: Secondary | ICD-10-CM | POA: Diagnosis present

## 2020-10-26 DIAGNOSIS — Z91048 Other nonmedicinal substance allergy status: Secondary | ICD-10-CM | POA: Diagnosis not present

## 2020-10-26 DIAGNOSIS — Z79899 Other long term (current) drug therapy: Secondary | ICD-10-CM

## 2020-10-26 DIAGNOSIS — J449 Chronic obstructive pulmonary disease, unspecified: Secondary | ICD-10-CM | POA: Diagnosis present

## 2020-10-26 DIAGNOSIS — G8929 Other chronic pain: Secondary | ICD-10-CM | POA: Diagnosis present

## 2020-10-26 DIAGNOSIS — F32A Depression, unspecified: Secondary | ICD-10-CM | POA: Diagnosis present

## 2020-10-26 DIAGNOSIS — N182 Chronic kidney disease, stage 2 (mild): Secondary | ICD-10-CM | POA: Diagnosis present

## 2020-10-26 DIAGNOSIS — F17210 Nicotine dependence, cigarettes, uncomplicated: Secondary | ICD-10-CM | POA: Diagnosis present

## 2020-10-26 DIAGNOSIS — K859 Acute pancreatitis without necrosis or infection, unspecified: Secondary | ICD-10-CM | POA: Diagnosis present

## 2020-10-26 DIAGNOSIS — E876 Hypokalemia: Secondary | ICD-10-CM | POA: Diagnosis present

## 2020-10-26 DIAGNOSIS — Z20822 Contact with and (suspected) exposure to covid-19: Secondary | ICD-10-CM | POA: Diagnosis present

## 2020-10-26 DIAGNOSIS — K852 Alcohol induced acute pancreatitis without necrosis or infection: Principal | ICD-10-CM | POA: Diagnosis present

## 2020-10-26 DIAGNOSIS — M1712 Unilateral primary osteoarthritis, left knee: Secondary | ICD-10-CM | POA: Diagnosis present

## 2020-10-26 DIAGNOSIS — Z7952 Long term (current) use of systemic steroids: Secondary | ICD-10-CM | POA: Diagnosis not present

## 2020-10-26 DIAGNOSIS — E785 Hyperlipidemia, unspecified: Secondary | ICD-10-CM | POA: Diagnosis present

## 2020-10-26 DIAGNOSIS — I5042 Chronic combined systolic (congestive) and diastolic (congestive) heart failure: Secondary | ICD-10-CM | POA: Diagnosis present

## 2020-10-26 DIAGNOSIS — Z7141 Alcohol abuse counseling and surveillance of alcoholic: Secondary | ICD-10-CM

## 2020-10-26 DIAGNOSIS — F1010 Alcohol abuse, uncomplicated: Secondary | ICD-10-CM | POA: Diagnosis present

## 2020-10-26 DIAGNOSIS — I130 Hypertensive heart and chronic kidney disease with heart failure and stage 1 through stage 4 chronic kidney disease, or unspecified chronic kidney disease: Secondary | ICD-10-CM | POA: Diagnosis present

## 2020-10-26 DIAGNOSIS — Z79891 Long term (current) use of opiate analgesic: Secondary | ICD-10-CM | POA: Diagnosis not present

## 2020-10-26 DIAGNOSIS — Z7982 Long term (current) use of aspirin: Secondary | ICD-10-CM | POA: Diagnosis not present

## 2020-10-26 DIAGNOSIS — I428 Other cardiomyopathies: Secondary | ICD-10-CM | POA: Diagnosis present

## 2020-10-26 DIAGNOSIS — K860 Alcohol-induced chronic pancreatitis: Secondary | ICD-10-CM | POA: Diagnosis present

## 2020-10-26 DIAGNOSIS — J410 Simple chronic bronchitis: Secondary | ICD-10-CM

## 2020-10-26 LAB — RESP PANEL BY RT-PCR (FLU A&B, COVID) ARPGX2
Influenza A by PCR: NEGATIVE
Influenza B by PCR: NEGATIVE
SARS Coronavirus 2 by RT PCR: NEGATIVE

## 2020-10-26 LAB — ETHANOL: Alcohol, Ethyl (B): <10 mg/dL (ref <10)

## 2020-10-26 LAB — COMPREHENSIVE METABOLIC PANEL
ALT: 16 U/L (ref 0–44)
AST: 24 U/L (ref 15–41)
Albumin: 3.5 g/dL (ref 3.5–5.0)
Alkaline Phosphatase: 129 U/L — ABNORMAL HIGH (ref 38–126)
Anion gap: 8 (ref 5–15)
BUN: 10 mg/dL (ref 8–23)
CO2: 28 mmol/L (ref 22–32)
Calcium: 9.1 mg/dL (ref 8.9–10.3)
Chloride: 100 mmol/L (ref 98–111)
Creatinine, Ser: 1.03 mg/dL (ref 0.61–1.24)
GFR, Estimated: >60 mL/min (ref >60)
Glucose, Bld: 98 mg/dL (ref 70–99)
Potassium: 3 mmol/L — ABNORMAL LOW (ref 3.5–5.1)
Sodium: 136 mmol/L (ref 135–145)
Total Bilirubin: 0.7 mg/dL (ref 0.3–1.2)
Total Protein: 8.2 g/dL — ABNORMAL HIGH (ref 6.5–8.1)

## 2020-10-26 LAB — CBC
HCT: 44.2 % (ref 39.0–52.0)
Hemoglobin: 14.1 g/dL (ref 13.0–17.0)
MCH: 31.8 pg (ref 26.0–34.0)
MCHC: 31.9 g/dL (ref 30.0–36.0)
MCV: 99.8 fL (ref 80.0–100.0)
Platelets: 290 10*3/uL (ref 150–400)
RBC: 4.43 MIL/uL (ref 4.22–5.81)
RDW: 16 % — ABNORMAL HIGH (ref 11.5–15.5)
WBC: 12.4 10*3/uL — ABNORMAL HIGH (ref 4.0–10.5)
nRBC: 0 % (ref 0.0–0.2)

## 2020-10-26 LAB — LIPASE, BLOOD: Lipase: 119 U/L — ABNORMAL HIGH (ref 11–51)

## 2020-10-26 LAB — TRIGLYCERIDES: Triglycerides: 111 mg/dL (ref <150)

## 2020-10-26 LAB — TROPONIN I (HIGH SENSITIVITY)
Troponin I (High Sensitivity): 11 ng/L (ref <18)
Troponin I (High Sensitivity): 9 ng/L (ref <18)

## 2020-10-26 MED ORDER — CYCLOBENZAPRINE HCL 10 MG PO TABS: 10.0000 mg | ORAL_TABLET | Freq: Three times a day (TID) | ORAL | Status: DC | PRN

## 2020-10-26 MED ORDER — SACUBITRIL-VALSARTAN 24-26 MG PO TABS
1.0000 | ORAL_TABLET | Freq: Two times a day (BID) | ORAL | Status: DC
  Administered 2020-10-27 – 2020-10-29 (×4): 1 via ORAL
  Filled 2020-10-26 (×5): qty 1

## 2020-10-26 MED ORDER — ENOXAPARIN SODIUM 40 MG/0.4ML IJ SOSY
40.0000 mg | PREFILLED_SYRINGE | INTRAMUSCULAR | Status: DC
  Filled 2020-10-26 (×3): qty 0.4

## 2020-10-26 MED ORDER — SODIUM CHLORIDE 0.9 % IV SOLN: INTRAVENOUS | Status: AC

## 2020-10-26 MED ORDER — GABAPENTIN 300 MG PO CAPS
300.0000 mg | ORAL_CAPSULE | Freq: Two times a day (BID) | ORAL | Status: DC
  Administered 2020-10-26 – 2020-10-29 (×6): 300 mg via ORAL
  Filled 2020-10-26 (×6): qty 1

## 2020-10-26 MED ORDER — CARVEDILOL 3.125 MG PO TABS
6.2500 mg | ORAL_TABLET | Freq: Two times a day (BID) | ORAL | Status: DC
  Administered 2020-10-27 – 2020-10-29 (×4): 6.25 mg via ORAL
  Filled 2020-10-26 (×5): qty 2

## 2020-10-26 MED ORDER — POTASSIUM CHLORIDE 10 MEQ/100ML IV SOLN
10.0000 meq | INTRAVENOUS | Status: AC
  Administered 2020-10-26 – 2020-10-27 (×3): 10 meq via INTRAVENOUS
  Filled 2020-10-26 (×3): qty 100

## 2020-10-26 MED ORDER — ONDANSETRON HCL 4 MG/2ML IJ SOLN: 4.0000 mg | Freq: Four times a day (QID) | INTRAMUSCULAR | Status: DC | PRN

## 2020-10-26 MED ORDER — ALBUTEROL SULFATE (2.5 MG/3ML) 0.083% IN NEBU: 3.0000 mL | INHALATION_SOLUTION | Freq: Four times a day (QID) | RESPIRATORY_TRACT | Status: DC | PRN

## 2020-10-26 MED ORDER — ACETAMINOPHEN 325 MG PO TABS: 650.0000 mg | ORAL_TABLET | Freq: Four times a day (QID) | ORAL | Status: DC | PRN

## 2020-10-26 MED ORDER — ONDANSETRON HCL 4 MG/2ML IJ SOLN
4.0000 mg | Freq: Once | INTRAMUSCULAR | Status: AC
  Administered 2020-10-26: 4 mg via INTRAVENOUS
  Filled 2020-10-26: qty 2

## 2020-10-26 MED ORDER — HYDROMORPHONE HCL 1 MG/ML IJ SOLN
1.0000 mg | Freq: Once | INTRAMUSCULAR | Status: AC
  Administered 2020-10-26: 1 mg via INTRAVENOUS
  Filled 2020-10-26: qty 1

## 2020-10-26 MED ORDER — HYDROMORPHONE HCL 1 MG/ML IJ SOLN
0.5000 mg | INTRAMUSCULAR | Status: DC | PRN
  Administered 2020-10-26 – 2020-10-28 (×8): 1 mg via INTRAVENOUS
  Filled 2020-10-26 (×9): qty 1

## 2020-10-26 MED ORDER — DIGOXIN 125 MCG PO TABS
0.1250 mg | ORAL_TABLET | Freq: Every day | ORAL | Status: DC
  Administered 2020-10-27 – 2020-10-29 (×3): 0.125 mg via ORAL
  Filled 2020-10-26 (×3): qty 1

## 2020-10-26 MED ORDER — ONDANSETRON HCL 4 MG PO TABS: 4.0000 mg | ORAL_TABLET | Freq: Four times a day (QID) | ORAL | Status: DC | PRN

## 2020-10-26 MED ORDER — ACETAMINOPHEN 650 MG RE SUPP: 650.0000 mg | Freq: Four times a day (QID) | RECTAL | Status: DC | PRN
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
## 2020-10-26 NOTE — ED Notes (Signed)
Ice chips provided for pt after verified with EDP
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
## 2020-10-27 ENCOUNTER — Inpatient Hospital Stay (HOSPITAL_COMMUNITY): Payer: No Typology Code available for payment source

## 2020-10-27 DIAGNOSIS — J410 Simple chronic bronchitis: Secondary | ICD-10-CM | POA: Diagnosis not present

## 2020-10-27 DIAGNOSIS — E876 Hypokalemia: Secondary | ICD-10-CM

## 2020-10-27 DIAGNOSIS — I428 Other cardiomyopathies: Secondary | ICD-10-CM

## 2020-10-27 DIAGNOSIS — I5042 Chronic combined systolic (congestive) and diastolic (congestive) heart failure: Secondary | ICD-10-CM

## 2020-10-27 DIAGNOSIS — K859 Acute pancreatitis without necrosis or infection, unspecified: Secondary | ICD-10-CM

## 2020-10-27 LAB — CBC
HCT: 40.3 % (ref 39.0–52.0)
Hemoglobin: 12.7 g/dL — ABNORMAL LOW (ref 13.0–17.0)
MCH: 31.1 pg (ref 26.0–34.0)
MCHC: 31.5 g/dL (ref 30.0–36.0)
MCV: 98.8 fL (ref 80.0–100.0)
Platelets: 269 10*3/uL (ref 150–400)
RBC: 4.08 MIL/uL — ABNORMAL LOW (ref 4.22–5.81)
RDW: 15.9 % — ABNORMAL HIGH (ref 11.5–15.5)
WBC: 10.9 10*3/uL — ABNORMAL HIGH (ref 4.0–10.5)
nRBC: 0 % (ref 0.0–0.2)

## 2020-10-27 LAB — URINALYSIS, ROUTINE W REFLEX MICROSCOPIC
Bilirubin Urine: NEGATIVE
Glucose, UA: NEGATIVE mg/dL
Ketones, ur: NEGATIVE mg/dL
Nitrite: POSITIVE — AB
Protein, ur: 30 mg/dL — AB
Specific Gravity, Urine: 1.012 (ref 1.005–1.030)
WBC, UA: >50 WBC/hpf — ABNORMAL HIGH (ref 0–5)
pH: 6 (ref 5.0–8.0)

## 2020-10-27 LAB — BASIC METABOLIC PANEL
Anion gap: 8 (ref 5–15)
BUN: 9 mg/dL (ref 8–23)
CO2: 29 mmol/L (ref 22–32)
Calcium: 8.5 mg/dL — ABNORMAL LOW (ref 8.9–10.3)
Chloride: 98 mmol/L (ref 98–111)
Creatinine, Ser: 1 mg/dL (ref 0.61–1.24)
GFR, Estimated: >60 mL/min (ref >60)
Glucose, Bld: 96 mg/dL (ref 70–99)
Potassium: 3.6 mmol/L (ref 3.5–5.1)
Sodium: 135 mmol/L (ref 135–145)

## 2020-10-27 LAB — HEPATIC FUNCTION PANEL
ALT: 12 U/L (ref 0–44)
AST: 20 U/L (ref 15–41)
Albumin: 3 g/dL — ABNORMAL LOW (ref 3.5–5.0)
Alkaline Phosphatase: 98 U/L (ref 38–126)
Bilirubin, Direct: 0.1 mg/dL (ref 0.0–0.2)
Indirect Bilirubin: 0.5 mg/dL (ref 0.3–0.9)
Total Bilirubin: 0.6 mg/dL (ref 0.3–1.2)
Total Protein: 6.8 g/dL (ref 6.5–8.1)

## 2020-10-27 LAB — MAGNESIUM: Magnesium: 2.1 mg/dL (ref 1.7–2.4)

## 2020-10-27 MED ORDER — POLYETHYLENE GLYCOL 3350 17 G PO PACK
17.0000 g | PACK | Freq: Two times a day (BID) | ORAL | Status: DC
  Administered 2020-10-27 – 2020-10-28 (×4): 17 g via ORAL
  Filled 2020-10-27 (×4): qty 1

## 2020-10-27 MED ORDER — BISACODYL 10 MG RE SUPP: 10.0000 mg | Freq: Every day | RECTAL | Status: DC | PRN

## 2020-10-27 MED ORDER — SENNOSIDES-DOCUSATE SODIUM 8.6-50 MG PO TABS
1.0000 | ORAL_TABLET | Freq: Two times a day (BID) | ORAL | Status: DC
  Administered 2020-10-27 – 2020-10-29 (×5): 1 via ORAL
  Filled 2020-10-27 (×5): qty 1

## 2020-10-27 NOTE — TOC Progression Note (Signed)
[place] notification complete. Reference number is [id number].

## 2020-10-27 NOTE — Progress Notes (Signed)
PROGRESS NOTE    Ryan [name]  [id number] DOB: [birth year] DOA: 10/26/2020 PCP: [facility]   Chief Complaint  Patient presents with   Abdominal Pain    Brief admission narrative:  As per H&P written by Dr. Myna Hidalgo on 10/26/2020 Ryan [name] is a 70 y.o. male with medical history significant for nonischemic cardiomyopathy with EF 10 to 15% and 2020, mild renal insufficiency, COPD, and history of drug and alcohol abuse, now presenting to the emergency department for evaluation of worsening abdominal pain and nausea.  Patient has been admitted to [facility] on 10/24/2020 with acute pancreatitis but left yesterday AMA.  Pain has worsened and nausea persisted since he left the he denies any vomiting.  He attempted to drink some ginger ale at home but had significant worsening in his pain.  He acknowledges a history of alcohol abuse but reports that it has been at least a week or so since he has had anything to drink and has not had more than 3 alcoholic beverages in a day for years.  He denies any fevers, chills, chest pain, shortness of breath, cough, or leg swelling.   ED Course: Upon arrival to the ED, patient is found to be afebrile, saturating well on room air, and with systolic blood pressure in the low 100s.  EKG features sinus tachycardia with rate 106 and PVC.  Chest x-ray with no acute findings.  CBC with leukocytosis 12,400.  Chemistry panel notable for potassium 3.0, creatinine 1.03, normal LFTs, and lipase 119.  Patient was treated with Dilaudid and Zofran in the ED.   Assessment & Plan: 1-acute pancreatitis: Alcohol induced. -Cessation counseling provided -Continue as needed analgesics, gentle fluid resuscitation and supportive care -So far has tolerated a clear liquid diet and would like to have his diet further advanced. -Will follow response. -Repeat lipase level in a.m. and follow complete metabolic panel  2-CKD (chronic kidney disease), stage II -Appears to  be stable and at baseline -Continue to follow renal function trend.  3-COPD (chronic obstructive pulmonary disease) (HCC) -No wheezing or requiring oxygen supplementation: -Continue as needed bronchodilators.  4-Hypokalemia -In the setting of GI losses prior to admission and decreased oral intake. -Continue to follow electrolytes and further replete as needed -Telemetry monitoring.  5-alcohol abuse -Patient expressed to be completely alcohol free for the last 2 weeks -No withdrawal symptoms reported.  6-gastroesophageal flux disease -Continue PPI.  7-Chronic combined systolic and diastolic CHF (congestive heart failure) (HCC) -With component of nonischemic cardiomyopathy ([place]) -Stable and compensated -Continue to follow daily weights and strict I's and O's -Low-sodium diet education has been provided.    DVT prophylaxis: Lovenox Code Status: Full code Family Communication: No family at bedside. Disposition:   Status is: Inpatient  Remains inpatient appropriate because:IV treatments appropriate due to intensity of illness or inability to take PO  Dispo: The patient is from: Home              Anticipated d/c is to: Home              Patient currently is not medically stable to d/c.   Difficult to place patient No       Consultants:  None   Procedures:  See below for x-ray reports  Antimicrobials:  None   Subjective: Afebrile, no chest pain, no nausea, no vomiting.  Still reporting intermittent episode of midepigastric discomfort.  Has tolerated clear liquid without problems.  Objective: Vitals:   10/27/20 0404 10/27/20  0500 10/27/20 0855 10/27/20 1205  BP: 96/71  110/75 102/65  Pulse: 100  95 87  Resp: '16  16 17  '$ Temp: 98.7 F (37.1 C)  98 F (36.7 C) 97.7 F (36.5 C)  TempSrc:   Oral Oral  SpO2:   100% 100%  Weight:  81.6 kg    Height:        Intake/Output Summary (Last 24 hours) at 10/27/2020 1759 Last data filed at 10/27/2020 0905 Gross per  24 hour  Intake 236 ml  Output 1 ml  Net 235 ml   Filed Weights   10/26/20 1410 10/26/20 2300 10/27/20 0500  Weight: 85.3 kg 81.6 kg 81.6 kg    Examination:  General exam: Afebrile, no chest pain, no nausea, no vomiting, no shortness of breath.  Still experiencing intermittent episodes of midepigastric discomfort. Respiratory system: Clear to auscultation. Respiratory effort normal.  No using accessory muscle.  Denies shortness of breath and orthopnea.  Good saturation on room air. Cardiovascular system: S1 & S2 heard, no JVD, no rubs, no gallops. Gastrointestinal system: Abdomen is nondistended, soft and with positive bowel sounds.  Tender to palpation midepigastric area appreciated on examination.  No guarding. Central nervous system: Alert and oriented. No focal neurological deficits. Extremities: No cyanosis, no clubbing or edema. Skin: No petechiae. Psychiatry: Judgement and insight appear normal. Mood & affect appropriate.     Data Reviewed: I have personally reviewed following labs and imaging studies  CBC: Recent Labs  Lab 10/24/20 0658 10/25/20 0909 10/26/20 1552 10/27/20 0432  WBC 14.9* 11.7* 12.4* 10.9*  HGB 15.0 14.1 14.1 12.7*  HCT 44.9 42.7 44.2 40.3  MCV 95.5 96.0 99.8 98.8  PLT 237 229 290 [id number]    Basic Metabolic Panel: Recent Labs  Lab 10/24/20 0658 10/25/20 0909 10/26/20 1552 10/27/20 0432  NA 136 136 136 135  K 3.5 3.6 3.0* 3.6  CL 99 99 100 98  CO2 '28 29 28 29  '$ GLUCOSE 112* 120* 98 96  BUN '9 9 10 9  '$ CREATININE 1.04 0.94 1.03 1.00  CALCIUM 8.8* 8.8* 9.1 8.5*  MG  --   --   --  2.1    GFR: Estimated Creatinine Clearance: 72.2 mL/min (by C-G formula based on SCr of 1 mg/dL).  Liver Function Tests: Recent Labs  Lab 10/24/20 0658 10/25/20 0909 10/26/20 1552 10/27/20 0432  AST '15 15 24 20  '$ ALT '17 12 16 12  '$ ALKPHOS 120 121 129* 98  BILITOT 0.9 0.8 0.7 0.6  PROT 7.1 6.8 8.2* 6.8  ALBUMIN 3.0* 2.9* 3.5 3.0*    CBG: No results for  input(s): GLUCAP in the last 168 hours.   Recent Results (from the past 240 hour(s))  Resp Panel by RT-PCR (Flu A&B, Covid) Nasopharyngeal Swab     Status: None   Collection Time: 10/24/20  2:11 PM   Specimen: Nasopharyngeal Swab; Nasopharyngeal(NP) swabs in vial transport medium  Result Value Ref Range Status   SARS Coronavirus 2 by RT PCR NEGATIVE NEGATIVE Final    Comment: (NOTE) SARS-CoV-2 target nucleic acids are NOT DETECTED.  The SARS-CoV-2 RNA is generally detectable in upper respiratory specimens during the acute phase of infection. The lowest concentration of SARS-CoV-2 viral copies this assay can detect is 138 copies/mL. A negative result does not preclude SARS-Cov-2 infection and should not be used as the sole basis for treatment or other patient management decisions. A negative result may occur with  improper specimen collection/handling, submission of specimen other than  nasopharyngeal swab, presence of viral mutation(s) within the areas targeted by this assay, and inadequate number of viral copies(<138 copies/mL). A negative result must be combined with clinical observations, patient history, and epidemiological information. The expected result is Negative.  Fact Sheet for Patients:  EntrepreneurPulse.com.au  Fact Sheet for Healthcare Providers:  IncredibleEmployment.be  This test is no t yet approved or cleared by the [place] FDA and  has been authorized for detection and/or diagnosis of SARS-CoV-2 by FDA under an Emergency Use Authorization (EUA). This EUA will remain  in effect (meaning this test can be used) for the duration of the COVID-19 declaration under Section 564(b)(1) of the Act, 21 U.S.C.section 360bbb-3(b)(1), unless the authorization is terminated  or revoked sooner.       Influenza A by PCR NEGATIVE NEGATIVE Final   Influenza B by PCR NEGATIVE NEGATIVE Final    Comment: (NOTE) The Xpert Xpress  SARS-CoV-2/FLU/RSV plus assay is intended as an aid in the diagnosis of influenza from Nasopharyngeal swab specimens and should not be used as a sole basis for treatment. Nasal washings and aspirates are unacceptable for Xpert Xpress SARS-CoV-2/FLU/RSV testing.  Fact Sheet for Patients: EntrepreneurPulse.com.au  Fact Sheet for Healthcare Providers: IncredibleEmployment.be  This test is not yet approved or cleared by the [place] FDA and has been authorized for detection and/or diagnosis of SARS-CoV-2 by FDA under an Emergency Use Authorization (EUA). This EUA will remain in effect (meaning this test can be used) for the duration of the COVID-19 declaration under Section 564(b)(1) of the Act, 21 U.S.C. section 360bbb-3(b)(1), unless the authorization is terminated or revoked.  Performed at [facility], [place] [address] [place], [place] [address]   Resp Panel by RT-PCR (Flu A&B, Covid) Nasopharyngeal Swab     Status: None   Collection Time: 10/26/20  9:07 PM   Specimen: Nasopharyngeal Swab; Nasopharyngeal(NP) swabs in vial transport medium  Result Value Ref Range Status   SARS Coronavirus 2 by RT PCR NEGATIVE NEGATIVE Final    Comment: (NOTE) SARS-CoV-2 target nucleic acids are NOT DETECTED.  The SARS-CoV-2 RNA is generally detectable in upper respiratory specimens during the acute phase of infection. The lowest concentration of SARS-CoV-2 viral copies this assay can detect is 138 copies/mL. A negative result does not preclude SARS-Cov-2 infection and should not be used as the sole basis for treatment or other patient management decisions. A negative result may occur with  improper specimen collection/handling, submission of specimen other than nasopharyngeal swab, presence of viral mutation(s) within the areas targeted by this assay, and inadequate number of viral copies(<138 copies/mL). A negative result must be combined  with clinical observations, patient history, and epidemiological information. The expected result is Negative.  Fact Sheet for Patients:  EntrepreneurPulse.com.au  Fact Sheet for Healthcare Providers:  IncredibleEmployment.be  This test is no t yet approved or cleared by the [place] FDA and  has been authorized for detection and/or diagnosis of SARS-CoV-2 by FDA under an Emergency Use Authorization (EUA). This EUA will remain  in effect (meaning this test can be used) for the duration of the COVID-19 declaration under Section 564(b)(1) of the Act, 21 U.S.C.section 360bbb-3(b)(1), unless the authorization is terminated  or revoked sooner.       Influenza A by PCR NEGATIVE NEGATIVE Final   Influenza B by PCR NEGATIVE NEGATIVE Final    Comment: (NOTE) The Xpert Xpress SARS-CoV-2/FLU/RSV plus assay is intended as an aid in the diagnosis of influenza from Nasopharyngeal swab specimens  and should not be used as a sole basis for treatment. Nasal washings and aspirates are unacceptable for Xpert Xpress SARS-CoV-2/FLU/RSV testing.  Fact Sheet for Patients: EntrepreneurPulse.com.au  Fact Sheet for Healthcare Providers: IncredibleEmployment.be  This test is not yet approved or cleared by the [place] FDA and has been authorized for detection and/or diagnosis of SARS-CoV-2 by FDA under an Emergency Use Authorization (EUA). This EUA will remain in effect (meaning this test can be used) for the duration of the COVID-19 declaration under Section 564(b)(1) of the Act, 21 U.S.C. section 360bbb-3(b)(1), unless the authorization is terminated or revoked.  Performed at [facility], [address] [place], [place] [address]      Radiology Studies: DG Chest Portable 1 View  Result Date: 10/26/2020 CLINICAL DATA:  Chest and abdominal pain. EXAM: PORTABLE CHEST 1 VIEW COMPARISON:  10/24/2020 FINDINGS: The  heart is borderline enlarged but stable. The mediastinal and hilar contours are within normal limits. Streaky basilar scarring changes but no infiltrates or effusions. No pulmonary lesions. The bony thorax is intact. IMPRESSION: Streaky basilar scarring changes.  No acute pulmonary findings. Electronically Signed   By: Marijo Sanes M.D.   On: 10/26/2020 19:07   US Abdomen Limited RUQ (LIVER/GB)  Result Date: 10/27/2020 CLINICAL DATA:  Acute pancreatitis EXAM: ULTRASOUND ABDOMEN LIMITED RIGHT UPPER QUADRANT COMPARISON:  CT Abdomen Pelvis, 10/24/2020.  CT chest, 03/04/2019. FINDINGS: Suboptimal evaluation, secondary to shadowing by overlying bowel gas. Gallbladder: Mildly distended gallbladder. No gallstones or wall thickening visualized. No sonographic Murphy sign noted by sonographer. Common bile duct: Diameter: 6 mm Liver: No focal lesion identified. Within normal limits in parenchymal echogenicity. Portal vein is patent on color Doppler imaging with normal direction of blood flow towards the liver. Other: No perihepatic ascites. IMPRESSION: Mildly distended gallbladder, without additional sonographic findings to suggest acute cholecystitis. Electronically Signed   By: Michaelle Birks M.D.   On: 10/27/2020 10:08     Scheduled Meds:  carvedilol  6.25 mg Oral BID   digoxin  0.125 mg Oral Daily   enoxaparin (LOVENOX) injection  40 mg Subcutaneous Q24H   gabapentin  300 mg Oral BID   polyethylene glycol  17 g Oral BID   sacubitril-valsartan  1 tablet Oral BID   senna-docusate  1 tablet Oral BID   Continuous Infusions:   LOS: 1 day    Time spent: 35 minutes    Barton Dubois, MD [facility]   To contact the attending provider between 7A-7P or the covering provider during after hours 7P-7A, please log into the web site [email or web address] and access using universal [place] password for that web site. If you do not have the password, please call the hospital operator.  10/27/2020, 5:59 PM

## 2020-10-28 DIAGNOSIS — J410 Simple chronic bronchitis: Secondary | ICD-10-CM | POA: Diagnosis not present

## 2020-10-28 DIAGNOSIS — E876 Hypokalemia: Secondary | ICD-10-CM | POA: Diagnosis not present

## 2020-10-28 DIAGNOSIS — I5042 Chronic combined systolic (congestive) and diastolic (congestive) heart failure: Secondary | ICD-10-CM | POA: Diagnosis not present

## 2020-10-28 DIAGNOSIS — K859 Acute pancreatitis without necrosis or infection, unspecified: Secondary | ICD-10-CM | POA: Diagnosis not present

## 2020-10-28 LAB — CBC
HCT: 36.9 % — ABNORMAL LOW (ref 39.0–52.0)
Hemoglobin: 12.1 g/dL — ABNORMAL LOW (ref 13.0–17.0)
MCH: 32.3 pg (ref 26.0–34.0)
MCHC: 32.8 g/dL (ref 30.0–36.0)
MCV: 98.4 fL (ref 80.0–100.0)
Platelets: 271 10*3/uL (ref 150–400)
RBC: 3.75 MIL/uL — ABNORMAL LOW (ref 4.22–5.81)
RDW: 15.9 % — ABNORMAL HIGH (ref 11.5–15.5)
WBC: 10 10*3/uL (ref 4.0–10.5)
nRBC: 0 % (ref 0.0–0.2)

## 2020-10-28 LAB — BASIC METABOLIC PANEL
Anion gap: 3 — ABNORMAL LOW (ref 5–15)
BUN: 7 mg/dL — ABNORMAL LOW (ref 8–23)
CO2: 29 mmol/L (ref 22–32)
Calcium: 8.4 mg/dL — ABNORMAL LOW (ref 8.9–10.3)
Chloride: 101 mmol/L (ref 98–111)
Creatinine, Ser: 0.89 mg/dL (ref 0.61–1.24)
GFR, Estimated: >60 mL/min (ref >60)
Glucose, Bld: 91 mg/dL (ref 70–99)
Potassium: 4.5 mmol/L (ref 3.5–5.1)
Sodium: 133 mmol/L — ABNORMAL LOW (ref 135–145)

## 2020-10-28 LAB — LIPASE, BLOOD: Lipase: 66 U/L — ABNORMAL HIGH (ref 11–51)

## 2020-10-28 MED ORDER — OXYCODONE HCL 5 MG PO TABS
5.0000 mg | ORAL_TABLET | Freq: Four times a day (QID) | ORAL | Status: DC | PRN
  Administered 2020-10-28 – 2020-10-29 (×4): 10 mg via ORAL
  Filled 2020-10-28 (×4): qty 2

## 2020-10-28 MED ORDER — PANCRELIPASE (LIP-PROT-AMYL) 12000-38000 UNITS PO CPEP
24000.0000 [IU] | ORAL_CAPSULE | Freq: Three times a day (TID) | ORAL | Status: DC
  Administered 2020-10-29: 24000 [IU] via ORAL
  Filled 2020-10-28: qty 2

## 2020-10-28 NOTE — Progress Notes (Signed)
PROGRESS NOTE    Ryan [name]  [id number] DOB: 1950/06/25 DOA: 10/26/2020 PCP: [facility]   Chief Complaint  Patient presents with   Abdominal Pain    Brief admission narrative:  As per H&P written by Dr. Myna Hidalgo on 10/26/2020 Ryan [name] is a 70 y.o. male with medical history significant for nonischemic cardiomyopathy with EF 10 to 15% and 2020, mild renal insufficiency, COPD, and history of drug and alcohol abuse, now presenting to the emergency department for evaluation of worsening abdominal pain and nausea.  Patient has been admitted to [facility] on 10/24/2020 with acute pancreatitis but left yesterday AMA.  Pain has worsened and nausea persisted since he left the he denies any vomiting.  He attempted to drink some ginger ale at home but had significant worsening in his pain.  He acknowledges a history of alcohol abuse but reports that it has been at least a week or so since he has had anything to drink and has not had more than 3 alcoholic beverages in a day for years.  He denies any fevers, chills, chest pain, shortness of breath, cough, or leg swelling.   ED Course: Upon arrival to the ED, patient is found to be afebrile, saturating well on room air, and with systolic blood pressure in the low 100s.  EKG features sinus tachycardia with rate 106 and PVC.  Chest x-ray with no acute findings.  CBC with leukocytosis 12,400.  Chemistry panel notable for potassium 3.0, creatinine 1.03, normal LFTs, and lipase 119.  Patient was treated with Dilaudid and Zofran in the ED.   Assessment & Plan: 1-acute pancreatitis: Alcohol induced. -Cessation counseling provided -Continue as needed analgesics, gentle fluid resuscitation and supportive care -So far tolerating soft diet -Analgesics will be transition to oral route. -Patient will be started on Creon. -Lipase 66 this morning.  2-CKD (chronic kidney disease), stage II -Appears to be stable and at baseline -Continue to  follow renal function trend.  3-COPD (chronic obstructive pulmonary disease) (HCC) -No wheezing or requiring oxygen supplementation: -Continue as needed bronchodilators.  4-Hypokalemia -In the setting of GI losses prior to admission and decreased oral intake. -Repleted and within normal limits -Will discontinue telemetry at this point. -Continue to follow electrolytes trend intermittently  5-alcohol abuse -Patient expressed to be completely alcohol free for the last 2 weeks -No withdrawal symptoms reported.  6-gastroesophageal flux disease -Continue PPI.  7-Chronic combined systolic and diastolic CHF (congestive heart failure) ([place]) -With component of nonischemic cardiomyopathy ([place]) -Has remained stable and compensated -Continue to follow daily weights and strict I's and O's -Low-sodium diet education has been provided.    DVT prophylaxis: Lovenox Code Status: Full code Family Communication: No family at bedside. Disposition:   Status is: Inpatient  Remains inpatient appropriate because:IV treatments appropriate due to intensity of illness or inability to take PO  Dispo: The patient is from: Home              Anticipated d/c is to: Home              Patient currently is not medically stable to d/c.   Difficult to place patient No       Consultants:  None   Procedures:  See below for x-ray reports  Antimicrobials:  None   Subjective: No fever, no chest pain, no nausea, no vomiting.  Reports tolerating soft diet.  Still experiencing intermittent midepigastric discomfort (Mainly with food intake).  Objective: Vitals:   10/27/20 2028  10/28/20 0349 10/28/20 0500 10/28/20 0836  BP: 110/75 (!) 180/67  103/72  Pulse: 98 78  88  Resp: '20 20  16  '$ Temp: 98.3 F (36.8 C) 98 F (36.7 C)  98.3 F (36.8 C)  TempSrc:  Oral  Oral  SpO2: 100% 96%  95%  Weight:   81.6 kg   Height:        Intake/Output Summary (Last 24 hours) at 10/28/2020 1325 Last data filed  at 10/27/2020 1823 Gross per 24 hour  Intake 480 ml  Output 1 ml  Net 479 ml   Filed Weights   10/26/20 2300 10/27/20 0500 10/28/20 0500  Weight: 81.6 kg 81.6 kg 81.6 kg    Examination: General exam: Alert, awake, oriented x 3, still reporting midepigastric pain; no chest pain, no nausea, no vomiting, no shortness of breath. Respiratory system: Clear to auscultation. Respiratory effort normal.  No using accessory muscles.  No wheezing or crackles on exam. Cardiovascular system:RRR. No murmurs, rubs, gallops.  No JVD. Gastrointestinal system: Abdomen is nondistended, soft and with positive bowel sounds.  Tender to palpation midepigastric area.  Patient expressed pain to be intermittently present and is slightly worse with food intake. Central nervous system: Alert and oriented. No focal neurological deficits. Extremities: No cyanosis or clubbing.  No edema. Skin: No petechiae. Psychiatry: Judgement and insight appear normal. Mood & affect appropriate.   Data Reviewed: I have personally reviewed following labs and imaging studies  CBC: Recent Labs  Lab 10/24/20 0658 10/25/20 0909 10/26/20 1552 10/27/20 0432 10/28/20 0700  WBC 14.9* 11.7* 12.4* 10.9* 10.0  HGB 15.0 14.1 14.1 12.7* 12.1*  HCT 44.9 42.7 44.2 40.3 36.9*  MCV 95.5 96.0 99.8 98.8 98.4  PLT 237 229 290 269 [id number]    Basic Metabolic Panel: Recent Labs  Lab 10/24/20 0658 10/25/20 0909 10/26/20 1552 10/27/20 0432 10/28/20 0700  NA 136 136 136 135 133*  K 3.5 3.6 3.0* 3.6 4.5  CL 99 99 100 98 101  CO2 '28 29 28 29 29  '$ GLUCOSE 112* 120* 98 96 91  BUN '9 9 10 9 '$ 7*  CREATININE 1.04 0.94 1.03 1.00 0.89  CALCIUM 8.8* 8.8* 9.1 8.5* 8.4*  MG  --   --   --  2.1  --     GFR: Estimated Creatinine Clearance: 81.1 mL/min (by C-G formula based on SCr of 0.89 mg/dL).  Liver Function Tests: Recent Labs  Lab 10/24/20 0658 10/25/20 0909 10/26/20 1552 10/27/20 0432  AST '15 15 24 20  '$ ALT '17 12 16 12  '$ ALKPHOS 120 121  129* 98  BILITOT 0.9 0.8 0.7 0.6  PROT 7.1 6.8 8.2* 6.8  ALBUMIN 3.0* 2.9* 3.5 3.0*    CBG: No results for input(s): GLUCAP in the last 168 hours.   Recent Results (from the past 240 hour(s))  Resp Panel by RT-PCR (Flu A&B, Covid) Nasopharyngeal Swab     Status: None   Collection Time: 10/24/20  2:11 PM   Specimen: Nasopharyngeal Swab; Nasopharyngeal(NP) swabs in vial transport medium  Result Value Ref Range Status   SARS Coronavirus 2 by RT PCR NEGATIVE NEGATIVE Final    Comment: (NOTE) SARS-CoV-2 target nucleic acids are NOT DETECTED.  The SARS-CoV-2 RNA is generally detectable in upper respiratory specimens during the acute phase of infection. The lowest concentration of SARS-CoV-2 viral copies this assay can detect is 138 copies/mL. A negative result does not preclude SARS-Cov-2 infection and should not be used as the sole basis for treatment  or other patient management decisions. A negative result may occur with  improper specimen collection/handling, submission of specimen other than nasopharyngeal swab, presence of viral mutation(s) within the areas targeted by this assay, and inadequate number of viral copies(<138 copies/mL). A negative result must be combined with clinical observations, patient history, and epidemiological information. The expected result is Negative.  Fact Sheet for Patients:  EntrepreneurPulse.com.au  Fact Sheet for Healthcare Providers:  IncredibleEmployment.be  This test is no t yet approved or cleared by the [place] FDA and  has been authorized for detection and/or diagnosis of SARS-CoV-2 by FDA under an Emergency Use Authorization (EUA). This EUA will remain  in effect (meaning this test can be used) for the duration of the COVID-19 declaration under Section 564(b)(1) of the Act, 21 U.S.C.section 360bbb-3(b)(1), unless the authorization is terminated  or revoked sooner.       Influenza A by PCR  NEGATIVE NEGATIVE Final   Influenza B by PCR NEGATIVE NEGATIVE Final    Comment: (NOTE) The Xpert Xpress SARS-CoV-2/FLU/RSV plus assay is intended as an aid in the diagnosis of influenza from Nasopharyngeal swab specimens and should not be used as a sole basis for treatment. Nasal washings and aspirates are unacceptable for Xpert Xpress SARS-CoV-2/FLU/RSV testing.  Fact Sheet for Patients: EntrepreneurPulse.com.au  Fact Sheet for Healthcare Providers: IncredibleEmployment.be  This test is not yet approved or cleared by the [place] FDA and has been authorized for detection and/or diagnosis of SARS-CoV-2 by FDA under an Emergency Use Authorization (EUA). This EUA will remain in effect (meaning this test can be used) for the duration of the COVID-19 declaration under Section 564(b)(1) of the Act, 21 U.S.C. section 360bbb-3(b)(1), unless the authorization is terminated or revoked.  Performed at [facility], [place] [address], [place], [place] [address]   Resp Panel by RT-PCR (Flu A&B, Covid) Nasopharyngeal Swab     Status: None   Collection Time: 10/26/20  9:07 PM   Specimen: Nasopharyngeal Swab; Nasopharyngeal(NP) swabs in vial transport medium  Result Value Ref Range Status   SARS Coronavirus 2 by RT PCR NEGATIVE NEGATIVE Final    Comment: (NOTE) SARS-CoV-2 target nucleic acids are NOT DETECTED.  The SARS-CoV-2 RNA is generally detectable in upper respiratory specimens during the acute phase of infection. The lowest concentration of SARS-CoV-2 viral copies this assay can detect is 138 copies/mL. A negative result does not preclude SARS-Cov-2 infection and should not be used as the sole basis for treatment or other patient management decisions. A negative result may occur with  improper specimen collection/handling, submission of specimen other than nasopharyngeal swab, presence of viral mutation(s) within the areas targeted by this  assay, and inadequate number of viral copies(<138 copies/mL). A negative result must be combined with clinical observations, patient history, and epidemiological information. The expected result is Negative.  Fact Sheet for Patients:  EntrepreneurPulse.com.au  Fact Sheet for Healthcare Providers:  IncredibleEmployment.be  This test is no t yet approved or cleared by the [place] FDA and  has been authorized for detection and/or diagnosis of SARS-CoV-2 by FDA under an Emergency Use Authorization (EUA). This EUA will remain  in effect (meaning this test can be used) for the duration of the COVID-19 declaration under Section 564(b)(1) of the Act, 21 U.S.C.section 360bbb-3(b)(1), unless the authorization is terminated  or revoked sooner.       Influenza A by PCR NEGATIVE NEGATIVE Final   Influenza B by PCR NEGATIVE NEGATIVE Final    Comment: (NOTE)  The Xpert Xpress SARS-CoV-2/FLU/RSV plus assay is intended as an aid in the diagnosis of influenza from Nasopharyngeal swab specimens and should not be used as a sole basis for treatment. Nasal washings and aspirates are unacceptable for Xpert Xpress SARS-CoV-2/FLU/RSV testing.  Fact Sheet for Patients: EntrepreneurPulse.com.au  Fact Sheet for Healthcare Providers: IncredibleEmployment.be  This test is not yet approved or cleared by the [place] FDA and has been authorized for detection and/or diagnosis of SARS-CoV-2 by FDA under an Emergency Use Authorization (EUA). This EUA will remain in effect (meaning this test can be used) for the duration of the COVID-19 declaration under Section 564(b)(1) of the Act, 21 U.S.C. section 360bbb-3(b)(1), unless the authorization is terminated or revoked.  Performed at [facility], [address], [place], [place] [address]      Radiology Studies: DG Chest Portable 1 View  Result Date: 10/26/2020 CLINICAL DATA:   Chest and abdominal pain. EXAM: PORTABLE CHEST 1 VIEW COMPARISON:  10/24/2020 FINDINGS: The heart is borderline enlarged but stable. The mediastinal and hilar contours are within normal limits. Streaky basilar scarring changes but no infiltrates or effusions. No pulmonary lesions. The bony thorax is intact. IMPRESSION: Streaky basilar scarring changes.  No acute pulmonary findings. Electronically Signed   By: Marijo Sanes M.D.   On: 10/26/2020 19:07   US Abdomen Limited RUQ (LIVER/GB)  Result Date: 10/27/2020 CLINICAL DATA:  Acute pancreatitis EXAM: ULTRASOUND ABDOMEN LIMITED RIGHT UPPER QUADRANT COMPARISON:  CT Abdomen Pelvis, 10/24/2020.  CT chest, 03/04/2019. FINDINGS: Suboptimal evaluation, secondary to shadowing by overlying bowel gas. Gallbladder: Mildly distended gallbladder. No gallstones or wall thickening visualized. No sonographic Murphy sign noted by sonographer. Common bile duct: Diameter: 6 mm Liver: No focal lesion identified. Within normal limits in parenchymal echogenicity. Portal vein is patent on color Doppler imaging with normal direction of blood flow towards the liver. Other: No perihepatic ascites. IMPRESSION: Mildly distended gallbladder, without additional sonographic findings to suggest acute cholecystitis. Electronically Signed   By: Michaelle Birks M.D.   On: 10/27/2020 10:08     Scheduled Meds:  carvedilol  6.25 mg Oral BID   digoxin  0.125 mg Oral Daily   enoxaparin (LOVENOX) injection  40 mg Subcutaneous Q24H   gabapentin  300 mg Oral BID   lipase/protease/amylase  24,000 Units Oral TID AC   polyethylene glycol  17 g Oral BID   sacubitril-valsartan  1 tablet Oral BID   senna-docusate  1 tablet Oral BID   Continuous Infusions:   LOS: 2 days    Time spent: 35 minutes    Barton Dubois, MD [facility]   To contact the attending provider between 7A-7P or the covering provider during after hours 7P-7A, please log into the web site [email or web address] and access  using universal [place] password for that web site. If you do not have the password, please call the hospital operator.  10/28/2020, 1:25 PM

## 2020-10-29 DIAGNOSIS — J410 Simple chronic bronchitis: Secondary | ICD-10-CM | POA: Diagnosis not present

## 2020-10-29 DIAGNOSIS — F1010 Alcohol abuse, uncomplicated: Secondary | ICD-10-CM

## 2020-10-29 DIAGNOSIS — N182 Chronic kidney disease, stage 2 (mild): Secondary | ICD-10-CM | POA: Diagnosis not present

## 2020-10-29 DIAGNOSIS — I5042 Chronic combined systolic (congestive) and diastolic (congestive) heart failure: Secondary | ICD-10-CM | POA: Diagnosis not present

## 2020-10-29 DIAGNOSIS — K859 Acute pancreatitis without necrosis or infection, unspecified: Secondary | ICD-10-CM | POA: Diagnosis not present

## 2020-10-29 LAB — CBC
HCT: 35.4 % — ABNORMAL LOW (ref 39.0–52.0)
Hemoglobin: 11.5 g/dL — ABNORMAL LOW (ref 13.0–17.0)
MCH: 31.3 pg (ref 26.0–34.0)
MCHC: 32.5 g/dL (ref 30.0–36.0)
MCV: 96.5 fL (ref 80.0–100.0)
Platelets: 268 10*3/uL (ref 150–400)
RBC: 3.67 MIL/uL — ABNORMAL LOW (ref 4.22–5.81)
RDW: 15.7 % — ABNORMAL HIGH (ref 11.5–15.5)
WBC: 9.1 10*3/uL (ref 4.0–10.5)
nRBC: 0 % (ref 0.0–0.2)

## 2020-10-29 LAB — BASIC METABOLIC PANEL
Anion gap: 5 (ref 5–15)
BUN: 7 mg/dL — ABNORMAL LOW (ref 8–23)
CO2: 29 mmol/L (ref 22–32)
Calcium: 8.8 mg/dL — ABNORMAL LOW (ref 8.9–10.3)
Chloride: 100 mmol/L (ref 98–111)
Creatinine, Ser: 0.81 mg/dL (ref 0.61–1.24)
GFR, Estimated: >60 mL/min (ref >60)
Glucose, Bld: 110 mg/dL — ABNORMAL HIGH (ref 70–99)
Potassium: 3.9 mmol/L (ref 3.5–5.1)
Sodium: 134 mmol/L — ABNORMAL LOW (ref 135–145)

## 2020-10-29 MED ORDER — SENNOSIDES-DOCUSATE SODIUM 8.6-50 MG PO TABS: 1.0000 | ORAL_TABLET | Freq: Two times a day (BID) | ORAL | 1 refills | Status: DC

## 2020-10-29 MED ORDER — POLYETHYLENE GLYCOL 3350 17 G PO PACK: 17.0000 g | PACK | Freq: Two times a day (BID) | ORAL | 0 refills | Status: DC

## 2020-10-29 MED ORDER — OXYCODONE HCL 5 MG PO TABS: 5.0000 mg | ORAL_TABLET | Freq: Four times a day (QID) | ORAL | 0 refills | Status: DC | PRN

## 2020-10-29 MED ORDER — PANCRELIPASE (LIP-PROT-AMYL) 24000-76000 UNITS PO CPEP: 24000.0000 [IU] | ORAL_CAPSULE | Freq: Three times a day (TID) | ORAL | 2 refills | Status: DC

## 2020-10-29 MED ORDER — FUROSEMIDE 40 MG PO TABS: 40.0000 mg | ORAL_TABLET | Freq: Every day | ORAL | Status: DC | PRN

## 2020-10-29 NOTE — Discharge Summary (Signed)
Physician Discharge Summary  Ryan [name] PFX:[id number] DOB: 14-Dec-1950 DOA: 10/26/2020  PCP: [facility]  Admit date: 10/26/2020 Discharge date: 10/29/2020  Time spent: 35 minutes  Recommendations for Outpatient Follow-up:  Repeat complete metabolic panel to follow electrolytes, renal function LFTs. Reassess blood pressure and adjust antihypertensive regimen as needed. Continue assisting patient with alcohol cessation and abstinence.   Discharge Diagnoses:  Principal Problem:   Acute pancreatitis Active Problems:   CKD (chronic kidney disease), stage II   COPD (chronic obstructive pulmonary disease) (HCC)   Hypokalemia   Chronic combined systolic and diastolic CHF (congestive heart failure) (HCC)   Nonischemic cardiomyopathy ([place])   Alcohol abuse   Discharge Condition: Stable and improved.  Discharged home with instruction to follow-up with PCP in 10 days.  CODE STATUS: Full code.  Diet recommendation: Heart healthy/low-sodium diet.  Filed Weights   10/26/20 2300 10/27/20 0500 10/28/20 0500  Weight: 81.6 kg 81.6 kg 81.6 kg    History of present illness:  As per H&P written by Dr. Myna Hidalgo on 10/26/2020 Ryan [name] is a 70 y.o. male with medical history significant for nonischemic cardiomyopathy with EF 10 to 15% and 2020, mild renal insufficiency, COPD, and history of drug and alcohol abuse, now presenting to the emergency department for evaluation of worsening abdominal pain and nausea.  Patient has been admitted to [facility] on 10/24/2020 with acute pancreatitis but left yesterday AMA.  Pain has worsened and nausea persisted since he left the he denies any vomiting.  He attempted to drink some ginger ale at home but had significant worsening in his pain.  He acknowledges a history of alcohol abuse but reports that it has been at least a week or so since he has had anything to drink and has not had more than 3 alcoholic beverages in a day for years.  He denies  any fevers, chills, chest pain, shortness of breath, cough, or leg swelling.   ED Course: Upon arrival to the ED, patient is found to be afebrile, saturating well on room air, and with systolic blood pressure in the low 100s.  EKG features sinus tachycardia with rate 106 and PVC.  Chest x-ray with no acute findings.  CBC with leukocytosis 12,400.  Chemistry panel notable for potassium 3.0, creatinine 1.03, normal LFTs, and lipase 119.  Patient was treated with Dilaudid and Zofran in the ED.    Hospital Course:  1-acute on chronic pancreatitis: Alcohol induced. -Cessation counseling provided. -Continue as needed analgesics and adequate hydration. -Patient tolerating diet at time of discharge and clinically significantly improved and ready to go home. -Analgesics were transitioned to oral route; Patient also started on Creon. -Last lipase check 66 and trending down.     2-CKD (chronic kidney disease), stage II -Appears to be stable and at baseline -Continue to intermittently follow renal function trend and instability.   3-COPD (chronic obstructive pulmonary disease) (HCC) -No wheezing or requiring oxygen supplementation: -Continue as needed bronchodilators.   4-Hypokalemia -In the setting of GI losses prior to admission, alcohol use and decreased oral intake. -Repleted and within normal limits at time of discharge. -Repeat basic metabolic panel to follow electrolytes trend and instability.   5-alcohol abuse -Patient expressed to be completely alcohol free for the last 2 weeks -No withdrawal symptoms reported. -Cessation counseling has been provided. -Continue multivitamins and folic acid.   6-gastroesophageal flux disease -Continue PPI.   7-Chronic combined systolic and diastolic CHF (congestive heart failure) ([place]) -With component of  nonischemic cardiomyopathy per chart review. -Patient's condition has remained stable and compensated -Continue to follow daily weights and  low-sodium diet -Resume home heart failure regimen as previously prescribed. -Continue patient follow-up with cardiology/heart failure service.   Procedures: See below for x-ray reports.  Consultations: None   Discharge Exam: Vitals:   10/28/20 2324 10/29/20 0607  BP: 103/69 93/64  Pulse: 92 85  Resp: 19 19  Temp: 98.8 F (37.1 C) 98.4 F (36.9 C)  SpO2: 99% 97%    General: No nausea, no vomiting, improving/control abdominal discomfort at time of discharge.  Patient denies shortness of breath, orthopnea, palpitation or chest pain. Cardiovascular: S1 and S2, no rubs, no gallops, no JVD. Respiratory: Clear to auscultation bilaterally. Abdomen: Soft, no guarding, positive bowel sounds.  No distention appreciated. Extremities: No cyanosis or clubbing.  Discharge Instructions   Discharge Instructions     Diet - low sodium heart healthy   Complete by: As directed    Discharge instructions   Complete by: As directed    Follow low-fat diet Abdomen follow with PCP in 10 days Maintain adequate hydration Wipes, no sodium in your diet and check your weight on daily basis Take medications as prescribed.      Allergies as of 10/29/2020       Reactions   Tape Other (See Comments)   "TAPE TEARS OFF MY SKIN"        Medication List     STOP taking these medications    aspirin EC 81 MG tablet   dorzolamide-timolol 22.3-6.8 MG/ML ophthalmic solution Commonly known as: COSOPT   DSS 100 MG Caps   predniSONE 20 MG tablet Commonly known as: DELTASONE   psyllium 95 % Pack Commonly known as: HYDROCIL/METAMUCIL   rosuvastatin 10 MG tablet Commonly known as: CRESTOR   sildenafil 100 MG tablet Commonly known as: VIAGRA   spironolactone 25 MG tablet Commonly known as: ALDACTONE   traMADol 50 MG tablet Commonly known as: ULTRAM       TAKE these medications    acetaminophen 325 MG tablet Commonly known as: TYLENOL Take 650 mg by mouth every 6 (six) hours as  needed for mild pain.   albuterol 108 (90 Base) MCG/ACT inhaler Commonly known as: VENTOLIN HFA Inhale 2 puffs into the lungs every 6 (six) hours as needed for wheezing or shortness of breath.   allopurinol 300 MG tablet Commonly known as: ZYLOPRIM Take 300 mg by mouth daily as needed (gout).   carvedilol 6.25 MG tablet Commonly known as: COREG Take 6.25 mg by mouth once a week.   cyclobenzaprine 10 MG tablet Commonly known as: FLEXERIL Take 10 mg by mouth 3 (three) times daily as needed for muscle spasms.   diclofenac sodium 1 % Gel Commonly known as: VOLTAREN Apply 1 application topically 2 (two) times daily as needed (to painful sites).   digoxin 0.125 MG tablet Commonly known as: LANOXIN Take 0.125 mg by mouth daily as needed (blood pressure).   folic acid 1 MG tablet Commonly known as: FOLVITE Take 1 mg by mouth daily.   furosemide 40 MG tablet Commonly known as: LASIX Take 1 tablet (40 mg total) by mouth daily as needed for fluid or edema. What changed:  when to take this reasons to take this   gabapentin 300 MG capsule Commonly known as: NEURONTIN Take 300 mg by mouth 2 (two) times daily.   hydrOXYzine 25 MG tablet Commonly known as: ATARAX/VISTARIL Take 25 mg by mouth every 4 (four)  hours as needed for anxiety.   indomethacin 50 MG capsule Commonly known as: INDOCIN Take 50 mg by mouth 3 (three) times daily as needed for moderate pain.   ivabradine 5 MG Tabs tablet Commonly known as: CORLANOR Take 1 tablet (5 mg total) by mouth 2 (two) times daily with a meal. What changed:  when to take this reasons to take this   loratadine 10 MG tablet Commonly known as: CLARITIN Take 10 mg by mouth at bedtime.   Melatonin 5 MG Caps Take 5 mg by mouth at bedtime.   Multi-Vitamin tablet Take 1 tablet by mouth daily.   naloxone 4 MG/0.1ML Liqd nasal spray kit Commonly known as: NARCAN Place 1 spray into the nose once as needed (overdose).   oxyCODONE 5 MG  immediate release tablet Commonly known as: Oxy IR/ROXICODONE Take 1-2 tablets (5-10 mg total) by mouth every 6 (six) hours as needed for severe pain.   Pancrelipase (Lip-Prot-Amyl) 24000-76000 units Cpep Take 1 capsule (24,000 Units total) by mouth 3 (three) times daily before meals.   pantoprazole 40 MG tablet Commonly known as: PROTONIX Take 1 tablet (40 mg total) by mouth daily. What changed:  when to take this reasons to take this   polyethylene glycol 17 g packet Commonly known as: MIRALAX / GLYCOLAX Take 17 g by mouth 2 (two) times daily.   sacubitril-valsartan 24-26 MG Commonly known as: ENTRESTO Take 1 tablet by mouth 2 (two) times daily.   senna-docusate 8.6-50 MG tablet Commonly known as: Senokot-S Take 1 tablet by mouth 2 (two) times daily.   thiamine 100 MG tablet Take 200 mg by mouth daily.       Allergies  Allergen Reactions   Tape Other (See Comments)    "TAPE TEARS OFF MY SKIN"    Follow-up [place], [place]. Schedule an appointment as soon as possible for a visit in 10 day(s).   Contact information: [place] [address] [phone]         Bensimhon, Shaune Pascal, MD .   Specialty: Cardiology Contact information: [address] [place] [address] [phone]                 The results of significant diagnostics from this hospitalization (including imaging, microbiology, ancillary and laboratory) are listed below for reference.    Significant Diagnostic Studies: CT ABDOMEN PELVIS WO CONTRAST  Result Date: 10/24/2020 CLINICAL DATA:  Abdominal pain and chest pain for 3 days. History of pancreatitis. EXAM: CT ABDOMEN AND PELVIS WITHOUT CONTRAST TECHNIQUE: Multidetector CT imaging of the abdomen and pelvis was performed following the standard protocol without IV contrast. COMPARISON:  CT abdomen and pelvis dated 03/04/2019 FINDINGS: Lower chest: Mild bibasilar  atelectasis/scarring. Hepatobiliary: No focal liver abnormality is seen. Gallbladder is unremarkable. No bile duct dilatation. Pancreas: Prominent inflammation/fluid stranding about the pancreatic head, with caudal extension into the underlying mesentery, presumed sequela of acute pancreatitis. Pancreatic body and tail are unremarkable. Spleen: Unremarkable. Adrenals/Urinary Tract: 1.7 cm stone within the RIGHT renal pelvis. LEFT kidney is unremarkable. No hydronephrosis. No ureteral or bladder calculi are identified. Bladder is unremarkable, partially decompressed. Stomach/Bowel: No dilated large or small bowel loops. Appendix is normal. Probable reactive thickening of the walls of the duodenum, given its proximity to the aforementioned peripancreatic inflammation/fluid stranding. Vascular/Lymphatic: Aortic atherosclerosis. No enlarged lymph nodes are seen. Reproductive: Prostate with central dystrophic calcifications. Otherwise unremarkable. Other: Small amount of additional free fluid in  the pelvis. No abscess collection is seen. No free intraperitoneal air. Musculoskeletal: No acute-appearing osseous abnormality. IMPRESSION: 1. Prominent inflammation/fluid stranding about the pancreatic head, with caudal extension into the underlying mesentery, presumed sequela of acute pancreatitis. Would consider CT abdomen with IV contrast to evaluate the mesenteric vasculature that is enveloped by the peripancreatic inflammation to exclude associated thrombosis or hemorrhage. 2. 1.7 cm stone within the RIGHT renal pelvis. No hydronephrosis. No ureteral or bladder calculi. 3. Small amount of additional free fluid in the pelvis. No abscess collection is seen. No free intraperitoneal air. Aortic Atherosclerosis (ICD10-I70.0). Electronically Signed   By: Franki Cabot M.D.   On: 10/24/2020 13:42   CT ABDOMEN PELVIS W CONTRAST  Result Date: 10/24/2020 CLINICAL DATA:  Abdominal pain.  History of pancreatitis EXAM: CT ABDOMEN  AND PELVIS WITH CONTRAST TECHNIQUE: Multidetector CT imaging of the abdomen and pelvis was performed using the standard protocol following bolus administration of intravenous contrast. CONTRAST:  53m OMNIPAQUE IOHEXOL 350 MG/ML SOLN COMPARISON:  10/24/2020, 03/04/2019 FINDINGS: Lower chest: Bibasilar atelectasis. Mild cardiomegaly. Bilateral gynecomastia. Hepatobiliary: No focal liver abnormality is identified. Gallbladder is mildly dilated. Mild intra and extrahepatic biliary dilatation. Pancreas: Extensive ill-defined fluid and inflammation surrounding the pancreatic head, neck, and proximal body with fluid extending into the central mesentery surrounding the superior mesenteric vessels. No organized or rim enhancing fluid collection. There is parenchymal atrophy and main pancreatic ductal dilatation. No definite parenchymal necrosis. Spleen: Normal in size without focal abnormality. Adrenals/Urinary Tract: Large stone persist within the right renal pelvis. Bilateral low-density renal lesions, likely cysts. No hydronephrosis. Urinary bladder unremarkable. Stomach/Bowel: Stomach is unremarkable. Thickened appearance of the second and third portions of the duodenum, likely reactive. Scattered colonic diverticulosis. Unremarkable appendix in the right lower quadrant. Vascular/Lymphatic: Scattered aortoiliac atherosclerotic calcifications without aneurysm. Splenic vein, superior mesenteric vein, and portal vein are opacified without evidence of thrombosis. No abdominopelvic lymphadenopathy. Reproductive: Nonenlarged prostate gland with coarse calcifications. Other: No pneumoperitoneum.  No abdominal wall hernia. Musculoskeletal: Chronic deformities of the left hip and pelvis. No acute osseous findings. IMPRESSION: 1. Findings of pancreatitis with extensive ill-defined fluid and inflammation surrounding the pancreas with fluid extending into the central mesentery surrounding the superior mesenteric vessels. No  evidence of an acute vascular complication. No organized or rim enhancing fluid collection. 2. Large nonobstructing right renal calculus. Aortic Atherosclerosis (ICD10-I70.0). Electronically Signed   By: NDavina PokeD.O.   On: 10/24/2020 15:53   DG Chest Portable 1 View  Result Date: 10/26/2020 CLINICAL DATA:  Chest and abdominal pain. EXAM: PORTABLE CHEST 1 VIEW COMPARISON:  10/24/2020 FINDINGS: The heart is borderline enlarged but stable. The mediastinal and hilar contours are within normal limits. Streaky basilar scarring changes but no infiltrates or effusions. No pulmonary lesions. The bony thorax is intact. IMPRESSION: Streaky basilar scarring changes.  No acute pulmonary findings. Electronically Signed   By: PMarijo SanesM.D.   On: 10/26/2020 19:07   DG Chest Portable 1 View  Result Date: 10/24/2020 CLINICAL DATA:  Chest pain.  History of pancreatitis. EXAM: PORTABLE CHEST 1 VIEW COMPARISON:  October 08, 2019 FINDINGS: Stable cardiomegaly.  The lungs are clear.  No pneumothorax. IMPRESSION: No active disease. Electronically Signed   By: DDorise BullionIII M.D.   On: 10/24/2020 13:53   UKoreaAbdomen Limited RUQ (LIVER/GB)  Result Date: 10/27/2020 CLINICAL DATA:  Acute pancreatitis EXAM: ULTRASOUND ABDOMEN LIMITED RIGHT UPPER QUADRANT COMPARISON:  CT Abdomen Pelvis, 10/24/2020.  CT chest, 03/04/2019. FINDINGS: Suboptimal evaluation, secondary to  shadowing by overlying bowel gas. Gallbladder: Mildly distended gallbladder. No gallstones or wall thickening visualized. No sonographic Murphy sign noted by sonographer. Common bile duct: Diameter: 6 mm Liver: No focal lesion identified. Within normal limits in parenchymal echogenicity. Portal vein is patent on color Doppler imaging with normal direction of blood flow towards the liver. Other: No perihepatic ascites. IMPRESSION: Mildly distended gallbladder, without additional sonographic findings to suggest acute cholecystitis. Electronically Signed   By:  Michaelle Birks M.D.   On: 10/27/2020 10:08    Microbiology: Recent Results (from the past 240 hour(s))  Resp Panel by RT-PCR (Flu A&B, Covid) Nasopharyngeal Swab     Status: None   Collection Time: 10/24/20  2:11 PM   Specimen: Nasopharyngeal Swab; Nasopharyngeal(NP) swabs in vial transport medium  Result Value Ref Range Status   SARS Coronavirus 2 by RT PCR NEGATIVE NEGATIVE Final    Comment: (NOTE) SARS-CoV-2 target nucleic acids are NOT DETECTED.  The SARS-CoV-2 RNA is generally detectable in upper respiratory specimens during the acute phase of infection. The lowest concentration of SARS-CoV-2 viral copies this assay can detect is 138 copies/mL. A negative result does not preclude SARS-Cov-2 infection and should not be used as the sole basis for treatment or other patient management decisions. A negative result may occur with  improper specimen collection/handling, submission of specimen other than nasopharyngeal swab, presence of viral mutation(s) within the areas targeted by this assay, and inadequate number of viral copies(<138 copies/mL). A negative result must be combined with clinical observations, patient history, and epidemiological information. The expected result is Negative.  Fact Sheet for Patients:  EntrepreneurPulse.com.au  Fact Sheet for Healthcare Providers:  IncredibleEmployment.be  This test is no t yet approved or cleared by the [place] FDA and  has been authorized for detection and/or diagnosis of SARS-CoV-2 by FDA under an Emergency Use Authorization (EUA). This EUA will remain  in effect (meaning this test can be used) for the duration of the COVID-19 declaration under Section 564(b)(1) of the Act, 21 U.S.C.section 360bbb-3(b)(1), unless the authorization is terminated  or revoked sooner.       Influenza A by PCR NEGATIVE NEGATIVE Final   Influenza B by PCR NEGATIVE NEGATIVE Final    Comment: (NOTE) The Xpert  Xpress SARS-CoV-2/FLU/RSV plus assay is intended as an aid in the diagnosis of influenza from Nasopharyngeal swab specimens and should not be used as a sole basis for treatment. Nasal washings and aspirates are unacceptable for Xpert Xpress SARS-CoV-2/FLU/RSV testing.  Fact Sheet for Patients: EntrepreneurPulse.com.au  Fact Sheet for Healthcare Providers: IncredibleEmployment.be  This test is not yet approved or cleared by the [place] FDA and has been authorized for detection and/or diagnosis of SARS-CoV-2 by FDA under an Emergency Use Authorization (EUA). This EUA will remain in effect (meaning this test can be used) for the duration of the COVID-19 declaration under Section 564(b)(1) of the Act, 21 U.S.C. section 360bbb-3(b)(1), unless the authorization is terminated or revoked.  Performed at [facility], [place] [address] [place], [place] [address]   Resp Panel by RT-PCR (Flu A&B, Covid) Nasopharyngeal Swab     Status: None   Collection Time: 10/26/20  9:07 PM   Specimen: Nasopharyngeal Swab; Nasopharyngeal(NP) swabs in vial transport medium  Result Value Ref Range Status   SARS Coronavirus 2 by RT PCR NEGATIVE NEGATIVE Final    Comment: (NOTE) SARS-CoV-2 target nucleic acids are NOT DETECTED.  The SARS-CoV-2 RNA is generally detectable in upper respiratory specimens during  the acute phase of infection. The lowest concentration of SARS-CoV-2 viral copies this assay can detect is 138 copies/mL. A negative result does not preclude SARS-Cov-2 infection and should not be used as the sole basis for treatment or other patient management decisions. A negative result may occur with  improper specimen collection/handling, submission of specimen other than nasopharyngeal swab, presence of viral mutation(s) within the areas targeted by this assay, and inadequate number of viral copies(<138 copies/mL). A negative result must be combined  with clinical observations, patient history, and epidemiological information. The expected result is Negative.  Fact Sheet for Patients:  EntrepreneurPulse.com.au  Fact Sheet for Healthcare Providers:  IncredibleEmployment.be  This test is no t yet approved or cleared by the [place] FDA and  has been authorized for detection and/or diagnosis of SARS-CoV-2 by FDA under an Emergency Use Authorization (EUA). This EUA will remain  in effect (meaning this test can be used) for the duration of the COVID-19 declaration under Section 564(b)(1) of the Act, 21 U.S.C.section 360bbb-3(b)(1), unless the authorization is terminated  or revoked sooner.       Influenza A by PCR NEGATIVE NEGATIVE Final   Influenza B by PCR NEGATIVE NEGATIVE Final    Comment: (NOTE) The Xpert Xpress SARS-CoV-2/FLU/RSV plus assay is intended as an aid in the diagnosis of influenza from Nasopharyngeal swab specimens and should not be used as a sole basis for treatment. Nasal washings and aspirates are unacceptable for Xpert Xpress SARS-CoV-2/FLU/RSV testing.  Fact Sheet for Patients: EntrepreneurPulse.com.au  Fact Sheet for Healthcare Providers: IncredibleEmployment.be  This test is not yet approved or cleared by the [place] FDA and has been authorized for detection and/or diagnosis of SARS-CoV-2 by FDA under an Emergency Use Authorization (EUA). This EUA will remain in effect (meaning this test can be used) for the duration of the COVID-19 declaration under Section 564(b)(1) of the Act, 21 U.S.C. section 360bbb-3(b)(1), unless the authorization is terminated or revoked.  Performed at [facility], [address] [place], [place] [address]      Labs: Basic Metabolic Panel: Recent Labs  Lab 10/25/20 0909 10/26/20 1552 10/27/20 0432 10/28/20 0700 10/29/20 0555  NA 136 136 135 133* 134*  K 3.6 3.0* 3.6 4.5 3.9  CL 99  100 98 101 100  CO2 '29 28 29 29 29  ' GLUCOSE 120* 98 96 91 110*  BUN '9 10 9 ' 7* 7*  CREATININE 0.94 1.03 1.00 0.89 0.81  CALCIUM 8.8* 9.1 8.5* 8.4* 8.8*  MG  --   --  2.1  --   --    Liver Function Tests: Recent Labs  Lab 10/24/20 0658 10/25/20 0909 10/26/20 1552 10/27/20 0432  AST '15 15 24 20  ' ALT '17 12 16 12  ' ALKPHOS 120 121 129* 98  BILITOT 0.9 0.8 0.7 0.6  PROT 7.1 6.8 8.2* 6.8  ALBUMIN 3.0* 2.9* 3.5 3.0*   Recent Labs  Lab 10/24/20 0658 10/26/20 1552 10/28/20 0700  LIPASE 46 119* 66*   CBC: Recent Labs  Lab 10/25/20 0909 10/26/20 1552 10/27/20 0432 10/28/20 0700 10/29/20 0555  WBC 11.7* 12.4* 10.9* 10.0 9.1  HGB 14.1 14.1 12.7* 12.1* 11.5*  HCT 42.7 44.2 40.3 36.9* 35.4*  MCV 96.0 99.8 98.8 98.4 96.5  PLT 229 290 269 271 268    BNP (last 3 results) Recent Labs    10/24/20 1401  BNP 341.7*    Signed:  Barton Dubois MD.  [facility] 10/29/2020, 11:48 AM

## 2020-11-27 ENCOUNTER — Other Ambulatory Visit: Payer: Self-pay

## 2020-11-27 ENCOUNTER — Emergency Department (HOSPITAL_COMMUNITY)
Admission: EM | Admit: 2020-11-27 | Discharge: 2020-11-27 | Disposition: A | Discharge status: Home / Self Care | Payer: No Typology Code available for payment source | Attending: Emergency Medicine | Admitting: Emergency Medicine

## 2020-11-27 ENCOUNTER — Encounter (HOSPITAL_COMMUNITY): Payer: Self-pay

## 2020-11-27 ENCOUNTER — Emergency Department (HOSPITAL_COMMUNITY): Payer: No Typology Code available for payment source

## 2020-11-27 DIAGNOSIS — R1013 Epigastric pain: Secondary | ICD-10-CM | POA: Diagnosis present

## 2020-11-27 DIAGNOSIS — K219 Gastro-esophageal reflux disease without esophagitis: Secondary | ICD-10-CM | POA: Diagnosis not present

## 2020-11-27 DIAGNOSIS — R059 Cough, unspecified: Secondary | ICD-10-CM | POA: Insufficient documentation

## 2020-11-27 DIAGNOSIS — Z20822 Contact with and (suspected) exposure to covid-19: Secondary | ICD-10-CM | POA: Insufficient documentation

## 2020-11-27 DIAGNOSIS — Z79899 Other long term (current) drug therapy: Secondary | ICD-10-CM | POA: Diagnosis not present

## 2020-11-27 DIAGNOSIS — I130 Hypertensive heart and chronic kidney disease with heart failure and stage 1 through stage 4 chronic kidney disease, or unspecified chronic kidney disease: Secondary | ICD-10-CM | POA: Insufficient documentation

## 2020-11-27 DIAGNOSIS — J449 Chronic obstructive pulmonary disease, unspecified: Secondary | ICD-10-CM | POA: Diagnosis not present

## 2020-11-27 DIAGNOSIS — Z7951 Long term (current) use of inhaled steroids: Secondary | ICD-10-CM | POA: Insufficient documentation

## 2020-11-27 DIAGNOSIS — I5042 Chronic combined systolic (congestive) and diastolic (congestive) heart failure: Secondary | ICD-10-CM | POA: Diagnosis not present

## 2020-11-27 DIAGNOSIS — Z87891 Personal history of nicotine dependence: Secondary | ICD-10-CM | POA: Insufficient documentation

## 2020-11-27 DIAGNOSIS — R748 Abnormal levels of other serum enzymes: Secondary | ICD-10-CM | POA: Diagnosis not present

## 2020-11-27 DIAGNOSIS — N183 Chronic kidney disease, stage 3 unspecified: Secondary | ICD-10-CM | POA: Diagnosis not present

## 2020-11-27 DIAGNOSIS — R0602 Shortness of breath: Secondary | ICD-10-CM | POA: Diagnosis not present

## 2020-11-27 DIAGNOSIS — R0689 Other abnormalities of breathing: Secondary | ICD-10-CM | POA: Diagnosis not present

## 2020-11-27 DIAGNOSIS — I509 Heart failure, unspecified: Secondary | ICD-10-CM

## 2020-11-27 DIAGNOSIS — R7989 Other specified abnormal findings of blood chemistry: Secondary | ICD-10-CM | POA: Insufficient documentation

## 2020-11-27 DIAGNOSIS — K860 Alcohol-induced chronic pancreatitis: Secondary | ICD-10-CM | POA: Insufficient documentation

## 2020-11-27 DIAGNOSIS — R0902 Hypoxemia: Secondary | ICD-10-CM | POA: Diagnosis not present

## 2020-11-27 DIAGNOSIS — R1084 Generalized abdominal pain: Secondary | ICD-10-CM | POA: Diagnosis not present

## 2020-11-27 LAB — RESP PANEL BY RT-PCR (FLU A&B, COVID) ARPGX2
Influenza A by PCR: NEGATIVE
Influenza B by PCR: NEGATIVE
SARS Coronavirus 2 by RT PCR: NEGATIVE

## 2020-11-27 LAB — CBC WITH DIFFERENTIAL/PLATELET
Abs Immature Granulocytes: 0.04 10*3/uL (ref 0.00–0.07)
Basophils Absolute: 0 10*3/uL (ref 0.0–0.1)
Basophils Relative: 0 %
Eosinophils Absolute: 0.2 10*3/uL (ref 0.0–0.5)
Eosinophils Relative: 1 %
HCT: 36.4 % — ABNORMAL LOW (ref 39.0–52.0)
Hemoglobin: 11.8 g/dL — ABNORMAL LOW (ref 13.0–17.0)
Immature Granulocytes: 0 %
Lymphocytes Relative: 10 %
Lymphs Abs: 1.1 10*3/uL (ref 0.7–4.0)
MCH: 30.6 pg (ref 26.0–34.0)
MCHC: 32.4 g/dL (ref 30.0–36.0)
MCV: 94.5 fL (ref 80.0–100.0)
Monocytes Absolute: 0.8 10*3/uL (ref 0.1–1.0)
Monocytes Relative: 7 %
Neutro Abs: 8.9 10*3/uL — ABNORMAL HIGH (ref 1.7–7.7)
Neutrophils Relative %: 82 %
Platelets: 366 10*3/uL (ref 150–400)
RBC: 3.85 MIL/uL — ABNORMAL LOW (ref 4.22–5.81)
RDW: 17.4 % — ABNORMAL HIGH (ref 11.5–15.5)
WBC: 11 10*3/uL — ABNORMAL HIGH (ref 4.0–10.5)
nRBC: 0 % (ref 0.0–0.2)

## 2020-11-27 LAB — COMPREHENSIVE METABOLIC PANEL
ALT: 12 U/L (ref 0–44)
AST: 17 U/L (ref 15–41)
Albumin: 3 g/dL — ABNORMAL LOW (ref 3.5–5.0)
Alkaline Phosphatase: 105 U/L (ref 38–126)
Anion gap: 11 (ref 5–15)
BUN: 11 mg/dL (ref 8–23)
CO2: 23 mmol/L (ref 22–32)
Calcium: 8.3 mg/dL — ABNORMAL LOW (ref 8.9–10.3)
Chloride: 102 mmol/L (ref 98–111)
Creatinine, Ser: 1.08 mg/dL (ref 0.61–1.24)
GFR, Estimated: >60 mL/min (ref >60)
Glucose, Bld: 86 mg/dL (ref 70–99)
Potassium: 3.8 mmol/L (ref 3.5–5.1)
Sodium: 136 mmol/L (ref 135–145)
Total Bilirubin: 0.5 mg/dL (ref 0.3–1.2)
Total Protein: 6.9 g/dL (ref 6.5–8.1)

## 2020-11-27 LAB — LIPASE, BLOOD: Lipase: 128 U/L — ABNORMAL HIGH (ref 11–51)

## 2020-11-27 LAB — BRAIN NATRIURETIC PEPTIDE: B Natriuretic Peptide: 1647 pg/mL — ABNORMAL HIGH (ref 0.0–100.0)

## 2020-11-27 MED ORDER — PANTOPRAZOLE SODIUM 40 MG IV SOLR
40.0000 mg | Freq: Once | INTRAVENOUS | Status: AC
  Administered 2020-11-27: 40 mg via INTRAVENOUS
  Filled 2020-11-27: qty 40

## 2020-11-27 MED ORDER — FUROSEMIDE 10 MG/ML IJ SOLN
40.0000 mg | Freq: Once | INTRAMUSCULAR | Status: AC
  Administered 2020-11-27: 40 mg via INTRAVENOUS
  Filled 2020-11-27: qty 4

## 2020-11-27 MED ORDER — FUROSEMIDE 40 MG PO TABS: 40.0000 mg | ORAL_TABLET | Freq: Two times a day (BID) | ORAL | 0 refills | Status: DC

## 2020-11-27 MED ORDER — HYDROMORPHONE HCL 1 MG/ML IJ SOLN
1.0000 mg | Freq: Once | INTRAMUSCULAR | Status: AC
Start: 2020-11-27 — End: 2020-11-27
  Administered 2020-11-27: 1 mg via INTRAVENOUS
  Filled 2020-11-27: qty 1

## 2020-11-27 MED ORDER — OXYCODONE-ACETAMINOPHEN 10-325 MG PO TABS: 1.0000 | ORAL_TABLET | Freq: Three times a day (TID) | ORAL | 0 refills | Status: DC | PRN
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
## 2020-11-27 NOTE — Discharge Instructions (Addendum)
Your symptoms today are likely related to both your pancreatitis as well as your congestive heart failure. But you are not requiring admission to the hospital.   I am prescribing you a course of pain medication for the next few days as well as an increased dose of lasix. You should take 40 mg of lasix twice daily for the next 4 days. It is important you follow up with your primary doctor to recheck your lab work in the next week.

## 2020-11-27 NOTE — ED Triage Notes (Signed)
Pt arrived via EMS. Hx of pancreatitis. Complaining of abdominal pain, episode has been going on for 3 months. Recently admitted for same. Abdomen distended and tender all over.

## 2020-11-29 ENCOUNTER — Encounter (HOSPITAL_COMMUNITY): Payer: Self-pay

## 2020-11-29 ENCOUNTER — Inpatient Hospital Stay (HOSPITAL_COMMUNITY)
Admission: EM | Admit: 2020-11-29 | Discharge: 2020-12-07 | DRG: 438 | Disposition: A | Discharge status: Home / Self Care | Payer: No Typology Code available for payment source | Attending: Internal Medicine | Admitting: Internal Medicine

## 2020-11-29 ENCOUNTER — Inpatient Hospital Stay (HOSPITAL_COMMUNITY): Payer: No Typology Code available for payment source

## 2020-11-29 ENCOUNTER — Other Ambulatory Visit: Payer: Self-pay

## 2020-11-29 ENCOUNTER — Emergency Department (HOSPITAL_COMMUNITY): Payer: No Typology Code available for payment source

## 2020-11-29 DIAGNOSIS — E860 Dehydration: Secondary | ICD-10-CM | POA: Diagnosis present

## 2020-11-29 DIAGNOSIS — J440 Chronic obstructive pulmonary disease with acute lower respiratory infection: Secondary | ICD-10-CM | POA: Diagnosis present

## 2020-11-29 DIAGNOSIS — I130 Hypertensive heart and chronic kidney disease with heart failure and stage 1 through stage 4 chronic kidney disease, or unspecified chronic kidney disease: Secondary | ICD-10-CM | POA: Diagnosis present

## 2020-11-29 DIAGNOSIS — K852 Alcohol induced acute pancreatitis without necrosis or infection: Secondary | ICD-10-CM | POA: Diagnosis not present

## 2020-11-29 DIAGNOSIS — F1910 Other psychoactive substance abuse, uncomplicated: Secondary | ICD-10-CM | POA: Diagnosis present

## 2020-11-29 DIAGNOSIS — Z20822 Contact with and (suspected) exposure to covid-19: Secondary | ICD-10-CM | POA: Diagnosis present

## 2020-11-29 DIAGNOSIS — K838 Other specified diseases of biliary tract: Secondary | ICD-10-CM

## 2020-11-29 DIAGNOSIS — J410 Simple chronic bronchitis: Secondary | ICD-10-CM

## 2020-11-29 DIAGNOSIS — K5900 Constipation, unspecified: Secondary | ICD-10-CM | POA: Diagnosis present

## 2020-11-29 DIAGNOSIS — K863 Pseudocyst of pancreas: Secondary | ICD-10-CM

## 2020-11-29 DIAGNOSIS — R0902 Hypoxemia: Secondary | ICD-10-CM | POA: Diagnosis not present

## 2020-11-29 DIAGNOSIS — K859 Acute pancreatitis without necrosis or infection, unspecified: Secondary | ICD-10-CM | POA: Diagnosis not present

## 2020-11-29 DIAGNOSIS — D509 Iron deficiency anemia, unspecified: Secondary | ICD-10-CM | POA: Diagnosis present

## 2020-11-29 DIAGNOSIS — I5022 Chronic systolic (congestive) heart failure: Secondary | ICD-10-CM | POA: Diagnosis present

## 2020-11-29 DIAGNOSIS — R059 Cough, unspecified: Secondary | ICD-10-CM | POA: Diagnosis not present

## 2020-11-29 DIAGNOSIS — I428 Other cardiomyopathies: Secondary | ICD-10-CM | POA: Diagnosis not present

## 2020-11-29 DIAGNOSIS — N182 Chronic kidney disease, stage 2 (mild): Secondary | ICD-10-CM | POA: Diagnosis not present

## 2020-11-29 DIAGNOSIS — K853 Drug induced acute pancreatitis without necrosis or infection: Secondary | ICD-10-CM | POA: Diagnosis present

## 2020-11-29 DIAGNOSIS — G8921 Chronic pain due to trauma: Secondary | ICD-10-CM | POA: Diagnosis present

## 2020-11-29 DIAGNOSIS — Z823 Family history of stroke: Secondary | ICD-10-CM

## 2020-11-29 DIAGNOSIS — Z8249 Family history of ischemic heart disease and other diseases of the circulatory system: Secondary | ICD-10-CM

## 2020-11-29 DIAGNOSIS — J441 Chronic obstructive pulmonary disease with (acute) exacerbation: Secondary | ICD-10-CM | POA: Diagnosis present

## 2020-11-29 DIAGNOSIS — M62830 Muscle spasm of back: Secondary | ICD-10-CM | POA: Diagnosis present

## 2020-11-29 DIAGNOSIS — I10 Essential (primary) hypertension: Secondary | ICD-10-CM | POA: Diagnosis present

## 2020-11-29 DIAGNOSIS — J209 Acute bronchitis, unspecified: Secondary | ICD-10-CM | POA: Diagnosis present

## 2020-11-29 DIAGNOSIS — J449 Chronic obstructive pulmonary disease, unspecified: Secondary | ICD-10-CM | POA: Diagnosis present

## 2020-11-29 DIAGNOSIS — Z79899 Other long term (current) drug therapy: Secondary | ICD-10-CM | POA: Diagnosis not present

## 2020-11-29 DIAGNOSIS — R52 Pain, unspecified: Secondary | ICD-10-CM | POA: Diagnosis not present

## 2020-11-29 DIAGNOSIS — D649 Anemia, unspecified: Secondary | ICD-10-CM | POA: Diagnosis not present

## 2020-11-29 DIAGNOSIS — Z66 Do not resuscitate: Secondary | ICD-10-CM | POA: Diagnosis present

## 2020-11-29 DIAGNOSIS — I517 Cardiomegaly: Secondary | ICD-10-CM | POA: Diagnosis not present

## 2020-11-29 DIAGNOSIS — K8689 Other specified diseases of pancreas: Secondary | ICD-10-CM | POA: Diagnosis not present

## 2020-11-29 DIAGNOSIS — K219 Gastro-esophageal reflux disease without esophagitis: Secondary | ICD-10-CM | POA: Diagnosis present

## 2020-11-29 DIAGNOSIS — R0600 Dyspnea, unspecified: Secondary | ICD-10-CM | POA: Diagnosis not present

## 2020-11-29 DIAGNOSIS — F17210 Nicotine dependence, cigarettes, uncomplicated: Secondary | ICD-10-CM | POA: Diagnosis present

## 2020-11-29 DIAGNOSIS — J189 Pneumonia, unspecified organism: Secondary | ICD-10-CM | POA: Diagnosis present

## 2020-11-29 DIAGNOSIS — F17200 Nicotine dependence, unspecified, uncomplicated: Secondary | ICD-10-CM

## 2020-11-29 DIAGNOSIS — F1010 Alcohol abuse, uncomplicated: Secondary | ICD-10-CM | POA: Diagnosis present

## 2020-11-29 DIAGNOSIS — K860 Alcohol-induced chronic pancreatitis: Secondary | ICD-10-CM | POA: Diagnosis present

## 2020-11-29 DIAGNOSIS — R8271 Bacteriuria: Secondary | ICD-10-CM | POA: Diagnosis present

## 2020-11-29 DIAGNOSIS — R1084 Generalized abdominal pain: Secondary | ICD-10-CM | POA: Diagnosis not present

## 2020-11-29 DIAGNOSIS — K828 Other specified diseases of gallbladder: Secondary | ICD-10-CM | POA: Diagnosis not present

## 2020-11-29 DIAGNOSIS — K861 Other chronic pancreatitis: Secondary | ICD-10-CM | POA: Diagnosis not present

## 2020-11-29 DIAGNOSIS — S3282XA Multiple fractures of pelvis without disruption of pelvic ring, initial encounter for closed fracture: Secondary | ICD-10-CM | POA: Diagnosis not present

## 2020-11-29 DIAGNOSIS — N200 Calculus of kidney: Secondary | ICD-10-CM | POA: Diagnosis present

## 2020-11-29 DIAGNOSIS — Z91048 Other nonmedicinal substance allergy status: Secondary | ICD-10-CM

## 2020-11-29 DIAGNOSIS — Z833 Family history of diabetes mellitus: Secondary | ICD-10-CM

## 2020-11-29 DIAGNOSIS — Y900 Blood alcohol level of less than 20 mg/100 ml: Secondary | ICD-10-CM | POA: Diagnosis present

## 2020-11-29 DIAGNOSIS — J9811 Atelectasis: Secondary | ICD-10-CM | POA: Diagnosis not present

## 2020-11-29 DIAGNOSIS — R1310 Dysphagia, unspecified: Secondary | ICD-10-CM | POA: Diagnosis present

## 2020-11-29 DIAGNOSIS — S72002A Fracture of unspecified part of neck of left femur, initial encounter for closed fracture: Secondary | ICD-10-CM | POA: Diagnosis not present

## 2020-11-29 DIAGNOSIS — R109 Unspecified abdominal pain: Secondary | ICD-10-CM | POA: Diagnosis not present

## 2020-11-29 LAB — COMPREHENSIVE METABOLIC PANEL
ALT: 12 U/L (ref 0–44)
AST: 15 U/L (ref 15–41)
Albumin: 2.9 g/dL — ABNORMAL LOW (ref 3.5–5.0)
Alkaline Phosphatase: 209 U/L — ABNORMAL HIGH (ref 38–126)
Anion gap: 11 (ref 5–15)
BUN: 10 mg/dL (ref 8–23)
CO2: 26 mmol/L (ref 22–32)
Calcium: 8.3 mg/dL — ABNORMAL LOW (ref 8.9–10.3)
Chloride: 98 mmol/L (ref 98–111)
Creatinine, Ser: 0.95 mg/dL (ref 0.61–1.24)
GFR, Estimated: >60 mL/min (ref >60)
Glucose, Bld: 96 mg/dL (ref 70–99)
Potassium: 3.7 mmol/L (ref 3.5–5.1)
Sodium: 135 mmol/L (ref 135–145)
Total Bilirubin: 0.9 mg/dL (ref 0.3–1.2)
Total Protein: 7.2 g/dL (ref 6.5–8.1)

## 2020-11-29 LAB — CBC WITH DIFFERENTIAL/PLATELET
Abs Immature Granulocytes: 0.05 10*3/uL (ref 0.00–0.07)
Basophils Absolute: 0 10*3/uL (ref 0.0–0.1)
Basophils Relative: 0 %
Eosinophils Absolute: 0.1 10*3/uL (ref 0.0–0.5)
Eosinophils Relative: 1 %
HCT: 36.8 % — ABNORMAL LOW (ref 39.0–52.0)
Hemoglobin: 12 g/dL — ABNORMAL LOW (ref 13.0–17.0)
Immature Granulocytes: 0 %
Lymphocytes Relative: 6 %
Lymphs Abs: 0.7 10*3/uL (ref 0.7–4.0)
MCH: 30.3 pg (ref 26.0–34.0)
MCHC: 32.6 g/dL (ref 30.0–36.0)
MCV: 92.9 fL (ref 80.0–100.0)
Monocytes Absolute: 0.7 10*3/uL (ref 0.1–1.0)
Monocytes Relative: 6 %
Neutro Abs: 10.4 10*3/uL — ABNORMAL HIGH (ref 1.7–7.7)
Neutrophils Relative %: 87 %
Platelets: 306 10*3/uL (ref 150–400)
RBC: 3.96 MIL/uL — ABNORMAL LOW (ref 4.22–5.81)
RDW: 16.7 % — ABNORMAL HIGH (ref 11.5–15.5)
WBC: 12 10*3/uL — ABNORMAL HIGH (ref 4.0–10.5)
nRBC: 0 % (ref 0.0–0.2)

## 2020-11-29 LAB — ECHOCARDIOGRAM COMPLETE
AR max vel: 3.23 cm2
AV Area VTI: 3.28 cm2
AV Area mean vel: 2.89 cm2
AV Mean grad: 3 mmHg
AV Peak grad: 4.8 mmHg
Ao pk vel: 1.09 m/s
Area-P 1/2: 5.42 cm2
Calc EF: 22.1 %
Height: 71 in
MV VTI: 4.62 cm2
S' Lateral: 6.5 cm
Single Plane A2C EF: 17.4 %
Single Plane A4C EF: 27.6 %
Weight: 3008 oz

## 2020-11-29 LAB — URINALYSIS, ROUTINE W REFLEX MICROSCOPIC
Bilirubin Urine: NEGATIVE
Glucose, UA: NEGATIVE mg/dL
Ketones, ur: 20 mg/dL — AB
Nitrite: POSITIVE — AB
Protein, ur: 30 mg/dL — AB
Specific Gravity, Urine: >1.046 — ABNORMAL HIGH (ref 1.005–1.030)
WBC, UA: >50 WBC/hpf — ABNORMAL HIGH (ref 0–5)
pH: 6 (ref 5.0–8.0)

## 2020-11-29 LAB — PROCALCITONIN: Procalcitonin: 0.1 ng/mL

## 2020-11-29 LAB — LIPASE, BLOOD: Lipase: 87 U/L — ABNORMAL HIGH (ref 11–51)

## 2020-11-29 LAB — RESP PANEL BY RT-PCR (FLU A&B, COVID) ARPGX2
Influenza A by PCR: NEGATIVE
Influenza B by PCR: NEGATIVE
SARS Coronavirus 2 by RT PCR: NEGATIVE

## 2020-11-29 LAB — ETHANOL: Alcohol, Ethyl (B): <10 mg/dL (ref <10)

## 2020-11-29 MED ORDER — GABAPENTIN 300 MG PO CAPS
300.0000 mg | ORAL_CAPSULE | Freq: Two times a day (BID) | ORAL | Status: DC
  Administered 2020-11-29 – 2020-12-07 (×17): 300 mg via ORAL
  Filled 2020-11-29 (×13): qty 1
  Filled 2020-11-29: qty 3
  Filled 2020-11-29 (×3): qty 1

## 2020-11-29 MED ORDER — ACETAMINOPHEN 325 MG PO TABS: 650.0000 mg | ORAL_TABLET | Freq: Four times a day (QID) | ORAL | Status: DC | PRN

## 2020-11-29 MED ORDER — LACTATED RINGERS IV SOLN: INTRAVENOUS | Status: DC

## 2020-11-29 MED ORDER — LACTATED RINGERS IV SOLN: INTRAVENOUS | Status: AC

## 2020-11-29 MED ORDER — PANTOPRAZOLE SODIUM 40 MG PO TBEC
40.0000 mg | DELAYED_RELEASE_TABLET | Freq: Every day | ORAL | Status: DC
  Administered 2020-11-29 – 2020-12-07 (×9): 40 mg via ORAL
  Filled 2020-11-29 (×9): qty 1

## 2020-11-29 MED ORDER — BISMUTH SUBSALICYLATE 262 MG/15ML PO SUSP
30.0000 mL | Freq: Four times a day (QID) | ORAL | Status: DC | PRN
  Filled 2020-11-29: qty 118

## 2020-11-29 MED ORDER — BISACODYL 5 MG PO TBEC
5.0000 mg | DELAYED_RELEASE_TABLET | Freq: Every day | ORAL | Status: DC | PRN
  Administered 2020-12-01: 5 mg via ORAL
  Filled 2020-11-29: qty 1

## 2020-11-29 MED ORDER — BUDESONIDE 0.5 MG/2ML IN SUSP
0.5000 mg | Freq: Two times a day (BID) | RESPIRATORY_TRACT | Status: DC
  Administered 2020-11-29 – 2020-12-07 (×16): 0.5 mg via RESPIRATORY_TRACT
  Filled 2020-11-29 (×16): qty 2

## 2020-11-29 MED ORDER — LACTATED RINGERS IV BOLUS
500.0000 mL | Freq: Once | INTRAVENOUS | Status: AC
  Administered 2020-11-29: 500 mL via INTRAVENOUS

## 2020-11-29 MED ORDER — GUAIFENESIN 100 MG/5ML PO SOLN
5.0000 mL | ORAL | Status: DC | PRN
  Administered 2020-11-30 – 2020-12-05 (×7): 100 mg via ORAL
  Filled 2020-11-29: qty 25
  Filled 2020-11-29 (×6): qty 5

## 2020-11-29 MED ORDER — IPRATROPIUM-ALBUTEROL 0.5-2.5 (3) MG/3ML IN SOLN
3.0000 mL | RESPIRATORY_TRACT | Status: DC | PRN
  Administered 2020-12-06 – 2020-12-07 (×2): 3 mL via RESPIRATORY_TRACT
  Filled 2020-11-29 (×2): qty 3

## 2020-11-29 MED ORDER — LORAZEPAM 2 MG/ML IJ SOLN: 1.0000 mg | INTRAMUSCULAR | Status: AC | PRN

## 2020-11-29 MED ORDER — POLYETHYLENE GLYCOL 3350 17 G PO PACK
17.0000 g | PACK | Freq: Every day | ORAL | Status: DC | PRN
  Administered 2020-12-01: 17 g via ORAL
  Filled 2020-11-29: qty 1

## 2020-11-29 MED ORDER — AZITHROMYCIN 250 MG PO TABS
500.0000 mg | ORAL_TABLET | Freq: Every day | ORAL | Status: AC
  Administered 2020-11-29 – 2020-12-03 (×5): 500 mg via ORAL
  Filled 2020-11-29 (×5): qty 2

## 2020-11-29 MED ORDER — DIGOXIN 125 MCG PO TABS: 0.1250 mg | ORAL_TABLET | Freq: Every day | ORAL | Status: DC | PRN

## 2020-11-29 MED ORDER — MORPHINE SULFATE (PF) 4 MG/ML IV SOLN
4.0000 mg | Freq: Once | INTRAVENOUS | Status: AC
Start: 2020-11-29 — End: 2020-11-29
  Administered 2020-11-29: 4 mg via INTRAVENOUS
  Filled 2020-11-29: qty 1

## 2020-11-29 MED ORDER — ONDANSETRON HCL 4 MG/2ML IJ SOLN
4.0000 mg | Freq: Once | INTRAMUSCULAR | Status: AC
  Administered 2020-11-29: 4 mg via INTRAVENOUS
  Filled 2020-11-29: qty 2

## 2020-11-29 MED ORDER — THIAMINE HCL 100 MG PO TABS
100.0000 mg | ORAL_TABLET | Freq: Every day | ORAL | Status: DC
  Administered 2020-11-29 – 2020-12-07 (×9): 100 mg via ORAL
  Filled 2020-11-29 (×9): qty 1

## 2020-11-29 MED ORDER — SODIUM CHLORIDE 0.9 % IV SOLN
1.0000 g | INTRAVENOUS | Status: DC
  Administered 2020-11-29 – 2020-12-04 (×6): 1 g via INTRAVENOUS
  Filled 2020-11-29 (×6): qty 10

## 2020-11-29 MED ORDER — GADOBUTROL 1 MMOL/ML IV SOLN
10.0000 mL | Freq: Once | INTRAVENOUS | Status: AC | PRN
  Administered 2020-11-29: 10 mL via INTRAVENOUS

## 2020-11-29 MED ORDER — IOHEXOL 350 MG/ML SOLN
80.0000 mL | Freq: Once | INTRAVENOUS | Status: AC | PRN
  Administered 2020-11-29: 80 mL via INTRAVENOUS

## 2020-11-29 MED ORDER — ENOXAPARIN SODIUM 40 MG/0.4ML IJ SOSY
40.0000 mg | PREFILLED_SYRINGE | INTRAMUSCULAR | Status: DC
  Administered 2020-11-29: 40 mg via SUBCUTANEOUS
  Filled 2020-11-29 (×2): qty 0.4

## 2020-11-29 MED ORDER — PANCRELIPASE (LIP-PROT-AMYL) 12000-38000 UNITS PO CPEP
24000.0000 [IU] | ORAL_CAPSULE | Freq: Three times a day (TID) | ORAL | Status: DC
  Administered 2020-11-29 – 2020-12-01 (×7): 24000 [IU] via ORAL
  Filled 2020-11-29 (×12): qty 2

## 2020-11-29 MED ORDER — SENNOSIDES-DOCUSATE SODIUM 8.6-50 MG PO TABS: 1.0000 | ORAL_TABLET | Freq: Every evening | ORAL | Status: DC | PRN

## 2020-11-29 MED ORDER — MORPHINE SULFATE (PF) 4 MG/ML IV SOLN
4.0000 mg | INTRAVENOUS | Status: DC | PRN
  Administered 2020-11-29 – 2020-12-07 (×23): 4 mg via INTRAVENOUS
  Filled 2020-11-29 (×23): qty 1

## 2020-11-29 MED ORDER — TRAZODONE HCL 50 MG PO TABS
50.0000 mg | ORAL_TABLET | Freq: Every evening | ORAL | Status: DC | PRN
  Administered 2020-11-30 – 2020-12-05 (×5): 50 mg via ORAL
  Filled 2020-11-29 (×5): qty 1

## 2020-11-29 MED ORDER — MORPHINE SULFATE (PF) 4 MG/ML IV SOLN
4.0000 mg | Freq: Once | INTRAVENOUS | Status: AC
  Administered 2020-11-29: 4 mg via INTRAVENOUS
  Filled 2020-11-29: qty 1

## 2020-11-29 MED ORDER — LORAZEPAM 1 MG PO TABS: 1.0000 mg | ORAL_TABLET | ORAL | Status: AC | PRN

## 2020-11-29 MED ORDER — THIAMINE HCL 100 MG/ML IJ SOLN
100.0000 mg | Freq: Every day | INTRAMUSCULAR | Status: DC
  Filled 2020-11-29 (×4): qty 2

## 2020-11-29 MED ORDER — CARVEDILOL 3.125 MG PO TABS: 3.1250 mg | ORAL_TABLET | ORAL | Status: DC

## 2020-11-29 MED ORDER — ADULT MULTIVITAMIN W/MINERALS CH
1.0000 | ORAL_TABLET | Freq: Every day | ORAL | Status: DC
  Administered 2020-11-29 – 2020-12-07 (×8): 1 via ORAL
  Filled 2020-11-29 (×8): qty 1

## 2020-11-29 MED ORDER — PREDNISONE 20 MG PO TABS
40.0000 mg | ORAL_TABLET | Freq: Every day | ORAL | Status: AC
  Administered 2020-11-29 – 2020-12-03 (×5): 40 mg via ORAL
  Filled 2020-11-29 (×5): qty 2

## 2020-11-29 MED ORDER — HYDRALAZINE HCL 20 MG/ML IJ SOLN: 10.0000 mg | INTRAMUSCULAR | Status: DC | PRN

## 2020-11-29 MED ORDER — ONDANSETRON HCL 4 MG/2ML IJ SOLN
4.0000 mg | Freq: Four times a day (QID) | INTRAMUSCULAR | Status: DC | PRN
  Administered 2020-12-03 – 2020-12-07 (×2): 4 mg via INTRAVENOUS
  Filled 2020-11-29 (×2): qty 2

## 2020-11-29 MED ORDER — ONDANSETRON HCL 4 MG PO TABS: 4.0000 mg | ORAL_TABLET | Freq: Four times a day (QID) | ORAL | Status: DC | PRN

## 2020-11-29 MED ORDER — IPRATROPIUM-ALBUTEROL 0.5-2.5 (3) MG/3ML IN SOLN
3.0000 mL | Freq: Four times a day (QID) | RESPIRATORY_TRACT | Status: DC
  Administered 2020-11-29 – 2020-11-30 (×6): 3 mL via RESPIRATORY_TRACT
  Filled 2020-11-29 (×6): qty 3

## 2020-11-29 MED ORDER — OXYCODONE-ACETAMINOPHEN 5-325 MG PO TABS
2.0000 | ORAL_TABLET | Freq: Three times a day (TID) | ORAL | Status: DC | PRN
  Administered 2020-11-29 – 2020-12-06 (×11): 2 via ORAL
  Filled 2020-11-29 (×12): qty 2

## 2020-11-29 MED ORDER — FOLIC ACID 1 MG PO TABS
1.0000 mg | ORAL_TABLET | Freq: Every day | ORAL | Status: DC
  Administered 2020-11-29 – 2020-12-07 (×9): 1 mg via ORAL
  Filled 2020-11-29 (×9): qty 1
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
## 2020-11-29 NOTE — Plan of Care (Signed)
  Problem: Education: Goal: Knowledge of General Education information will improve Description: Including pain rating scale, medication(s)/side effects and non-pharmacologic comfort measures Outcome: Progressing   Problem: Health Behavior/Discharge Planning: Goal: Ability to manage health-related needs will improve Outcome: Progressing   Problem: Clinical Measurements: Goal: Ability to maintain clinical measurements within normal limits will improve Outcome: Progressing Goal: Will remain free from infection Outcome: Progressing Goal: Diagnostic test results will improve Outcome: Progressing Goal: Respiratory complications will improve Outcome: Progressing Goal: Cardiovascular complication will be avoided Outcome: Progressing   Problem: Activity: Goal: Risk for activity intolerance will decrease Outcome: Progressing   Problem: Activity: Goal: Risk for activity intolerance will decrease Outcome: Progressing   Problem: Nutrition: Goal: Adequate nutrition will be maintained Outcome: Progressing   Problem: Coping: Goal: Level of anxiety will decrease Outcome: Progressing   Problem: Elimination: Goal: Will not experience complications related to bowel motility Outcome: Progressing Goal: Will not experience complications related to urinary retention Outcome: Progressing   Problem: Pain Managment: Goal: General experience of comfort will improve Outcome: Progressing   Problem: Safety: Goal: Ability to remain free from injury will improve Outcome: Progressing   Problem: Skin Integrity: Goal: Risk for impaired skin integrity will decrease Outcome: Progressing   Problem: Education: Goal: Knowledge of Pancreatitis treatment and prevention will improve Outcome: Progressing   Problem: Health Behavior/Discharge Planning: Goal: Ability to formulate a plan to maintain an alcohol-free life will improve Outcome: Progressing   Problem: Nutritional: Goal: Ability to  achieve adequate nutritional intake will improve Outcome: Progressing   Problem: Clinical Measurements: Goal: Complications related to the disease process, condition or treatment will be avoided or minimized Outcome: Progressing
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
## 2020-11-29 NOTE — Progress Notes (Signed)
*  PRELIMINARY RESULTS* Echocardiogram 2D Echocardiogram has been performed.  Elpidio Anis 11/29/2020, 3:45 PM

## 2020-11-29 NOTE — ED Triage Notes (Signed)
Pt bib ems for abd pain.  Hx of pancreatitis.  Pt d/c yesterday for same.  Denies vomiting.
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
## 2020-11-30 DIAGNOSIS — J410 Simple chronic bronchitis: Secondary | ICD-10-CM | POA: Diagnosis not present

## 2020-11-30 DIAGNOSIS — K863 Pseudocyst of pancreas: Secondary | ICD-10-CM

## 2020-11-30 DIAGNOSIS — K8689 Other specified diseases of pancreas: Secondary | ICD-10-CM | POA: Diagnosis not present

## 2020-11-30 DIAGNOSIS — J189 Pneumonia, unspecified organism: Secondary | ICD-10-CM

## 2020-11-30 DIAGNOSIS — N182 Chronic kidney disease, stage 2 (mild): Secondary | ICD-10-CM | POA: Diagnosis not present

## 2020-11-30 DIAGNOSIS — K838 Other specified diseases of biliary tract: Secondary | ICD-10-CM | POA: Diagnosis not present

## 2020-11-30 DIAGNOSIS — K859 Acute pancreatitis without necrosis or infection, unspecified: Secondary | ICD-10-CM | POA: Diagnosis not present

## 2020-11-30 DIAGNOSIS — D649 Anemia, unspecified: Secondary | ICD-10-CM | POA: Diagnosis not present

## 2020-11-30 DIAGNOSIS — R52 Pain, unspecified: Secondary | ICD-10-CM

## 2020-11-30 DIAGNOSIS — I5022 Chronic systolic (congestive) heart failure: Secondary | ICD-10-CM | POA: Diagnosis not present

## 2020-11-30 LAB — CBC
HCT: 32.7 % — ABNORMAL LOW (ref 39.0–52.0)
Hemoglobin: 10.5 g/dL — ABNORMAL LOW (ref 13.0–17.0)
MCH: 29.9 pg (ref 26.0–34.0)
MCHC: 32.1 g/dL (ref 30.0–36.0)
MCV: 93.2 fL (ref 80.0–100.0)
Platelets: 274 10*3/uL (ref 150–400)
RBC: 3.51 MIL/uL — ABNORMAL LOW (ref 4.22–5.81)
RDW: 16.8 % — ABNORMAL HIGH (ref 11.5–15.5)
WBC: 8.6 10*3/uL (ref 4.0–10.5)
nRBC: 0 % (ref 0.0–0.2)

## 2020-11-30 LAB — LIPID PANEL
Cholesterol: 113 mg/dL (ref 0–200)
HDL: 13 mg/dL — ABNORMAL LOW (ref >40)
LDL Cholesterol: 81 mg/dL (ref 0–99)
Total CHOL/HDL Ratio: 8.7 RATIO
Triglycerides: 93 mg/dL (ref <150)
VLDL: 19 mg/dL (ref 0–40)

## 2020-11-30 LAB — FOLATE: Folate: 14.4 ng/mL (ref >5.9)

## 2020-11-30 LAB — IRON AND TIBC
Iron: 15 ug/dL — ABNORMAL LOW (ref 45–182)
Saturation Ratios: 6 % — ABNORMAL LOW (ref 17.9–39.5)
TIBC: 271 ug/dL (ref 250–450)
UIBC: 256 ug/dL

## 2020-11-30 LAB — COMPREHENSIVE METABOLIC PANEL
ALT: 10 U/L (ref 0–44)
AST: 12 U/L — ABNORMAL LOW (ref 15–41)
Albumin: 2.4 g/dL — ABNORMAL LOW (ref 3.5–5.0)
Alkaline Phosphatase: 161 U/L — ABNORMAL HIGH (ref 38–126)
Anion gap: 7 (ref 5–15)
BUN: 9 mg/dL (ref 8–23)
CO2: 28 mmol/L (ref 22–32)
Calcium: 7.9 mg/dL — ABNORMAL LOW (ref 8.9–10.3)
Chloride: 99 mmol/L (ref 98–111)
Creatinine, Ser: 0.83 mg/dL (ref 0.61–1.24)
GFR, Estimated: >60 mL/min (ref >60)
Glucose, Bld: 115 mg/dL — ABNORMAL HIGH (ref 70–99)
Potassium: 4.1 mmol/L (ref 3.5–5.1)
Sodium: 134 mmol/L — ABNORMAL LOW (ref 135–145)
Total Bilirubin: 0.7 mg/dL (ref 0.3–1.2)
Total Protein: 6.4 g/dL — ABNORMAL LOW (ref 6.5–8.1)

## 2020-11-30 LAB — FERRITIN: Ferritin: 211 ng/mL (ref 24–336)

## 2020-11-30 LAB — BRAIN NATRIURETIC PEPTIDE: B Natriuretic Peptide: 531 pg/mL — ABNORMAL HIGH (ref 0.0–100.0)

## 2020-11-30 LAB — VITAMIN B12: Vitamin B-12: 1127 pg/mL — ABNORMAL HIGH (ref 180–914)

## 2020-11-30 LAB — MAGNESIUM: Magnesium: 2 mg/dL (ref 1.7–2.4)

## 2020-11-30 MED ORDER — SENNOSIDES-DOCUSATE SODIUM 8.6-50 MG PO TABS
1.0000 | ORAL_TABLET | Freq: Every day | ORAL | Status: DC
  Administered 2020-11-30 – 2020-12-06 (×6): 1 via ORAL
  Filled 2020-11-30 (×7): qty 1

## 2020-11-30 MED ORDER — FERROUS SULFATE 325 (65 FE) MG PO TABS
325.0000 mg | ORAL_TABLET | Freq: Every day | ORAL | Status: DC
  Administered 2020-12-01 – 2020-12-07 (×7): 325 mg via ORAL
  Filled 2020-11-30 (×7): qty 1

## 2020-11-30 NOTE — Clinical Social Work Note (Signed)
[facility] notification done

## 2020-11-30 NOTE — Progress Notes (Signed)
PROGRESS NOTE    Ryan [name]  CVE:[id number] DOB: 14-Feb-1951 DOA: 11/29/2020 PCP: [facility]   Brief Narrative:  70 year old with history of tobacco use, alcohol use, CKD stage II, systolic CHF EF 02%, recurrent pancreatitis, GERD, polysubstance abuse admitted for abdominal pain.  He also reported of shortness of breath and cough.  He was diagnosed with acute pancreatitis and COPD exacerbation and admitted to the hospital.  GI team was consulted.  MRCP performed showed possible pseudocyst.  Conservative management was planned with outpatient EUS.  Repeat echocardiogram in the hospital showed EF of 15-20%.   Assessment & Plan:   Principal Problem:   Acute pancreatitis Active Problems:   Essential hypertension   Tobacco use disorder   CKD (chronic kidney disease), stage II   COPD (chronic obstructive pulmonary disease) (HCC)   Chronic HFrEF (heart failure with reduced ejection fraction) (HCC)   Pain   Pancreatic pseudocyst   Dilation of biliary tract   Dilation of pancreatic duct   Anemia   Acute recurrent alcoholic pancreatitis - CT shows possible concerns of complicated pancreatitis.  We will consult GI for their input.  Continue clear liquid diet.  Gentle hydration with caution.  Pain control, bowel regimen.  MRCP showed pseudocyst but no active necrosis.  Plans for outpatient EUS.   Acute respiratory distress Acute bronchitis with community-acquired pneumonia Acute COPD exacerbation -Likely from COPD exacerbation.  Procalcitonin 0.1. -Empiric IV Rocephin and azithromycin.  Prednisone day 2 of 5 - Supplemental oxygen.  I-S/flutter - Scheduled and as needed bronchodilators   Acute congestive heart failure with reduced ejection fraction, EF 15%, class II -Repeat echocardiogram shows EF of 15%.  Hydration with caution.  Holding off on Lasix.  Appreciate cardiology help in arranging outpatient follow-up appointment with CHF team  Asymptomatic bacteriuria - He  is already on Rocephin.  Follow-up urine cultures   EtOH Abuse -Alcohol withdrawal protocol.  Thiamine, folic acid and multivitamin   GERD -PPI      DVT prophylaxis: enoxaparin (LOVENOX) injection 40 mg Start: 11/29/20 1345 Code Status: Lovenox Family Communication:    Status is: Inpatient  Remains inpatient appropriate because:Inpatient level of care appropriate due to severity of illness.  Patient is still not tolerating orals, currently on clear liquid diet.  Maintain hospital stay  Dispo: The patient is from: Home              Anticipated d/c is to: Home              Patient currently is not medically stable to d/c.   Difficult to place patient No       Subjective: Tells me his breathing is little better today and his abdominal pain is subsiding.  Denies any dysuria.  Review of Systems Otherwise negative except as per HPI, including: General: Denies fever, chills, night sweats or unintended weight loss. Resp: Denies cough, wheezing, shortness of breath. Cardiac: Denies chest pain, palpitations, orthopnea, paroxysmal nocturnal dyspnea. GI: Denies abdominal pain, nausea, vomiting, diarrhea or constipation GU: Denies dysuria, frequency, hesitancy or incontinence MS: Denies muscle aches, joint pain or swelling Neuro: Denies headache, neurologic deficits (focal weakness, numbness, tingling), abnormal gait Psych: Denies anxiety, depression, SI/HI/AVH Skin: Denies new rashes or lesions ID: Denies sick contacts, exotic exposures, travel  Examination:  Constitutional: Not in acute distress Respiratory: Diffuse bilateral rhonchi, improved from yesterday Cardiovascular: Normal sinus rhythm, no rubs Abdomen: Nontender nondistended good bowel sounds Musculoskeletal: No edema noted Skin: No rashes seen Neurologic: CN  2-12 grossly intact.  And nonfocal Psychiatric: Normal judgment and insight. Alert and oriented x 3. Normal mood.   Objective: Vitals:   11/30/20 0401  11/30/20 0405 11/30/20 0700 11/30/20 0718  BP: 124/85     Pulse: 90     Resp: 20     Temp: 97.8 F (36.6 C)     TempSrc:      SpO2: 99% 97%  97%  Weight:   83.2 kg   Height:        Intake/Output Summary (Last 24 hours) at 11/30/2020 1030 Last data filed at 11/30/2020 0900 Gross per 24 hour  Intake 900 ml  Output --  Net 900 ml   Filed Weights   11/29/20 0834 11/30/20 0700  Weight: 85.3 kg 83.2 kg     Data Reviewed:   CBC: Recent Labs  Lab 11/27/20 0944 11/29/20 0921 11/30/20 0503  WBC 11.0* 12.0* 8.6  NEUTROABS 8.9* 10.4*  --   HGB 11.8* 12.0* 10.5*  HCT 36.4* 36.8* 32.7*  MCV 94.5 92.9 93.2  PLT 366 306 384   Basic Metabolic Panel: Recent Labs  Lab 11/27/20 0944 11/29/20 0921 11/30/20 0503  NA 136 135 134*  K 3.8 3.7 4.1  CL 102 98 99  CO2 23 26 28   GLUCOSE 86 96 115*  BUN 11 10 9   CREATININE 1.08 0.95 0.83  CALCIUM 8.3* 8.3* 7.9*  MG  --   --  2.0   GFR: Estimated Creatinine Clearance: 86.9 mL/min (by C-G formula based on SCr of 0.83 mg/dL). Liver Function Tests: Recent Labs  Lab 11/27/20 0944 11/29/20 0921 11/30/20 0503  AST 17 15 12*  ALT 12 12 10   ALKPHOS 105 209* 161*  BILITOT 0.5 0.9 0.7  PROT 6.9 7.2 6.4*  ALBUMIN 3.0* 2.9* 2.4*   Recent Labs  Lab 11/27/20 0944 11/29/20 0921  LIPASE 128* 87*   No results for input(s): AMMONIA in the last 168 hours. Coagulation Profile: No results for input(s): INR, PROTIME in the last 168 hours. Cardiac Enzymes: No results for input(s): CKTOTAL, CKMB, CKMBINDEX, TROPONINI in the last 168 hours. BNP (last 3 results) No results for input(s): PROBNP in the last 8760 hours. HbA1C: No results for input(s): HGBA1C in the last 72 hours. CBG: No results for input(s): GLUCAP in the last 168 hours. Lipid Profile: Recent Labs    11/30/20 0503  CHOL 113  HDL 13*  LDLCALC 81  TRIG 93  CHOLHDL 8.7   Thyroid Function Tests: No results for input(s): TSH, T4TOTAL, FREET4, T3FREE, THYROIDAB in the  last 72 hours. Anemia Panel: No results for input(s): VITAMINB12, FOLATE, FERRITIN, TIBC, IRON, RETICCTPCT in the last 72 hours. Sepsis Labs: Recent Labs  Lab 11/29/20 0921  PROCALCITON 0.10    Recent Results (from the past 240 hour(s))  Resp Panel by RT-PCR (Flu A&B, Covid) Nasopharyngeal Swab     Status: None   Collection Time: 11/27/20 10:27 AM   Specimen: Nasopharyngeal Swab; Nasopharyngeal(NP) swabs in vial transport medium  Result Value Ref Range Status   SARS Coronavirus 2 by RT PCR NEGATIVE NEGATIVE Final    Comment: (NOTE) SARS-CoV-2 target nucleic acids are NOT DETECTED.  The SARS-CoV-2 RNA is generally detectable in upper respiratory specimens during the acute phase of infection. The lowest concentration of SARS-CoV-2 viral copies this assay can detect is 138 copies/mL. A negative result does not preclude SARS-Cov-2 infection and should not be used as the sole basis for treatment or other patient management decisions. A negative  result may occur with  improper specimen collection/handling, submission of specimen other than nasopharyngeal swab, presence of viral mutation(s) within the areas targeted by this assay, and inadequate number of viral copies(<138 copies/mL). A negative result must be combined with clinical observations, patient history, and epidemiological information. The expected result is Negative.  Fact Sheet for Patients:  EntrepreneurPulse.com.au  Fact Sheet for Healthcare Providers:  IncredibleEmployment.be  This test is no t yet approved or cleared by the [place] FDA and  has been authorized for detection and/or diagnosis of SARS-CoV-2 by FDA under an Emergency Use Authorization (EUA). This EUA will remain  in effect (meaning this test can be used) for the duration of the COVID-19 declaration under Section 564(b)(1) of the Act, 21 U.S.C.section 360bbb-3(b)(1), unless the authorization is terminated  or  revoked sooner.       Influenza A by PCR NEGATIVE NEGATIVE Final   Influenza B by PCR NEGATIVE NEGATIVE Final    Comment: (NOTE) The Xpert Xpress SARS-CoV-2/FLU/RSV plus assay is intended as an aid in the diagnosis of influenza from Nasopharyngeal swab specimens and should not be used as a sole basis for treatment. Nasal washings and aspirates are unacceptable for Xpert Xpress SARS-CoV-2/FLU/RSV testing.  Fact Sheet for Patients: EntrepreneurPulse.com.au  Fact Sheet for Healthcare Providers: IncredibleEmployment.be  This test is not yet approved or cleared by the [place] FDA and has been authorized for detection and/or diagnosis of SARS-CoV-2 by FDA under an Emergency Use Authorization (EUA). This EUA will remain in effect (meaning this test can be used) for the duration of the COVID-19 declaration under Section 564(b)(1) of the Act, 21 U.S.C. section 360bbb-3(b)(1), unless the authorization is terminated or revoked.  Performed at [facility], [address], [place],  [address]   Resp Panel by RT-PCR (Flu A&B, Covid)     Status: None   Collection Time: 11/29/20 12:08 PM   Specimen: Nasopharyngeal(NP) swabs in vial transport medium  Result Value Ref Range Status   SARS Coronavirus 2 by RT PCR NEGATIVE NEGATIVE Final    Comment: (NOTE) SARS-CoV-2 target nucleic acids are NOT DETECTED.  The SARS-CoV-2 RNA is generally detectable in upper respiratory specimens during the acute phase of infection. The lowest concentration of SARS-CoV-2 viral copies this assay can detect is 138 copies/mL. A negative result does not preclude SARS-Cov-2 infection and should not be used as the sole basis for treatment or other patient management decisions. A negative result may occur with  improper specimen collection/handling, submission of specimen other than nasopharyngeal swab, presence of viral mutation(s) within the areas targeted by this  assay, and inadequate number of viral copies(<138 copies/mL). A negative result must be combined with clinical observations, patient history, and epidemiological information. The expected result is Negative.  Fact Sheet for Patients:  EntrepreneurPulse.com.au  Fact Sheet for Healthcare Providers:  IncredibleEmployment.be  This test is no t yet approved or cleared by the [place] FDA and  has been authorized for detection and/or diagnosis of SARS-CoV-2 by FDA under an Emergency Use Authorization (EUA). This EUA will remain  in effect (meaning this test can be used) for the duration of the COVID-19 declaration under Section 564(b)(1) of the Act, 21 U.S.C.section 360bbb-3(b)(1), unless the authorization is terminated  or revoked sooner.       Influenza A by PCR NEGATIVE NEGATIVE Final   Influenza B by PCR NEGATIVE NEGATIVE Final    Comment: (NOTE) The Xpert Xpress SARS-CoV-2/FLU/RSV plus assay is intended as an aid in the diagnosis  of influenza from Nasopharyngeal swab specimens and should not be used as a sole basis for treatment. Nasal washings and aspirates are unacceptable for Xpert Xpress SARS-CoV-2/FLU/RSV testing.  Fact Sheet for Patients: EntrepreneurPulse.com.au  Fact Sheet for Healthcare Providers: IncredibleEmployment.be  This test is not yet approved or cleared by the [place] FDA and has been authorized for detection and/or diagnosis of SARS-CoV-2 by FDA under an Emergency Use Authorization (EUA). This EUA will remain in effect (meaning this test can be used) for the duration of the COVID-19 declaration under Section 564(b)(1) of the Act, 21 U.S.C. section 360bbb-3(b)(1), unless the authorization is terminated or revoked.  Performed at [facility], [address], [place], [place] [address]          Radiology Studies: CT Abdomen Pelvis W Contrast  Result Date:  11/29/2020 CLINICAL DATA:  Persistent pancreatitis EXAM: CT ABDOMEN AND PELVIS WITH CONTRAST TECHNIQUE: Multidetector CT imaging of the abdomen and pelvis was performed using the standard protocol following bolus administration of intravenous contrast. Sagittal and coronal MPR images reconstructed from axial data set. CONTRAST:  80mL OMNIPAQUE IOHEXOL 350 MG/ML SOLN IV. No oral contrast. COMPARISON:  10/24/2020 FINDINGS: Lower chest: Minimal bibasilar atelectasis Hepatobiliary: Gallbladder distended with small amount of adjacent fluid, though ascites is seen elsewhere in the upper abdomen as well. No calcifications. Mild intrahepatic and extrahepatic biliary dilatation, with CBD measuring 11 mm Pancreas: Parenchymal atrophy and ductal dilatation. Surrounding edema. Multiple fluid collections consistent with pseudocysts. Fluid collection anterior to proximal pancreatic tail 5.6 x 3.6 cm image 22. Reverse C-shaped collection inferior to pancreatic body and proximal tail 7.3 x 5.3 cm image 34. Collection adjacent to pancreatic body 3.2 x 2.3 cm image 28. Small fluid collection anteriorly in upper abdomen 3.7 x 1.8 cm image 28, potentially communicating with the reverse C-shaped collection. Spleen: Normal appearance Adrenals/Urinary Tract: Adrenal glands normal appearance. Small BILATERAL renal cysts. Large calculus at inferior pole RIGHT kidney 16 x 12 mm image 39. Kidneys, ureters, and bladder otherwise normal appearance. Stomach/Bowel: Appendix not visualized. Diverticulosis of distal colon without evidence of diverticulitis. Stomach decompressed. Bowel loops otherwise unremarkable. Vascular/Lymphatic: Atherosclerotic calcifications aorta and iliac arteries without aneurysm. Enlargement of cardiac chambers. No adenopathy. Reproductive: Prostate gland normal size. Seminal vesicles unremarkable. Other: Scattered ascites.  No free air.  No hernia. Musculoskeletal: Old BILATERAL pelvic fractures. Old proximal LEFT  femoral fracture with deformity. IMPRESSION: Acute pancreatitis with multiple peripancreatic pseudocysts as above, majority new since the prior exam though these correspond in position with areas of inflammation/phlegmon seen on the previous exam. Mild biliary dilatation with CBD measuring 11 mm, recommend correlation with LFTs. Distal colonic diverticulosis without evidence of diverticulitis. Nonobstructing RIGHT renal calculus 16 x 12 mm. Aortic Atherosclerosis (ICD10-I70.0). Electronically Signed   By: Lavonia Dana M.D.   On: 11/29/2020 11:38   MR 3D Recon At Scanner  Result Date: 11/29/2020 CLINICAL DATA:  70 year old male with history of pancreatitis. Biliary ductal dilatation. EXAM: MRI ABDOMEN WITHOUT AND WITH CONTRAST (INCLUDING MRCP) TECHNIQUE: Multiplanar multisequence MR imaging of the abdomen was performed both before and after the administration of intravenous contrast. Heavily T2-weighted images of the biliary and pancreatic ducts were obtained, and three-dimensional MRCP images were rendered by post processing. CONTRAST:  51mL GADAVIST GADOBUTROL 1 MMOL/ML IV SOLN COMPARISON:  No prior abdominal MRI. CT the abdomen and pelvis 11/29/2020. FINDINGS: Lower chest: Unremarkable. Hepatobiliary: No suspicious cystic or solid hepatic lesions. There is mild intrahepatic biliary ductal dilatation. Common hepatic duct is dilated measuring  up to 15 mm in diameter. Common bile duct is mildly dilated measuring up to 8 mm in diameter. MRCP images are suboptimal but demonstrate a potential filling defect in the distal common bile duct estimated to measure approximately 5 mm (coronal image 56 of series 11). However, this cannot be confirmed on additional T2 weighted sequences. There are no filling defects within the gallbladder to suggest cholelithiasis. Gallbladder is moderately distended. Gallbladder wall thickness appears normal. Pancreas: Pancreatic ductal dilatation is again noted throughout the body and tail  of the pancreas, measuring up to 9 mm in the body. This ductal dilatation abruptly terminates in the region of the pancreatic head. In this region, no discrete pancreatic mass is confidently identified on today's examination. There is atrophy in the distal body and tail of the pancreas. Several complex fluid collections emanate from the pancreas and extend into the root of the small bowel mesentery, demonstrating heterogeneous signal intensity on T1 and T2 weighted images, compatible with pancreatic pseudocysts. The largest of these is complex in shape and therefore difficult to measure, but estimated measure approximately 6.2 x 3.0 x 5.5 cm (axial image 24 of series 4 and coronal image 10 of series 5). Spleen:  Unremarkable. Adrenals/Urinary Tract: Multiple small T1 hypointense, T2 hyperintense, nonenhancing lesions are noted in both kidneys, compatible with simple cysts, measuring 1.3 cm or less in size. No definite solid renal lesions are noted. No hydroureteronephrosis in the visualized portions of the abdomen. Bilateral adrenal glands are normal in appearance. Stomach/Bowel: Unremarkable. Vascular/Lymphatic: No aneurysm identified in the visualized abdominal vasculature. Splenic vein, superior mesenteric vein, splenoportal confluence and proximal portal vein are all narrowed secondary to mass effect from the acute pancreatitis. Other:  Small volume of ascites most evident adjacent to the liver. Musculoskeletal: No aggressive appearing osseous lesions are noted in the visualized portions of the skeleton. IMPRESSION: 1. MRCP images are suboptimal, but suggest the possibility of choledocholithiasis in the distal common bile duct. Notably, however, this could not be confirmed on additional T2 weighted sequences, and would not explain the persistent pancreatic ductal dilatation seen on today's examination. The possibility of a pancreatic head mass is considered, although no discrete mass is confidently identified on  today's examination which is confounded by the presence of acute pancreatitis with multiple complex pseudocysts, as discussed above. Electronically Signed   By: Vinnie Langton M.D.   On: 11/29/2020 20:59   DG Chest Port 1 View  Result Date: 11/29/2020 CLINICAL DATA:  Cough, dyspnea, abdominal pain, pancreatitis EXAM: PORTABLE CHEST 1 VIEW COMPARISON:  11/27/2020 chest radiograph. FINDINGS: Stable cardiomediastinal silhouette with mild cardiomegaly. No pneumothorax. No pleural effusion. Lungs appear clear, with no acute consolidative airspace disease and no pulmonary edema. IMPRESSION: Mild cardiomegaly without pulmonary edema. No active pulmonary disease. Electronically Signed   By: Ilona Sorrel M.D.   On: 11/29/2020 12:26   MR ABDOMEN MRCP W WO CONTAST  Result Date: 11/29/2020 CLINICAL DATA:  70 year old male with history of pancreatitis. Biliary ductal dilatation. EXAM: MRI ABDOMEN WITHOUT AND WITH CONTRAST (INCLUDING MRCP) TECHNIQUE: Multiplanar multisequence MR imaging of the abdomen was performed both before and after the administration of intravenous contrast. Heavily T2-weighted images of the biliary and pancreatic ducts were obtained, and three-dimensional MRCP images were rendered by post processing. CONTRAST:  38mL GADAVIST GADOBUTROL 1 MMOL/ML IV SOLN COMPARISON:  No prior abdominal MRI. CT the abdomen and pelvis 11/29/2020. FINDINGS: Lower chest: Unremarkable. Hepatobiliary: No suspicious cystic or solid hepatic lesions. There is mild intrahepatic biliary ductal  dilatation. Common hepatic duct is dilated measuring up to 15 mm in diameter. Common bile duct is mildly dilated measuring up to 8 mm in diameter. MRCP images are suboptimal but demonstrate a potential filling defect in the distal common bile duct estimated to measure approximately 5 mm (coronal image 56 of series 11). However, this cannot be confirmed on additional T2 weighted sequences. There are no filling defects within the  gallbladder to suggest cholelithiasis. Gallbladder is moderately distended. Gallbladder wall thickness appears normal. Pancreas: Pancreatic ductal dilatation is again noted throughout the body and tail of the pancreas, measuring up to 9 mm in the body. This ductal dilatation abruptly terminates in the region of the pancreatic head. In this region, no discrete pancreatic mass is confidently identified on today's examination. There is atrophy in the distal body and tail of the pancreas. Several complex fluid collections emanate from the pancreas and extend into the root of the small bowel mesentery, demonstrating heterogeneous signal intensity on T1 and T2 weighted images, compatible with pancreatic pseudocysts. The largest of these is complex in shape and therefore difficult to measure, but estimated measure approximately 6.2 x 3.0 x 5.5 cm (axial image 24 of series 4 and coronal image 10 of series 5). Spleen:  Unremarkable. Adrenals/Urinary Tract: Multiple small T1 hypointense, T2 hyperintense, nonenhancing lesions are noted in both kidneys, compatible with simple cysts, measuring 1.3 cm or less in size. No definite solid renal lesions are noted. No hydroureteronephrosis in the visualized portions of the abdomen. Bilateral adrenal glands are normal in appearance. Stomach/Bowel: Unremarkable. Vascular/Lymphatic: No aneurysm identified in the visualized abdominal vasculature. Splenic vein, superior mesenteric vein, splenoportal confluence and proximal portal vein are all narrowed secondary to mass effect from the acute pancreatitis. Other:  Small volume of ascites most evident adjacent to the liver. Musculoskeletal: No aggressive appearing osseous lesions are noted in the visualized portions of the skeleton. IMPRESSION: 1. MRCP images are suboptimal, but suggest the possibility of choledocholithiasis in the distal common bile duct. Notably, however, this could not be confirmed on additional T2 weighted sequences, and  would not explain the persistent pancreatic ductal dilatation seen on today's examination. The possibility of a pancreatic head mass is considered, although no discrete mass is confidently identified on today's examination which is confounded by the presence of acute pancreatitis with multiple complex pseudocysts, as discussed above. Electronically Signed   By: Vinnie Langton M.D.   On: 11/29/2020 20:59   ECHOCARDIOGRAM COMPLETE  Result Date: 11/29/2020    ECHOCARDIOGRAM REPORT   Patient Name:   Ryan [name] Date of Exam: 11/29/2020 Medical Rec #:  [id number]       Height:       71.0 in Accession #:    [id number]      Weight:       188.0 lb Date of Birth:  Jan 09, 1951       BSA:          2.054 m Patient Age:    60 years        BP:           148/83 mmHg Patient Gender: M               HR:           98 bpm. Exam Location:  [facility] Procedure: 2D Echo, Cardiac Doppler and Color Doppler Indications:    Cardiomyopathy  History:        Patient has prior history of Echocardiogram examinations, most  recent 10/16/2018. CHF, COPD; Risk Factors:Hypertension and                 Dyslipidemia. Nonischemic cardiomyopathy, Tobacco abuse.  Sonographer:    Wenda Low Referring Phys: [id number] Slate Debroux CHIRAG Rosary Filosa IMPRESSIONS  1. Global hypokinesis. The anterior, anteroseptal walls are akinetic. Marland Kitchen Left ventricular ejection fraction, by estimation, is 15-20%. The left ventricle has severely decreased function. The left ventricle demonstrates global hypokinesis. The left ventricular internal cavity size was severely dilated. Left ventricular diastolic parameters are indeterminate.  2. Right ventricular systolic function is normal. The right ventricular size is normal.  3. Left atrial size was severely dilated.  4. The mitral valve is normal in structure. Trivial mitral valve regurgitation. No evidence of mitral stenosis.  5. The aortic valve is tricuspid. Aortic valve regurgitation is not visualized. No aortic  stenosis is present. FINDINGS  Left Ventricle: Global hypokinesis. The anterior, anteroseptal walls are akinetic. Left ventricular ejection fraction, by estimation, is 15-20%. The left ventricle has severely decreased function. The left ventricle demonstrates global hypokinesis. The left ventricular internal cavity size was severely dilated. There is no left ventricular hypertrophy. Left ventricular diastolic parameters are indeterminate. Right Ventricle: The right ventricular size is normal. No increase in right ventricular wall thickness. Right ventricular systolic function is normal. Left Atrium: Left atrial size was severely dilated. Right Atrium: Right atrial size was normal in size. Pericardium: There is no evidence of pericardial effusion. Mitral Valve: The mitral valve is normal in structure. Trivial mitral valve regurgitation. No evidence of mitral valve stenosis. MV peak gradient, 3.1 mmHg. The mean mitral valve gradient is 1.0 mmHg. Tricuspid Valve: The tricuspid valve is not well visualized. Tricuspid valve regurgitation is not demonstrated. No evidence of tricuspid stenosis. Aortic Valve: The aortic valve is tricuspid. Aortic valve regurgitation is not visualized. No aortic stenosis is present. Aortic valve mean gradient measures 3.0 mmHg. Aortic valve peak gradient measures 4.8 mmHg. Aortic valve area, by VTI measures 3.28 cm. Pulmonic Valve: The pulmonic valve was not well visualized. Pulmonic valve regurgitation is not visualized. No evidence of pulmonic stenosis. Aorta: The aortic root is normal in size and structure. IAS/Shunts: The interatrial septum was not well visualized.  LEFT VENTRICLE PLAX 2D LVIDd:         7.00 cm      Diastology LVIDs:         6.50 cm      LV e' medial:    4.68 cm/s LV PW:         1.10 cm      LV E/e' medial:  17.8 LV IVS:        0.90 cm      LV e' lateral:   10.40 cm/s LVOT diam:     2.00 cm      LV E/e' lateral: 8.0 LV SV:         58 LV SV Index:   28 LVOT Area:      3.14 cm  LV Volumes (MOD) LV vol d, MOD A2C: 288.0 ml LV vol d, MOD A4C: 275.0 ml LV vol s, MOD A2C: 238.0 ml LV vol s, MOD A4C: 199.0 ml LV SV MOD A2C:     50.0 ml LV SV MOD A4C:     275.0 ml LV SV MOD BP:      62.3 ml RIGHT VENTRICLE RV Basal diam:  3.55 cm RV Mid diam:    3.10 cm RV S prime:     13.50 cm/s  TAPSE (M-mode): 1.8 cm LEFT ATRIUM              Index       RIGHT ATRIUM           Index LA diam:        4.30 cm  2.09 cm/m  RA Area:     19.30 cm LA Vol (A2C):   113.0 ml 55.02 ml/m RA Volume:   54.80 ml  26.68 ml/m LA Vol (A4C):   126.0 ml 61.35 ml/m LA Biplane Vol: 124.0 ml 60.37 ml/m  AORTIC VALVE                   PULMONIC VALVE AV Area (Vmax):    3.23 cm    PV Vmax:       0.80 m/s AV Area (Vmean):   2.89 cm    PV Peak grad:  2.6 mmHg AV Area (VTI):     3.28 cm AV Vmax:           109.00 cm/s AV Vmean:          74.400 cm/s AV VTI:            0.176 m AV Peak Grad:      4.8 mmHg AV Mean Grad:      3.0 mmHg LVOT Vmax:         112.00 cm/s LVOT Vmean:        68.400 cm/s LVOT VTI:          0.184 m LVOT/AV VTI ratio: 1.05  AORTA Ao Root diam: 3.60 cm Ao Asc diam:  3.50 cm MITRAL VALVE MV Area (PHT): 5.42 cm    SHUNTS MV Area VTI:   4.62 cm    Systemic VTI:  0.18 m MV Peak grad:  3.1 mmHg    Systemic Diam: 2.00 cm MV Mean grad:  1.0 mmHg MV Vmax:       0.88 m/s MV Vmean:      52.5 cm/s MV Decel Time: 140 msec MV E velocity: 83.10 cm/s MV A velocity: 62.10 cm/s MV E/A ratio:  1.34 Carlyle Dolly MD Electronically signed by Carlyle Dolly MD Signature Date/Time: 11/29/2020/4:25:37 PM    Final         Scheduled Meds:  azithromycin  500 mg Oral Daily   budesonide (PULMICORT) nebulizer solution  0.5 mg Nebulization BID   enoxaparin (LOVENOX) injection  40 mg Subcutaneous [id number]   folic acid  1 mg Oral Daily   gabapentin  300 mg Oral BID   ipratropium-albuterol  3 mL Nebulization Q6H   lipase/protease/amylase  24,000 Units Oral TID AC   multivitamin with minerals  1 tablet Oral Daily    pantoprazole  40 mg Oral Daily   predniSONE  40 mg Oral Q breakfast   thiamine  100 mg Oral Daily   Or   thiamine  100 mg Intravenous Daily   Continuous Infusions:  cefTRIAXone (ROCEPHIN)  IV 1 g (11/29/20 1612)   lactated ringers 50 mL/hr at 11/29/20 1610     LOS: 1 day   Time spent= 35 mins    Jonique Kulig Arsenio Loader, MD [facility]  If 7PM-7AM, please contact night-coverage  11/30/2020, 10:30 AM

## 2020-11-30 NOTE — Progress Notes (Signed)
Subjective: Feeling somewhat better today. Abdominal pain improving, nausea resolved. No vomiting. Would like to try clear liquids. No BM. Denies history of brbpr or melena. No NSAIDs.   Cough continues today but feels a little improved. Mild SOB improving. Hasn't required supplemental O2.   Objective: Vital signs in last 24 hours: Temp:  [97.8 F (36.6 C)-98.4 F (36.9 C)] 97.8 F (36.6 C) (09/20 0401) Pulse Rate:  [90-104] 90 (09/20 0401) Resp:  [20-28] 20 (09/20 0401) BP: (109-148)/(67-90) 124/85 (09/20 0401) SpO2:  [96 %-100 %] 97 % (09/20 0718) Weight:  [83.2 kg-85.3 kg] 83.2 kg (09/20 0700) Last BM Date: 11/28/20 General:   Alert and oriented, pleasant, resting in bed comfortably. Head:  Normocephalic and atraumatic. Eyes:  No icterus, sclera clear. Conjuctiva pink.   Abdomen:  Bowel sounds present, soft, non-distended.  Mild TTP across the upper abdomen, primarily epigastric.  No HSM or hernias noted. No rebound or guarding. No masses appreciated. Msk:  Symmetrical without gross deformities. Normal posture. Extremities:  Without edema. Neurologic:  Alert and  oriented x4;  grossly normal neurologically. Skin:  Warm and dry, intact without significant lesions.  Psych:  Normal mood and affect.  Intake/Output from previous day: 09/19 0701 - 09/20 0700 In: 500 [IV Piggyback:500] Out: -  Intake/Output this shift: No intake/output data recorded.  Lab Results: Recent Labs    11/27/20 0944 11/29/20 0921 11/30/20 0503  WBC 11.0* 12.0* 8.6  HGB 11.8* 12.0* 10.5*  HCT 36.4* 36.8* 32.7*  PLT 366 306 274   BMET Recent Labs    11/27/20 0944 11/29/20 0921 11/30/20 0503  NA 136 135 134*  K 3.8 3.7 4.1  CL 102 98 99  CO2 '23 26 28  ' GLUCOSE 86 96 115*  BUN '11 10 9  ' CREATININE 1.08 0.95 0.83  CALCIUM 8.3* 8.3* 7.9*   LFT Recent Labs    11/27/20 0944 11/29/20 0921 11/30/20 0503  PROT 6.9 7.2 6.4*  ALBUMIN 3.0* 2.9* 2.4*  AST 17 15 12*  ALT '12 12 10   ' ALKPHOS 105 209* 161*  BILITOT 0.5 0.9 0.7   Studies/Results: CT Abdomen Pelvis W Contrast  Result Date: 11/29/2020 CLINICAL DATA:  Persistent pancreatitis EXAM: CT ABDOMEN AND PELVIS WITH CONTRAST TECHNIQUE: Multidetector CT imaging of the abdomen and pelvis was performed using the standard protocol following bolus administration of intravenous contrast. Sagittal and coronal MPR images reconstructed from axial data set. CONTRAST:  41m OMNIPAQUE IOHEXOL 350 MG/ML SOLN IV. No oral contrast. COMPARISON:  10/24/2020 FINDINGS: Lower chest: Minimal bibasilar atelectasis Hepatobiliary: Gallbladder distended with small amount of adjacent fluid, though ascites is seen elsewhere in the upper abdomen as well. No calcifications. Mild intrahepatic and extrahepatic biliary dilatation, with CBD measuring 11 mm Pancreas: Parenchymal atrophy and ductal dilatation. Surrounding edema. Multiple fluid collections consistent with pseudocysts. Fluid collection anterior to proximal pancreatic tail 5.6 x 3.6 cm image 22. Reverse C-shaped collection inferior to pancreatic body and proximal tail 7.3 x 5.3 cm image 34. Collection adjacent to pancreatic body 3.2 x 2.3 cm image 28. Small fluid collection anteriorly in upper abdomen 3.7 x 1.8 cm image 28, potentially communicating with the reverse C-shaped collection. Spleen: Normal appearance Adrenals/Urinary Tract: Adrenal glands normal appearance. Small BILATERAL renal cysts. Large calculus at inferior pole RIGHT kidney 16 x 12 mm image 39. Kidneys, ureters, and bladder otherwise normal appearance. Stomach/Bowel: Appendix not visualized. Diverticulosis of distal colon without evidence of diverticulitis. Stomach decompressed. Bowel loops otherwise unremarkable. Vascular/Lymphatic: Atherosclerotic calcifications aorta  and iliac arteries without aneurysm. Enlargement of cardiac chambers. No adenopathy. Reproductive: Prostate gland normal size. Seminal vesicles unremarkable. Other:  Scattered ascites.  No free air.  No hernia. Musculoskeletal: Old BILATERAL pelvic fractures. Old proximal LEFT femoral fracture with deformity. IMPRESSION: Acute pancreatitis with multiple peripancreatic pseudocysts as above, majority new since the prior exam though these correspond in position with areas of inflammation/phlegmon seen on the previous exam. Mild biliary dilatation with CBD measuring 11 mm, recommend correlation with LFTs. Distal colonic diverticulosis without evidence of diverticulitis. Nonobstructing RIGHT renal calculus 16 x 12 mm. Aortic Atherosclerosis (ICD10-I70.0). Electronically Signed   By: Lavonia Dana M.D.   On: 11/29/2020 11:38   MR 3D Recon At Scanner  Result Date: 11/29/2020 CLINICAL DATA:  70 year old male with history of pancreatitis. Biliary ductal dilatation. EXAM: MRI ABDOMEN WITHOUT AND WITH CONTRAST (INCLUDING MRCP) TECHNIQUE: Multiplanar multisequence MR imaging of the abdomen was performed both before and after the administration of intravenous contrast. Heavily T2-weighted images of the biliary and pancreatic ducts were obtained, and three-dimensional MRCP images were rendered by post processing. CONTRAST:  27m GADAVIST GADOBUTROL 1 MMOL/ML IV SOLN COMPARISON:  No prior abdominal MRI. CT the abdomen and pelvis 11/29/2020. FINDINGS: Lower chest: Unremarkable. Hepatobiliary: No suspicious cystic or solid hepatic lesions. There is mild intrahepatic biliary ductal dilatation. Common hepatic duct is dilated measuring up to 15 mm in diameter. Common bile duct is mildly dilated measuring up to 8 mm in diameter. MRCP images are suboptimal but demonstrate a potential filling defect in the distal common bile duct estimated to measure approximately 5 mm (coronal image 56 of series 11). However, this cannot be confirmed on additional T2 weighted sequences. There are no filling defects within the gallbladder to suggest cholelithiasis. Gallbladder is moderately distended. Gallbladder  wall thickness appears normal. Pancreas: Pancreatic ductal dilatation is again noted throughout the body and tail of the pancreas, measuring up to 9 mm in the body. This ductal dilatation abruptly terminates in the region of the pancreatic head. In this region, no discrete pancreatic mass is confidently identified on today's examination. There is atrophy in the distal body and tail of the pancreas. Several complex fluid collections emanate from the pancreas and extend into the root of the small bowel mesentery, demonstrating heterogeneous signal intensity on T1 and T2 weighted images, compatible with pancreatic pseudocysts. The largest of these is complex in shape and therefore difficult to measure, but estimated measure approximately 6.2 x 3.0 x 5.5 cm (axial image 24 of series 4 and coronal image 10 of series 5). Spleen:  Unremarkable. Adrenals/Urinary Tract: Multiple small T1 hypointense, T2 hyperintense, nonenhancing lesions are noted in both kidneys, compatible with simple cysts, measuring 1.3 cm or less in size. No definite solid renal lesions are noted. No hydroureteronephrosis in the visualized portions of the abdomen. Bilateral adrenal glands are normal in appearance. Stomach/Bowel: Unremarkable. Vascular/Lymphatic: No aneurysm identified in the visualized abdominal vasculature. Splenic vein, superior mesenteric vein, splenoportal confluence and proximal portal vein are all narrowed secondary to mass effect from the acute pancreatitis. Other:  Small volume of ascites most evident adjacent to the liver. Musculoskeletal: No aggressive appearing osseous lesions are noted in the visualized portions of the skeleton. IMPRESSION: 1. MRCP images are suboptimal, but suggest the possibility of choledocholithiasis in the distal common bile duct. Notably, however, this could not be confirmed on additional T2 weighted sequences, and would not explain the persistent pancreatic ductal dilatation seen on today's  examination. The possibility of a pancreatic head  mass is considered, although no discrete mass is confidently identified on today's examination which is confounded by the presence of acute pancreatitis with multiple complex pseudocysts, as discussed above. Electronically Signed   By: Vinnie Langton M.D.   On: 11/29/2020 20:59   DG Chest Port 1 View  Result Date: 11/29/2020 CLINICAL DATA:  Cough, dyspnea, abdominal pain, pancreatitis EXAM: PORTABLE CHEST 1 VIEW COMPARISON:  11/27/2020 chest radiograph. FINDINGS: Stable cardiomediastinal silhouette with mild cardiomegaly. No pneumothorax. No pleural effusion. Lungs appear clear, with no acute consolidative airspace disease and no pulmonary edema. IMPRESSION: Mild cardiomegaly without pulmonary edema. No active pulmonary disease. Electronically Signed   By: Ilona Sorrel M.D.   On: 11/29/2020 12:26   MR ABDOMEN MRCP W WO CONTAST  Result Date: 11/29/2020 CLINICAL DATA:  70 year old male with history of pancreatitis. Biliary ductal dilatation. EXAM: MRI ABDOMEN WITHOUT AND WITH CONTRAST (INCLUDING MRCP) TECHNIQUE: Multiplanar multisequence MR imaging of the abdomen was performed both before and after the administration of intravenous contrast. Heavily T2-weighted images of the biliary and pancreatic ducts were obtained, and three-dimensional MRCP images were rendered by post processing. CONTRAST:  49m GADAVIST GADOBUTROL 1 MMOL/ML IV SOLN COMPARISON:  No prior abdominal MRI. CT the abdomen and pelvis 11/29/2020. FINDINGS: Lower chest: Unremarkable. Hepatobiliary: No suspicious cystic or solid hepatic lesions. There is mild intrahepatic biliary ductal dilatation. Common hepatic duct is dilated measuring up to 15 mm in diameter. Common bile duct is mildly dilated measuring up to 8 mm in diameter. MRCP images are suboptimal but demonstrate a potential filling defect in the distal common bile duct estimated to measure approximately 5 mm (coronal image 56 of series  11). However, this cannot be confirmed on additional T2 weighted sequences. There are no filling defects within the gallbladder to suggest cholelithiasis. Gallbladder is moderately distended. Gallbladder wall thickness appears normal. Pancreas: Pancreatic ductal dilatation is again noted throughout the body and tail of the pancreas, measuring up to 9 mm in the body. This ductal dilatation abruptly terminates in the region of the pancreatic head. In this region, no discrete pancreatic mass is confidently identified on today's examination. There is atrophy in the distal body and tail of the pancreas. Several complex fluid collections emanate from the pancreas and extend into the root of the small bowel mesentery, demonstrating heterogeneous signal intensity on T1 and T2 weighted images, compatible with pancreatic pseudocysts. The largest of these is complex in shape and therefore difficult to measure, but estimated measure approximately 6.2 x 3.0 x 5.5 cm (axial image 24 of series 4 and coronal image 10 of series 5). Spleen:  Unremarkable. Adrenals/Urinary Tract: Multiple small T1 hypointense, T2 hyperintense, nonenhancing lesions are noted in both kidneys, compatible with simple cysts, measuring 1.3 cm or less in size. No definite solid renal lesions are noted. No hydroureteronephrosis in the visualized portions of the abdomen. Bilateral adrenal glands are normal in appearance. Stomach/Bowel: Unremarkable. Vascular/Lymphatic: No aneurysm identified in the visualized abdominal vasculature. Splenic vein, superior mesenteric vein, splenoportal confluence and proximal portal vein are all narrowed secondary to mass effect from the acute pancreatitis. Other:  Small volume of ascites most evident adjacent to the liver. Musculoskeletal: No aggressive appearing osseous lesions are noted in the visualized portions of the skeleton. IMPRESSION: 1. MRCP images are suboptimal, but suggest the possibility of choledocholithiasis in  the distal common bile duct. Notably, however, this could not be confirmed on additional T2 weighted sequences, and would not explain the persistent pancreatic ductal dilatation seen on today's  examination. The possibility of a pancreatic head mass is considered, although no discrete mass is confidently identified on today's examination which is confounded by the presence of acute pancreatitis with multiple complex pseudocysts, as discussed above. Electronically Signed   By: Vinnie Langton M.D.   On: 11/29/2020 20:59   ECHOCARDIOGRAM COMPLETE  Result Date: 11/29/2020    ECHOCARDIOGRAM REPORT   Patient Name:   EZEKIEL MENZER Date of Exam: 11/29/2020 Medical Rec #:  [id number]       Height:       71.0 in Accession #:    [id number]      Weight:       188.0 lb Date of Birth:  1951/02/05       BSA:          2.054 m Patient Age:    69 years        BP:           148/83 mmHg Patient Gender: M               HR:           98 bpm. Exam Location:  [facility] Procedure: 2D Echo, Cardiac Doppler and Color Doppler Indications:    Cardiomyopathy  History:        Patient has prior history of Echocardiogram examinations, most                 recent 10/16/2018. CHF, COPD; Risk Factors:Hypertension and                 Dyslipidemia. Nonischemic cardiomyopathy, Tobacco abuse.  Sonographer:    Wenda Low Referring Phys: [id number] ANKIT CHIRAG AMIN IMPRESSIONS  1. Global hypokinesis. The anterior, anteroseptal walls are akinetic. Marland Kitchen Left ventricular ejection fraction, by estimation, is 15-20%. The left ventricle has severely decreased function. The left ventricle demonstrates global hypokinesis. The left ventricular internal cavity size was severely dilated. Left ventricular diastolic parameters are indeterminate.  2. Right ventricular systolic function is normal. The right ventricular size is normal.  3. Left atrial size was severely dilated.  4. The mitral valve is normal in structure. Trivial mitral valve regurgitation. No  evidence of mitral stenosis.  5. The aortic valve is tricuspid. Aortic valve regurgitation is not visualized. No aortic stenosis is present. FINDINGS  Left Ventricle: Global hypokinesis. The anterior, anteroseptal walls are akinetic. Left ventricular ejection fraction, by estimation, is 15-20%. The left ventricle has severely decreased function. The left ventricle demonstrates global hypokinesis. The left ventricular internal cavity size was severely dilated. There is no left ventricular hypertrophy. Left ventricular diastolic parameters are indeterminate. Right Ventricle: The right ventricular size is normal. No increase in right ventricular wall thickness. Right ventricular systolic function is normal. Left Atrium: Left atrial size was severely dilated. Right Atrium: Right atrial size was normal in size. Pericardium: There is no evidence of pericardial effusion. Mitral Valve: The mitral valve is normal in structure. Trivial mitral valve regurgitation. No evidence of mitral valve stenosis. MV peak gradient, 3.1 mmHg. The mean mitral valve gradient is 1.0 mmHg. Tricuspid Valve: The tricuspid valve is not well visualized. Tricuspid valve regurgitation is not demonstrated. No evidence of tricuspid stenosis. Aortic Valve: The aortic valve is tricuspid. Aortic valve regurgitation is not visualized. No aortic stenosis is present. Aortic valve mean gradient measures 3.0 mmHg. Aortic valve peak gradient measures 4.8 mmHg. Aortic valve area, by VTI measures 3.28 cm. Pulmonic Valve: The pulmonic valve was not well visualized. Pulmonic valve regurgitation is  not visualized. No evidence of pulmonic stenosis. Aorta: The aortic root is normal in size and structure. IAS/Shunts: The interatrial septum was not well visualized.  LEFT VENTRICLE PLAX 2D LVIDd:         7.00 cm      Diastology LVIDs:         6.50 cm      LV e' medial:    4.68 cm/s LV PW:         1.10 cm      LV E/e' medial:  17.8 LV IVS:        0.90 cm      LV e'  lateral:   10.40 cm/s LVOT diam:     2.00 cm      LV E/e' lateral: 8.0 LV SV:         58 LV SV Index:   28 LVOT Area:     3.14 cm  LV Volumes (MOD) LV vol d, MOD A2C: 288.0 ml LV vol d, MOD A4C: 275.0 ml LV vol s, MOD A2C: 238.0 ml LV vol s, MOD A4C: 199.0 ml LV SV MOD A2C:     50.0 ml LV SV MOD A4C:     275.0 ml LV SV MOD BP:      62.3 ml RIGHT VENTRICLE RV Basal diam:  3.55 cm RV Mid diam:    3.10 cm RV S prime:     13.50 cm/s TAPSE (M-mode): 1.8 cm LEFT ATRIUM              Index       RIGHT ATRIUM           Index LA diam:        4.30 cm  2.09 cm/m  RA Area:     19.30 cm LA Vol (A2C):   113.0 ml 55.02 ml/m RA Volume:   54.80 ml  26.68 ml/m LA Vol (A4C):   126.0 ml 61.35 ml/m LA Biplane Vol: 124.0 ml 60.37 ml/m  AORTIC VALVE                   PULMONIC VALVE AV Area (Vmax):    3.23 cm    PV Vmax:       0.80 m/s AV Area (Vmean):   2.89 cm    PV Peak grad:  2.6 mmHg AV Area (VTI):     3.28 cm AV Vmax:           109.00 cm/s AV Vmean:          74.400 cm/s AV VTI:            0.176 m AV Peak Grad:      4.8 mmHg AV Mean Grad:      3.0 mmHg LVOT Vmax:         112.00 cm/s LVOT Vmean:        68.400 cm/s LVOT VTI:          0.184 m LVOT/AV VTI ratio: 1.05  AORTA Ao Root diam: 3.60 cm Ao Asc diam:  3.50 cm MITRAL VALVE MV Area (PHT): 5.42 cm    SHUNTS MV Area VTI:   4.62 cm    Systemic VTI:  0.18 m MV Peak grad:  3.1 mmHg    Systemic Diam: 2.00 cm MV Mean grad:  1.0 mmHg MV Vmax:       0.88 m/s MV Vmean:      52.5 cm/s MV Decel Time: 140 msec MV E velocity: 83.10  cm/s MV A velocity: 62.10 cm/s MV E/A ratio:  1.34 Carlyle Dolly MD Electronically signed by Carlyle Dolly MD Signature Date/Time: 11/29/2020/4:25:37 PM    Final     Assessment: 70 y.o. year old male with history of CHF with EF 15-20%, CKD, polysubstance abuse, GERD, colonic tubular adenoma with localized high-grade dysplasia in December 2019 with recommendations to repeat in 3 years, recurrent suspected alcohol induced pancreatitis with multiple  admissions/presentations to the ED. Last admitted with acute pancreatitis in August 2022 and left AMA. Last presented to the ED on 9/17 with persistent epigastric abdominal pain. He was given pain medications, Protonix, and Lasix and improved for discharge.  He presented again to the emergency room 9/19 due to ongoing severe epigastric abdominal pain radiating to his back, not manageable with oxycodone.  He was found to have acute pancreatitis with peripancreatic pseudocyst, mild biliary and pancreatic ductal dilation. GI consulted for further evaluation. Also admitted with acute bronchitis/COPD exacerbation, and likely UTI, started on empiric antibiotics and prednisone.   Acute, recurrent pancreatitis: Significant epigastric abdominal pain and nausea for the last 2 months, some days worse than others, requiring recurrent admissions, most recently discharged about 2 weeks ago from the [place] per patient, and was tolerating mostly clear or full liquid diet at home. Now with acute worsening of symptoms without known precipitating factor.  Though lipase is only 87, CT suggest acute pancreatitis, multiple peripancreatic pseudocyst which likely reflect sequela of prior pancreatitis as well as biliary and pancreatic ductal dilation, and pancreatic atrophy.  Associated elevated alk phos at 209, improved to 161 today.  AST, ALT, total bilirubin remain within normal limits. Triglycerides 93 and patient denies any alcohol x2 months. Underwent MRI/MRCP revealing mild intrahepatic biliary duct dilation, common hepatic duct dilated up to 15 mm, common bile duct dilated up to 8 mm.  Suboptimal MRCP images, but a potential filling defect in the distal common bile duct approximately 5 mm, unable to confirm on other sequences.  No defects within the gallbladder to suggest cholelithiasis (prior US also negative in August).  Pancreatic duct dilation throughout the body and tail measuring up to 9 mm that abruptly terminates in the region  of the pancreatic head with no discrete pancreatic mass confidently identified.  Several complex fluid collections from the pancreas extending into the root of the small bowel mesentery compatible with pancreatic pseudocyst, largest at 6.2 x 3.0 x 5.5 cm. Notably, pancreatic and biliary ductal dilation have been present at least since November 2021 with concern for pancreatic head mass with possible duodenal involvement (per review of care everywhere).  There had been plans for EGD/EUS with FNA in January 2022 with [facility], but patient left AMA.  I suspect etiology of recurrent pancreatitis likely relates to biliary and pancreatic ductal dilation. Though MRCP with possible filling defect in the common bile duct, this could not be confirmed and he has no significant LFT elevation to suggest this. Malignancy remains of high concern. Additional considerations include drug induced pancreatitis with Lasix, alcohol induced though patient denies any alcohol in 2 months, and chronic tobacco use though also limiting this significantly.   He has been receiving gentle IV hydration in light of CHF with reduced EF. Cr, BUN, Hg, Hct all decreased today appropriately. Clinically he is feeling improved today with mild abdominal pain and no nausea. He is ready to try clear liquids.   I discussed case with Dr. Laural Golden who does not recommend ERCP at this time. Recommends EUS as previously  planned in January with [facility] to follow-up on biliary/pancreatic ductal dilation/recurrent pancreatitis to gain additional information/rule out malignancy.   Dysphagia: Patient reports chronic intermittent pill dysphagia to large pills. No solid food or liquid dysphagia. Chronically on PPI daily outpatient without any significant GERD symptoms. He would benefit from EGD outpatient with primary GI team.  Anemia: Hemoglobin 12.0 with normocytic indices on admission, fairly stable/slightly improved from hospital discharge on  8/19 with hemoglobin 11.5.  Hemoglobin on 8/14 was 15.0, though patient may have been hemoconcentrated.  Hemoglobin declined to 10.5 this morning, likely dilutional. He denies overt GI bleeding.  Denies NSAIDs.  No iron panel, B12, or folate in the system.  Last colonoscopy December 2019, due for repeat this year.  Last EGD November 2019 with congested duodenal mucosa.     Plan: Clear liquid diet. Continue gentle IV fluid hydration due to reduced EF. Monitor for signs/symptoms of fluid overload.  Analgesics and antiemetics per hospitalist. Continue PPI daily. Monitor LFTs daily. Check anemia panel.  He will need follow-up outpatient with primary GI team, [facility], for advanced endoscopy with EUS/FNA as previously recommended.   Would also benefit from EGD with possible dilation due to reported dysphagia.  This can also be completed outpatient by primary GI team.   LOS: 1 day    11/30/2020, 7:19 AM   Aliene Altes, [facility]

## 2020-12-01 ENCOUNTER — Inpatient Hospital Stay (HOSPITAL_COMMUNITY): Payer: No Typology Code available for payment source

## 2020-12-01 DIAGNOSIS — I5022 Chronic systolic (congestive) heart failure: Secondary | ICD-10-CM | POA: Diagnosis not present

## 2020-12-01 DIAGNOSIS — K859 Acute pancreatitis without necrosis or infection, unspecified: Secondary | ICD-10-CM | POA: Diagnosis not present

## 2020-12-01 DIAGNOSIS — K8689 Other specified diseases of pancreas: Secondary | ICD-10-CM | POA: Diagnosis not present

## 2020-12-01 DIAGNOSIS — K838 Other specified diseases of biliary tract: Secondary | ICD-10-CM | POA: Diagnosis not present

## 2020-12-01 DIAGNOSIS — J410 Simple chronic bronchitis: Secondary | ICD-10-CM | POA: Diagnosis not present

## 2020-12-01 DIAGNOSIS — N182 Chronic kidney disease, stage 2 (mild): Secondary | ICD-10-CM | POA: Diagnosis not present

## 2020-12-01 DIAGNOSIS — D649 Anemia, unspecified: Secondary | ICD-10-CM | POA: Diagnosis not present

## 2020-12-01 LAB — CBC
HCT: 22.4 % — ABNORMAL LOW (ref 39.0–52.0)
Hemoglobin: 7.1 g/dL — ABNORMAL LOW (ref 13.0–17.0)
MCH: 31.1 pg (ref 26.0–34.0)
MCHC: 31.7 g/dL (ref 30.0–36.0)
MCV: 98.2 fL (ref 80.0–100.0)
Platelets: 240 10*3/uL (ref 150–400)
RBC: 2.28 MIL/uL — ABNORMAL LOW (ref 4.22–5.81)
RDW: 17.4 % — ABNORMAL HIGH (ref 11.5–15.5)
WBC: 10.2 10*3/uL (ref 4.0–10.5)
nRBC: 0 % (ref 0.0–0.2)

## 2020-12-01 LAB — COMPREHENSIVE METABOLIC PANEL
ALT: 15 U/L (ref 0–44)
AST: 29 U/L (ref 15–41)
Albumin: 2.5 g/dL — ABNORMAL LOW (ref 3.5–5.0)
Alkaline Phosphatase: 150 U/L — ABNORMAL HIGH (ref 38–126)
Anion gap: 9 (ref 5–15)
BUN: 9 mg/dL (ref 8–23)
CO2: 25 mmol/L (ref 22–32)
Calcium: 8.3 mg/dL — ABNORMAL LOW (ref 8.9–10.3)
Chloride: 102 mmol/L (ref 98–111)
Creatinine, Ser: 0.76 mg/dL (ref 0.61–1.24)
GFR, Estimated: >60 mL/min (ref >60)
Glucose, Bld: 126 mg/dL — ABNORMAL HIGH (ref 70–99)
Potassium: 3.9 mmol/L (ref 3.5–5.1)
Sodium: 136 mmol/L (ref 135–145)
Total Bilirubin: 0.1 mg/dL — ABNORMAL LOW (ref 0.3–1.2)
Total Protein: 6.4 g/dL — ABNORMAL LOW (ref 6.5–8.1)

## 2020-12-01 LAB — HEMOGLOBIN AND HEMATOCRIT, BLOOD
HCT: 30.9 % — ABNORMAL LOW (ref 39.0–52.0)
Hemoglobin: 10.1 g/dL — ABNORMAL LOW (ref 13.0–17.0)

## 2020-12-01 LAB — URINE CULTURE

## 2020-12-01 LAB — MAGNESIUM: Magnesium: 2.2 mg/dL (ref 1.7–2.4)

## 2020-12-01 MED ORDER — IPRATROPIUM-ALBUTEROL 0.5-2.5 (3) MG/3ML IN SOLN
3.0000 mL | Freq: Two times a day (BID) | RESPIRATORY_TRACT | Status: DC
  Administered 2020-12-01 – 2020-12-07 (×12): 3 mL via RESPIRATORY_TRACT
  Filled 2020-12-01 (×12): qty 3

## 2020-12-01 MED ORDER — ENOXAPARIN SODIUM 40 MG/0.4ML IJ SOSY
40.0000 mg | PREFILLED_SYRINGE | INTRAMUSCULAR | Status: DC
  Filled 2020-12-01 (×4): qty 0.4

## 2020-12-01 MED ORDER — PANCRELIPASE (LIP-PROT-AMYL) 12000-38000 UNITS PO CPEP
36000.0000 [IU] | ORAL_CAPSULE | Freq: Three times a day (TID) | ORAL | Status: DC
  Administered 2020-12-02 – 2020-12-03 (×4): 36000 [IU] via ORAL
  Filled 2020-12-01 (×4): qty 3

## 2020-12-01 MED ORDER — SODIUM CHLORIDE 0.9% IV SOLUTION: Freq: Once | INTRAVENOUS | Status: AC

## 2020-12-01 MED ORDER — FUROSEMIDE 20 MG PO TABS
20.0000 mg | ORAL_TABLET | Freq: Every day | ORAL | Status: DC
  Administered 2020-12-01 – 2020-12-03 (×3): 20 mg via ORAL
  Filled 2020-12-01 (×3): qty 1

## 2020-12-01 MED ORDER — LACTULOSE 10 GM/15ML PO SOLN
20.0000 g | Freq: Three times a day (TID) | ORAL | Status: AC
  Administered 2020-12-01 (×2): 20 g via ORAL
  Filled 2020-12-01 (×2): qty 30

## 2020-12-01 MED ORDER — IPRATROPIUM-ALBUTEROL 0.5-2.5 (3) MG/3ML IN SOLN
3.0000 mL | Freq: Three times a day (TID) | RESPIRATORY_TRACT | Status: DC
  Administered 2020-12-01: 3 mL via RESPIRATORY_TRACT
  Filled 2020-12-01: qty 3

## 2020-12-01 NOTE — Progress Notes (Signed)
PROGRESS NOTE    Ryan [name]  SJG:[id number] DOB: [birth year] DOA: 11/29/2020 PCP: [facility]   Brief Narrative:  70 year old with history of tobacco use, alcohol use, CKD stage II, systolic CHF EF 65%, recurrent pancreatitis, GERD, polysubstance abuse admitted for abdominal pain.  He also reported of shortness of breath and cough.  He was diagnosed with acute pancreatitis and COPD exacerbation and admitted to the hospital.  GI team was consulted.  MRCP performed showed possible pseudocyst.  Conservative management was planned with outpatient EUS.  Repeat echocardiogram in the hospital showed EF of 15-20%.   Assessment & Plan:   Principal Problem:   Acute pancreatitis Active Problems:   Essential hypertension   Tobacco use disorder   CKD (chronic kidney disease), stage II   COPD (chronic obstructive pulmonary disease) (HCC)   Chronic HFrEF (heart failure with reduced ejection fraction) (HCC)   Pain   Pancreatic pseudocyst   Dilation of biliary tract   Dilation of pancreatic duct   Anemia   Acute recurrent alcoholic pancreatitis - CT shows possible concerns of complicated pancreatitis.  Advance his diet to soft.  Gentle hydration with caution.  Pain control, bowel regimen.  MRCP showed pseudocyst but no active necrosis.  He still having quite a bit of abdominal pain but we will see how he tolerates soft.  Seen by GI team, no further inpatient work-up planned for now.   Acute respiratory distress Acute bronchitis with community-acquired pneumonia Acute COPD exacerbation -Likely from COPD exacerbation.  Procalcitonin 0.1. -Empiric IV Rocephin and azithromycin.  Prednisone day 3 of 5 - Supplemental oxygen.  I-S/flutter - Scheduled and as needed bronchodilators   Acute congestive heart failure with reduced ejection fraction, EF 15%, class II -Repeat echocardiogram shows EF of 15%.  Cardiology team to arrange for outpatient follow-up.  Will resume Lasix 20 mg  daily  Asymptomatic bacteriuria - He is already on Rocephin.  Follow-up urine cultures   EtOH Abuse -Alcohol withdrawal protocol.  Thiamine, folic acid and multivitamin   GERD -PPI   Constipation - Aggressive bowel regimen.  KUB   DVT prophylaxis: enoxaparin (LOVENOX) injection 40 mg Start: 12/01/20 1600 Code Status: Lovenox Family Communication:    Status is: Inpatient  Remains inpatient appropriate because:Inpatient level of care appropriate due to severity of illness.  He is going to attempt for soft diet today, if he tolerates it he may go home later today but if not then tomorrow.  Dispo: The patient is from: Home              Anticipated d/c is to: Home              Patient currently is not medically stable to d/c.   Difficult to place patient No       Subjective: States his breathing is better but still having abdominal discomfort.  Review of Systems Otherwise negative except as per HPI, including: General: Denies fever, chills, night sweats or unintended weight loss. Resp: Denies cough, wheezing, shortness of breath. Cardiac: Denies chest pain, palpitations, orthopnea, paroxysmal nocturnal dyspnea. GI: Denies nausea, vomiting, diarrhea or constipation GU: Denies dysuria, frequency, hesitancy or incontinence MS: Denies muscle aches, joint pain or swelling Neuro: Denies headache, neurologic deficits (focal weakness, numbness, tingling), abnormal gait Psych: Denies anxiety, depression, SI/HI/AVH Skin: Denies new rashes or lesions ID: Denies sick contacts, exotic exposures, travel  Examination:  Constitutional: Not in acute distress Respiratory: Clear to auscultation bilaterally Cardiovascular: Normal sinus rhythm, no rubs Abdomen:  Nontender nondistended good bowel sounds Musculoskeletal: No edema noted Skin: No rashes seen Neurologic: CN 2-12 grossly intact.  And nonfocal Psychiatric: Normal judgment and insight. Alert and oriented x 3. Normal  mood.  Objective: Vitals:   11/30/20 2007 12/01/20 0517 12/01/20 0710 12/01/20 0744  BP: 126/90 (!) 138/93  128/80  Pulse: (!) 108 (!) 101  99  Resp: 20 18    Temp:  97.6 F (36.4 C)    TempSrc:  Oral    SpO2: 100% 100% 100% 100%  Weight:  82.5 kg    Height:        Intake/Output Summary (Last 24 hours) at 12/01/2020 1224 Last data filed at 12/01/2020 0900 Gross per 24 hour  Intake 1630 ml  Output 300 ml  Net 1330 ml   Filed Weights   11/29/20 0834 11/30/20 0700 12/01/20 0517  Weight: 85.3 kg 83.2 kg 82.5 kg     Data Reviewed:   CBC: Recent Labs  Lab 11/27/20 0944 11/29/20 0921 11/30/20 0503 12/01/20 0456 12/01/20 0722  WBC 11.0* 12.0* 8.6 10.2  --   NEUTROABS 8.9* 10.4*  --   --   --   HGB 11.8* 12.0* 10.5* 7.1* 10.1*  HCT 36.4* 36.8* 32.7* 22.4* 30.9*  MCV 94.5 92.9 93.2 98.2  --   PLT 366 306 274 240  --    Basic Metabolic Panel: Recent Labs  Lab 11/27/20 0944 11/29/20 0921 11/30/20 0503 12/01/20 0456  NA 136 135 134* 136  K 3.8 3.7 4.1 3.9  CL 102 98 99 102  CO2 23 26 28 25   GLUCOSE 86 96 115* 126*  BUN 11 10 9 9   CREATININE 1.08 0.95 0.83 0.76  CALCIUM 8.3* 8.3* 7.9* 8.3*  MG  --   --  2.0 2.2   GFR: Estimated Creatinine Clearance: 90.2 mL/min (by C-G formula based on SCr of 0.76 mg/dL). Liver Function Tests: Recent Labs  Lab 11/27/20 0944 11/29/20 0921 11/30/20 0503 12/01/20 0456  AST 17 15 12* 29  ALT 12 12 10 15   ALKPHOS 105 209* 161* 150*  BILITOT 0.5 0.9 0.7 0.1*  PROT 6.9 7.2 6.4* 6.4*  ALBUMIN 3.0* 2.9* 2.4* 2.5*   Recent Labs  Lab 11/27/20 0944 11/29/20 0921  LIPASE 128* 87*   No results for input(s): AMMONIA in the last 168 hours. Coagulation Profile: No results for input(s): INR, PROTIME in the last 168 hours. Cardiac Enzymes: No results for input(s): CKTOTAL, CKMB, CKMBINDEX, TROPONINI in the last 168 hours. BNP (last 3 results) No results for input(s): PROBNP in the last 8760 hours. HbA1C: No results for  input(s): HGBA1C in the last 72 hours. CBG: No results for input(s): GLUCAP in the last 168 hours. Lipid Profile: Recent Labs    11/30/20 0503  CHOL 113  HDL 13*  LDLCALC 81  TRIG 93  CHOLHDL 8.7   Thyroid Function Tests: No results for input(s): TSH, T4TOTAL, FREET4, T3FREE, THYROIDAB in the last 72 hours. Anemia Panel: Recent Labs    11/29/20 0821  VITAMINB12 1,127*  FOLATE 14.4  FERRITIN 211  TIBC 271  IRON 15*   Sepsis Labs: Recent Labs  Lab 11/29/20 0921  PROCALCITON 0.10    Recent Results (from the past 240 hour(s))  Resp Panel by RT-PCR (Flu A&B, Covid) Nasopharyngeal Swab     Status: None   Collection Time: 11/27/20 10:27 AM   Specimen: Nasopharyngeal Swab; Nasopharyngeal(NP) swabs in vial transport medium  Result Value Ref Range Status   SARS Coronavirus  2 by RT PCR NEGATIVE NEGATIVE Final    Comment: (NOTE) SARS-CoV-2 target nucleic acids are NOT DETECTED.  The SARS-CoV-2 RNA is generally detectable in upper respiratory specimens during the acute phase of infection. The lowest concentration of SARS-CoV-2 viral copies this assay can detect is 138 copies/mL. A negative result does not preclude SARS-Cov-2 infection and should not be used as the sole basis for treatment or other patient management decisions. A negative result may occur with  improper specimen collection/handling, submission of specimen other than nasopharyngeal swab, presence of viral mutation(s) within the areas targeted by this assay, and inadequate number of viral copies(<138 copies/mL). A negative result must be combined with clinical observations, patient history, and epidemiological information. The expected result is Negative.  Fact Sheet for Patients:  EntrepreneurPulse.com.au  Fact Sheet for Healthcare Providers:  IncredibleEmployment.be  This test is no t yet approved or cleared by the [place] FDA and  has been authorized for  detection and/or diagnosis of SARS-CoV-2 by FDA under an Emergency Use Authorization (EUA). This EUA will remain  in effect (meaning this test can be used) for the duration of the COVID-19 declaration under Section 564(b)(1) of the Act, 21 U.S.C.section 360bbb-3(b)(1), unless the authorization is terminated  or revoked sooner.       Influenza A by PCR NEGATIVE NEGATIVE Final   Influenza B by PCR NEGATIVE NEGATIVE Final    Comment: (NOTE) The Xpert Xpress SARS-CoV-2/FLU/RSV plus assay is intended as an aid in the diagnosis of influenza from Nasopharyngeal swab specimens and should not be used as a sole basis for treatment. Nasal washings and aspirates are unacceptable for Xpert Xpress SARS-CoV-2/FLU/RSV testing.  Fact Sheet for Patients: EntrepreneurPulse.com.au  Fact Sheet for Healthcare Providers: IncredibleEmployment.be  This test is not yet approved or cleared by the [place] FDA and has been authorized for detection and/or diagnosis of SARS-CoV-2 by FDA under an Emergency Use Authorization (EUA). This EUA will remain in effect (meaning this test can be used) for the duration of the COVID-19 declaration under Section 564(b)(1) of the Act, 21 U.S.C. section 360bbb-3(b)(1), unless the authorization is terminated or revoked.  Performed at [facility], [address] [place], [place] [address]   Urine Culture     Status: Abnormal   Collection Time: 11/29/20 12:00 PM   Specimen: Urine, Clean Catch  Result Value Ref Range Status   Specimen Description   Final    URINE, CLEAN CATCH Performed at [facility], [address], [place], [place] [address]    Special Requests   Final    NONE Performed at [facility], [address] [place],  [address]    Culture MULTIPLE SPECIES PRESENT, SUGGEST RECOLLECTION (A)  Final   Report Status 12/01/2020 FINAL  Final  Resp Panel by RT-PCR (Flu A&B, Covid)     Status: None   Collection  Time: 11/29/20 12:08 PM   Specimen: Nasopharyngeal(NP) swabs in vial transport medium  Result Value Ref Range Status   SARS Coronavirus 2 by RT PCR NEGATIVE NEGATIVE Final    Comment: (NOTE) SARS-CoV-2 target nucleic acids are NOT DETECTED.  The SARS-CoV-2 RNA is generally detectable in upper respiratory specimens during the acute phase of infection. The lowest concentration of SARS-CoV-2 viral copies this assay can detect is 138 copies/mL. A negative result does not preclude SARS-Cov-2 infection and should not be used as the sole basis for treatment or other patient management decisions. A negative result may occur with  improper specimen collection/handling, submission  of specimen other than nasopharyngeal swab, presence of viral mutation(s) within the areas targeted by this assay, and inadequate number of viral copies(<138 copies/mL). A negative result must be combined with clinical observations, patient history, and epidemiological information. The expected result is Negative.  Fact Sheet for Patients:  EntrepreneurPulse.com.au  Fact Sheet for Healthcare Providers:  IncredibleEmployment.be  This test is no t yet approved or cleared by the [place] FDA and  has been authorized for detection and/or diagnosis of SARS-CoV-2 by FDA under an Emergency Use Authorization (EUA). This EUA will remain  in effect (meaning this test can be used) for the duration of the COVID-19 declaration under Section 564(b)(1) of the Act, 21 U.S.C.section 360bbb-3(b)(1), unless the authorization is terminated  or revoked sooner.       Influenza A by PCR NEGATIVE NEGATIVE Final   Influenza B by PCR NEGATIVE NEGATIVE Final    Comment: (NOTE) The Xpert Xpress SARS-CoV-2/FLU/RSV plus assay is intended as an aid in the diagnosis of influenza from Nasopharyngeal swab specimens and should not be used as a sole basis for treatment. Nasal washings and aspirates are  unacceptable for Xpert Xpress SARS-CoV-2/FLU/RSV testing.  Fact Sheet for Patients: EntrepreneurPulse.com.au  Fact Sheet for Healthcare Providers: IncredibleEmployment.be  This test is not yet approved or cleared by the [place] FDA and has been authorized for detection and/or diagnosis of SARS-CoV-2 by FDA under an Emergency Use Authorization (EUA). This EUA will remain in effect (meaning this test can be used) for the duration of the COVID-19 declaration under Section 564(b)(1) of the Act, 21 U.S.C. section 360bbb-3(b)(1), unless the authorization is terminated or revoked.  Performed at [facility], [address], [place], [place] [address]          Radiology Studies: MR 3D Recon At Scanner  Result Date: 11/29/2020 CLINICAL DATA:  70 year old male with history of pancreatitis. Biliary ductal dilatation. EXAM: MRI ABDOMEN WITHOUT AND WITH CONTRAST (INCLUDING MRCP) TECHNIQUE: Multiplanar multisequence MR imaging of the abdomen was performed both before and after the administration of intravenous contrast. Heavily T2-weighted images of the biliary and pancreatic ducts were obtained, and three-dimensional MRCP images were rendered by post processing. CONTRAST:  27mL GADAVIST GADOBUTROL 1 MMOL/ML IV SOLN COMPARISON:  No prior abdominal MRI. CT the abdomen and pelvis 11/29/2020. FINDINGS: Lower chest: Unremarkable. Hepatobiliary: No suspicious cystic or solid hepatic lesions. There is mild intrahepatic biliary ductal dilatation. Common hepatic duct is dilated measuring up to 15 mm in diameter. Common bile duct is mildly dilated measuring up to 8 mm in diameter. MRCP images are suboptimal but demonstrate a potential filling defect in the distal common bile duct estimated to measure approximately 5 mm (coronal image 56 of series 11). However, this cannot be confirmed on additional T2 weighted sequences. There are no filling defects within the gallbladder  to suggest cholelithiasis. Gallbladder is moderately distended. Gallbladder wall thickness appears normal. Pancreas: Pancreatic ductal dilatation is again noted throughout the body and tail of the pancreas, measuring up to 9 mm in the body. This ductal dilatation abruptly terminates in the region of the pancreatic head. In this region, no discrete pancreatic mass is confidently identified on today's examination. There is atrophy in the distal body and tail of the pancreas. Several complex fluid collections emanate from the pancreas and extend into the root of the small bowel mesentery, demonstrating heterogeneous signal intensity on T1 and T2 weighted images, compatible with pancreatic pseudocysts. The largest of these is complex in shape and therefore difficult  to measure, but estimated measure approximately 6.2 x 3.0 x 5.5 cm (axial image 24 of series 4 and coronal image 10 of series 5). Spleen:  Unremarkable. Adrenals/Urinary Tract: Multiple small T1 hypointense, T2 hyperintense, nonenhancing lesions are noted in both kidneys, compatible with simple cysts, measuring 1.3 cm or less in size. No definite solid renal lesions are noted. No hydroureteronephrosis in the visualized portions of the abdomen. Bilateral adrenal glands are normal in appearance. Stomach/Bowel: Unremarkable. Vascular/Lymphatic: No aneurysm identified in the visualized abdominal vasculature. Splenic vein, superior mesenteric vein, splenoportal confluence and proximal portal vein are all narrowed secondary to mass effect from the acute pancreatitis. Other:  Small volume of ascites most evident adjacent to the liver. Musculoskeletal: No aggressive appearing osseous lesions are noted in the visualized portions of the skeleton. IMPRESSION: 1. MRCP images are suboptimal, but suggest the possibility of choledocholithiasis in the distal common bile duct. Notably, however, this could not be confirmed on additional T2 weighted sequences, and would not  explain the persistent pancreatic ductal dilatation seen on today's examination. The possibility of a pancreatic head mass is considered, although no discrete mass is confidently identified on today's examination which is confounded by the presence of acute pancreatitis with multiple complex pseudocysts, as discussed above. Electronically Signed   By: Vinnie Langton M.D.   On: 11/29/2020 20:59   MR ABDOMEN MRCP W WO CONTAST  Result Date: 11/29/2020 CLINICAL DATA:  70 year old male with history of pancreatitis. Biliary ductal dilatation. EXAM: MRI ABDOMEN WITHOUT AND WITH CONTRAST (INCLUDING MRCP) TECHNIQUE: Multiplanar multisequence MR imaging of the abdomen was performed both before and after the administration of intravenous contrast. Heavily T2-weighted images of the biliary and pancreatic ducts were obtained, and three-dimensional MRCP images were rendered by post processing. CONTRAST:  17mL GADAVIST GADOBUTROL 1 MMOL/ML IV SOLN COMPARISON:  No prior abdominal MRI. CT the abdomen and pelvis 11/29/2020. FINDINGS: Lower chest: Unremarkable. Hepatobiliary: No suspicious cystic or solid hepatic lesions. There is mild intrahepatic biliary ductal dilatation. Common hepatic duct is dilated measuring up to 15 mm in diameter. Common bile duct is mildly dilated measuring up to 8 mm in diameter. MRCP images are suboptimal but demonstrate a potential filling defect in the distal common bile duct estimated to measure approximately 5 mm (coronal image 56 of series 11). However, this cannot be confirmed on additional T2 weighted sequences. There are no filling defects within the gallbladder to suggest cholelithiasis. Gallbladder is moderately distended. Gallbladder wall thickness appears normal. Pancreas: Pancreatic ductal dilatation is again noted throughout the body and tail of the pancreas, measuring up to 9 mm in the body. This ductal dilatation abruptly terminates in the region of the pancreatic head. In this  region, no discrete pancreatic mass is confidently identified on today's examination. There is atrophy in the distal body and tail of the pancreas. Several complex fluid collections emanate from the pancreas and extend into the root of the small bowel mesentery, demonstrating heterogeneous signal intensity on T1 and T2 weighted images, compatible with pancreatic pseudocysts. The largest of these is complex in shape and therefore difficult to measure, but estimated measure approximately 6.2 x 3.0 x 5.5 cm (axial image 24 of series 4 and coronal image 10 of series 5). Spleen:  Unremarkable. Adrenals/Urinary Tract: Multiple small T1 hypointense, T2 hyperintense, nonenhancing lesions are noted in both kidneys, compatible with simple cysts, measuring 1.3 cm or less in size. No definite solid renal lesions are noted. No hydroureteronephrosis in the visualized portions of the abdomen. Bilateral  adrenal glands are normal in appearance. Stomach/Bowel: Unremarkable. Vascular/Lymphatic: No aneurysm identified in the visualized abdominal vasculature. Splenic vein, superior mesenteric vein, splenoportal confluence and proximal portal vein are all narrowed secondary to mass effect from the acute pancreatitis. Other:  Small volume of ascites most evident adjacent to the liver. Musculoskeletal: No aggressive appearing osseous lesions are noted in the visualized portions of the skeleton. IMPRESSION: 1. MRCP images are suboptimal, but suggest the possibility of choledocholithiasis in the distal common bile duct. Notably, however, this could not be confirmed on additional T2 weighted sequences, and would not explain the persistent pancreatic ductal dilatation seen on today's examination. The possibility of a pancreatic head mass is considered, although no discrete mass is confidently identified on today's examination which is confounded by the presence of acute pancreatitis with multiple complex pseudocysts, as discussed above.  Electronically Signed   By: Vinnie Langton M.D.   On: 11/29/2020 20:59   ECHOCARDIOGRAM COMPLETE  Result Date: 11/29/2020    ECHOCARDIOGRAM REPORT   Patient Name:   Ryan [name] Date of Exam: 11/29/2020 Medical Rec #:  [id number]       Height:       71.0 in Accession #:    [id number]      Weight:       188.0 lb Date of Birth:  1950/08/25       BSA:          2.054 m Patient Age:    20 years        BP:           148/83 mmHg Patient Gender: M               HR:           98 bpm. Exam Location:  [facility] Procedure: 2D Echo, Cardiac Doppler and Color Doppler Indications:    Cardiomyopathy  History:        Patient has prior history of Echocardiogram examinations, most                 recent 10/16/2018. CHF, COPD; Risk Factors:Hypertension and                 Dyslipidemia. Nonischemic cardiomyopathy, Tobacco abuse.  Sonographer:    Wenda Low Referring Phys: [id number] Jen Eppinger CHIRAG [place]ignano IMPRESSIONS  1. Global hypokinesis. The anterior, anteroseptal walls are akinetic. Marland Kitchen Left ventricular ejection fraction, by estimation, is 15-20%. The left ventricle has severely decreased function. The left ventricle demonstrates global hypokinesis. The left ventricular internal cavity size was severely dilated. Left ventricular diastolic parameters are indeterminate.  2. Right ventricular systolic function is normal. The right ventricular size is normal.  3. Left atrial size was severely dilated.  4. The mitral valve is normal in structure. Trivial mitral valve regurgitation. No evidence of mitral stenosis.  5. The aortic valve is tricuspid. Aortic valve regurgitation is not visualized. No aortic stenosis is present. FINDINGS  Left Ventricle: Global hypokinesis. The anterior, anteroseptal walls are akinetic. Left ventricular ejection fraction, by estimation, is 15-20%. The left ventricle has severely decreased function. The left ventricle demonstrates global hypokinesis. The left ventricular internal cavity size was severely  dilated. There is no left ventricular hypertrophy. Left ventricular diastolic parameters are indeterminate. Right Ventricle: The right ventricular size is normal. No increase in right ventricular wall thickness. Right ventricular systolic function is normal. Left Atrium: Left atrial size was severely dilated. Right Atrium: Right atrial size was normal in size. Pericardium: There is no  evidence of pericardial effusion. Mitral Valve: The mitral valve is normal in structure. Trivial mitral valve regurgitation. No evidence of mitral valve stenosis. MV peak gradient, 3.1 mmHg. The mean mitral valve gradient is 1.0 mmHg. Tricuspid Valve: The tricuspid valve is not well visualized. Tricuspid valve regurgitation is not demonstrated. No evidence of tricuspid stenosis. Aortic Valve: The aortic valve is tricuspid. Aortic valve regurgitation is not visualized. No aortic stenosis is present. Aortic valve mean gradient measures 3.0 mmHg. Aortic valve peak gradient measures 4.8 mmHg. Aortic valve area, by VTI measures 3.28 cm. Pulmonic Valve: The pulmonic valve was not well visualized. Pulmonic valve regurgitation is not visualized. No evidence of pulmonic stenosis. Aorta: The aortic root is normal in size and structure. IAS/Shunts: The interatrial septum was not well visualized.  LEFT VENTRICLE PLAX 2D LVIDd:         7.00 cm      Diastology LVIDs:         6.50 cm      LV e' medial:    4.68 cm/s LV PW:         1.10 cm      LV E/e' medial:  17.8 LV IVS:        0.90 cm      LV e' lateral:   10.40 cm/s LVOT diam:     2.00 cm      LV E/e' lateral: 8.0 LV SV:         58 LV SV Index:   28 LVOT Area:     3.14 cm  LV Volumes (MOD) LV vol d, MOD A2C: 288.0 ml LV vol d, MOD A4C: 275.0 ml LV vol s, MOD A2C: 238.0 ml LV vol s, MOD A4C: 199.0 ml LV SV MOD A2C:     50.0 ml LV SV MOD A4C:     275.0 ml LV SV MOD BP:      62.3 ml RIGHT VENTRICLE RV Basal diam:  3.55 cm RV Mid diam:    3.10 cm RV S prime:     13.50 cm/s TAPSE (M-mode): 1.8 cm  LEFT ATRIUM              Index       RIGHT ATRIUM           Index LA diam:        4.30 cm  2.09 cm/m  RA Area:     19.30 cm LA Vol (A2C):   113.0 ml 55.02 ml/m RA Volume:   54.80 ml  26.68 ml/m LA Vol (A4C):   126.0 ml 61.35 ml/m LA Biplane Vol: 124.0 ml 60.37 ml/m  AORTIC VALVE                   PULMONIC VALVE AV Area (Vmax):    3.23 cm    PV Vmax:       0.80 m/s AV Area (Vmean):   2.89 cm    PV Peak grad:  2.6 mmHg AV Area (VTI):     3.28 cm AV Vmax:           109.00 cm/s AV Vmean:          74.400 cm/s AV VTI:            0.176 m AV Peak Grad:      4.8 mmHg AV Mean Grad:      3.0 mmHg LVOT Vmax:         112.00 cm/s LVOT Vmean:  68.400 cm/s LVOT VTI:          0.184 m LVOT/AV VTI ratio: 1.05  AORTA Ao Root diam: 3.60 cm Ao Asc diam:  3.50 cm MITRAL VALVE MV Area (PHT): 5.42 cm    SHUNTS MV Area VTI:   4.62 cm    Systemic VTI:  0.18 m MV Peak grad:  3.1 mmHg    Systemic Diam: 2.00 cm MV Mean grad:  1.0 mmHg MV Vmax:       0.88 m/s MV Vmean:      52.5 cm/s MV Decel Time: 140 msec MV E velocity: 83.10 cm/s MV A velocity: 62.10 cm/s MV E/A ratio:  1.34 Carlyle Dolly MD Electronically signed by Carlyle Dolly MD Signature Date/Time: 11/29/2020/4:25:37 PM    Final         Scheduled Meds:  azithromycin  500 mg Oral Daily   budesonide (PULMICORT) nebulizer solution  0.5 mg Nebulization BID   enoxaparin (LOVENOX) injection  40 mg Subcutaneous Q24H   ferrous sulfate  325 mg Oral Q breakfast   folic acid  1 mg Oral Daily   gabapentin  300 mg Oral BID   ipratropium-albuterol  3 mL Nebulization BID   lipase/protease/amylase  24,000 Units Oral TID AC   multivitamin with minerals  1 tablet Oral Daily   pantoprazole  40 mg Oral Daily   predniSONE  40 mg Oral Q breakfast   senna-docusate  1 tablet Oral QHS   thiamine  100 mg Oral Daily   Or   thiamine  100 mg Intravenous Daily   Continuous Infusions:  cefTRIAXone (ROCEPHIN)  IV 1 g (12/01/20 1155)     LOS: 2 days   Time spent= 35  mins    Luigi Stuckey Arsenio Loader, MD [facility]  If 7PM-7AM, please contact night-coverage  12/01/2020, 12:24 PM

## 2020-12-01 NOTE — Progress Notes (Signed)
Nurse entered room this morning to give pain meds, pt was complaining of stomach pain. Upon entering room, nurse discovered graham cracker wrappers in trash can. When asked about the wrappers, patient stated that he was given a snack last night. Nurse re-educated patient on diet orders being full liquid and gave recommendations of some things he could eat.

## 2020-12-01 NOTE — Progress Notes (Addendum)
Subjective: Patient states he is still having upper abdominal pain radiating to his back and nausea. States that abdominal pain waxes and wanes. He denies any episodes of vomiting. States he tried to eat some soup last night but only had a bite or two. He has not eaten breakfast this morning. Does not really have much of an appetite. States he has not had a BM in about 3 days.   Objective: Vital signs in last 24 hours: Temp:  [97.6 F (36.4 C)] 97.6 F (36.4 C) (09/21 0517) Pulse Rate:  [99-108] 99 (09/21 0744) Resp:  [18-20] 18 (09/21 0517) BP: (126-138)/(80-93) 128/80 (09/21 0744) SpO2:  [97 %-100 %] 100 % (09/21 0744) Weight:  [82.5 kg] 82.5 kg (09/21 0517) Last BM Date: 11/27/20 General:   Alert and oriented, pleasant Head:  Normocephalic and atraumatic. Eyes:  No icterus, sclera clear. Conjuctiva pink.  Mouth:  Without lesions, mucosa pink and moist.  Neck:  Supple, without thyromegaly or masses.  Heart:  S1, S2 present, no murmurs noted.  Lungs: Clear to auscultation bilaterally, without wheezing, rales, or rhonchi.  Abdomen:  Bowel sounds present, soft, non-distended. Mild TTP of diffuse abdomen. No HSM or hernias noted. No rebound or guarding. No masses appreciated  Msk:  Symmetrical without gross deformities. Normal posture. Pulses:  Normal pulses noted. Extremities:  Without clubbing or edema. Neurologic:  Alert and  oriented x4;  grossly normal neurologically. Skin:  Warm and dry, intact without significant lesions.  Psych:  Alert and cooperative. Normal mood and affect.  Intake/Output from previous day: 09/20 0701 - 09/21 0700 In: 1550 [P.O.:1550] Out: 300 [Urine:300]   Lab Results: Recent Labs    11/29/20 0921 11/30/20 0503 12/01/20 0456 12/01/20 0722  WBC 12.0* 8.6 10.2  --   HGB 12.0* 10.5* 7.1* 10.1*  HCT 36.8* 32.7* 22.4* 30.9*  PLT 306 274 240  --    BMET Recent Labs    11/29/20 0921 11/30/20 0503 12/01/20 0456  NA 135 134* 136  K 3.7 4.1 3.9   CL 98 99 102  CO2 '26 28 25  ' GLUCOSE 96 115* 126*  BUN '10 9 9  ' CREATININE 0.95 0.83 0.76  CALCIUM 8.3* 7.9* 8.3*   LFT Recent Labs    11/29/20 0921 11/30/20 0503 12/01/20 0456  PROT 7.2 6.4* 6.4*  ALBUMIN 2.9* 2.4* 2.5*  AST 15 12* 29  ALT '12 10 15  ' ALKPHOS 209* 161* 150*  BILITOT 0.9 0.7 0.1*    Studies/Results: CT Abdomen Pelvis W Contrast  Result Date: 11/29/2020 CLINICAL DATA:  Persistent pancreatitis EXAM: CT ABDOMEN AND PELVIS WITH CONTRAST TECHNIQUE: Multidetector CT imaging of the abdomen and pelvis was performed using the standard protocol following bolus administration of intravenous contrast. Sagittal and coronal MPR images reconstructed from axial data set. CONTRAST:  85m OMNIPAQUE IOHEXOL 350 MG/ML SOLN IV. No oral contrast. COMPARISON:  10/24/2020 FINDINGS: Lower chest: Minimal bibasilar atelectasis Hepatobiliary: Gallbladder distended with small amount of adjacent fluid, though ascites is seen elsewhere in the upper abdomen as well. No calcifications. Mild intrahepatic and extrahepatic biliary dilatation, with CBD measuring 11 mm Pancreas: Parenchymal atrophy and ductal dilatation. Surrounding edema. Multiple fluid collections consistent with pseudocysts. Fluid collection anterior to proximal pancreatic tail 5.6 x 3.6 cm image 22. Reverse C-shaped collection inferior to pancreatic body and proximal tail 7.3 x 5.3 cm image 34. Collection adjacent to pancreatic body 3.2 x 2.3 cm image 28. Small fluid collection anteriorly in upper abdomen 3.7 x 1.8 cm image 28,  potentially communicating with the reverse C-shaped collection. Spleen: Normal appearance Adrenals/Urinary Tract: Adrenal glands normal appearance. Small BILATERAL renal cysts. Large calculus at inferior pole RIGHT kidney 16 x 12 mm image 39. Kidneys, ureters, and bladder otherwise normal appearance. Stomach/Bowel: Appendix not visualized. Diverticulosis of distal colon without evidence of diverticulitis. Stomach  decompressed. Bowel loops otherwise unremarkable. Vascular/Lymphatic: Atherosclerotic calcifications aorta and iliac arteries without aneurysm. Enlargement of cardiac chambers. No adenopathy. Reproductive: Prostate gland normal size. Seminal vesicles unremarkable. Other: Scattered ascites.  No free air.  No hernia. Musculoskeletal: Old BILATERAL pelvic fractures. Old proximal LEFT femoral fracture with deformity. IMPRESSION: Acute pancreatitis with multiple peripancreatic pseudocysts as above, majority new since the prior exam though these correspond in position with areas of inflammation/phlegmon seen on the previous exam. Mild biliary dilatation with CBD measuring 11 mm, recommend correlation with LFTs. Distal colonic diverticulosis without evidence of diverticulitis. Nonobstructing RIGHT renal calculus 16 x 12 mm. Aortic Atherosclerosis (ICD10-I70.0). Electronically Signed   By: Lavonia Dana M.D.   On: 11/29/2020 11:38   MR 3D Recon At Scanner  Result Date: 11/29/2020 CLINICAL DATA:  70 year old male with history of pancreatitis. Biliary ductal dilatation. EXAM: MRI ABDOMEN WITHOUT AND WITH CONTRAST (INCLUDING MRCP) TECHNIQUE: Multiplanar multisequence MR imaging of the abdomen was performed both before and after the administration of intravenous contrast. Heavily T2-weighted images of the biliary and pancreatic ducts were obtained, and three-dimensional MRCP images were rendered by post processing. CONTRAST:  44m GADAVIST GADOBUTROL 1 MMOL/ML IV SOLN COMPARISON:  No prior abdominal MRI. CT the abdomen and pelvis 11/29/2020. FINDINGS: Lower chest: Unremarkable. Hepatobiliary: No suspicious cystic or solid hepatic lesions. There is mild intrahepatic biliary ductal dilatation. Common hepatic duct is dilated measuring up to 15 mm in diameter. Common bile duct is mildly dilated measuring up to 8 mm in diameter. MRCP images are suboptimal but demonstrate a potential filling defect in the distal common bile duct  estimated to measure approximately 5 mm (coronal image 56 of series 11). However, this cannot be confirmed on additional T2 weighted sequences. There are no filling defects within the gallbladder to suggest cholelithiasis. Gallbladder is moderately distended. Gallbladder wall thickness appears normal. Pancreas: Pancreatic ductal dilatation is again noted throughout the body and tail of the pancreas, measuring up to 9 mm in the body. This ductal dilatation abruptly terminates in the region of the pancreatic head. In this region, no discrete pancreatic mass is confidently identified on today's examination. There is atrophy in the distal body and tail of the pancreas. Several complex fluid collections emanate from the pancreas and extend into the root of the small bowel mesentery, demonstrating heterogeneous signal intensity on T1 and T2 weighted images, compatible with pancreatic pseudocysts. The largest of these is complex in shape and therefore difficult to measure, but estimated measure approximately 6.2 x 3.0 x 5.5 cm (axial image 24 of series 4 and coronal image 10 of series 5). Spleen:  Unremarkable. Adrenals/Urinary Tract: Multiple small T1 hypointense, T2 hyperintense, nonenhancing lesions are noted in both kidneys, compatible with simple cysts, measuring 1.3 cm or less in size. No definite solid renal lesions are noted. No hydroureteronephrosis in the visualized portions of the abdomen. Bilateral adrenal glands are normal in appearance. Stomach/Bowel: Unremarkable. Vascular/Lymphatic: No aneurysm identified in the visualized abdominal vasculature. Splenic vein, superior mesenteric vein, splenoportal confluence and proximal portal vein are all narrowed secondary to mass effect from the acute pancreatitis. Other:  Small volume of ascites most evident adjacent to the liver. Musculoskeletal: No aggressive  appearing osseous lesions are noted in the visualized portions of the skeleton. IMPRESSION: 1. MRCP images  are suboptimal, but suggest the possibility of choledocholithiasis in the distal common bile duct. Notably, however, this could not be confirmed on additional T2 weighted sequences, and would not explain the persistent pancreatic ductal dilatation seen on today's examination. The possibility of a pancreatic head mass is considered, although no discrete mass is confidently identified on today's examination which is confounded by the presence of acute pancreatitis with multiple complex pseudocysts, as discussed above. Electronically Signed   By: Vinnie Langton M.D.   On: 11/29/2020 20:59   DG Chest Port 1 View  Result Date: 11/29/2020 CLINICAL DATA:  Cough, dyspnea, abdominal pain, pancreatitis EXAM: PORTABLE CHEST 1 VIEW COMPARISON:  11/27/2020 chest radiograph. FINDINGS: Stable cardiomediastinal silhouette with mild cardiomegaly. No pneumothorax. No pleural effusion. Lungs appear clear, with no acute consolidative airspace disease and no pulmonary edema. IMPRESSION: Mild cardiomegaly without pulmonary edema. No active pulmonary disease. Electronically Signed   By: Ilona Sorrel M.D.   On: 11/29/2020 12:26   MR ABDOMEN MRCP W WO CONTAST  Result Date: 11/29/2020 CLINICAL DATA:  70 year old male with history of pancreatitis. Biliary ductal dilatation. EXAM: MRI ABDOMEN WITHOUT AND WITH CONTRAST (INCLUDING MRCP) TECHNIQUE: Multiplanar multisequence MR imaging of the abdomen was performed both before and after the administration of intravenous contrast. Heavily T2-weighted images of the biliary and pancreatic ducts were obtained, and three-dimensional MRCP images were rendered by post processing. CONTRAST:  17m GADAVIST GADOBUTROL 1 MMOL/ML IV SOLN COMPARISON:  No prior abdominal MRI. CT the abdomen and pelvis 11/29/2020. FINDINGS: Lower chest: Unremarkable. Hepatobiliary: No suspicious cystic or solid hepatic lesions. There is mild intrahepatic biliary ductal dilatation. Common hepatic duct is dilated  measuring up to 15 mm in diameter. Common bile duct is mildly dilated measuring up to 8 mm in diameter. MRCP images are suboptimal but demonstrate a potential filling defect in the distal common bile duct estimated to measure approximately 5 mm (coronal image 56 of series 11). However, this cannot be confirmed on additional T2 weighted sequences. There are no filling defects within the gallbladder to suggest cholelithiasis. Gallbladder is moderately distended. Gallbladder wall thickness appears normal. Pancreas: Pancreatic ductal dilatation is again noted throughout the body and tail of the pancreas, measuring up to 9 mm in the body. This ductal dilatation abruptly terminates in the region of the pancreatic head. In this region, no discrete pancreatic mass is confidently identified on today's examination. There is atrophy in the distal body and tail of the pancreas. Several complex fluid collections emanate from the pancreas and extend into the root of the small bowel mesentery, demonstrating heterogeneous signal intensity on T1 and T2 weighted images, compatible with pancreatic pseudocysts. The largest of these is complex in shape and therefore difficult to measure, but estimated measure approximately 6.2 x 3.0 x 5.5 cm (axial image 24 of series 4 and coronal image 10 of series 5). Spleen:  Unremarkable. Adrenals/Urinary Tract: Multiple small T1 hypointense, T2 hyperintense, nonenhancing lesions are noted in both kidneys, compatible with simple cysts, measuring 1.3 cm or less in size. No definite solid renal lesions are noted. No hydroureteronephrosis in the visualized portions of the abdomen. Bilateral adrenal glands are normal in appearance. Stomach/Bowel: Unremarkable. Vascular/Lymphatic: No aneurysm identified in the visualized abdominal vasculature. Splenic vein, superior mesenteric vein, splenoportal confluence and proximal portal vein are all narrowed secondary to mass effect from the acute pancreatitis.  Other:  Small volume of ascites most  evident adjacent to the liver. Musculoskeletal: No aggressive appearing osseous lesions are noted in the visualized portions of the skeleton. IMPRESSION: 1. MRCP images are suboptimal, but suggest the possibility of choledocholithiasis in the distal common bile duct. Notably, however, this could not be confirmed on additional T2 weighted sequences, and would not explain the persistent pancreatic ductal dilatation seen on today's examination. The possibility of a pancreatic head mass is considered, although no discrete mass is confidently identified on today's examination which is confounded by the presence of acute pancreatitis with multiple complex pseudocysts, as discussed above. Electronically Signed   By: Vinnie Langton M.D.   On: 11/29/2020 20:59   ECHOCARDIOGRAM COMPLETE  Result Date: 11/29/2020    ECHOCARDIOGRAM REPORT   Patient Name:   Ryan [name] Date of Exam: 11/29/2020 Medical Rec #:  [id number]       Height:       71.0 in Accession #:    [id number]      Weight:       188.0 lb Date of Birth:  [birth year]       BSA:          2.054 m Patient Age:    13 years        BP:           148/83 mmHg Patient Gender: M               HR:           98 bpm. Exam Location:  [facility] Procedure: 2D Echo, Cardiac Doppler and Color Doppler Indications:    Cardiomyopathy  History:        Patient has prior history of Echocardiogram examinations, most                 recent 10/16/2018. CHF, COPD; Risk Factors:Hypertension and                 Dyslipidemia. Nonischemic cardiomyopathy, Tobacco abuse.  Sonographer:    Wenda Low Referring Phys: [id number] ANKIT CHIRAG AMIN IMPRESSIONS  1. Global hypokinesis. The anterior, anteroseptal walls are akinetic. Marland Kitchen Left ventricular ejection fraction, by estimation, is 15-20%. The left ventricle has severely decreased function. The left ventricle demonstrates global hypokinesis. The left ventricular internal cavity size was severely dilated. Left  ventricular diastolic parameters are indeterminate.  2. Right ventricular systolic function is normal. The right ventricular size is normal.  3. Left atrial size was severely dilated.  4. The mitral valve is normal in structure. Trivial mitral valve regurgitation. No evidence of mitral stenosis.  5. The aortic valve is tricuspid. Aortic valve regurgitation is not visualized. No aortic stenosis is present. FINDINGS  Left Ventricle: Global hypokinesis. The anterior, anteroseptal walls are akinetic. Left ventricular ejection fraction, by estimation, is 15-20%. The left ventricle has severely decreased function. The left ventricle demonstrates global hypokinesis. The left ventricular internal cavity size was severely dilated. There is no left ventricular hypertrophy. Left ventricular diastolic parameters are indeterminate. Right Ventricle: The right ventricular size is normal. No increase in right ventricular wall thickness. Right ventricular systolic function is normal. Left Atrium: Left atrial size was severely dilated. Right Atrium: Right atrial size was normal in size. Pericardium: There is no evidence of pericardial effusion. Mitral Valve: The mitral valve is normal in structure. Trivial mitral valve regurgitation. No evidence of mitral valve stenosis. MV peak gradient, 3.1 mmHg. The mean mitral valve gradient is 1.0 mmHg. Tricuspid Valve: The tricuspid valve is not well visualized. Tricuspid valve  regurgitation is not demonstrated. No evidence of tricuspid stenosis. Aortic Valve: The aortic valve is tricuspid. Aortic valve regurgitation is not visualized. No aortic stenosis is present. Aortic valve mean gradient measures 3.0 mmHg. Aortic valve peak gradient measures 4.8 mmHg. Aortic valve area, by VTI measures 3.28 cm. Pulmonic Valve: The pulmonic valve was not well visualized. Pulmonic valve regurgitation is not visualized. No evidence of pulmonic stenosis. Aorta: The aortic root is normal in size and structure.  IAS/Shunts: The interatrial septum was not well visualized.  LEFT VENTRICLE PLAX 2D LVIDd:         7.00 cm      Diastology LVIDs:         6.50 cm      LV e' medial:    4.68 cm/s LV PW:         1.10 cm      LV E/e' medial:  17.8 LV IVS:        0.90 cm      LV e' lateral:   10.40 cm/s LVOT diam:     2.00 cm      LV E/e' lateral: 8.0 LV SV:         58 LV SV Index:   28 LVOT Area:     3.14 cm  LV Volumes (MOD) LV vol d, MOD A2C: 288.0 ml LV vol d, MOD A4C: 275.0 ml LV vol s, MOD A2C: 238.0 ml LV vol s, MOD A4C: 199.0 ml LV SV MOD A2C:     50.0 ml LV SV MOD A4C:     275.0 ml LV SV MOD BP:      62.3 ml RIGHT VENTRICLE RV Basal diam:  3.55 cm RV Mid diam:    3.10 cm RV S prime:     13.50 cm/s TAPSE (M-mode): 1.8 cm LEFT ATRIUM              Index       RIGHT ATRIUM           Index LA diam:        4.30 cm  2.09 cm/m  RA Area:     19.30 cm LA Vol (A2C):   113.0 ml 55.02 ml/m RA Volume:   54.80 ml  26.68 ml/m LA Vol (A4C):   126.0 ml 61.35 ml/m LA Biplane Vol: 124.0 ml 60.37 ml/m  AORTIC VALVE                   PULMONIC VALVE AV Area (Vmax):    3.23 cm    PV Vmax:       0.80 m/s AV Area (Vmean):   2.89 cm    PV Peak grad:  2.6 mmHg AV Area (VTI):     3.28 cm AV Vmax:           109.00 cm/s AV Vmean:          74.400 cm/s AV VTI:            0.176 m AV Peak Grad:      4.8 mmHg AV Mean Grad:      3.0 mmHg LVOT Vmax:         112.00 cm/s LVOT Vmean:        68.400 cm/s LVOT VTI:          0.184 m LVOT/AV VTI ratio: 1.05  AORTA Ao Root diam: 3.60 cm Ao Asc diam:  3.50 cm MITRAL VALVE MV Area (PHT): 5.42 cm    SHUNTS  MV Area VTI:   4.62 cm    Systemic VTI:  0.18 m MV Peak grad:  3.1 mmHg    Systemic Diam: 2.00 cm MV Mean grad:  1.0 mmHg MV Vmax:       0.88 m/s MV Vmean:      52.5 cm/s MV Decel Time: 140 msec MV E velocity: 83.10 cm/s MV A velocity: 62.10 cm/s MV E/A ratio:  1.34 Carlyle Dolly MD Electronically signed by Carlyle Dolly MD Signature Date/Time: 11/29/2020/4:25:37 PM    Final     Assessment: 70 year old male  with pmh of CHF with EF 15-20%, CKD, polysubstance abuse, GERD, 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 Last admitted with acute pancreatitis Aug 2022, left AMA. Presented to ED on 9/17 with persistent epigastric abdominal pain. Given pain meds, protonix, lasix, no admission at that time. Presented again on 9/19 with similar symptoms, found to have acute pancreatitis with peripancreatic psuedocyst, mild biliary and pancreatic ductal dilation. GI consulted for further evaluation, also found to have acute bronchitis/COPD exacerbation, and likely UTI, started on antibiotics and prednisone.   Pancreatitis: acute on chronic r/t excessive alcohol use previously, does not drink alcohol anymore (x2 months), LFTs are WNl other than Alk phos which is mildly elevated but trending down, 209 initially, down to 150 today. Lipase is only 87, however, CT suggest acute pancreatitis with multiple peripancreatic pseudocyst which is likely from prior pancreatitis as well as biliary and pancreatic ductal dilation and pancreatic atrophy. Triglycerides 93.   Underwent MRI/MRCP with findings of mild intrahepatic biliary duct dilation, common hepatic duct dilated up to 69m, CBD dilated up to 843m Potential filling defected noted in distal common bile duct approx [id number] to confirm. No gallbladder defects to suggest cholelithiasis. No pancreatic mass definitively identified. Several complex fluid collections in pancreas. Pancreatic and biliary duct dilation have been present since atleast Nov 2021 as well as concern for possible pancreatic head mass on previous studies. [facility] patient left AMA. He ultimately needs EUS for follow up of biliary/pancreatic ductal dilation/recurrent pancreatitis in order to further evaluate/rule out malignancy.    Etiology of pancreatitis is likely related to biliary and pancreatic ductal dilation, however, malignancy is still a big concern. Drug induced pancreatitis from lasix as well as alcohol induced pancreatitis or chronic tobacco use are also etiologic considerations.   Dysphagia: chronic, intermittent pill dysphagia with large pills, no issues with solids or liquids, chronically on PPI daily as outpatient without significant GERD symptoms. Would benefit from EGD, this can be done outpatient.   Anemia: declined to 7.1 this morning, however, 10.1 on repeat, suspect 7.1 value was not a reliable specimen as he has remained around 10 since admission, however, baseline appears to be around 12. Denies overt GI bleeding, NSAIDs. Last colonoscopy Dec 2019, he will be due this December. Last EGD Nov 2019 showed congested duodenal mucosa.   Patient endorses last BM was probably 3 days ago, however, not much of food intake over the past few days. He denies history of constipation. Can try miralax to help with this.    Plan: can advance diet as tolerated Can do 17g miralax for constipation Trend LFTs Trend H&H, transfuse as needed Outpatient EUS Consider EGD with possible dilation outpatient   LOS: 2 days    12/01/2020, 9:44 AM  Neenah Canter L. CarAlver SorrowSN, APRN, AGNP-C Adult-Gerontology  Nurse Practitioner [facility]

## 2020-12-02 ENCOUNTER — Telehealth: Payer: Self-pay

## 2020-12-02 DIAGNOSIS — K859 Acute pancreatitis without necrosis or infection, unspecified: Secondary | ICD-10-CM | POA: Diagnosis not present

## 2020-12-02 DIAGNOSIS — J410 Simple chronic bronchitis: Secondary | ICD-10-CM | POA: Diagnosis not present

## 2020-12-02 DIAGNOSIS — I10 Essential (primary) hypertension: Secondary | ICD-10-CM | POA: Diagnosis not present

## 2020-12-02 DIAGNOSIS — I5022 Chronic systolic (congestive) heart failure: Secondary | ICD-10-CM | POA: Diagnosis not present

## 2020-12-02 LAB — COMPREHENSIVE METABOLIC PANEL
ALT: 33 U/L (ref 0–44)
AST: 44 U/L — ABNORMAL HIGH (ref 15–41)
Albumin: 2.6 g/dL — ABNORMAL LOW (ref 3.5–5.0)
Alkaline Phosphatase: 151 U/L — ABNORMAL HIGH (ref 38–126)
Anion gap: 8 (ref 5–15)
BUN: 11 mg/dL (ref 8–23)
CO2: 27 mmol/L (ref 22–32)
Calcium: 8.3 mg/dL — ABNORMAL LOW (ref 8.9–10.3)
Chloride: 102 mmol/L (ref 98–111)
Creatinine, Ser: 0.82 mg/dL (ref 0.61–1.24)
GFR, Estimated: >60 mL/min (ref >60)
Glucose, Bld: 110 mg/dL — ABNORMAL HIGH (ref 70–99)
Potassium: 3.7 mmol/L (ref 3.5–5.1)
Sodium: 137 mmol/L (ref 135–145)
Total Bilirubin: 0.1 mg/dL — ABNORMAL LOW (ref 0.3–1.2)
Total Protein: 6.5 g/dL (ref 6.5–8.1)

## 2020-12-02 LAB — LIPASE, BLOOD: Lipase: 131 U/L — ABNORMAL HIGH (ref 11–51)

## 2020-12-02 LAB — CBC
HCT: 30.6 % — ABNORMAL LOW (ref 39.0–52.0)
Hemoglobin: 9.9 g/dL — ABNORMAL LOW (ref 13.0–17.0)
MCH: 30.6 pg (ref 26.0–34.0)
MCHC: 32.4 g/dL (ref 30.0–36.0)
MCV: 94.4 fL (ref 80.0–100.0)
Platelets: 319 10*3/uL (ref 150–400)
RBC: 3.24 MIL/uL — ABNORMAL LOW (ref 4.22–5.81)
RDW: 17.2 % — ABNORMAL HIGH (ref 11.5–15.5)
WBC: 10.7 10*3/uL — ABNORMAL HIGH (ref 4.0–10.5)
nRBC: 0 % (ref 0.0–0.2)

## 2020-12-02 LAB — MAGNESIUM: Magnesium: 2.3 mg/dL (ref 1.7–2.4)

## 2020-12-02 NOTE — Progress Notes (Signed)
PROGRESS NOTE    Ryan [name]  GEZ:[id number] DOB: [birth year] DOA: 11/29/2020 PCP: [facility]   Brief Narrative:  70 year old with history of tobacco use, alcohol use, CKD stage II, systolic CHF EF 65%, recurrent pancreatitis, GERD, polysubstance abuse admitted for abdominal pain.  He also reported of shortness of breath and cough.  He was diagnosed with acute pancreatitis and COPD exacerbation and admitted to the hospital.  GI team was consulted.  MRCP performed showed possible pseudocyst.  Conservative management was planned with outpatient EUS.  Repeat echocardiogram in the hospital showed EF of 15-20%.   Assessment & Plan:   Principal Problem:   Acute pancreatitis Active Problems:   Essential hypertension   Tobacco use disorder   CKD (chronic kidney disease), stage II   COPD (chronic obstructive pulmonary disease) (HCC)   Chronic HFrEF (heart failure with reduced ejection fraction) (HCC)   Pain   Pancreatic pseudocyst   Dilation of biliary tract   Dilation of pancreatic duct   Anemia   Acute recurrent alcoholic pancreatitis - CT shows possible concerns of complicated pancreatitis.  Advance his diet to soft.  Gentle hydration with caution.  Pain control, bowel regimen.  MRCP showed pseudocyst but no active necrosis.  H patient still having abdominal pain and tolerating oral diet.  GI team is following   Acute respiratory distress Acute bronchitis with community-acquired pneumonia Acute COPD exacerbation -Likely from COPD exacerbation.  Procalcitonin 0.1. -Empiric IV Rocephin and azithromycin.  Prednisone day 4 of 5 - Supplemental oxygen.  I-S/flutter - Scheduled and as needed bronchodilators   Acute congestive heart failure with reduced ejection fraction, EF 15%, class II -Repeat echocardiogram shows EF of 15%.  Cardiology team to arrange for outpatient follow-up.  Lasix 20 mg orally daily  Asymptomatic bacteriuria - He is already on Rocephin.  Urine  cultures grew multiple species   EtOH Abuse -Alcohol withdrawal protocol.  Thiamine, folic acid and multivitamin   GERD -PPI   Constipation - Bowel regimen   DVT prophylaxis: enoxaparin (LOVENOX) injection 40 mg Start: 12/01/20 1600 Code Status: Lovenox Family Communication:    Status is: Inpatient  Remains inpatient appropriate because:Inpatient level of care appropriate due to severity of illness.  Still having difficulty tolerating orals.  Having abdominal pain.  Dispo: The patient is from: Home              Anticipated d/c is to: Home              Patient currently is not medically stable to d/c.   Difficult to place patient No       Subjective: Epigastric discomfort with oral intake  Review of Systems Otherwise negative except as per HPI, including: General: Denies fever, chills, night sweats or unintended weight loss. Resp: Denies cough, wheezing, shortness of breath. Cardiac: Denies chest pain, palpitations, orthopnea, paroxysmal nocturnal dyspnea. GI: Denies nausea, vomiting, diarrhea or constipation GU: Denies dysuria, frequency, hesitancy or incontinence MS: Denies muscle aches, joint pain or swelling Neuro: Denies headache, neurologic deficits (focal weakness, numbness, tingling), abnormal gait Psych: Denies anxiety, depression, SI/HI/AVH Skin: Denies new rashes or lesions ID: Denies sick contacts, exotic exposures, travel  Examination: Constitutional: Not in acute distress Respiratory: Clear to auscultation bilaterally Cardiovascular: Normal sinus rhythm, no rubs Abdomen: Nontender nondistended good bowel sounds Musculoskeletal: No edema noted Skin: No rashes seen Neurologic: CN 2-12 grossly intact.  And nonfocal Psychiatric: Normal judgment and insight. Alert and oriented x 3. Normal mood. Objective: Vitals:  12/01/20 2028 12/02/20 0602 12/02/20 0642 12/02/20 0908  BP: (!) 129/91 (!) 129/94    Pulse: (!) 104 98    Resp: 19 19    Temp: 98 F  (36.7 C) (!) 97.5 F (36.4 C)    TempSrc: Oral Oral    SpO2: 100% 95%  96%  Weight:   73.8 kg   Height:        Intake/Output Summary (Last 24 hours) at 12/02/2020 1523 Last data filed at 12/02/2020 1300 Gross per 24 hour  Intake 1380 ml  Output --  Net 1380 ml   Filed Weights   11/30/20 0700 12/01/20 0517 12/02/20 0642  Weight: 83.2 kg 82.5 kg 73.8 kg     Data Reviewed:   CBC: Recent Labs  Lab 11/27/20 0944 11/29/20 0921 11/30/20 0503 12/01/20 0456 12/01/20 0722 12/02/20 0518  WBC 11.0* 12.0* 8.6 10.2  --  10.7*  NEUTROABS 8.9* 10.4*  --   --   --   --   HGB 11.8* 12.0* 10.5* 7.1* 10.1* 9.9*  HCT 36.4* 36.8* 32.7* 22.4* 30.9* 30.6*  MCV 94.5 92.9 93.2 98.2  --  94.4  PLT 366 306 274 240  --  778   Basic Metabolic Panel: Recent Labs  Lab 11/27/20 0944 11/29/20 0921 11/30/20 0503 12/01/20 0456 12/02/20 0518  NA 136 135 134* 136 137  K 3.8 3.7 4.1 3.9 3.7  CL 102 98 99 102 102  CO2 23 26 28 25 27   GLUCOSE 86 96 115* 126* 110*  BUN 11 10 9 9 11   CREATININE 1.08 0.95 0.83 0.76 0.82  CALCIUM 8.3* 8.3* 7.9* 8.3* 8.3*  MG  --   --  2.0 2.2 2.3   GFR: Estimated Creatinine Clearance: 86.3 mL/min (by C-G formula based on SCr of 0.82 mg/dL). Liver Function Tests: Recent Labs  Lab 11/27/20 0944 11/29/20 0921 11/30/20 0503 12/01/20 0456 12/02/20 0518  AST 17 15 12* 29 44*  ALT 12 12 10 15  33  ALKPHOS 105 209* 161* 150* 151*  BILITOT 0.5 0.9 0.7 0.1* 0.1*  PROT 6.9 7.2 6.4* 6.4* 6.5  ALBUMIN 3.0* 2.9* 2.4* 2.5* 2.6*   Recent Labs  Lab 11/27/20 0944 11/29/20 0921 12/02/20 0518  LIPASE 128* 87* 131*   No results for input(s): AMMONIA in the last 168 hours. Coagulation Profile: No results for input(s): INR, PROTIME in the last 168 hours. Cardiac Enzymes: No results for input(s): CKTOTAL, CKMB, CKMBINDEX, TROPONINI in the last 168 hours. BNP (last 3 results) No results for input(s): PROBNP in the last 8760 hours. HbA1C: No results for input(s):  HGBA1C in the last 72 hours. CBG: No results for input(s): GLUCAP in the last 168 hours. Lipid Profile: Recent Labs    11/30/20 0503  CHOL 113  HDL 13*  LDLCALC 81  TRIG 93  CHOLHDL 8.7   Thyroid Function Tests: No results for input(s): TSH, T4TOTAL, FREET4, T3FREE, THYROIDAB in the last 72 hours. Anemia Panel: No results for input(s): VITAMINB12, FOLATE, FERRITIN, TIBC, IRON, RETICCTPCT in the last 72 hours.  Sepsis Labs: Recent Labs  Lab 11/29/20 0921  PROCALCITON 0.10    Recent Results (from the past 240 hour(s))  Resp Panel by RT-PCR (Flu A&B, Covid) Nasopharyngeal Swab     Status: None   Collection Time: 11/27/20 10:27 AM   Specimen: Nasopharyngeal Swab; Nasopharyngeal(NP) swabs in vial transport medium  Result Value Ref Range Status   SARS Coronavirus 2 by RT PCR NEGATIVE NEGATIVE Final    Comment: (  NOTE) SARS-CoV-2 target nucleic acids are NOT DETECTED.  The SARS-CoV-2 RNA is generally detectable in upper respiratory specimens during the acute phase of infection. The lowest concentration of SARS-CoV-2 viral copies this assay can detect is 138 copies/mL. A negative result does not preclude SARS-Cov-2 infection and should not be used as the sole basis for treatment or other patient management decisions. A negative result may occur with  improper specimen collection/handling, submission of specimen other than nasopharyngeal swab, presence of viral mutation(s) within the areas targeted by this assay, and inadequate number of viral copies(<138 copies/mL). A negative result must be combined with clinical observations, patient history, and epidemiological information. The expected result is Negative.  Fact Sheet for Patients:  EntrepreneurPulse.com.au  Fact Sheet for Healthcare Providers:  IncredibleEmployment.be  This test is no t yet approved or cleared by the [place] FDA and  has been authorized for detection and/or  diagnosis of SARS-CoV-2 by FDA under an Emergency Use Authorization (EUA). This EUA will remain  in effect (meaning this test can be used) for the duration of the COVID-19 declaration under Section 564(b)(1) of the Act, 21 U.S.C.section 360bbb-3(b)(1), unless the authorization is terminated  or revoked sooner.       Influenza A by PCR NEGATIVE NEGATIVE Final   Influenza B by PCR NEGATIVE NEGATIVE Final    Comment: (NOTE) The Xpert Xpress SARS-CoV-2/FLU/RSV plus assay is intended as an aid in the diagnosis of influenza from Nasopharyngeal swab specimens and should not be used as a sole basis for treatment. Nasal washings and aspirates are unacceptable for Xpert Xpress SARS-CoV-2/FLU/RSV testing.  Fact Sheet for Patients: EntrepreneurPulse.com.au  Fact Sheet for Healthcare Providers: IncredibleEmployment.be  This test is not yet approved or cleared by the [place] FDA and has been authorized for detection and/or diagnosis of SARS-CoV-2 by FDA under an Emergency Use Authorization (EUA). This EUA will remain in effect (meaning this test can be used) for the duration of the COVID-19 declaration under Section 564(b)(1) of the Act, 21 U.S.C. section 360bbb-3(b)(1), unless the authorization is terminated or revoked.  Performed at [facility], [address] [place], [place] [address]   Urine Culture     Status: Abnormal   Collection Time: 11/29/20 12:00 PM   Specimen: Urine, Clean Catch  Result Value Ref Range Status   Specimen Description   Final    URINE, CLEAN CATCH Performed at [facility], [address], [place], [place] [address]    Special Requests   Final    NONE Performed at [facility], [address], [place], [place] [address]    Culture MULTIPLE SPECIES PRESENT, SUGGEST RECOLLECTION (A)  Final   Report Status 12/01/2020 FINAL  Final  Resp Panel by RT-PCR (Flu A&B, Covid)     Status: None   Collection Time: 11/29/20 12:08  PM   Specimen: Nasopharyngeal(NP) swabs in vial transport medium  Result Value Ref Range Status   SARS Coronavirus 2 by RT PCR NEGATIVE NEGATIVE Final    Comment: (NOTE) SARS-CoV-2 target nucleic acids are NOT DETECTED.  The SARS-CoV-2 RNA is generally detectable in upper respiratory specimens during the acute phase of infection. The lowest concentration of SARS-CoV-2 viral copies this assay can detect is 138 copies/mL. A negative result does not preclude SARS-Cov-2 infection and should not be used as the sole basis for treatment or other patient management decisions. A negative result may occur with  improper specimen collection/handling, submission of specimen other than nasopharyngeal swab, presence of viral mutation(s) within  the areas targeted by this assay, and inadequate number of viral copies(<138 copies/mL). A negative result must be combined with clinical observations, patient history, and epidemiological information. The expected result is Negative.  Fact Sheet for Patients:  EntrepreneurPulse.com.au  Fact Sheet for Healthcare Providers:  IncredibleEmployment.be  This test is no t yet approved or cleared by the [place] FDA and  has been authorized for detection and/or diagnosis of SARS-CoV-2 by FDA under an Emergency Use Authorization (EUA). This EUA will remain  in effect (meaning this test can be used) for the duration of the COVID-19 declaration under Section 564(b)(1) of the Act, 21 U.S.C.section 360bbb-3(b)(1), unless the authorization is terminated  or revoked sooner.       Influenza A by PCR NEGATIVE NEGATIVE Final   Influenza B by PCR NEGATIVE NEGATIVE Final    Comment: (NOTE) The Xpert Xpress SARS-CoV-2/FLU/RSV plus assay is intended as an aid in the diagnosis of influenza from Nasopharyngeal swab specimens and should not be used as a sole basis for treatment. Nasal washings and aspirates are unacceptable for Xpert  Xpress SARS-CoV-2/FLU/RSV testing.  Fact Sheet for Patients: EntrepreneurPulse.com.au  Fact Sheet for Healthcare Providers: IncredibleEmployment.be  This test is not yet approved or cleared by the [place] FDA and has been authorized for detection and/or diagnosis of SARS-CoV-2 by FDA under an Emergency Use Authorization (EUA). This EUA will remain in effect (meaning this test can be used) for the duration of the COVID-19 declaration under Section 564(b)(1) of the Act, 21 U.S.C. section 360bbb-3(b)(1), unless the authorization is terminated or revoked.  Performed at [facility], [address], [place], [place] [address]          Radiology Studies: DG Abd 1 View  Result Date: 12/01/2020 CLINICAL DATA:  Abdominal pain and history of pancreatitis EXAM: ABDOMEN - 1 VIEW COMPARISON:  CT from 11/29/2020 FINDINGS: Scattered large and small bowel gas is noted. Multiple edematous loops of small bowel are noted without significant gas within. No true obstructive changes are seen. No free air is noted. Nonobstructing right renal stone is noted stable in appearance from the prior exam. Chronic changes about the proximal left hip are seen. Degenerative changes of lumbar spine are noted. IMPRESSION: Edematous loops of small bowel likely related to the underlying pancreatitis. No true obstructive changes are seen at this time. Stable right renal stone. Electronically Signed   By: Inez Catalina M.D.   On: 12/01/2020 15:08        Scheduled Meds:  azithromycin  500 mg Oral Daily   budesonide (PULMICORT) nebulizer solution  0.5 mg Nebulization BID   enoxaparin (LOVENOX) injection  40 mg Subcutaneous Q24H   ferrous sulfate  325 mg Oral Q breakfast   folic acid  1 mg Oral Daily   furosemide  20 mg Oral Daily   gabapentin  300 mg Oral BID   ipratropium-albuterol  3 mL Nebulization BID   lipase/protease/amylase  36,000 Units Oral TID AC   multivitamin with  minerals  1 tablet Oral Daily   pantoprazole  40 mg Oral Daily   predniSONE  40 mg Oral Q breakfast   senna-docusate  1 tablet Oral QHS   thiamine  100 mg Oral Daily   Or   thiamine  100 mg Intravenous Daily   Continuous Infusions:  cefTRIAXone (ROCEPHIN)  IV Stopped (12/01/20 1228)     LOS: 3 days   Time spent= 35 mins    Azariah Latendresse Arsenio Loader, MD [facility]  If  7PM-7AM, please contact night-coverage  12/02/2020, 3:23 PM

## 2020-12-02 NOTE — Telephone Encounter (Signed)
Thank you for update Patty. I believe the patient is a primary patient of Dr. Tarri Glenn. I am happy to see the patient in follow-up to determine what follow-up will be required from a pancreatitis perspective. He needs a pancreas protocol CT abdomen/pelvis to be performed a few days before the clinic visit. Based on the findings we will determine potential role for EUS +/- EUS's gastrostomy +/- ERCP. Thanks. GM

## 2020-12-02 NOTE — Progress Notes (Signed)
Subjective:  Patient complains of upper abdominal pain radiating into back. Feels little worse today. Did not eat lunch. Drinking liquids. Does not feel like putting a lot of food on his stomach. No vomiting. Had BM this morning. No melena, brbpr.   Objective: Vital signs in last 24 hours: Temp:  [97.5 F (36.4 C)-98 F (36.7 C)] 97.5 F (36.4 C) (09/22 0602) Pulse Rate:  [98-106] 98 (09/22 0602) Resp:  [19-20] 19 (09/22 0602) BP: (129-136)/(91-94) 129/94 (09/22 0602) SpO2:  [95 %-100 %] 96 % (09/22 0908) Weight:  [73.8 kg] 73.8 kg (09/22 0642) Last BM Date: 12/02/20 General:   Alert,  Well-developed, well-nourished, pleasant and cooperative in NAD Head:  Normocephalic and atraumatic. Eyes:  Sclera clear, no icterus.  Abdomen:  Soft, nondistended.   Normal bowel sounds, without guarding, and without rebound.  Moderate epigastric tenderness. Extremities:  Without clubbing, deformity or edema. Neurologic:  Alert and  oriented x4;  grossly normal neurologically. Skin:  Intact without significant lesions or rashes. Psych:  Alert and cooperative. Normal mood and affect.  Intake/Output from previous day: 09/21 0701 - 09/22 0700 In: 1380 [P.O.:1080; IV Piggyback:300] Out: -  Intake/Output this shift: Total I/O In: 480 [P.O.:480] Out: -   Lab Results: CBC Recent Labs    11/30/20 0503 12/01/20 0456 12/01/20 0722 12/02/20 0518  WBC 8.6 10.2  --  10.7*  HGB 10.5* 7.1* 10.1* 9.9*  HCT 32.7* 22.4* 30.9* 30.6*  MCV 93.2 98.2  --  94.4  PLT 274 240  --  319   BMET Recent Labs    11/30/20 0503 12/01/20 0456 12/02/20 0518  NA 134* 136 137  K 4.1 3.9 3.7  CL 99 102 102  CO2 28 25 27   GLUCOSE 115* 126* 110*  BUN 9 9 11   CREATININE 0.83 0.76 0.82  CALCIUM 7.9* 8.3* 8.3*   LFTs Recent Labs    11/30/20 0503 12/01/20 0456 12/02/20 0518  BILITOT 0.7 0.1* 0.1*  ALKPHOS 161* 150* 151*  AST 12* 29 44*  ALT 10 15 33  PROT 6.4* 6.4* 6.5  ALBUMIN 2.4* 2.5* 2.6*   Recent  Labs    12/02/20 0518  LIPASE 131*   PT/INR No results for input(s): LABPROT, INR in the last 72 hours.    Imaging Studies: DG Chest 2 View  Result Date: 11/27/2020 CLINICAL DATA:  Productive cough and shortness of breath. EXAM: CHEST - 2 VIEW COMPARISON:  10/26/2020 FINDINGS: 0949 hours. Low volume film. The cardio pericardial silhouette is enlarged. Interstitial markings are diffusely coarsened with chronic features. Atelectasis or linear scarring noted right mid lung. Telemetry leads overlie the chest. IMPRESSION: Low volume film without acute cardiopulmonary findings. Electronically Signed   By: Misty Stanley M.D.   On: 11/27/2020 10:51   DG Abd 1 View  Result Date: 12/01/2020 CLINICAL DATA:  Abdominal pain and history of pancreatitis EXAM: ABDOMEN - 1 VIEW COMPARISON:  CT from 11/29/2020 FINDINGS: Scattered large and small bowel gas is noted. Multiple edematous loops of small bowel are noted without significant gas within. No true obstructive changes are seen. No free air is noted. Nonobstructing right renal stone is noted stable in appearance from the prior exam. Chronic changes about the proximal left hip are seen. Degenerative changes of lumbar spine are noted. IMPRESSION: Edematous loops of small bowel likely related to the underlying pancreatitis. No true obstructive changes are seen at this time. Stable right renal stone. Electronically Signed   By: Linus Mako.D.  On: 12/01/2020 15:08   CT Abdomen Pelvis W Contrast  Result Date: 11/29/2020 CLINICAL DATA:  Persistent pancreatitis EXAM: CT ABDOMEN AND PELVIS WITH CONTRAST TECHNIQUE: Multidetector CT imaging of the abdomen and pelvis was performed using the standard protocol following bolus administration of intravenous contrast. Sagittal and coronal MPR images reconstructed from axial data set. CONTRAST:  78mL OMNIPAQUE IOHEXOL 350 MG/ML SOLN IV. No oral contrast. COMPARISON:  10/24/2020 FINDINGS: Lower chest: Minimal bibasilar  atelectasis Hepatobiliary: Gallbladder distended with small amount of adjacent fluid, though ascites is seen elsewhere in the upper abdomen as well. No calcifications. Mild intrahepatic and extrahepatic biliary dilatation, with CBD measuring 11 mm Pancreas: Parenchymal atrophy and ductal dilatation. Surrounding edema. Multiple fluid collections consistent with pseudocysts. Fluid collection anterior to proximal pancreatic tail 5.6 x 3.6 cm image 22. Reverse C-shaped collection inferior to pancreatic body and proximal tail 7.3 x 5.3 cm image 34. Collection adjacent to pancreatic body 3.2 x 2.3 cm image 28. Small fluid collection anteriorly in upper abdomen 3.7 x 1.8 cm image 28, potentially communicating with the reverse C-shaped collection. Spleen: Normal appearance Adrenals/Urinary Tract: Adrenal glands normal appearance. Small BILATERAL renal cysts. Large calculus at inferior pole RIGHT kidney 16 x 12 mm image 39. Kidneys, ureters, and bladder otherwise normal appearance. Stomach/Bowel: Appendix not visualized. Diverticulosis of distal colon without evidence of diverticulitis. Stomach decompressed. Bowel loops otherwise unremarkable. Vascular/Lymphatic: Atherosclerotic calcifications aorta and iliac arteries without aneurysm. Enlargement of cardiac chambers. No adenopathy. Reproductive: Prostate gland normal size. Seminal vesicles unremarkable. Other: Scattered ascites.  No free air.  No hernia. Musculoskeletal: Old BILATERAL pelvic fractures. Old proximal LEFT femoral fracture with deformity. IMPRESSION: Acute pancreatitis with multiple peripancreatic pseudocysts as above, majority new since the prior exam though these correspond in position with areas of inflammation/phlegmon seen on the previous exam. Mild biliary dilatation with CBD measuring 11 mm, recommend correlation with LFTs. Distal colonic diverticulosis without evidence of diverticulitis. Nonobstructing RIGHT renal calculus 16 x 12 mm. Aortic  Atherosclerosis (ICD10-I70.0). Electronically Signed   By: Lavonia Dana M.D.   On: 11/29/2020 11:38   MR 3D Recon At Scanner  Result Date: 11/29/2020 CLINICAL DATA:  70 year old male with history of pancreatitis. Biliary ductal dilatation. EXAM: MRI ABDOMEN WITHOUT AND WITH CONTRAST (INCLUDING MRCP) TECHNIQUE: Multiplanar multisequence MR imaging of the abdomen was performed both before and after the administration of intravenous contrast. Heavily T2-weighted images of the biliary and pancreatic ducts were obtained, and three-dimensional MRCP images were rendered by post processing. CONTRAST:  78mL GADAVIST GADOBUTROL 1 MMOL/ML IV SOLN COMPARISON:  No prior abdominal MRI. CT the abdomen and pelvis 11/29/2020. FINDINGS: Lower chest: Unremarkable. Hepatobiliary: No suspicious cystic or solid hepatic lesions. There is mild intrahepatic biliary ductal dilatation. Common hepatic duct is dilated measuring up to 15 mm in diameter. Common bile duct is mildly dilated measuring up to 8 mm in diameter. MRCP images are suboptimal but demonstrate a potential filling defect in the distal common bile duct estimated to measure approximately 5 mm (coronal image 56 of series 11). However, this cannot be confirmed on additional T2 weighted sequences. There are no filling defects within the gallbladder to suggest cholelithiasis. Gallbladder is moderately distended. Gallbladder wall thickness appears normal. Pancreas: Pancreatic ductal dilatation is again noted throughout the body and tail of the pancreas, measuring up to 9 mm in the body. This ductal dilatation abruptly terminates in the region of the pancreatic head. In this region, no discrete pancreatic mass is confidently identified on today's examination. There is  atrophy in the distal body and tail of the pancreas. Several complex fluid collections emanate from the pancreas and extend into the root of the small bowel mesentery, demonstrating heterogeneous signal intensity on  T1 and T2 weighted images, compatible with pancreatic pseudocysts. The largest of these is complex in shape and therefore difficult to measure, but estimated measure approximately 6.2 x 3.0 x 5.5 cm (axial image 24 of series 4 and coronal image 10 of series 5). Spleen:  Unremarkable. Adrenals/Urinary Tract: Multiple small T1 hypointense, T2 hyperintense, nonenhancing lesions are noted in both kidneys, compatible with simple cysts, measuring 1.3 cm or less in size. No definite solid renal lesions are noted. No hydroureteronephrosis in the visualized portions of the abdomen. Bilateral adrenal glands are normal in appearance. Stomach/Bowel: Unremarkable. Vascular/Lymphatic: No aneurysm identified in the visualized abdominal vasculature. Splenic vein, superior mesenteric vein, splenoportal confluence and proximal portal vein are all narrowed secondary to mass effect from the acute pancreatitis. Other:  Small volume of ascites most evident adjacent to the liver. Musculoskeletal: No aggressive appearing osseous lesions are noted in the visualized portions of the skeleton. IMPRESSION: 1. MRCP images are suboptimal, but suggest the possibility of choledocholithiasis in the distal common bile duct. Notably, however, this could not be confirmed on additional T2 weighted sequences, and would not explain the persistent pancreatic ductal dilatation seen on today's examination. The possibility of a pancreatic head mass is considered, although no discrete mass is confidently identified on today's examination which is confounded by the presence of acute pancreatitis with multiple complex pseudocysts, as discussed above. Electronically Signed   By: Vinnie Langton M.D.   On: 11/29/2020 20:59   DG Chest Port 1 View  Result Date: 11/29/2020 CLINICAL DATA:  Cough, dyspnea, abdominal pain, pancreatitis EXAM: PORTABLE CHEST 1 VIEW COMPARISON:  11/27/2020 chest radiograph. FINDINGS: Stable cardiomediastinal silhouette with mild  cardiomegaly. No pneumothorax. No pleural effusion. Lungs appear clear, with no acute consolidative airspace disease and no pulmonary edema. IMPRESSION: Mild cardiomegaly without pulmonary edema. No active pulmonary disease. Electronically Signed   By: Ilona Sorrel M.D.   On: 11/29/2020 12:26   MR ABDOMEN MRCP W WO CONTAST  Result Date: 11/29/2020 CLINICAL DATA:  70 year old male with history of pancreatitis. Biliary ductal dilatation. EXAM: MRI ABDOMEN WITHOUT AND WITH CONTRAST (INCLUDING MRCP) TECHNIQUE: Multiplanar multisequence MR imaging of the abdomen was performed both before and after the administration of intravenous contrast. Heavily T2-weighted images of the biliary and pancreatic ducts were obtained, and three-dimensional MRCP images were rendered by post processing. CONTRAST:  87mL GADAVIST GADOBUTROL 1 MMOL/ML IV SOLN COMPARISON:  No prior abdominal MRI. CT the abdomen and pelvis 11/29/2020. FINDINGS: Lower chest: Unremarkable. Hepatobiliary: No suspicious cystic or solid hepatic lesions. There is mild intrahepatic biliary ductal dilatation. Common hepatic duct is dilated measuring up to 15 mm in diameter. Common bile duct is mildly dilated measuring up to 8 mm in diameter. MRCP images are suboptimal but demonstrate a potential filling defect in the distal common bile duct estimated to measure approximately 5 mm (coronal image 56 of series 11). However, this cannot be confirmed on additional T2 weighted sequences. There are no filling defects within the gallbladder to suggest cholelithiasis. Gallbladder is moderately distended. Gallbladder wall thickness appears normal. Pancreas: Pancreatic ductal dilatation is again noted throughout the body and tail of the pancreas, measuring up to 9 mm in the body. This ductal dilatation abruptly terminates in the region of the pancreatic head. In this region, no discrete pancreatic mass is  confidently identified on today's examination. There is atrophy in the  distal body and tail of the pancreas. Several complex fluid collections emanate from the pancreas and extend into the root of the small bowel mesentery, demonstrating heterogeneous signal intensity on T1 and T2 weighted images, compatible with pancreatic pseudocysts. The largest of these is complex in shape and therefore difficult to measure, but estimated measure approximately 6.2 x 3.0 x 5.5 cm (axial image 24 of series 4 and coronal image 10 of series 5). Spleen:  Unremarkable. Adrenals/Urinary Tract: Multiple small T1 hypointense, T2 hyperintense, nonenhancing lesions are noted in both kidneys, compatible with simple cysts, measuring 1.3 cm or less in size. No definite solid renal lesions are noted. No hydroureteronephrosis in the visualized portions of the abdomen. Bilateral adrenal glands are normal in appearance. Stomach/Bowel: Unremarkable. Vascular/Lymphatic: No aneurysm identified in the visualized abdominal vasculature. Splenic vein, superior mesenteric vein, splenoportal confluence and proximal portal vein are all narrowed secondary to mass effect from the acute pancreatitis. Other:  Small volume of ascites most evident adjacent to the liver. Musculoskeletal: No aggressive appearing osseous lesions are noted in the visualized portions of the skeleton. IMPRESSION: 1. MRCP images are suboptimal, but suggest the possibility of choledocholithiasis in the distal common bile duct. Notably, however, this could not be confirmed on additional T2 weighted sequences, and would not explain the persistent pancreatic ductal dilatation seen on today's examination. The possibility of a pancreatic head mass is considered, although no discrete mass is confidently identified on today's examination which is confounded by the presence of acute pancreatitis with multiple complex pseudocysts, as discussed above. Electronically Signed   By: Vinnie Langton M.D.   On: 11/29/2020 20:59   ECHOCARDIOGRAM COMPLETE  Result Date:  11/29/2020    ECHOCARDIOGRAM REPORT   Patient Name:   Ryan [name] Date of Exam: 11/29/2020 Medical Rec #:  [id number]       Height:       71.0 in Accession #:    [id number]      Weight:       188.0 lb Date of Birth:  04-05-1950       BSA:          2.054 m Patient Age:    64 years        BP:           148/83 mmHg Patient Gender: M               HR:           98 bpm. Exam Location:  [facility] Procedure: 2D Echo, Cardiac Doppler and Color Doppler Indications:    Cardiomyopathy  History:        Patient has prior history of Echocardiogram examinations, most                 recent 10/16/2018. CHF, COPD; Risk Factors:Hypertension and                 Dyslipidemia. Nonischemic cardiomyopathy, Tobacco abuse.  Sonographer:    Wenda Low Referring Phys: [id number] ANKIT CHIRAG AMIN IMPRESSIONS  1. Global hypokinesis. The anterior, anteroseptal walls are akinetic. Marland Kitchen Left ventricular ejection fraction, by estimation, is 15-20%. The left ventricle has severely decreased function. The left ventricle demonstrates global hypokinesis. The left ventricular internal cavity size was severely dilated. Left ventricular diastolic parameters are indeterminate.  2. Right ventricular systolic function is normal. The right ventricular size is normal.  3. Left atrial size was severely dilated.  4. The mitral valve is normal in structure. Trivial mitral valve regurgitation. No evidence of mitral stenosis.  5. The aortic valve is tricuspid. Aortic valve regurgitation is not visualized. No aortic stenosis is present. FINDINGS  Left Ventricle: Global hypokinesis. The anterior, anteroseptal walls are akinetic. Left ventricular ejection fraction, by estimation, is 15-20%. The left ventricle has severely decreased function. The left ventricle demonstrates global hypokinesis. The left ventricular internal cavity size was severely dilated. There is no left ventricular hypertrophy. Left ventricular diastolic parameters are indeterminate. Right  Ventricle: The right ventricular size is normal. No increase in right ventricular wall thickness. Right ventricular systolic function is normal. Left Atrium: Left atrial size was severely dilated. Right Atrium: Right atrial size was normal in size. Pericardium: There is no evidence of pericardial effusion. Mitral Valve: The mitral valve is normal in structure. Trivial mitral valve regurgitation. No evidence of mitral valve stenosis. MV peak gradient, 3.1 mmHg. The mean mitral valve gradient is 1.0 mmHg. Tricuspid Valve: The tricuspid valve is not well visualized. Tricuspid valve regurgitation is not demonstrated. No evidence of tricuspid stenosis. Aortic Valve: The aortic valve is tricuspid. Aortic valve regurgitation is not visualized. No aortic stenosis is present. Aortic valve mean gradient measures 3.0 mmHg. Aortic valve peak gradient measures 4.8 mmHg. Aortic valve area, by VTI measures 3.28 cm. Pulmonic Valve: The pulmonic valve was not well visualized. Pulmonic valve regurgitation is not visualized. No evidence of pulmonic stenosis. Aorta: The aortic root is normal in size and structure. IAS/Shunts: The interatrial septum was not well visualized.  LEFT VENTRICLE PLAX 2D LVIDd:         7.00 cm      Diastology LVIDs:         6.50 cm      LV e' medial:    4.68 cm/s LV PW:         1.10 cm      LV E/e' medial:  17.8 LV IVS:        0.90 cm      LV e' lateral:   10.40 cm/s LVOT diam:     2.00 cm      LV E/e' lateral: 8.0 LV SV:         58 LV SV Index:   28 LVOT Area:     3.14 cm  LV Volumes (MOD) LV vol d, MOD A2C: 288.0 ml LV vol d, MOD A4C: 275.0 ml LV vol s, MOD A2C: 238.0 ml LV vol s, MOD A4C: 199.0 ml LV SV MOD A2C:     50.0 ml LV SV MOD A4C:     275.0 ml LV SV MOD BP:      62.3 ml RIGHT VENTRICLE RV Basal diam:  3.55 cm RV Mid diam:    3.10 cm RV S prime:     13.50 cm/s TAPSE (M-mode): 1.8 cm LEFT ATRIUM              Index       RIGHT ATRIUM           Index LA diam:        4.30 cm  2.09 cm/m  RA Area:      19.30 cm LA Vol (A2C):   113.0 ml 55.02 ml/m RA Volume:   54.80 ml  26.68 ml/m LA Vol (A4C):   126.0 ml 61.35 ml/m LA Biplane Vol: 124.0 ml 60.37 ml/m  AORTIC VALVE  PULMONIC VALVE AV Area (Vmax):    3.23 cm    PV Vmax:       0.80 m/s AV Area (Vmean):   2.89 cm    PV Peak grad:  2.6 mmHg AV Area (VTI):     3.28 cm AV Vmax:           109.00 cm/s AV Vmean:          74.400 cm/s AV VTI:            0.176 m AV Peak Grad:      4.8 mmHg AV Mean Grad:      3.0 mmHg LVOT Vmax:         112.00 cm/s LVOT Vmean:        68.400 cm/s LVOT VTI:          0.184 m LVOT/AV VTI ratio: 1.05  AORTA Ao Root diam: 3.60 cm Ao Asc diam:  3.50 cm MITRAL VALVE MV Area (PHT): 5.42 cm    SHUNTS MV Area VTI:   4.62 cm    Systemic VTI:  0.18 m MV Peak grad:  3.1 mmHg    Systemic Diam: 2.00 cm MV Mean grad:  1.0 mmHg MV Vmax:       0.88 m/s MV Vmean:      52.5 cm/s MV Decel Time: 140 msec MV E velocity: 83.10 cm/s MV A velocity: 62.10 cm/s MV E/A ratio:  1.34 Carlyle Dolly MD Electronically signed by Carlyle Dolly MD Signature Date/Time: 11/29/2020/4:25:37 PM    Final   [2 weeks]   Assessment: 70 year old male with past medical history of CHF with EF of 15 to 20%, CKD, polysubstance abuse, GERD, colonic tubular adenoma with localized high-grade dysplasia December 2019, recurrent suspected alcohol induced pancreatitis with multiple admissions/presentations to the ED who presented with abdominal pain.  GI consulted for acute pancreatitis with peripancreatic pseudocyst, mild biliary and pancreatic duct dilation.  Pancreatitis: Acute on chronic due to excessive alcohol use previously, quit alcohol 2 months ago.  LFTs normal except for alkaline phosphatase which is mildly elevated, 209 initially, down to 151 today.  AST mildly elevated today at 44, had been normal.  Lipase minimally elevated.  CT suggested acute pancreatitis with multiple peripancreatic pseudocyst (2 of these measures over 5 cm) likely from prior  pancreatitis.  Noted to have biliary and pancreatic ductal dilatation and pancreatic atrophy.  Discussed with Dr. Laural Golden, suspected to have pancreatic ductal stone as well.  Triglycerides 93.  MRI/MRCP with mild intrahepatic biliary ductal dilation, common hepatic duct dilated up to 15 mm, CBD dilated up to 8 mm.  Potential filling defect noted in the distal common bile duct approximately 5 mm, unable to confirm.  No gallbladder defects to suggest cholelithiasis.  No pancreatic mass definitively identified.  Several complex fluid collections in the pancreas.  Pancreatic biliary duct dilation have been present since at least November 2021 as well as concern for possible pancreatic head mass on prior studies.  Plans for EGD/EUS with FNA in January 2022 at [facility] clinic however patient left AMA.  Dysphagia: Chronic, intermittent pill dysphagia with large pills, no issues with solids or liquids.  Chronically on PPI daily.  No significant GERD symptoms.  Consider outpatient EGD.  Anemia: Hemoglobin 12 on admission, down to 10.5 the following day.  Hemoglobin reported to be 7.1 on September 21 but hemoglobin repeated and was 10. No transfusion. Hgb 9.9 today.  Iron studies suggest IDA.  Serum ferritin is normal due to acute  phase reactant.  B12 and folate normal.  No overt GI bleeding.  No NSAIDs.  Last colonoscopy December 2019, due this year.  Last EGD November 2019 with congested duodenal mucosa.  If stool heme positive, would consider EGD this admission.   Plan: Dr. Laural Golden spoke to Dr. Rush Landmark regarding case. Plans for outpatient follow up with them in their office in one month. Will decide appropriate course of action regarding EUS+/-ERCP.  Continue Creon with meals and snacks. 36000 units 1-2 with meals and 1 with snacks.  Continue PPI daily.  He will be due for colonoscopy later this year with his primary GI. Timing of discharged based on adequate nutrition and pain management. At this time it is  not clear that he is consuming enough orally. He did better with full liquids, but diet advanced yesterday. He did not eat off his lunch tray (I saw tray prior to nutrition picking it up).    Laureen Ochs. Bernarda Caffey [facility] [phone] 9/22/20222:48 PM     LOS: 3 days

## 2020-12-02 NOTE — Telephone Encounter (Signed)
The pt significant other has been given the appt information and address.

## 2020-12-02 NOTE — Telephone Encounter (Signed)
-----   Message from Rogene Houston, MD sent at 12/02/2020 11:39 AM EDT ----- Madelon Lips,  This is the patient being discussed this morning. He has chronic pancreatitis with multiple pseudocysts and will need to help. Please arrange for office visit in 4 weeks as recommended. He will definitely need EUS and may also need ERCP at some point. Thank you  Bernadene Person

## 2020-12-03 ENCOUNTER — Other Ambulatory Visit: Payer: Self-pay

## 2020-12-03 DIAGNOSIS — N182 Chronic kidney disease, stage 2 (mild): Secondary | ICD-10-CM | POA: Diagnosis not present

## 2020-12-03 DIAGNOSIS — K859 Acute pancreatitis without necrosis or infection, unspecified: Secondary | ICD-10-CM | POA: Diagnosis not present

## 2020-12-03 DIAGNOSIS — J410 Simple chronic bronchitis: Secondary | ICD-10-CM | POA: Diagnosis not present

## 2020-12-03 DIAGNOSIS — K863 Pseudocyst of pancreas: Secondary | ICD-10-CM

## 2020-12-03 DIAGNOSIS — K861 Other chronic pancreatitis: Secondary | ICD-10-CM

## 2020-12-03 DIAGNOSIS — I5022 Chronic systolic (congestive) heart failure: Secondary | ICD-10-CM | POA: Diagnosis not present

## 2020-12-03 LAB — MAGNESIUM: Magnesium: 2.1 mg/dL (ref 1.7–2.4)

## 2020-12-03 LAB — CBC
HCT: 29.6 % — ABNORMAL LOW (ref 39.0–52.0)
Hemoglobin: 9.7 g/dL — ABNORMAL LOW (ref 13.0–17.0)
MCH: 30.6 pg (ref 26.0–34.0)
MCHC: 32.8 g/dL (ref 30.0–36.0)
MCV: 93.4 fL (ref 80.0–100.0)
Platelets: 313 10*3/uL (ref 150–400)
RBC: 3.17 MIL/uL — ABNORMAL LOW (ref 4.22–5.81)
RDW: 17.2 % — ABNORMAL HIGH (ref 11.5–15.5)
WBC: 10.4 10*3/uL (ref 4.0–10.5)
nRBC: 0.2 % (ref 0.0–0.2)

## 2020-12-03 MED ORDER — CARVEDILOL 3.125 MG PO TABS
6.2500 mg | ORAL_TABLET | Freq: Two times a day (BID) | ORAL | Status: DC
  Administered 2020-12-03 – 2020-12-07 (×8): 6.25 mg via ORAL
  Filled 2020-12-03 (×9): qty 2

## 2020-12-03 MED ORDER — PANCRELIPASE (LIP-PROT-AMYL) 12000-38000 UNITS PO CPEP
36000.0000 [IU] | ORAL_CAPSULE | ORAL | Status: DC
  Administered 2020-12-03 – 2020-12-06 (×4): 36000 [IU] via ORAL
  Filled 2020-12-03 (×2): qty 3

## 2020-12-03 MED ORDER — PANCRELIPASE (LIP-PROT-AMYL) 12000-38000 UNITS PO CPEP
72000.0000 [IU] | ORAL_CAPSULE | Freq: Three times a day (TID) | ORAL | Status: DC
  Administered 2020-12-03 – 2020-12-07 (×12): 72000 [IU] via ORAL
  Filled 2020-12-03 (×12): qty 6

## 2020-12-03 MED ORDER — SODIUM CHLORIDE 0.9 % IV BOLUS
500.0000 mL | Freq: Once | INTRAVENOUS | Status: AC
  Administered 2020-12-03: 500 mL via INTRAVENOUS

## 2020-12-03 NOTE — Care Management Important Message (Signed)
Important Message  Patient Details  Name: Ryan [name] MRN: [id number] Date of Birth: Feb 08, 1951   Medicare Important Message Given:  Yes     Tommy Medal 12/03/2020, 11:56 AM

## 2020-12-03 NOTE — Progress Notes (Signed)
Subjective:  Complains of pp abdominal pain. Hospitalist changed diet back to full liquids after BF this morning. BM today, dark. No melena, brbpr. No n/v. Didn't rest well last night.   Objective: Vital signs in last 24 hours: Temp:  [97.8 F (36.6 C)-98.1 F (36.7 C)] 97.8 F (36.6 C) (09/23 0944) Pulse Rate:  [87-111] 111 (09/23 0944) Resp:  [18-22] 20 (09/23 0944) BP: (126-146)/(85-101) 129/87 (09/23 0944) SpO2:  [98 %-100 %] 100 % (09/23 0944) Weight:  [78.5 kg] 78.5 kg (09/23 0500) Last BM Date: 12/02/20 General:   Alert,  Well-developed, well-nourished, pleasant and cooperative in NAD Head:  Normocephalic and atraumatic. Eyes:  Sclera clear, no icterus.  Abdomen:  Soft,  nondistended.  Normal bowel sounds, without guarding, and without rebound.  Mild epigastric tenderness Extremities:  Without clubbing, deformity or edema. Neurologic:  Alert and  oriented x4;  grossly normal neurologically. Skin:  Intact without significant lesions or rashes. Psych:  Alert and cooperative. Normal mood and affect.  Intake/Output from previous day: 09/22 0701 - 09/23 0700 In: 960 [P.O.:960] Out: 950 [Urine:950] Intake/Output this shift: Total I/O In: 240 [P.O.:240] Out: 300 [Urine:300]  Lab Results: CBC Recent Labs    12/01/20 0456 12/01/20 0722 12/02/20 0518 12/03/20 0514  WBC 10.2  --  10.7* 10.4  HGB 7.1* 10.1* 9.9* 9.7*  HCT 22.4* 30.9* 30.6* 29.6*  MCV 98.2  --  94.4 93.4  PLT 240  --  319 313   BMET Recent Labs    12/01/20 0456 12/02/20 0518  NA 136 137  K 3.9 3.7  CL 102 102  CO2 25 27  GLUCOSE 126* 110*  BUN 9 11  CREATININE 0.76 0.82  CALCIUM 8.3* 8.3*   LFTs Recent Labs    12/01/20 0456 12/02/20 0518  BILITOT 0.1* 0.1*  ALKPHOS 150* 151*  AST 29 44*  ALT 15 33  PROT 6.4* 6.5  ALBUMIN 2.5* 2.6*   Recent Labs    12/02/20 0518  LIPASE 131*   PT/INR No results for input(s): LABPROT, INR in the last 72 hours.    Imaging Studies: DG Chest 2  View  Result Date: 11/27/2020 CLINICAL DATA:  Productive cough and shortness of breath. EXAM: CHEST - 2 VIEW COMPARISON:  10/26/2020 FINDINGS: 0949 hours. Low volume film. The cardio pericardial silhouette is enlarged. Interstitial markings are diffusely coarsened with chronic features. Atelectasis or linear scarring noted right mid lung. Telemetry leads overlie the chest. IMPRESSION: Low volume film without acute cardiopulmonary findings. Electronically Signed   By: Misty Stanley M.D.   On: 11/27/2020 10:51   DG Abd 1 View  Result Date: 12/01/2020 CLINICAL DATA:  Abdominal pain and history of pancreatitis EXAM: ABDOMEN - 1 VIEW COMPARISON:  CT from 11/29/2020 FINDINGS: Scattered large and small bowel gas is noted. Multiple edematous loops of small bowel are noted without significant gas within. No true obstructive changes are seen. No free air is noted. Nonobstructing right renal stone is noted stable in appearance from the prior exam. Chronic changes about the proximal left hip are seen. Degenerative changes of lumbar spine are noted. IMPRESSION: Edematous loops of small bowel likely related to the underlying pancreatitis. No true obstructive changes are seen at this time. Stable right renal stone. Electronically Signed   By: Inez Catalina M.D.   On: 12/01/2020 15:08   CT Abdomen Pelvis W Contrast  Result Date: 11/29/2020 CLINICAL DATA:  Persistent pancreatitis EXAM: CT ABDOMEN AND PELVIS WITH CONTRAST TECHNIQUE: Multidetector CT imaging of  the abdomen and pelvis was performed using the standard protocol following bolus administration of intravenous contrast. Sagittal and coronal MPR images reconstructed from axial data set. CONTRAST:  35mL OMNIPAQUE IOHEXOL 350 MG/ML SOLN IV. No oral contrast. COMPARISON:  10/24/2020 FINDINGS: Lower chest: Minimal bibasilar atelectasis Hepatobiliary: Gallbladder distended with small amount of adjacent fluid, though ascites is seen elsewhere in the upper abdomen as well.  No calcifications. Mild intrahepatic and extrahepatic biliary dilatation, with CBD measuring 11 mm Pancreas: Parenchymal atrophy and ductal dilatation. Surrounding edema. Multiple fluid collections consistent with pseudocysts. Fluid collection anterior to proximal pancreatic tail 5.6 x 3.6 cm image 22. Reverse C-shaped collection inferior to pancreatic body and proximal tail 7.3 x 5.3 cm image 34. Collection adjacent to pancreatic body 3.2 x 2.3 cm image 28. Small fluid collection anteriorly in upper abdomen 3.7 x 1.8 cm image 28, potentially communicating with the reverse C-shaped collection. Spleen: Normal appearance Adrenals/Urinary Tract: Adrenal glands normal appearance. Small BILATERAL renal cysts. Large calculus at inferior pole RIGHT kidney 16 x 12 mm image 39. Kidneys, ureters, and bladder otherwise normal appearance. Stomach/Bowel: Appendix not visualized. Diverticulosis of distal colon without evidence of diverticulitis. Stomach decompressed. Bowel loops otherwise unremarkable. Vascular/Lymphatic: Atherosclerotic calcifications aorta and iliac arteries without aneurysm. Enlargement of cardiac chambers. No adenopathy. Reproductive: Prostate gland normal size. Seminal vesicles unremarkable. Other: Scattered ascites.  No free air.  No hernia. Musculoskeletal: Old BILATERAL pelvic fractures. Old proximal LEFT femoral fracture with deformity. IMPRESSION: Acute pancreatitis with multiple peripancreatic pseudocysts as above, majority new since the prior exam though these correspond in position with areas of inflammation/phlegmon seen on the previous exam. Mild biliary dilatation with CBD measuring 11 mm, recommend correlation with LFTs. Distal colonic diverticulosis without evidence of diverticulitis. Nonobstructing RIGHT renal calculus 16 x 12 mm. Aortic Atherosclerosis (ICD10-I70.0). Electronically Signed   By: Lavonia Dana M.D.   On: 11/29/2020 11:38   MR 3D Recon At Scanner  Result Date:  11/29/2020 CLINICAL DATA:  70 year old male with history of pancreatitis. Biliary ductal dilatation. EXAM: MRI ABDOMEN WITHOUT AND WITH CONTRAST (INCLUDING MRCP) TECHNIQUE: Multiplanar multisequence MR imaging of the abdomen was performed both before and after the administration of intravenous contrast. Heavily T2-weighted images of the biliary and pancreatic ducts were obtained, and three-dimensional MRCP images were rendered by post processing. CONTRAST:  81mL GADAVIST GADOBUTROL 1 MMOL/ML IV SOLN COMPARISON:  No prior abdominal MRI. CT the abdomen and pelvis 11/29/2020. FINDINGS: Lower chest: Unremarkable. Hepatobiliary: No suspicious cystic or solid hepatic lesions. There is mild intrahepatic biliary ductal dilatation. Common hepatic duct is dilated measuring up to 15 mm in diameter. Common bile duct is mildly dilated measuring up to 8 mm in diameter. MRCP images are suboptimal but demonstrate a potential filling defect in the distal common bile duct estimated to measure approximately 5 mm (coronal image 56 of series 11). However, this cannot be confirmed on additional T2 weighted sequences. There are no filling defects within the gallbladder to suggest cholelithiasis. Gallbladder is moderately distended. Gallbladder wall thickness appears normal. Pancreas: Pancreatic ductal dilatation is again noted throughout the body and tail of the pancreas, measuring up to 9 mm in the body. This ductal dilatation abruptly terminates in the region of the pancreatic head. In this region, no discrete pancreatic mass is confidently identified on today's examination. There is atrophy in the distal body and tail of the pancreas. Several complex fluid collections emanate from the pancreas and extend into the root of the small bowel mesentery, demonstrating heterogeneous signal  intensity on T1 and T2 weighted images, compatible with pancreatic pseudocysts. The largest of these is complex in shape and therefore difficult to measure,  but estimated measure approximately 6.2 x 3.0 x 5.5 cm (axial image 24 of series 4 and coronal image 10 of series 5). Spleen:  Unremarkable. Adrenals/Urinary Tract: Multiple small T1 hypointense, T2 hyperintense, nonenhancing lesions are noted in both kidneys, compatible with simple cysts, measuring 1.3 cm or less in size. No definite solid renal lesions are noted. No hydroureteronephrosis in the visualized portions of the abdomen. Bilateral adrenal glands are normal in appearance. Stomach/Bowel: Unremarkable. Vascular/Lymphatic: No aneurysm identified in the visualized abdominal vasculature. Splenic vein, superior mesenteric vein, splenoportal confluence and proximal portal vein are all narrowed secondary to mass effect from the acute pancreatitis. Other:  Small volume of ascites most evident adjacent to the liver. Musculoskeletal: No aggressive appearing osseous lesions are noted in the visualized portions of the skeleton. IMPRESSION: 1. MRCP images are suboptimal, but suggest the possibility of choledocholithiasis in the distal common bile duct. Notably, however, this could not be confirmed on additional T2 weighted sequences, and would not explain the persistent pancreatic ductal dilatation seen on today's examination. The possibility of a pancreatic head mass is considered, although no discrete mass is confidently identified on today's examination which is confounded by the presence of acute pancreatitis with multiple complex pseudocysts, as discussed above. Electronically Signed   By: Vinnie Langton M.D.   On: 11/29/2020 20:59   DG Chest Port 1 View  Result Date: 11/29/2020 CLINICAL DATA:  Cough, dyspnea, abdominal pain, pancreatitis EXAM: PORTABLE CHEST 1 VIEW COMPARISON:  11/27/2020 chest radiograph. FINDINGS: Stable cardiomediastinal silhouette with mild cardiomegaly. No pneumothorax. No pleural effusion. Lungs appear clear, with no acute consolidative airspace disease and no pulmonary edema.  IMPRESSION: Mild cardiomegaly without pulmonary edema. No active pulmonary disease. Electronically Signed   By: Ilona Sorrel M.D.   On: 11/29/2020 12:26   MR ABDOMEN MRCP W WO CONTAST  Result Date: 11/29/2020 CLINICAL DATA:  70 year old male with history of pancreatitis. Biliary ductal dilatation. EXAM: MRI ABDOMEN WITHOUT AND WITH CONTRAST (INCLUDING MRCP) TECHNIQUE: Multiplanar multisequence MR imaging of the abdomen was performed both before and after the administration of intravenous contrast. Heavily T2-weighted images of the biliary and pancreatic ducts were obtained, and three-dimensional MRCP images were rendered by post processing. CONTRAST:  18mL GADAVIST GADOBUTROL 1 MMOL/ML IV SOLN COMPARISON:  No prior abdominal MRI. CT the abdomen and pelvis 11/29/2020. FINDINGS: Lower chest: Unremarkable. Hepatobiliary: No suspicious cystic or solid hepatic lesions. There is mild intrahepatic biliary ductal dilatation. Common hepatic duct is dilated measuring up to 15 mm in diameter. Common bile duct is mildly dilated measuring up to 8 mm in diameter. MRCP images are suboptimal but demonstrate a potential filling defect in the distal common bile duct estimated to measure approximately 5 mm (coronal image 56 of series 11). However, this cannot be confirmed on additional T2 weighted sequences. There are no filling defects within the gallbladder to suggest cholelithiasis. Gallbladder is moderately distended. Gallbladder wall thickness appears normal. Pancreas: Pancreatic ductal dilatation is again noted throughout the body and tail of the pancreas, measuring up to 9 mm in the body. This ductal dilatation abruptly terminates in the region of the pancreatic head. In this region, no discrete pancreatic mass is confidently identified on today's examination. There is atrophy in the distal body and tail of the pancreas. Several complex fluid collections emanate from the pancreas and extend into the root of  the small bowel  mesentery, demonstrating heterogeneous signal intensity on T1 and T2 weighted images, compatible with pancreatic pseudocysts. The largest of these is complex in shape and therefore difficult to measure, but estimated measure approximately 6.2 x 3.0 x 5.5 cm (axial image 24 of series 4 and coronal image 10 of series 5). Spleen:  Unremarkable. Adrenals/Urinary Tract: Multiple small T1 hypointense, T2 hyperintense, nonenhancing lesions are noted in both kidneys, compatible with simple cysts, measuring 1.3 cm or less in size. No definite solid renal lesions are noted. No hydroureteronephrosis in the visualized portions of the abdomen. Bilateral adrenal glands are normal in appearance. Stomach/Bowel: Unremarkable. Vascular/Lymphatic: No aneurysm identified in the visualized abdominal vasculature. Splenic vein, superior mesenteric vein, splenoportal confluence and proximal portal vein are all narrowed secondary to mass effect from the acute pancreatitis. Other:  Small volume of ascites most evident adjacent to the liver. Musculoskeletal: No aggressive appearing osseous lesions are noted in the visualized portions of the skeleton. IMPRESSION: 1. MRCP images are suboptimal, but suggest the possibility of choledocholithiasis in the distal common bile duct. Notably, however, this could not be confirmed on additional T2 weighted sequences, and would not explain the persistent pancreatic ductal dilatation seen on today's examination. The possibility of a pancreatic head mass is considered, although no discrete mass is confidently identified on today's examination which is confounded by the presence of acute pancreatitis with multiple complex pseudocysts, as discussed above. Electronically Signed   By: Vinnie Langton M.D.   On: 11/29/2020 20:59   ECHOCARDIOGRAM COMPLETE  Result Date: 11/29/2020    ECHOCARDIOGRAM REPORT   Patient Name:   Ryan [name] Date of Exam: 11/29/2020 Medical Rec #:  [id number]       Height:        71.0 in Accession #:    [id number]      Weight:       188.0 lb Date of Birth:  August 27, 1950       BSA:          2.054 m Patient Age:    51 years        BP:           148/83 mmHg Patient Gender: M               HR:           98 bpm. Exam Location:  [facility] Procedure: 2D Echo, Cardiac Doppler and Color Doppler Indications:    Cardiomyopathy  History:        Patient has prior history of Echocardiogram examinations, most                 recent 10/16/2018. CHF, COPD; Risk Factors:Hypertension and                 Dyslipidemia. Nonischemic cardiomyopathy, Tobacco abuse.  Sonographer:    Wenda Low Referring Phys: [id number] ANKIT CHIRAG AMIN IMPRESSIONS  1. Global hypokinesis. The anterior, anteroseptal walls are akinetic. Marland Kitchen Left ventricular ejection fraction, by estimation, is 15-20%. The left ventricle has severely decreased function. The left ventricle demonstrates global hypokinesis. The left ventricular internal cavity size was severely dilated. Left ventricular diastolic parameters are indeterminate.  2. Right ventricular systolic function is normal. The right ventricular size is normal.  3. Left atrial size was severely dilated.  4. The mitral valve is normal in structure. Trivial mitral valve regurgitation. No evidence of mitral stenosis.  5. The aortic valve is tricuspid. Aortic valve regurgitation is not visualized. No  aortic stenosis is present. FINDINGS  Left Ventricle: Global hypokinesis. The anterior, anteroseptal walls are akinetic. Left ventricular ejection fraction, by estimation, is 15-20%. The left ventricle has severely decreased function. The left ventricle demonstrates global hypokinesis. The left ventricular internal cavity size was severely dilated. There is no left ventricular hypertrophy. Left ventricular diastolic parameters are indeterminate. Right Ventricle: The right ventricular size is normal. No increase in right ventricular wall thickness. Right ventricular systolic function is normal.  Left Atrium: Left atrial size was severely dilated. Right Atrium: Right atrial size was normal in size. Pericardium: There is no evidence of pericardial effusion. Mitral Valve: The mitral valve is normal in structure. Trivial mitral valve regurgitation. No evidence of mitral valve stenosis. MV peak gradient, 3.1 mmHg. The mean mitral valve gradient is 1.0 mmHg. Tricuspid Valve: The tricuspid valve is not well visualized. Tricuspid valve regurgitation is not demonstrated. No evidence of tricuspid stenosis. Aortic Valve: The aortic valve is tricuspid. Aortic valve regurgitation is not visualized. No aortic stenosis is present. Aortic valve mean gradient measures 3.0 mmHg. Aortic valve peak gradient measures 4.8 mmHg. Aortic valve area, by VTI measures 3.28 cm. Pulmonic Valve: The pulmonic valve was not well visualized. Pulmonic valve regurgitation is not visualized. No evidence of pulmonic stenosis. Aorta: The aortic root is normal in size and structure. IAS/Shunts: The interatrial septum was not well visualized.  LEFT VENTRICLE PLAX 2D LVIDd:         7.00 cm      Diastology LVIDs:         6.50 cm      LV e' medial:    4.68 cm/s LV PW:         1.10 cm      LV E/e' medial:  17.8 LV IVS:        0.90 cm      LV e' lateral:   10.40 cm/s LVOT diam:     2.00 cm      LV E/e' lateral: 8.0 LV SV:         58 LV SV Index:   28 LVOT Area:     3.14 cm  LV Volumes (MOD) LV vol d, MOD A2C: 288.0 ml LV vol d, MOD A4C: 275.0 ml LV vol s, MOD A2C: 238.0 ml LV vol s, MOD A4C: 199.0 ml LV SV MOD A2C:     50.0 ml LV SV MOD A4C:     275.0 ml LV SV MOD BP:      62.3 ml RIGHT VENTRICLE RV Basal diam:  3.55 cm RV Mid diam:    3.10 cm RV S prime:     13.50 cm/s TAPSE (M-mode): 1.8 cm LEFT ATRIUM              Index       RIGHT ATRIUM           Index LA diam:        4.30 cm  2.09 cm/m  RA Area:     19.30 cm LA Vol (A2C):   113.0 ml 55.02 ml/m RA Volume:   54.80 ml  26.68 ml/m LA Vol (A4C):   126.0 ml 61.35 ml/m LA Biplane Vol: 124.0 ml  60.37 ml/m  AORTIC VALVE                   PULMONIC VALVE AV Area (Vmax):    3.23 cm    PV Vmax:       0.80 m/s AV Area (Vmean):  2.89 cm    PV Peak grad:  2.6 mmHg AV Area (VTI):     3.28 cm AV Vmax:           109.00 cm/s AV Vmean:          74.400 cm/s AV VTI:            0.176 m AV Peak Grad:      4.8 mmHg AV Mean Grad:      3.0 mmHg LVOT Vmax:         112.00 cm/s LVOT Vmean:        68.400 cm/s LVOT VTI:          0.184 m LVOT/AV VTI ratio: 1.05  AORTA Ao Root diam: 3.60 cm Ao Asc diam:  3.50 cm MITRAL VALVE MV Area (PHT): 5.42 cm    SHUNTS MV Area VTI:   4.62 cm    Systemic VTI:  0.18 m MV Peak grad:  3.1 mmHg    Systemic Diam: 2.00 cm MV Mean grad:  1.0 mmHg MV Vmax:       0.88 m/s MV Vmean:      52.5 cm/s MV Decel Time: 140 msec MV E velocity: 83.10 cm/s MV A velocity: 62.10 cm/s MV E/A ratio:  1.34 Carlyle Dolly MD Electronically signed by Carlyle Dolly MD Signature Date/Time: 11/29/2020/4:25:37 PM    Final   [2 weeks]   Assessment: 70 year old male with past medical history of CHF with EF of 15 to 20%, CKD, polysubstance abuse, GERD, colonic tubular adenoma with localized high-grade dysplasia December 2019, recurrent suspected alcohol induced pancreatitis with multiple admissions/presentations to the ED who presented with abdominal pain.  GI consulted for acute pancreatitis with peripancreatic pseudocyst, mild biliary and pancreatic duct dilation.  Pancreatitis: Acute on chronic due to excessive alcohol use previously, quit alcohol 2 months ago.  LFTs normal except for alkaline phosphatase initially 209, down to 151 yesterday.  AST with minimal elevation yesterday at 44.  Lipase minimally elevated.CT suggested acute pancreatitis with multiple peripancreatic pseudocyst (2 of these measures over 5 cm) likely from prior pancreatitis.  Noted to have biliary and pancreatic ductal dilatation and pancreatic atrophy.  Discussed with Dr. Laural Golden, suspected to have pancreatic ductal stone as well.   Triglycerides 93. Slow to progress, complains of ongoing pp abdominal pain. Diet has been downgraded. Discussed with patient, goal is to manage pain and make sure he can obtain appropriate nutrition prior to discharge.   MRI/MRCP with mild intrahepatic biliary ductal dilation, common hepatic duct dilated up to 15 mm, CBD dilated up to 8 mm.  Potential filling defect noted in the distal common bile duct approximately 5 mm, unable to confirm.  No gallbladder defects to suggest cholelithiasis.  No pancreatic mass definitively identified.  Several complex fluid collections in the pancreas.  Pancreatic biliary duct dilation have been present since at least November 2021 as well as concern for possible pancreatic head mass on prior studies.  Dysphagia: Chronic, intermittent pill dysphagia with large pills.  Chronically on PPI.  No significant GERD.  Consider outpatient EGD.  Anemia: Hemoglobin 12 on admission, down to 10.5 the following day.  Hemoglobin reportedly 7.1 on September 21 but hemoglobin repeated later that day and was 10.  No transfusion.  Hemoglobin 9.7 today, essentially stable.  Iron studies suggest IDA.  Serum ferritin normal but likely falsely elevated due to acute phase reactant.  B12 and folate normal.  No overt GI bleeding.  No NSAIDs.  Last colonoscopy 2019,  due later this year.  Last EGD November 2019 with congested duodenal mucosa.  Plan: Plans for outpatient follow-up with Dr. Rush Landmark as scheduled.  Determination will be made regarding EUS plus or minus ERCP. Increase Creon to 72,000 units with meals, 36,000 units with snacks. Continue PPI daily. Complete colonoscopy later this year with primary GI.  Laureen Ochs. Bernarda Caffey [facility] [phone] 9/23/202212:28 PM    LOS: 4 days

## 2020-12-03 NOTE — Progress Notes (Signed)
PROGRESS NOTE    Ryan [name]  GQQ:[id number] DOB: 1950-11-17 DOA: 11/29/2020 PCP: [facility]   Brief Narrative:  70 year old with history of tobacco use, alcohol use, CKD stage II, systolic CHF EF 67%, recurrent pancreatitis, GERD, polysubstance abuse admitted for abdominal pain.  He also reported of shortness of breath and cough.  He was diagnosed with acute pancreatitis and COPD exacerbation and admitted to the hospital.  GI team was consulted.  MRCP performed showed possible pseudocyst.  Conservative management was planned with outpatient EUS.  Repeat echocardiogram in the hospital showed EF of 15-20%.  GI team is following and now conservatively managing his diet.   Assessment & Plan:   Principal Problem:   Acute pancreatitis Active Problems:   Essential hypertension   Tobacco use disorder   CKD (chronic kidney disease), stage II   COPD (chronic obstructive pulmonary disease) (HCC)   Chronic HFrEF (heart failure with reduced ejection fraction) (HCC)   Pain   Pancreatic pseudocyst   Dilation of biliary tract   Dilation of pancreatic duct   Anemia   Acute recurrent alcoholic pancreatitis - CT shows possible concerns of complicated pancreatitis.  Advance his diet to soft.  Gentle hydration with caution. MRCP showed pseudocyst but no active necrosis.  Patient still having abdominal pain, will change his diet to full liquid from soft.  GI following.   Acute respiratory distress Acute bronchitis with community-acquired pneumonia Acute COPD exacerbation -Likely from COPD exacerbation which is improved.  Procalcitonin 0.1. -Empiric IV Rocephin and azithromycin.  Prednisone day 5 of 5 - Supplemental oxygen.  I-S/flutter - Scheduled and as needed bronchodilators   Acute congestive heart failure with reduced ejection fraction, EF 15%, class II -Repeat echocardiogram shows EF of 15%.  Cardiology team to arrange for outpatient follow-up.  Clinically appears dehydrated,  hold Lasix  - Suspect due to abdominal pain and dehydration, will give gentle small bolus 500cc  Asymptomatic bacteriuria - He is already on Rocephin.  Urine cultures grew multiple species   EtOH Abuse -Alcohol withdrawal protocol.  Thiamine, folic acid and multivitamin   GERD -PPI   Constipation - Bowel regimen   DVT prophylaxis: enoxaparin (LOVENOX) injection 40 mg Start: 12/01/20 1600 Code Status: Lovenox Family Communication:    Status is: Inpatient  Remains inpatient appropriate because:Inpatient level of care appropriate due to severity of illness.  Still having difficulty tolerating overall and has abdominal pain.  Maintain hospital stay  Dispo: The patient is from: Home              Anticipated d/c is to: Home              Patient currently is not medically stable to d/c.   Difficult to place patient No       Subjective: Patient is still having epigastric abdominal pain with soft diet this morning.  Breathing is better.  Had 1 soft bowel movement yesterday  Review of Systems Otherwise negative except as per HPI, including: General: Denies fever, chills, night sweats or unintended weight loss. Resp: Denies cough, wheezing, shortness of breath. Cardiac: Denies chest pain, palpitations, orthopnea, paroxysmal nocturnal dyspnea. GI: Denies nausea, vomiting, diarrhea or constipation GU: Denies dysuria, frequency, hesitancy or incontinence MS: Denies muscle aches, joint pain or swelling Neuro: Denies headache, neurologic deficits (focal weakness, numbness, tingling), abnormal gait Psych: Denies anxiety, depression, SI/HI/AVH Skin: Denies new rashes or lesions ID: Denies sick contacts, exotic exposures, travel  Examination: Constitutional: Not in acute distress Respiratory: Very  mild bilateral rhonchi Cardiovascular: Normal sinus rhythm, no rubs Abdomen: Nontender nondistended good bowel sounds Musculoskeletal: No edema noted Skin: No rashes seen Neurologic:  CN 2-12 grossly intact.  And nonfocal Psychiatric: Normal judgment and insight. Alert and oriented x 3. Normal mood. Vitals:   12/03/20 0758 12/03/20 0802 12/03/20 0834 12/03/20 0944  BP:   126/85 129/87  Pulse:   (!) 109 (!) 111  Resp:   (!) 22 20  Temp:   97.8 F (36.6 C) 97.8 F (36.6 C)  TempSrc:   Oral Oral  SpO2: 98% 100% 100% 100%  Weight:      Height:        Intake/Output Summary (Last 24 hours) at 12/03/2020 1031 Last data filed at 12/03/2020 0800 Gross per 24 hour  Intake 720 ml  Output 950 ml  Net -230 ml   Filed Weights   12/01/20 0517 12/02/20 0642 12/03/20 0500  Weight: 82.5 kg 73.8 kg 78.5 kg     Data Reviewed:   CBC: Recent Labs  Lab 11/27/20 0944 11/29/20 0921 11/30/20 0503 12/01/20 0456 12/01/20 0722 12/02/20 0518 12/03/20 0514  WBC 11.0* 12.0* 8.6 10.2  --  10.7* 10.4  NEUTROABS 8.9* 10.4*  --   --   --   --   --   HGB 11.8* 12.0* 10.5* 7.1* 10.1* 9.9* 9.7*  HCT 36.4* 36.8* 32.7* 22.4* 30.9* 30.6* 29.6*  MCV 94.5 92.9 93.2 98.2  --  94.4 93.4  PLT 366 306 274 240  --  319 161   Basic Metabolic Panel: Recent Labs  Lab 11/27/20 0944 11/29/20 0921 11/30/20 0503 12/01/20 0456 12/02/20 0518 12/03/20 0514  NA 136 135 134* 136 137  --   K 3.8 3.7 4.1 3.9 3.7  --   CL 102 98 99 102 102  --   CO2 23 26 28 25 27   --   GLUCOSE 86 96 115* 126* 110*  --   BUN 11 10 9 9 11   --   CREATININE 1.08 0.95 0.83 0.76 0.82  --   CALCIUM 8.3* 8.3* 7.9* 8.3* 8.3*  --   MG  --   --  2.0 2.2 2.3 2.1   GFR: Estimated Creatinine Clearance: 88 mL/min (by C-G formula based on SCr of 0.82 mg/dL). Liver Function Tests: Recent Labs  Lab 11/27/20 0944 11/29/20 0921 11/30/20 0503 12/01/20 0456 12/02/20 0518  AST 17 15 12* 29 44*  ALT 12 12 10 15  33  ALKPHOS 105 209* 161* 150* 151*  BILITOT 0.5 0.9 0.7 0.1* 0.1*  PROT 6.9 7.2 6.4* 6.4* 6.5  ALBUMIN 3.0* 2.9* 2.4* 2.5* 2.6*   Recent Labs  Lab 11/27/20 0944 11/29/20 0921 12/02/20 0518  LIPASE 128*  87* 131*   No results for input(s): AMMONIA in the last 168 hours. Coagulation Profile: No results for input(s): INR, PROTIME in the last 168 hours. Cardiac Enzymes: No results for input(s): CKTOTAL, CKMB, CKMBINDEX, TROPONINI in the last 168 hours. BNP (last 3 results) No results for input(s): PROBNP in the last 8760 hours. HbA1C: No results for input(s): HGBA1C in the last 72 hours. CBG: No results for input(s): GLUCAP in the last 168 hours. Lipid Profile: No results for input(s): CHOL, HDL, LDLCALC, TRIG, CHOLHDL, LDLDIRECT in the last 72 hours.  Thyroid Function Tests: No results for input(s): TSH, T4TOTAL, FREET4, T3FREE, THYROIDAB in the last 72 hours. Anemia Panel: No results for input(s): VITAMINB12, FOLATE, FERRITIN, TIBC, IRON, RETICCTPCT in the last 72 hours.  Sepsis Labs:  Recent Labs  Lab 11/29/20 0921  PROCALCITON 0.10    Recent Results (from the past 240 hour(s))  Resp Panel by RT-PCR (Flu A&B, Covid) Nasopharyngeal Swab     Status: None   Collection Time: 11/27/20 10:27 AM   Specimen: Nasopharyngeal Swab; Nasopharyngeal(NP) swabs in vial transport medium  Result Value Ref Range Status   SARS Coronavirus 2 by RT PCR NEGATIVE NEGATIVE Final    Comment: (NOTE) SARS-CoV-2 target nucleic acids are NOT DETECTED.  The SARS-CoV-2 RNA is generally detectable in upper respiratory specimens during the acute phase of infection. The lowest concentration of SARS-CoV-2 viral copies this assay can detect is 138 copies/mL. A negative result does not preclude SARS-Cov-2 infection and should not be used as the sole basis for treatment or other patient management decisions. A negative result may occur with  improper specimen collection/handling, submission of specimen other than nasopharyngeal swab, presence of viral mutation(s) within the areas targeted by this assay, and inadequate number of viral copies(<138 copies/mL). A negative result must be combined with clinical  observations, patient history, and epidemiological information. The expected result is Negative.  Fact Sheet for Patients:  EntrepreneurPulse.com.au  Fact Sheet for Healthcare Providers:  IncredibleEmployment.be  This test is no t yet approved or cleared by the [place] FDA and  has been authorized for detection and/or diagnosis of SARS-CoV-2 by FDA under an Emergency Use Authorization (EUA). This EUA will remain  in effect (meaning this test can be used) for the duration of the COVID-19 declaration under Section 564(b)(1) of the Act, 21 U.S.C.section 360bbb-3(b)(1), unless the authorization is terminated  or revoked sooner.       Influenza A by PCR NEGATIVE NEGATIVE Final   Influenza B by PCR NEGATIVE NEGATIVE Final    Comment: (NOTE) The Xpert Xpress SARS-CoV-2/FLU/RSV plus assay is intended as an aid in the diagnosis of influenza from Nasopharyngeal swab specimens and should not be used as a sole basis for treatment. Nasal washings and aspirates are unacceptable for Xpert Xpress SARS-CoV-2/FLU/RSV testing.  Fact Sheet for Patients: EntrepreneurPulse.com.au  Fact Sheet for Healthcare Providers: IncredibleEmployment.be  This test is not yet approved or cleared by the [place] FDA and has been authorized for detection and/or diagnosis of SARS-CoV-2 by FDA under an Emergency Use Authorization (EUA). This EUA will remain in effect (meaning this test can be used) for the duration of the COVID-19 declaration under Section 564(b)(1) of the Act, 21 U.S.C. section 360bbb-3(b)(1), unless the authorization is terminated or revoked.  Performed at [facility], [address], [place], [place] [address]   Urine Culture     Status: Abnormal   Collection Time: 11/29/20 12:00 PM   Specimen: Urine, Clean Catch  Result Value Ref Range Status   Specimen Description   Final    URINE, CLEAN CATCH Performed  at [facility], [address] [place], [place] [address]    Special Requests   Final    NONE Performed at [facility], [address] [place], [place] [address]    Culture MULTIPLE SPECIES PRESENT, SUGGEST RECOLLECTION (A)  Final   Report Status 12/01/2020 FINAL  Final  Resp Panel by RT-PCR (Flu A&B, Covid)     Status: None   Collection Time: 11/29/20 12:08 PM   Specimen: Nasopharyngeal(NP) swabs in vial transport medium  Result Value Ref Range Status   SARS Coronavirus 2 by RT PCR NEGATIVE NEGATIVE Final    Comment: (NOTE) SARS-CoV-2 target nucleic acids are NOT DETECTED.  The SARS-CoV-2  RNA is generally detectable in upper respiratory specimens during the acute phase of infection. The lowest concentration of SARS-CoV-2 viral copies this assay can detect is 138 copies/mL. A negative result does not preclude SARS-Cov-2 infection and should not be used as the sole basis for treatment or other patient management decisions. A negative result may occur with  improper specimen collection/handling, submission of specimen other than nasopharyngeal swab, presence of viral mutation(s) within the areas targeted by this assay, and inadequate number of viral copies(<138 copies/mL). A negative result must be combined with clinical observations, patient history, and epidemiological information. The expected result is Negative.  Fact Sheet for Patients:  EntrepreneurPulse.com.au  Fact Sheet for Healthcare Providers:  IncredibleEmployment.be  This test is no t yet approved or cleared by the [place] FDA and  has been authorized for detection and/or diagnosis of SARS-CoV-2 by FDA under an Emergency Use Authorization (EUA). This EUA will remain  in effect (meaning this test can be used) for the duration of the COVID-19 declaration under Section 564(b)(1) of the Act, 21 U.S.C.section 360bbb-3(b)(1), unless the authorization is terminated  or revoked  sooner.       Influenza A by PCR NEGATIVE NEGATIVE Final   Influenza B by PCR NEGATIVE NEGATIVE Final    Comment: (NOTE) The Xpert Xpress SARS-CoV-2/FLU/RSV plus assay is intended as an aid in the diagnosis of influenza from Nasopharyngeal swab specimens and should not be used as a sole basis for treatment. Nasal washings and aspirates are unacceptable for Xpert Xpress SARS-CoV-2/FLU/RSV testing.  Fact Sheet for Patients: EntrepreneurPulse.com.au  Fact Sheet for Healthcare Providers: IncredibleEmployment.be  This test is not yet approved or cleared by the [place] FDA and has been authorized for detection and/or diagnosis of SARS-CoV-2 by FDA under an Emergency Use Authorization (EUA). This EUA will remain in effect (meaning this test can be used) for the duration of the COVID-19 declaration under Section 564(b)(1) of the Act, 21 U.S.C. section 360bbb-3(b)(1), unless the authorization is terminated or revoked.  Performed at [facility], [address] [place], [place] [address]          Radiology Studies: DG Abd 1 View  Result Date: 12/01/2020 CLINICAL DATA:  Abdominal pain and history of pancreatitis EXAM: ABDOMEN - 1 VIEW COMPARISON:  CT from 11/29/2020 FINDINGS: Scattered large and small bowel gas is noted. Multiple edematous loops of small bowel are noted without significant gas within. No true obstructive changes are seen. No free air is noted. Nonobstructing right renal stone is noted stable in appearance from the prior exam. Chronic changes about the proximal left hip are seen. Degenerative changes of lumbar spine are noted. IMPRESSION: Edematous loops of small bowel likely related to the underlying pancreatitis. No true obstructive changes are seen at this time. Stable right renal stone. Electronically Signed   By: Inez Catalina M.D.   On: 12/01/2020 15:08        Scheduled Meds:  budesonide (PULMICORT) nebulizer solution   0.5 mg Nebulization BID   enoxaparin (LOVENOX) injection  40 mg Subcutaneous Q24H   ferrous sulfate  325 mg Oral Q breakfast   folic acid  1 mg Oral Daily   furosemide  20 mg Oral Daily   gabapentin  300 mg Oral BID   ipratropium-albuterol  3 mL Nebulization BID   lipase/protease/amylase  36,000 Units Oral TID AC   multivitamin with minerals  1 tablet Oral Daily   pantoprazole  40 mg Oral Daily   senna-docusate  1 tablet  Oral QHS   thiamine  100 mg Oral Daily   Or   thiamine  100 mg Intravenous Daily   Continuous Infusions:  cefTRIAXone (ROCEPHIN)  IV 1 g (12/02/20 1539)   sodium chloride       LOS: 4 days   Time spent= 35 mins    Gabryela Kimbrell Arsenio Loader, MD [facility]  If 7PM-7AM, please contact night-coverage  12/03/2020, 10:31 AM

## 2020-12-03 NOTE — Telephone Encounter (Signed)
The pt has an appt on 10/20 with GM.  CT ordered and sent to the schedulers to setup.  The pt has been advised and will call back if he has not heard from that office in 1 week.

## 2020-12-03 NOTE — TOC Initial Note (Addendum)
Transition of Care [facility]) - Initial/Assessment Note    Patient Details  Name: Ryan [name] MRN: [id number] Date of Birth: [birth year]  Transition of Care [facility] CM/SW Contact:    Boneta Lucks, RN Phone Number: 12/03/2020, 1:05 PM  Clinical Narrative:    Patient admitted with acute pancreatitis. Patient has a high risk of readmission. Patient lives with his son, he provides transportation as need. Patient goes to the [facility]. He walks with a cane. TOC consulted for Substance abuse. Patient states he has not drank in 2 months. He has attended [place] meetings in the past, but does not need to go now, he is not drinking. TOC offered home health. Patient states he walks with a cane and does well. He just needs to feel better.                Expected Discharge Plan: Home/Self Care Barriers to Discharge: Continued Medical Work up  Patient Goals and CMS Choice Patient states their goals for this hospitalization and ongoing recovery are:: to go home. CMS Medicare.gov Compare Post Acute Care list provided to:: Patient   Expected Discharge Plan and Services Expected Discharge Plan: Home/Self Care      Living arrangements for the past 2 months: Single Family Home Expected Discharge Date: 12/01/20                   Prior Living Arrangements/Services Living arrangements for the past 2 months: Single Family Home Lives with:: Adult Children Patient language and need for interpreter reviewed:: Yes Do you feel safe going back to the place where you live?: Yes      Need for Family Participation in Patient Care: Yes (Comment) Care giver support system in place?: Yes (comment)   Criminal Activity/Legal Involvement Pertinent to Current Situation/Hospitalization: No - Comment as needed  Activities of Daily Living Home Assistive Devices/Equipment: Crutches ADL Screening (condition at time of admission) Patient's cognitive ability adequate to safely complete daily activities?: Yes Is the patient  deaf or have difficulty hearing?: No Does the patient have difficulty seeing, even when wearing glasses/contacts?: No Does the patient have difficulty concentrating, remembering, or making decisions?: No Patient able to express need for assistance with ADLs?: Yes Does the patient have difficulty dressing or bathing?: No Independently performs ADLs?: Yes (appropriate for developmental age) Does the patient have difficulty walking or climbing stairs?: No Weakness of Legs: None Weakness of Arms/Hands: None  Permission Sought/Granted     Emotional Assessment    Affect (typically observed): Accepting Orientation: : Oriented to Self, Oriented to Place, Oriented to  Time, Oriented to Situation Alcohol / Substance Use: Alcohol Use Psych Involvement: No (comment)  Admission diagnosis:  Acute pancreatitis [K85.90] Cough [R05.9] Pancreatic pseudocyst [K86.3] Acute on chronic pancreatitis ([place]) [K85.90, K86.1] Patient Active Problem List   Diagnosis Date Noted   Dilation of biliary tract    Dilation of pancreatic duct    Anemia    Pain    Pancreatic pseudocyst    Alcohol abuse    Pancreatitis 10/25/2020   Acute pancreatitis 10/24/2020   Chronic HFrEF (heart failure with reduced ejection fraction) (HCC)    Adenomatous polyp of descending colon    Nonischemic cardiomyopathy (HCC)    Chronic combined systolic and diastolic CHF (congestive heart failure) ([place]) 07/13/2016   Alcohol use disorder    Hypokalemia 11/27/2015   COPD (chronic obstructive pulmonary disease) ([place]) 12/19/2013   CKD (chronic kidney disease), stage II 12/03/2013   Post-traumatic osteoarthritis of left hip  11/26/2013   Post-traumatic osteoarthritis of left knee 11/26/2013   Hyperlipidemia 11/26/2013   Glaucoma 04/23/2013   Low back pain 08/29/2010   Tobacco use disorder 07/08/2010   Essential hypertension 05/16/2010   PCP:  [facility] [place]:   [id number] [place], [place] [address] [place] [address] Phone: [phone] Fax: [phone]   Readmission Risk Interventions Readmission Risk Prevention Plan 12/03/2020  Transportation Screening Complete  HRI or Home Care Consult Complete  Social Work Consult for [place] Planning/Counseling Complete  Palliative Care Screening Not Applicable  Medication Review Press photographer) Complete  Some recent data might be hidden

## 2020-12-03 NOTE — Progress Notes (Signed)
   12/03/20 1607  Vitals  Temp 98.4 F (36.9 C)  Temp Source Oral  BP 129/82  BP Location Left Arm  BP Method Automatic  Patient Position (if appropriate) Lying  Pulse Rate (!) 112 (Notified LPN)  Pulse Rate Source Monitor  Resp 20  MEWS COLOR  MEWS Score Color Yellow  Oxygen Therapy  SpO2 100 %  O2 Device Room Air  MEWS Score  MEWS Temp 0  MEWS Systolic 0  MEWS Pulse 2  MEWS RR 0  MEWS LOC 0  MEWS Score 2  Provider Notification  Provider Name/Title Damita Lack, MD  Date Provider Notified 12/03/20  Time Provider Notified 1630  Notification Type Page  Notification Reason Other (Comment) (Yellow MEWD)  Provider response See new orders (Give home dose of Coreg)

## 2020-12-04 DIAGNOSIS — K859 Acute pancreatitis without necrosis or infection, unspecified: Secondary | ICD-10-CM | POA: Diagnosis not present

## 2020-12-04 DIAGNOSIS — K5900 Constipation, unspecified: Secondary | ICD-10-CM

## 2020-12-04 DIAGNOSIS — K861 Other chronic pancreatitis: Secondary | ICD-10-CM

## 2020-12-04 DIAGNOSIS — N182 Chronic kidney disease, stage 2 (mild): Secondary | ICD-10-CM | POA: Diagnosis not present

## 2020-12-04 DIAGNOSIS — I5022 Chronic systolic (congestive) heart failure: Secondary | ICD-10-CM | POA: Diagnosis not present

## 2020-12-04 LAB — COMPREHENSIVE METABOLIC PANEL
ALT: 50 U/L — ABNORMAL HIGH (ref 0–44)
AST: 48 U/L — ABNORMAL HIGH (ref 15–41)
Albumin: 2.5 g/dL — ABNORMAL LOW (ref 3.5–5.0)
Alkaline Phosphatase: 180 U/L — ABNORMAL HIGH (ref 38–126)
Anion gap: 8 (ref 5–15)
BUN: 17 mg/dL (ref 8–23)
CO2: 24 mmol/L (ref 22–32)
Calcium: 8.5 mg/dL — ABNORMAL LOW (ref 8.9–10.3)
Chloride: 106 mmol/L (ref 98–111)
Creatinine, Ser: 0.81 mg/dL (ref 0.61–1.24)
GFR, Estimated: >60 mL/min (ref >60)
Glucose, Bld: 123 mg/dL — ABNORMAL HIGH (ref 70–99)
Potassium: 4 mmol/L (ref 3.5–5.1)
Sodium: 138 mmol/L (ref 135–145)
Total Bilirubin: 0.1 mg/dL — ABNORMAL LOW (ref 0.3–1.2)
Total Protein: 6 g/dL — ABNORMAL LOW (ref 6.5–8.1)

## 2020-12-04 LAB — LIPASE, BLOOD: Lipase: 94 U/L — ABNORMAL HIGH (ref 11–51)

## 2020-12-04 LAB — MAGNESIUM: Magnesium: 2.2 mg/dL (ref 1.7–2.4)

## 2020-12-04 MED ORDER — BISACODYL 10 MG RE SUPP
10.0000 mg | Freq: Once | RECTAL | Status: AC
  Administered 2020-12-04: 10 mg via RECTAL
  Filled 2020-12-04: qty 1

## 2020-12-04 MED ORDER — POLYETHYLENE GLYCOL 3350 17 G PO PACK
17.0000 g | PACK | Freq: Every day | ORAL | Status: DC
  Administered 2020-12-04 – 2020-12-07 (×4): 17 g via ORAL
  Filled 2020-12-04 (×4): qty 1

## 2020-12-04 NOTE — Progress Notes (Addendum)
PROGRESS NOTE  Ryan [name] JGO:[id number] DOB: Nov 08, 1950 DOA: 11/29/2020 PCP: [facility]  Brief History:  70 year old with history of tobacco use, alcohol use, CKD stage II, systolic CHF EF 55%, recurrent pancreatitis, GERD, polysubstance abuse admitted for abdominal pain.  He also reported of shortness of breath and cough.  He was diagnosed with acute pancreatitis and COPD exacerbation and admitted to the hospital.  GI team was consulted.  MRCP performed showed  acute pancreatitis with multiple complex Pseudocysts and possible pancreatic duct stone - Conservative management was planned with outpatient EUS.  Repeat echocardiogram in the hospital showed EF of 15-20%.  GI team is following and now conservatively managing his diet.  Assessment/Plan:   Acute on chronic alcoholic pancreatitis - CT shows possible concerns of complicated pancreatitis.  Advance his diet to soft.  Gentle hydration with caution. MRCP showed pseudocyst but no active necrosis.  Patient still having abdominal pain, will change his diet to full liquid from soft.  GI following. -continue Creon with meals -triglycerides 93 -advance to soft diet -appreciate GI follow up   Acute respiratory distress Acute bronchitis with community-acquired pneumonia Acute COPD exacerbation -from COPD exacerbation which is improved.  Procalcitonin 0.10. -Empiric IV Rocephin and azithromycin.  Prednisone day 5 of 5 - Supplemental oxygen.  I-S/flutter - Scheduled and as needed bronchodilators - continue pulmicort -finished 6 days ceftriaxone, 5 days azithro -now stable on RA   Chronic congestive heart failure with reduced ejection fraction, EF 15%, class II -Repeat echocardiogram shows EF of 15%.  Cardiology team to arrange for outpatient follow-up.  Clinically appears dehydrated, hold Lasix -minimized fluid due to cardiomyopathy -11/29/20 Echo--EF 15-20%, global HK, trivial MR -continue coreg    Asymptomatic bacteriuria - He is already on Rocephin.  Urine cultures grew multiple species   EtOH Abuse -Alcohol withdrawal protocol.  Thiamine, folic acid and multivitamin -no signs of withdrawal   GERD -PPI   Constipation - continue miralax -add bisacodyl        Status is: Inpatient  Remains inpatient appropriate because:Inpatient level of care appropriate due to severity of illness  Dispo: The patient is from: Home              Anticipated d/c is to: Home              Patient currently is not medically stable to d/c.   Difficult to place patient No        Family Communication:   Family at bedside  Consultants:    Code Status:  FULL / DNR  DVT Prophylaxis:  [place] Lovenox   Procedures: As Listed in Progress Note Above  Antibiotics: Ceftriaxone 9/19>>9/24 Azithro 9/19>>9/23       Subjective: Patient states abd pain slowly improving.  Denies f/c, cp, sob, n/v/d,  continues to have dry cough.  Complains of constipation  Objective: Vitals:   12/04/20 0500 12/04/20 0501 12/04/20 0755 12/04/20 0810  BP:  (!) 128/95    Pulse:  94    Resp:  15    Temp:  98 F (36.7 C)    TempSrc:      SpO2:  100% 97% 97%  Weight: 84.6 kg     Height:        Intake/Output Summary (Last 24 hours) at 12/04/2020 1530 Last data filed at 12/04/2020 0900 Gross per 24 hour  Intake 1040 ml  Output --  Net 1040 ml   Weight  change: 6.096 kg Exam:  General:  Pt is alert, follows commands appropriately, not in acute distress HEENT: No icterus, No thrush, No neck mass, [place]/AT Cardiovascular: RRR, S1/S2, no rubs, no gallops Respiratory: CTA bilaterally, no wheezing, no crackles, no rhonchi Abdomen: Soft/+BS, epigastric tender, non distended, no guarding Extremities: No edema, No lymphangitis, No petechiae, No rashes, no synovitis   Data Reviewed: I have personally reviewed following labs and imaging studies Basic Metabolic Panel: Recent Labs  Lab 11/29/20 0921  11/30/20 0503 12/01/20 0456 12/02/20 0518 12/03/20 0514 12/04/20 0455  NA 135 134* 136 137  --  138  K 3.7 4.1 3.9 3.7  --  4.0  CL 98 99 102 102  --  106  CO2 26 28 25 27   --  24  GLUCOSE 96 115* 126* 110*  --  123*  BUN 10 9 9 11   --  17  CREATININE 0.95 0.83 0.76 0.82  --  0.81  CALCIUM 8.3* 7.9* 8.3* 8.3*  --  8.5*  MG  --  2.0 2.2 2.3 2.1 2.2   Liver Function Tests: Recent Labs  Lab 11/29/20 0921 11/30/20 0503 12/01/20 0456 12/02/20 0518 12/04/20 0455  AST 15 12* 29 44* 48*  ALT 12 10 15  33 50*  ALKPHOS 209* 161* 150* 151* 180*  BILITOT 0.9 0.7 0.1* 0.1* 0.1*  PROT 7.2 6.4* 6.4* 6.5 6.0*  ALBUMIN 2.9* 2.4* 2.5* 2.6* 2.5*   Recent Labs  Lab 11/29/20 0921 12/02/20 0518 12/04/20 0455  LIPASE 87* 131* 94*   No results for input(s): AMMONIA in the last 168 hours. Coagulation Profile: No results for input(s): INR, PROTIME in the last 168 hours. CBC: Recent Labs  Lab 11/29/20 0921 11/30/20 0503 12/01/20 0456 12/01/20 0722 12/02/20 0518 12/03/20 0514  WBC 12.0* 8.6 10.2  --  10.7* 10.4  NEUTROABS 10.4*  --   --   --   --   --   HGB 12.0* 10.5* 7.1* 10.1* 9.9* 9.7*  HCT 36.8* 32.7* 22.4* 30.9* 30.6* 29.6*  MCV 92.9 93.2 98.2  --  94.4 93.4  PLT 306 274 240  --  319 313   Cardiac Enzymes: No results for input(s): CKTOTAL, CKMB, CKMBINDEX, TROPONINI in the last 168 hours. BNP: Invalid input(s): POCBNP CBG: No results for input(s): GLUCAP in the last 168 hours. HbA1C: No results for input(s): HGBA1C in the last 72 hours. Urine analysis:    Component Value Date/Time   COLORURINE YELLOW 11/29/2020 1200   APPEARANCEUR CLEAR 11/29/2020 1200   LABSPEC >1.046 (H) 11/29/2020 1200   PHURINE 6.0 11/29/2020 1200   GLUCOSEU NEGATIVE 11/29/2020 1200   HGBUR SMALL (A) 11/29/2020 1200   BILIRUBINUR NEGATIVE 11/29/2020 1200   KETONESUR 20 (A) 11/29/2020 1200   PROTEINUR 30 (A) 11/29/2020 1200   UROBILINOGEN 0.2 04/23/2013 2034   NITRITE POSITIVE (A) 11/29/2020  1200   LEUKOCYTESUR MODERATE (A) 11/29/2020 1200   Sepsis Labs: @LABRCNTIP (procalcitonin:4,lacticidven:4) ) Recent Results (from the past 240 hour(s))  Resp Panel by RT-PCR (Flu A&B, Covid) Nasopharyngeal Swab     Status: None   Collection Time: 11/27/20 10:27 AM   Specimen: Nasopharyngeal Swab; Nasopharyngeal(NP) swabs in vial transport medium  Result Value Ref Range Status   SARS Coronavirus 2 by RT PCR NEGATIVE NEGATIVE Final    Comment: (NOTE) SARS-CoV-2 target nucleic acids are NOT DETECTED.  The SARS-CoV-2 RNA is generally detectable in upper respiratory specimens during the acute phase of infection. The lowest concentration of SARS-CoV-2 viral copies this assay  can detect is 138 copies/mL. A negative result does not preclude SARS-Cov-2 infection and should not be used as the sole basis for treatment or other patient management decisions. A negative result may occur with  improper specimen collection/handling, submission of specimen other than nasopharyngeal swab, presence of viral mutation(s) within the areas targeted by this assay, and inadequate number of viral copies(<138 copies/mL). A negative result must be combined with clinical observations, patient history, and epidemiological information. The expected result is Negative.  Fact Sheet for Patients:  EntrepreneurPulse.com.au  Fact Sheet for Healthcare Providers:  IncredibleEmployment.be  This test is no t yet approved or cleared by the [place] FDA and  has been authorized for detection and/or diagnosis of SARS-CoV-2 by FDA under an Emergency Use Authorization (EUA). This EUA will remain  in effect (meaning this test can be used) for the duration of the COVID-19 declaration under Section 564(b)(1) of the Act, 21 U.S.C.section 360bbb-3(b)(1), unless the authorization is terminated  or revoked sooner.       Influenza A by PCR NEGATIVE NEGATIVE Final   Influenza B by PCR  NEGATIVE NEGATIVE Final    Comment: (NOTE) The Xpert Xpress SARS-CoV-2/FLU/RSV plus assay is intended as an aid in the diagnosis of influenza from Nasopharyngeal swab specimens and should not be used as a sole basis for treatment. Nasal washings and aspirates are unacceptable for Xpert Xpress SARS-CoV-2/FLU/RSV testing.  Fact Sheet for Patients: EntrepreneurPulse.com.au  Fact Sheet for Healthcare Providers: IncredibleEmployment.be  This test is not yet approved or cleared by the [place] FDA and has been authorized for detection and/or diagnosis of SARS-CoV-2 by FDA under an Emergency Use Authorization (EUA). This EUA will remain in effect (meaning this test can be used) for the duration of the COVID-19 declaration under Section 564(b)(1) of the Act, 21 U.S.C. section 360bbb-3(b)(1), unless the authorization is terminated or revoked.  Performed at [facility], [address], [place], [place] [address]   Urine Culture     Status: Abnormal   Collection Time: 11/29/20 12:00 PM   Specimen: Urine, Clean Catch  Result Value Ref Range Status   Specimen Description   Final    URINE, CLEAN CATCH Performed at [facility], [address] [place], [place] [address]    Special Requests   Final    NONE Performed at [facility], [address], [place],  [address]    Culture MULTIPLE SPECIES PRESENT, SUGGEST RECOLLECTION (A)  Final   Report Status 12/01/2020 FINAL  Final  Resp Panel by RT-PCR (Flu A&B, Covid)     Status: None   Collection Time: 11/29/20 12:08 PM   Specimen: Nasopharyngeal(NP) swabs in vial transport medium  Result Value Ref Range Status   SARS Coronavirus 2 by RT PCR NEGATIVE NEGATIVE Final    Comment: (NOTE) SARS-CoV-2 target nucleic acids are NOT DETECTED.  The SARS-CoV-2 RNA is generally detectable in upper respiratory specimens during the acute phase of infection. The lowest concentration of SARS-CoV-2 viral copies  this assay can detect is 138 copies/mL. A negative result does not preclude SARS-Cov-2 infection and should not be used as the sole basis for treatment or other patient management decisions. A negative result may occur with  improper specimen collection/handling, submission of specimen other than nasopharyngeal swab, presence of viral mutation(s) within the areas targeted by this assay, and inadequate number of viral copies(<138 copies/mL). A negative result must be combined with clinical observations, patient history, and epidemiological information. The expected result is Negative.  Fact  Sheet for Patients:  EntrepreneurPulse.com.au  Fact Sheet for Healthcare Providers:  IncredibleEmployment.be  This test is no t yet approved or cleared by the [place] FDA and  has been authorized for detection and/or diagnosis of SARS-CoV-2 by FDA under an Emergency Use Authorization (EUA). This EUA will remain  in effect (meaning this test can be used) for the duration of the COVID-19 declaration under Section 564(b)(1) of the Act, 21 U.S.C.section 360bbb-3(b)(1), unless the authorization is terminated  or revoked sooner.       Influenza A by PCR NEGATIVE NEGATIVE Final   Influenza B by PCR NEGATIVE NEGATIVE Final    Comment: (NOTE) The Xpert Xpress SARS-CoV-2/FLU/RSV plus assay is intended as an aid in the diagnosis of influenza from Nasopharyngeal swab specimens and should not be used as a sole basis for treatment. Nasal washings and aspirates are unacceptable for Xpert Xpress SARS-CoV-2/FLU/RSV testing.  Fact Sheet for Patients: EntrepreneurPulse.com.au  Fact Sheet for Healthcare Providers: IncredibleEmployment.be  This test is not yet approved or cleared by the [place] FDA and has been authorized for detection and/or diagnosis of SARS-CoV-2 by FDA under an Emergency Use Authorization (EUA). This EUA  will remain in effect (meaning this test can be used) for the duration of the COVID-19 declaration under Section 564(b)(1) of the Act, 21 U.S.C. section 360bbb-3(b)(1), unless the authorization is terminated or revoked.  Performed at [facility], [address], [place], [place] [address]      Scheduled Meds:  budesonide (PULMICORT) nebulizer solution  0.5 mg Nebulization BID   carvedilol  6.25 mg Oral BID WC   enoxaparin (LOVENOX) injection  40 mg Subcutaneous Q24H   ferrous sulfate  325 mg Oral Q breakfast   folic acid  1 mg Oral Daily   gabapentin  300 mg Oral BID   ipratropium-albuterol  3 mL Nebulization BID   lipase/protease/amylase  36,000 Units Oral With snacks   lipase/protease/amylase  72,000 Units Oral TID WC   multivitamin with minerals  1 tablet Oral Daily   pantoprazole  40 mg Oral Daily   senna-docusate  1 tablet Oral QHS   thiamine  100 mg Oral Daily   Or   thiamine  100 mg Intravenous Daily   Continuous Infusions:  cefTRIAXone (ROCEPHIN)  IV 1 g (12/04/20 1243)    Procedures/Studies: DG Chest 2 View  Result Date: 11/27/2020 CLINICAL DATA:  Productive cough and shortness of breath. EXAM: CHEST - 2 VIEW COMPARISON:  10/26/2020 FINDINGS: 0949 hours. Low volume film. The cardio pericardial silhouette is enlarged. Interstitial markings are diffusely coarsened with chronic features. Atelectasis or linear scarring noted right mid lung. Telemetry leads overlie the chest. IMPRESSION: Low volume film without acute cardiopulmonary findings. Electronically Signed   By: Misty Stanley M.D.   On: 11/27/2020 10:51   DG Abd 1 View  Result Date: 12/01/2020 CLINICAL DATA:  Abdominal pain and history of pancreatitis EXAM: ABDOMEN - 1 VIEW COMPARISON:  CT from 11/29/2020 FINDINGS: Scattered large and small bowel gas is noted. Multiple edematous loops of small bowel are noted without significant gas within. No true obstructive changes are seen. No free air is noted. Nonobstructing  right renal stone is noted stable in appearance from the prior exam. Chronic changes about the proximal left hip are seen. Degenerative changes of lumbar spine are noted. IMPRESSION: Edematous loops of small bowel likely related to the underlying pancreatitis. No true obstructive changes are seen at this time. Stable right renal stone. Electronically Signed   By:  Inez Catalina M.D.   On: 12/01/2020 15:08   CT Abdomen Pelvis W Contrast  Result Date: 11/29/2020 CLINICAL DATA:  Persistent pancreatitis EXAM: CT ABDOMEN AND PELVIS WITH CONTRAST TECHNIQUE: Multidetector CT imaging of the abdomen and pelvis was performed using the standard protocol following bolus administration of intravenous contrast. Sagittal and coronal MPR images reconstructed from axial data set. CONTRAST:  57mL OMNIPAQUE IOHEXOL 350 MG/ML SOLN IV. No oral contrast. COMPARISON:  10/24/2020 FINDINGS: Lower chest: Minimal bibasilar atelectasis Hepatobiliary: Gallbladder distended with small amount of adjacent fluid, though ascites is seen elsewhere in the upper abdomen as well. No calcifications. Mild intrahepatic and extrahepatic biliary dilatation, with CBD measuring 11 mm Pancreas: Parenchymal atrophy and ductal dilatation. Surrounding edema. Multiple fluid collections consistent with pseudocysts. Fluid collection anterior to proximal pancreatic tail 5.6 x 3.6 cm image 22. Reverse C-shaped collection inferior to pancreatic body and proximal tail 7.3 x 5.3 cm image 34. Collection adjacent to pancreatic body 3.2 x 2.3 cm image 28. Small fluid collection anteriorly in upper abdomen 3.7 x 1.8 cm image 28, potentially communicating with the reverse C-shaped collection. Spleen: Normal appearance Adrenals/Urinary Tract: Adrenal glands normal appearance. Small BILATERAL renal cysts. Large calculus at inferior pole RIGHT kidney 16 x 12 mm image 39. Kidneys, ureters, and bladder otherwise normal appearance. Stomach/Bowel: Appendix not visualized.  Diverticulosis of distal colon without evidence of diverticulitis. Stomach decompressed. Bowel loops otherwise unremarkable. Vascular/Lymphatic: Atherosclerotic calcifications aorta and iliac arteries without aneurysm. Enlargement of cardiac chambers. No adenopathy. Reproductive: Prostate gland normal size. Seminal vesicles unremarkable. Other: Scattered ascites.  No free air.  No hernia. Musculoskeletal: Old BILATERAL pelvic fractures. Old proximal LEFT femoral fracture with deformity. IMPRESSION: Acute pancreatitis with multiple peripancreatic pseudocysts as above, majority new since the prior exam though these correspond in position with areas of inflammation/phlegmon seen on the previous exam. Mild biliary dilatation with CBD measuring 11 mm, recommend correlation with LFTs. Distal colonic diverticulosis without evidence of diverticulitis. Nonobstructing RIGHT renal calculus 16 x 12 mm. Aortic Atherosclerosis (ICD10-I70.0). Electronically Signed   By: Lavonia Dana M.D.   On: 11/29/2020 11:38   MR 3D Recon At Scanner  Result Date: 11/29/2020 CLINICAL DATA:  70 year old male with history of pancreatitis. Biliary ductal dilatation. EXAM: MRI ABDOMEN WITHOUT AND WITH CONTRAST (INCLUDING MRCP) TECHNIQUE: Multiplanar multisequence MR imaging of the abdomen was performed both before and after the administration of intravenous contrast. Heavily T2-weighted images of the biliary and pancreatic ducts were obtained, and three-dimensional MRCP images were rendered by post processing. CONTRAST:  51mL GADAVIST GADOBUTROL 1 MMOL/ML IV SOLN COMPARISON:  No prior abdominal MRI. CT the abdomen and pelvis 11/29/2020. FINDINGS: Lower chest: Unremarkable. Hepatobiliary: No suspicious cystic or solid hepatic lesions. There is mild intrahepatic biliary ductal dilatation. Common hepatic duct is dilated measuring up to 15 mm in diameter. Common bile duct is mildly dilated measuring up to 8 mm in diameter. MRCP images are suboptimal  but demonstrate a potential filling defect in the distal common bile duct estimated to measure approximately 5 mm (coronal image 56 of series 11). However, this cannot be confirmed on additional T2 weighted sequences. There are no filling defects within the gallbladder to suggest cholelithiasis. Gallbladder is moderately distended. Gallbladder wall thickness appears normal. Pancreas: Pancreatic ductal dilatation is again noted throughout the body and tail of the pancreas, measuring up to 9 mm in the body. This ductal dilatation abruptly terminates in the region of the pancreatic head. In this region, no discrete pancreatic mass is confidently  identified on today's examination. There is atrophy in the distal body and tail of the pancreas. Several complex fluid collections emanate from the pancreas and extend into the root of the small bowel mesentery, demonstrating heterogeneous signal intensity on T1 and T2 weighted images, compatible with pancreatic pseudocysts. The largest of these is complex in shape and therefore difficult to measure, but estimated measure approximately 6.2 x 3.0 x 5.5 cm (axial image 24 of series 4 and coronal image 10 of series 5). Spleen:  Unremarkable. Adrenals/Urinary Tract: Multiple small T1 hypointense, T2 hyperintense, nonenhancing lesions are noted in both kidneys, compatible with simple cysts, measuring 1.3 cm or less in size. No definite solid renal lesions are noted. No hydroureteronephrosis in the visualized portions of the abdomen. Bilateral adrenal glands are normal in appearance. Stomach/Bowel: Unremarkable. Vascular/Lymphatic: No aneurysm identified in the visualized abdominal vasculature. Splenic vein, superior mesenteric vein, splenoportal confluence and proximal portal vein are all narrowed secondary to mass effect from the acute pancreatitis. Other:  Small volume of ascites most evident adjacent to the liver. Musculoskeletal: No aggressive appearing osseous lesions are noted  in the visualized portions of the skeleton. IMPRESSION: 1. MRCP images are suboptimal, but suggest the possibility of choledocholithiasis in the distal common bile duct. Notably, however, this could not be confirmed on additional T2 weighted sequences, and would not explain the persistent pancreatic ductal dilatation seen on today's examination. The possibility of a pancreatic head mass is considered, although no discrete mass is confidently identified on today's examination which is confounded by the presence of acute pancreatitis with multiple complex pseudocysts, as discussed above. Electronically Signed   By: Vinnie Langton M.D.   On: 11/29/2020 20:59   DG Chest Port 1 View  Result Date: 11/29/2020 CLINICAL DATA:  Cough, dyspnea, abdominal pain, pancreatitis EXAM: PORTABLE CHEST 1 VIEW COMPARISON:  11/27/2020 chest radiograph. FINDINGS: Stable cardiomediastinal silhouette with mild cardiomegaly. No pneumothorax. No pleural effusion. Lungs appear clear, with no acute consolidative airspace disease and no pulmonary edema. IMPRESSION: Mild cardiomegaly without pulmonary edema. No active pulmonary disease. Electronically Signed   By: Ilona Sorrel M.D.   On: 11/29/2020 12:26   MR ABDOMEN MRCP W WO CONTAST  Result Date: 11/29/2020 CLINICAL DATA:  70 year old male with history of pancreatitis. Biliary ductal dilatation. EXAM: MRI ABDOMEN WITHOUT AND WITH CONTRAST (INCLUDING MRCP) TECHNIQUE: Multiplanar multisequence MR imaging of the abdomen was performed both before and after the administration of intravenous contrast. Heavily T2-weighted images of the biliary and pancreatic ducts were obtained, and three-dimensional MRCP images were rendered by post processing. CONTRAST:  37mL GADAVIST GADOBUTROL 1 MMOL/ML IV SOLN COMPARISON:  No prior abdominal MRI. CT the abdomen and pelvis 11/29/2020. FINDINGS: Lower chest: Unremarkable. Hepatobiliary: No suspicious cystic or solid hepatic lesions. There is mild  intrahepatic biliary ductal dilatation. Common hepatic duct is dilated measuring up to 15 mm in diameter. Common bile duct is mildly dilated measuring up to 8 mm in diameter. MRCP images are suboptimal but demonstrate a potential filling defect in the distal common bile duct estimated to measure approximately 5 mm (coronal image 56 of series 11). However, this cannot be confirmed on additional T2 weighted sequences. There are no filling defects within the gallbladder to suggest cholelithiasis. Gallbladder is moderately distended. Gallbladder wall thickness appears normal. Pancreas: Pancreatic ductal dilatation is again noted throughout the body and tail of the pancreas, measuring up to 9 mm in the body. This ductal dilatation abruptly terminates in the region of the pancreatic head. In this  region, no discrete pancreatic mass is confidently identified on today's examination. There is atrophy in the distal body and tail of the pancreas. Several complex fluid collections emanate from the pancreas and extend into the root of the small bowel mesentery, demonstrating heterogeneous signal intensity on T1 and T2 weighted images, compatible with pancreatic pseudocysts. The largest of these is complex in shape and therefore difficult to measure, but estimated measure approximately 6.2 x 3.0 x 5.5 cm (axial image 24 of series 4 and coronal image 10 of series 5). Spleen:  Unremarkable. Adrenals/Urinary Tract: Multiple small T1 hypointense, T2 hyperintense, nonenhancing lesions are noted in both kidneys, compatible with simple cysts, measuring 1.3 cm or less in size. No definite solid renal lesions are noted. No hydroureteronephrosis in the visualized portions of the abdomen. Bilateral adrenal glands are normal in appearance. Stomach/Bowel: Unremarkable. Vascular/Lymphatic: No aneurysm identified in the visualized abdominal vasculature. Splenic vein, superior mesenteric vein, splenoportal confluence and proximal portal vein are  all narrowed secondary to mass effect from the acute pancreatitis. Other:  Small volume of ascites most evident adjacent to the liver. Musculoskeletal: No aggressive appearing osseous lesions are noted in the visualized portions of the skeleton. IMPRESSION: 1. MRCP images are suboptimal, but suggest the possibility of choledocholithiasis in the distal common bile duct. Notably, however, this could not be confirmed on additional T2 weighted sequences, and would not explain the persistent pancreatic ductal dilatation seen on today's examination. The possibility of a pancreatic head mass is considered, although no discrete mass is confidently identified on today's examination which is confounded by the presence of acute pancreatitis with multiple complex pseudocysts, as discussed above. Electronically Signed   By: Vinnie Langton M.D.   On: 11/29/2020 20:59   ECHOCARDIOGRAM COMPLETE  Result Date: 11/29/2020    ECHOCARDIOGRAM REPORT   Patient Name:   KAELAN AMBLE Date of Exam: 11/29/2020 Medical Rec #:  [id number]       Height:       71.0 in Accession #:    [id number]      Weight:       188.0 lb Date of Birth:  01/18/1951       BSA:          2.054 m Patient Age:    55 years        BP:           148/83 mmHg Patient Gender: M               HR:           98 bpm. Exam Location:  [facility] Procedure: 2D Echo, Cardiac Doppler and Color Doppler Indications:    Cardiomyopathy  History:        Patient has prior history of Echocardiogram examinations, most                 recent 10/16/2018. CHF, COPD; Risk Factors:Hypertension and                 Dyslipidemia. Nonischemic cardiomyopathy, Tobacco abuse.  Sonographer:    Wenda Low Referring Phys: [id number] ANKIT CHIRAG AMIN IMPRESSIONS  1. Global hypokinesis. The anterior, anteroseptal walls are akinetic. Marland Kitchen Left ventricular ejection fraction, by estimation, is 15-20%. The left ventricle has severely decreased function. The left ventricle demonstrates global hypokinesis.  The left ventricular internal cavity size was severely dilated. Left ventricular diastolic parameters are indeterminate.  2. Right ventricular systolic function is normal. The right ventricular size is normal.  3. Left  atrial size was severely dilated.  4. The mitral valve is normal in structure. Trivial mitral valve regurgitation. No evidence of mitral stenosis.  5. The aortic valve is tricuspid. Aortic valve regurgitation is not visualized. No aortic stenosis is present. FINDINGS  Left Ventricle: Global hypokinesis. The anterior, anteroseptal walls are akinetic. Left ventricular ejection fraction, by estimation, is 15-20%. The left ventricle has severely decreased function. The left ventricle demonstrates global hypokinesis. The left ventricular internal cavity size was severely dilated. There is no left ventricular hypertrophy. Left ventricular diastolic parameters are indeterminate. Right Ventricle: The right ventricular size is normal. No increase in right ventricular wall thickness. Right ventricular systolic function is normal. Left Atrium: Left atrial size was severely dilated. Right Atrium: Right atrial size was normal in size. Pericardium: There is no evidence of pericardial effusion. Mitral Valve: The mitral valve is normal in structure. Trivial mitral valve regurgitation. No evidence of mitral valve stenosis. MV peak gradient, 3.1 mmHg. The mean mitral valve gradient is 1.0 mmHg. Tricuspid Valve: The tricuspid valve is not well visualized. Tricuspid valve regurgitation is not demonstrated. No evidence of tricuspid stenosis. Aortic Valve: The aortic valve is tricuspid. Aortic valve regurgitation is not visualized. No aortic stenosis is present. Aortic valve mean gradient measures 3.0 mmHg. Aortic valve peak gradient measures 4.8 mmHg. Aortic valve area, by VTI measures 3.28 cm. Pulmonic Valve: The pulmonic valve was not well visualized. Pulmonic valve regurgitation is not visualized. No evidence of  pulmonic stenosis. Aorta: The aortic root is normal in size and structure. IAS/Shunts: The interatrial septum was not well visualized.  LEFT VENTRICLE PLAX 2D LVIDd:         7.00 cm      Diastology LVIDs:         6.50 cm      LV e' medial:    4.68 cm/s LV PW:         1.10 cm      LV E/e' medial:  17.8 LV IVS:        0.90 cm      LV e' lateral:   10.40 cm/s LVOT diam:     2.00 cm      LV E/e' lateral: 8.0 LV SV:         58 LV SV Index:   28 LVOT Area:     3.14 cm  LV Volumes (MOD) LV vol d, MOD A2C: 288.0 ml LV vol d, MOD A4C: 275.0 ml LV vol s, MOD A2C: 238.0 ml LV vol s, MOD A4C: 199.0 ml LV SV MOD A2C:     50.0 ml LV SV MOD A4C:     275.0 ml LV SV MOD BP:      62.3 ml RIGHT VENTRICLE RV Basal diam:  3.55 cm RV Mid diam:    3.10 cm RV S prime:     13.50 cm/s TAPSE (M-mode): 1.8 cm LEFT ATRIUM              Index       RIGHT ATRIUM           Index LA diam:        4.30 cm  2.09 cm/m  RA Area:     19.30 cm LA Vol (A2C):   113.0 ml 55.02 ml/m RA Volume:   54.80 ml  26.68 ml/m LA Vol (A4C):   126.0 ml 61.35 ml/m LA Biplane Vol: 124.0 ml 60.37 ml/m  AORTIC VALVE  PULMONIC VALVE AV Area (Vmax):    3.23 cm    PV Vmax:       0.80 m/s AV Area (Vmean):   2.89 cm    PV Peak grad:  2.6 mmHg AV Area (VTI):     3.28 cm AV Vmax:           109.00 cm/s AV Vmean:          74.400 cm/s AV VTI:            0.176 m AV Peak Grad:      4.8 mmHg AV Mean Grad:      3.0 mmHg LVOT Vmax:         112.00 cm/s LVOT Vmean:        68.400 cm/s LVOT VTI:          0.184 m LVOT/AV VTI ratio: 1.05  AORTA Ao Root diam: 3.60 cm Ao Asc diam:  3.50 cm MITRAL VALVE MV Area (PHT): 5.42 cm    SHUNTS MV Area VTI:   4.62 cm    Systemic VTI:  0.18 m MV Peak grad:  3.1 mmHg    Systemic Diam: 2.00 cm MV Mean grad:  1.0 mmHg MV Vmax:       0.88 m/s MV Vmean:      52.5 cm/s MV Decel Time: 140 msec MV E velocity: 83.10 cm/s MV A velocity: 62.10 cm/s MV E/A ratio:  1.34 Carlyle Dolly MD Electronically signed by Carlyle Dolly MD Signature  Date/Time: 11/29/2020/4:25:37 PM    Final     Orson Eva, DO  [facility]  If 7PM-7AM, please contact night-coverage [email or web address] Password [email or web address] 12/04/2020, 3:30 PM   LOS: 5 days

## 2020-12-05 DIAGNOSIS — I5022 Chronic systolic (congestive) heart failure: Secondary | ICD-10-CM | POA: Diagnosis not present

## 2020-12-05 DIAGNOSIS — K859 Acute pancreatitis without necrosis or infection, unspecified: Secondary | ICD-10-CM | POA: Diagnosis not present

## 2020-12-05 DIAGNOSIS — K838 Other specified diseases of biliary tract: Secondary | ICD-10-CM

## 2020-12-05 DIAGNOSIS — N182 Chronic kidney disease, stage 2 (mild): Secondary | ICD-10-CM | POA: Diagnosis not present

## 2020-12-05 LAB — COMPREHENSIVE METABOLIC PANEL
ALT: 64 U/L — ABNORMAL HIGH (ref 0–44)
AST: 59 U/L — ABNORMAL HIGH (ref 15–41)
Albumin: 2.8 g/dL — ABNORMAL LOW (ref 3.5–5.0)
Alkaline Phosphatase: 188 U/L — ABNORMAL HIGH (ref 38–126)
Anion gap: 8 (ref 5–15)
BUN: 17 mg/dL (ref 8–23)
CO2: 28 mmol/L (ref 22–32)
Calcium: 8.7 mg/dL — ABNORMAL LOW (ref 8.9–10.3)
Chloride: 106 mmol/L (ref 98–111)
Creatinine, Ser: 0.94 mg/dL (ref 0.61–1.24)
GFR, Estimated: >60 mL/min (ref >60)
Glucose, Bld: 90 mg/dL (ref 70–99)
Potassium: 3.7 mmol/L (ref 3.5–5.1)
Sodium: 142 mmol/L (ref 135–145)
Total Bilirubin: 0.4 mg/dL (ref 0.3–1.2)
Total Protein: 6.2 g/dL — ABNORMAL LOW (ref 6.5–8.1)

## 2020-12-05 LAB — MAGNESIUM: Magnesium: 2.2 mg/dL (ref 1.7–2.4)

## 2020-12-05 MED ORDER — ARFORMOTEROL TARTRATE 15 MCG/2ML IN NEBU
15.0000 ug | INHALATION_SOLUTION | Freq: Two times a day (BID) | RESPIRATORY_TRACT | Status: DC
  Administered 2020-12-05 – 2020-12-07 (×4): 15 ug via RESPIRATORY_TRACT
  Filled 2020-12-05 (×4): qty 2

## 2020-12-05 MED ORDER — METHYLPREDNISOLONE SODIUM SUCC 125 MG IJ SOLR
60.0000 mg | Freq: Two times a day (BID) | INTRAMUSCULAR | Status: DC
  Administered 2020-12-05 – 2020-12-07 (×4): 60 mg via INTRAVENOUS
  Filled 2020-12-05 (×4): qty 2

## 2020-12-05 MED ORDER — HYDROCODONE BIT-HOMATROP MBR 5-1.5 MG/5ML PO SOLN
5.0000 mL | ORAL | Status: DC | PRN
  Administered 2020-12-05: 5 mL via ORAL
  Filled 2020-12-05: qty 5

## 2020-12-05 MED ORDER — CYCLOBENZAPRINE HCL 10 MG PO TABS: 5.0000 mg | ORAL_TABLET | Freq: Three times a day (TID) | ORAL | Status: DC | PRN

## 2020-12-05 NOTE — Progress Notes (Addendum)
PROGRESS NOTE  Ryan [name] VQM:[id number] DOB: 1950-11-11 DOA: 11/29/2020 PCP: [facility]   Brief History:  70 year old with history of tobacco use, alcohol use, CKD stage II, systolic CHF EF 93%, recurrent pancreatitis, GERD, polysubstance abuse admitted for abdominal pain.  He also reported of shortness of breath and cough.  He was diagnosed with acute pancreatitis and COPD exacerbation and admitted to the hospital.  GI team was consulted.  MRCP performed showed  acute pancreatitis with multiple complex Pseudocysts and possible pancreatic duct stone - Conservative management was planned with outpatient EUS.  Repeat echocardiogram in the hospital showed EF of 15-20%.  GI team is following and now conservatively managing his diet.   Assessment/Plan:   Acute on chronic alcoholic pancreatitis - CT shows possible concerns of complicated pancreatitis.  Advance his diet to soft.  Gentle hydration with caution. MRCP showed pseudocyst but no active necrosis -previously changed back to full liquids>>pt wanted to advance to soft foods -continue Creon with meals -triglycerides 93 -advance to soft diet -appreciate GI follow up -still having mod pain, but more manageable   Acute respiratory distress Acute bronchitis with community-acquired pneumonia Acute COPD exacerbation -from COPD exacerbation which is improved.  Procalcitonin 0.10. -Empiric IV Rocephin and azithromycin.  Prednisone day 5 of 5 - Supplemental oxygen.  I-S/flutter - Scheduled and as needed bronchodilators - continue pulmicort -finished 6 days ceftriaxone, 5 days azithro -now stable on RA but more sob and wheeze on 9/25 - 9/25--restart IV steroids - start brovana;   Chronic congestive heart failure with reduced ejection fraction, EF 15%, class II -Repeat echocardiogram shows EF of 15%.  Cardiology team to arrange for outpatient follow-up.  Clinically appears dehydrated, hold Lasix -minimized fluid  due to cardiomyopathy -11/29/20 Echo--EF 15-20%, global HK, trivial MR -continue coreg   Asymptomatic bacteriuria - He is already on Rocephin.  Urine cultures grew multiple species   EtOH Abuse -Alcohol withdrawal protocol.  Thiamine, folic acid and multivitamin -no signs of withdrawal   GERD -PPI   Constipation - continue miralax -add bisacodyl  Back spasm -add flexeril             Status is: Inpatient   Remains inpatient appropriate because:Inpatient level of care appropriate due to severity of illness   Dispo: The patient is from: Home              Anticipated d/c is to: Home              Patient currently is not medically stable to d/c.              Difficult to place patient No               Family Communication:  no Family at bedside   Consultants:  GI   Code Status:  FULL   DVT Prophylaxis:  [place] Lovenox     Procedures: As Listed in Progress Note Above   Antibiotics: Ceftriaxone 9/19>>9/24 Azithro 9/19>>9/23       Subjective: Patient states he continues to postprandial pain, but slowly improving.  Denies f/c, cp, n/v/d.  He has some sob and states cough is not much better.  No hemoptysis, headache, f/c  Objective: Vitals:   12/05/20 0548 12/05/20 0729 12/05/20 0734 12/05/20 0955  BP: 112/82   120/72  Pulse: 90   91  Resp: 18     Temp: 99 F (37.2 C)  TempSrc:      SpO2: 100% 98% 98%   Weight:      Height:        Intake/Output Summary (Last 24 hours) at 12/05/2020 1402 Last data filed at 12/05/2020 1300 Gross per 24 hour  Intake 2280 ml  Output 1600 ml  Net 680 ml   Weight change: 1.404 kg Exam:  General:  Pt is alert, follows commands appropriately, not in acute distress HEENT: No icterus, No thrush, No neck mass, [place]/AT Cardiovascular: RRR, S1/S2, no rubs, no gallops Respiratory: bibasilar rales.  Bilateral exp wheeze Abdomen: Soft/+BS, non tender, non distended, no guarding Extremities: No edema, No lymphangitis, No  petechiae, No rashes, no synovitis   Data Reviewed: I have personally reviewed following labs and imaging studies Basic Metabolic Panel: Recent Labs  Lab 11/30/20 0503 12/01/20 0456 12/02/20 0518 12/03/20 0514 12/04/20 0455 12/05/20 0459  NA 134* 136 137  --  138 142  K 4.1 3.9 3.7  --  4.0 3.7  CL 99 102 102  --  106 106  CO2 28 25 27   --  24 28  GLUCOSE 115* 126* 110*  --  123* 90  BUN 9 9 11   --  17 17  CREATININE 0.83 0.76 0.82  --  0.81 0.94  CALCIUM 7.9* 8.3* 8.3*  --  8.5* 8.7*  MG 2.0 2.2 2.3 2.1 2.2 2.2   Liver Function Tests: Recent Labs  Lab 11/30/20 0503 12/01/20 0456 12/02/20 0518 12/04/20 0455 12/05/20 0459  AST 12* 29 44* 48* 59*  ALT 10 15 33 50* 64*  ALKPHOS 161* 150* 151* 180* 188*  BILITOT 0.7 0.1* 0.1* 0.1* 0.4  PROT 6.4* 6.4* 6.5 6.0* 6.2*  ALBUMIN 2.4* 2.5* 2.6* 2.5* 2.8*   Recent Labs  Lab 11/29/20 0921 12/02/20 0518 12/04/20 0455  LIPASE 87* 131* 94*   No results for input(s): AMMONIA in the last 168 hours. Coagulation Profile: No results for input(s): INR, PROTIME in the last 168 hours. CBC: Recent Labs  Lab 11/29/20 0921 11/30/20 0503 12/01/20 0456 12/01/20 0722 12/02/20 0518 12/03/20 0514  WBC 12.0* 8.6 10.2  --  10.7* 10.4  NEUTROABS 10.4*  --   --   --   --   --   HGB 12.0* 10.5* 7.1* 10.1* 9.9* 9.7*  HCT 36.8* 32.7* 22.4* 30.9* 30.6* 29.6*  MCV 92.9 93.2 98.2  --  94.4 93.4  PLT 306 274 240  --  319 313   Cardiac Enzymes: No results for input(s): CKTOTAL, CKMB, CKMBINDEX, TROPONINI in the last 168 hours. BNP: Invalid input(s): POCBNP CBG: No results for input(s): GLUCAP in the last 168 hours. HbA1C: No results for input(s): HGBA1C in the last 72 hours. Urine analysis:    Component Value Date/Time   COLORURINE YELLOW 11/29/2020 1200   APPEARANCEUR CLEAR 11/29/2020 1200   LABSPEC >1.046 (H) 11/29/2020 1200   PHURINE 6.0 11/29/2020 1200   GLUCOSEU NEGATIVE 11/29/2020 1200   HGBUR SMALL (A) 11/29/2020 1200    BILIRUBINUR NEGATIVE 11/29/2020 1200   KETONESUR 20 (A) 11/29/2020 1200   PROTEINUR 30 (A) 11/29/2020 1200   UROBILINOGEN 0.2 04/23/2013 2034   NITRITE POSITIVE (A) 11/29/2020 1200   LEUKOCYTESUR MODERATE (A) 11/29/2020 1200   Sepsis Labs: @LABRCNTIP (procalcitonin:4,lacticidven:4) ) Recent Results (from the past 240 hour(s))  Resp Panel by RT-PCR (Flu A&B, Covid) Nasopharyngeal Swab     Status: None   Collection Time: 11/27/20 10:27 AM   Specimen: Nasopharyngeal Swab; Nasopharyngeal(NP) swabs in vial transport medium  Result Value Ref Range Status   SARS Coronavirus 2 by RT PCR NEGATIVE NEGATIVE Final    Comment: (NOTE) SARS-CoV-2 target nucleic acids are NOT DETECTED.  The SARS-CoV-2 RNA is generally detectable in upper respiratory specimens during the acute phase of infection. The lowest concentration of SARS-CoV-2 viral copies this assay can detect is 138 copies/mL. A negative result does not preclude SARS-Cov-2 infection and should not be used as the sole basis for treatment or other patient management decisions. A negative result may occur with  improper specimen collection/handling, submission of specimen other than nasopharyngeal swab, presence of viral mutation(s) within the areas targeted by this assay, and inadequate number of viral copies(<138 copies/mL). A negative result must be combined with clinical observations, patient history, and epidemiological information. The expected result is Negative.  Fact Sheet for Patients:  EntrepreneurPulse.com.au  Fact Sheet for Healthcare Providers:  IncredibleEmployment.be  This test is no t yet approved or cleared by the [place] FDA and  has been authorized for detection and/or diagnosis of SARS-CoV-2 by FDA under an Emergency Use Authorization (EUA). This EUA will remain  in effect (meaning this test can be used) for the duration of the COVID-19 declaration under Section 564(b)(1) of  the Act, 21 U.S.C.section 360bbb-3(b)(1), unless the authorization is terminated  or revoked sooner.       Influenza A by PCR NEGATIVE NEGATIVE Final   Influenza B by PCR NEGATIVE NEGATIVE Final    Comment: (NOTE) The Xpert Xpress SARS-CoV-2/FLU/RSV plus assay is intended as an aid in the diagnosis of influenza from Nasopharyngeal swab specimens and should not be used as a sole basis for treatment. Nasal washings and aspirates are unacceptable for Xpert Xpress SARS-CoV-2/FLU/RSV testing.  Fact Sheet for Patients: EntrepreneurPulse.com.au  Fact Sheet for Healthcare Providers: IncredibleEmployment.be  This test is not yet approved or cleared by the [place] FDA and has been authorized for detection and/or diagnosis of SARS-CoV-2 by FDA under an Emergency Use Authorization (EUA). This EUA will remain in effect (meaning this test can be used) for the duration of the COVID-19 declaration under Section 564(b)(1) of the Act, 21 U.S.C. section 360bbb-3(b)(1), unless the authorization is terminated or revoked.  Performed at [facility], [address] [place], [place] [address]   Urine Culture     Status: Abnormal   Collection Time: 11/29/20 12:00 PM   Specimen: Urine, Clean Catch  Result Value Ref Range Status   Specimen Description   Final    URINE, CLEAN CATCH Performed at [facility], [address], [place], [place] [address]    Special Requests   Final    NONE Performed at [facility], [address] [place], [place] [address]    Culture MULTIPLE SPECIES PRESENT, SUGGEST RECOLLECTION (A)  Final   Report Status 12/01/2020 FINAL  Final  Resp Panel by RT-PCR (Flu A&B, Covid)     Status: None   Collection Time: 11/29/20 12:08 PM   Specimen: Nasopharyngeal(NP) swabs in vial transport medium  Result Value Ref Range Status   SARS Coronavirus 2 by RT PCR NEGATIVE NEGATIVE Final    Comment: (NOTE) SARS-CoV-2 target nucleic acids are NOT  DETECTED.  The SARS-CoV-2 RNA is generally detectable in upper respiratory specimens during the acute phase of infection. The lowest concentration of SARS-CoV-2 viral copies this assay can detect is 138 copies/mL. A negative result does not preclude SARS-Cov-2 infection and should not be used as the sole basis for treatment or other patient management decisions. A negative  result may occur with  improper specimen collection/handling, submission of specimen other than nasopharyngeal swab, presence of viral mutation(s) within the areas targeted by this assay, and inadequate number of viral copies(<138 copies/mL). A negative result must be combined with clinical observations, patient history, and epidemiological information. The expected result is Negative.  Fact Sheet for Patients:  EntrepreneurPulse.com.au  Fact Sheet for Healthcare Providers:  IncredibleEmployment.be  This test is no t yet approved or cleared by the [place] FDA and  has been authorized for detection and/or diagnosis of SARS-CoV-2 by FDA under an Emergency Use Authorization (EUA). This EUA will remain  in effect (meaning this test can be used) for the duration of the COVID-19 declaration under Section 564(b)(1) of the Act, 21 U.S.C.section 360bbb-3(b)(1), unless the authorization is terminated  or revoked sooner.       Influenza A by PCR NEGATIVE NEGATIVE Final   Influenza B by PCR NEGATIVE NEGATIVE Final    Comment: (NOTE) The Xpert Xpress SARS-CoV-2/FLU/RSV plus assay is intended as an aid in the diagnosis of influenza from Nasopharyngeal swab specimens and should not be used as a sole basis for treatment. Nasal washings and aspirates are unacceptable for Xpert Xpress SARS-CoV-2/FLU/RSV testing.  Fact Sheet for Patients: EntrepreneurPulse.com.au  Fact Sheet for Healthcare Providers: IncredibleEmployment.be  This test is not yet  approved or cleared by the [place] FDA and has been authorized for detection and/or diagnosis of SARS-CoV-2 by FDA under an Emergency Use Authorization (EUA). This EUA will remain in effect (meaning this test can be used) for the duration of the COVID-19 declaration under Section 564(b)(1) of the Act, 21 U.S.C. section 360bbb-3(b)(1), unless the authorization is terminated or revoked.  Performed at [facility], [address], [place], [place] [address]      Scheduled Meds:  arformoterol  15 mcg Nebulization BID   budesonide (PULMICORT) nebulizer solution  0.5 mg Nebulization BID   carvedilol  6.25 mg Oral BID WC   enoxaparin (LOVENOX) injection  40 mg Subcutaneous Q24H   ferrous sulfate  325 mg Oral Q breakfast   folic acid  1 mg Oral Daily   gabapentin  300 mg Oral BID   ipratropium-albuterol  3 mL Nebulization BID   lipase/protease/amylase  36,000 Units Oral With snacks   lipase/protease/amylase  72,000 Units Oral TID WC   methylPREDNISolone (SOLU-MEDROL) injection  60 mg Intravenous Q12H   multivitamin with minerals  1 tablet Oral Daily   pantoprazole  40 mg Oral Daily   polyethylene glycol  17 g Oral Daily   senna-docusate  1 tablet Oral QHS   thiamine  100 mg Oral Daily   Or   thiamine  100 mg Intravenous Daily   Continuous Infusions:  Procedures/Studies: DG Chest 2 View  Result Date: 11/27/2020 CLINICAL DATA:  Productive cough and shortness of breath. EXAM: CHEST - 2 VIEW COMPARISON:  10/26/2020 FINDINGS: 0949 hours. Low volume film. The cardio pericardial silhouette is enlarged. Interstitial markings are diffusely coarsened with chronic features. Atelectasis or linear scarring noted right mid lung. Telemetry leads overlie the chest. IMPRESSION: Low volume film without acute cardiopulmonary findings. Electronically Signed   By: Misty Stanley M.D.   On: 11/27/2020 10:51   DG Abd 1 View  Result Date: 12/01/2020 CLINICAL DATA:  Abdominal pain and history of  pancreatitis EXAM: ABDOMEN - 1 VIEW COMPARISON:  CT from 11/29/2020 FINDINGS: Scattered large and small bowel gas is noted. Multiple edematous loops of small bowel are noted without significant gas within.  No true obstructive changes are seen. No free air is noted. Nonobstructing right renal stone is noted stable in appearance from the prior exam. Chronic changes about the proximal left hip are seen. Degenerative changes of lumbar spine are noted. IMPRESSION: Edematous loops of small bowel likely related to the underlying pancreatitis. No true obstructive changes are seen at this time. Stable right renal stone. Electronically Signed   By: Inez Catalina M.D.   On: 12/01/2020 15:08   CT Abdomen Pelvis W Contrast  Result Date: 11/29/2020 CLINICAL DATA:  Persistent pancreatitis EXAM: CT ABDOMEN AND PELVIS WITH CONTRAST TECHNIQUE: Multidetector CT imaging of the abdomen and pelvis was performed using the standard protocol following bolus administration of intravenous contrast. Sagittal and coronal MPR images reconstructed from axial data set. CONTRAST:  30mL OMNIPAQUE IOHEXOL 350 MG/ML SOLN IV. No oral contrast. COMPARISON:  10/24/2020 FINDINGS: Lower chest: Minimal bibasilar atelectasis Hepatobiliary: Gallbladder distended with small amount of adjacent fluid, though ascites is seen elsewhere in the upper abdomen as well. No calcifications. Mild intrahepatic and extrahepatic biliary dilatation, with CBD measuring 11 mm Pancreas: Parenchymal atrophy and ductal dilatation. Surrounding edema. Multiple fluid collections consistent with pseudocysts. Fluid collection anterior to proximal pancreatic tail 5.6 x 3.6 cm image 22. Reverse C-shaped collection inferior to pancreatic body and proximal tail 7.3 x 5.3 cm image 34. Collection adjacent to pancreatic body 3.2 x 2.3 cm image 28. Small fluid collection anteriorly in upper abdomen 3.7 x 1.8 cm image 28, potentially communicating with the reverse C-shaped collection.  Spleen: Normal appearance Adrenals/Urinary Tract: Adrenal glands normal appearance. Small BILATERAL renal cysts. Large calculus at inferior pole RIGHT kidney 16 x 12 mm image 39. Kidneys, ureters, and bladder otherwise normal appearance. Stomach/Bowel: Appendix not visualized. Diverticulosis of distal colon without evidence of diverticulitis. Stomach decompressed. Bowel loops otherwise unremarkable. Vascular/Lymphatic: Atherosclerotic calcifications aorta and iliac arteries without aneurysm. Enlargement of cardiac chambers. No adenopathy. Reproductive: Prostate gland normal size. Seminal vesicles unremarkable. Other: Scattered ascites.  No free air.  No hernia. Musculoskeletal: Old BILATERAL pelvic fractures. Old proximal LEFT femoral fracture with deformity. IMPRESSION: Acute pancreatitis with multiple peripancreatic pseudocysts as above, majority new since the prior exam though these correspond in position with areas of inflammation/phlegmon seen on the previous exam. Mild biliary dilatation with CBD measuring 11 mm, recommend correlation with LFTs. Distal colonic diverticulosis without evidence of diverticulitis. Nonobstructing RIGHT renal calculus 16 x 12 mm. Aortic Atherosclerosis (ICD10-I70.0). Electronically Signed   By: Lavonia Dana M.D.   On: 11/29/2020 11:38   MR 3D Recon At Scanner  Result Date: 11/29/2020 CLINICAL DATA:  70 year old male with history of pancreatitis. Biliary ductal dilatation. EXAM: MRI ABDOMEN WITHOUT AND WITH CONTRAST (INCLUDING MRCP) TECHNIQUE: Multiplanar multisequence MR imaging of the abdomen was performed both before and after the administration of intravenous contrast. Heavily T2-weighted images of the biliary and pancreatic ducts were obtained, and three-dimensional MRCP images were rendered by post processing. CONTRAST:  81mL GADAVIST GADOBUTROL 1 MMOL/ML IV SOLN COMPARISON:  No prior abdominal MRI. CT the abdomen and pelvis 11/29/2020. FINDINGS: Lower chest: Unremarkable.  Hepatobiliary: No suspicious cystic or solid hepatic lesions. There is mild intrahepatic biliary ductal dilatation. Common hepatic duct is dilated measuring up to 15 mm in diameter. Common bile duct is mildly dilated measuring up to 8 mm in diameter. MRCP images are suboptimal but demonstrate a potential filling defect in the distal common bile duct estimated to measure approximately 5 mm (coronal image 56 of series 11). However, this cannot be  confirmed on additional T2 weighted sequences. There are no filling defects within the gallbladder to suggest cholelithiasis. Gallbladder is moderately distended. Gallbladder wall thickness appears normal. Pancreas: Pancreatic ductal dilatation is again noted throughout the body and tail of the pancreas, measuring up to 9 mm in the body. This ductal dilatation abruptly terminates in the region of the pancreatic head. In this region, no discrete pancreatic mass is confidently identified on today's examination. There is atrophy in the distal body and tail of the pancreas. Several complex fluid collections emanate from the pancreas and extend into the root of the small bowel mesentery, demonstrating heterogeneous signal intensity on T1 and T2 weighted images, compatible with pancreatic pseudocysts. The largest of these is complex in shape and therefore difficult to measure, but estimated measure approximately 6.2 x 3.0 x 5.5 cm (axial image 24 of series 4 and coronal image 10 of series 5). Spleen:  Unremarkable. Adrenals/Urinary Tract: Multiple small T1 hypointense, T2 hyperintense, nonenhancing lesions are noted in both kidneys, compatible with simple cysts, measuring 1.3 cm or less in size. No definite solid renal lesions are noted. No hydroureteronephrosis in the visualized portions of the abdomen. Bilateral adrenal glands are normal in appearance. Stomach/Bowel: Unremarkable. Vascular/Lymphatic: No aneurysm identified in the visualized abdominal vasculature. Splenic vein,  superior mesenteric vein, splenoportal confluence and proximal portal vein are all narrowed secondary to mass effect from the acute pancreatitis. Other:  Small volume of ascites most evident adjacent to the liver. Musculoskeletal: No aggressive appearing osseous lesions are noted in the visualized portions of the skeleton. IMPRESSION: 1. MRCP images are suboptimal, but suggest the possibility of choledocholithiasis in the distal common bile duct. Notably, however, this could not be confirmed on additional T2 weighted sequences, and would not explain the persistent pancreatic ductal dilatation seen on today's examination. The possibility of a pancreatic head mass is considered, although no discrete mass is confidently identified on today's examination which is confounded by the presence of acute pancreatitis with multiple complex pseudocysts, as discussed above. Electronically Signed   By: Vinnie Langton M.D.   On: 11/29/2020 20:59   DG Chest Port 1 View  Result Date: 11/29/2020 CLINICAL DATA:  Cough, dyspnea, abdominal pain, pancreatitis EXAM: PORTABLE CHEST 1 VIEW COMPARISON:  11/27/2020 chest radiograph. FINDINGS: Stable cardiomediastinal silhouette with mild cardiomegaly. No pneumothorax. No pleural effusion. Lungs appear clear, with no acute consolidative airspace disease and no pulmonary edema. IMPRESSION: Mild cardiomegaly without pulmonary edema. No active pulmonary disease. Electronically Signed   By: Ilona Sorrel M.D.   On: 11/29/2020 12:26   MR ABDOMEN MRCP W WO CONTAST  Result Date: 11/29/2020 CLINICAL DATA:  70 year old male with history of pancreatitis. Biliary ductal dilatation. EXAM: MRI ABDOMEN WITHOUT AND WITH CONTRAST (INCLUDING MRCP) TECHNIQUE: Multiplanar multisequence MR imaging of the abdomen was performed both before and after the administration of intravenous contrast. Heavily T2-weighted images of the biliary and pancreatic ducts were obtained, and three-dimensional MRCP images  were rendered by post processing. CONTRAST:  81mL GADAVIST GADOBUTROL 1 MMOL/ML IV SOLN COMPARISON:  No prior abdominal MRI. CT the abdomen and pelvis 11/29/2020. FINDINGS: Lower chest: Unremarkable. Hepatobiliary: No suspicious cystic or solid hepatic lesions. There is mild intrahepatic biliary ductal dilatation. Common hepatic duct is dilated measuring up to 15 mm in diameter. Common bile duct is mildly dilated measuring up to 8 mm in diameter. MRCP images are suboptimal but demonstrate a potential filling defect in the distal common bile duct estimated to measure approximately 5 mm (coronal image 56  of series 11). However, this cannot be confirmed on additional T2 weighted sequences. There are no filling defects within the gallbladder to suggest cholelithiasis. Gallbladder is moderately distended. Gallbladder wall thickness appears normal. Pancreas: Pancreatic ductal dilatation is again noted throughout the body and tail of the pancreas, measuring up to 9 mm in the body. This ductal dilatation abruptly terminates in the region of the pancreatic head. In this region, no discrete pancreatic mass is confidently identified on today's examination. There is atrophy in the distal body and tail of the pancreas. Several complex fluid collections emanate from the pancreas and extend into the root of the small bowel mesentery, demonstrating heterogeneous signal intensity on T1 and T2 weighted images, compatible with pancreatic pseudocysts. The largest of these is complex in shape and therefore difficult to measure, but estimated measure approximately 6.2 x 3.0 x 5.5 cm (axial image 24 of series 4 and coronal image 10 of series 5). Spleen:  Unremarkable. Adrenals/Urinary Tract: Multiple small T1 hypointense, T2 hyperintense, nonenhancing lesions are noted in both kidneys, compatible with simple cysts, measuring 1.3 cm or less in size. No definite solid renal lesions are noted. No hydroureteronephrosis in the visualized  portions of the abdomen. Bilateral adrenal glands are normal in appearance. Stomach/Bowel: Unremarkable. Vascular/Lymphatic: No aneurysm identified in the visualized abdominal vasculature. Splenic vein, superior mesenteric vein, splenoportal confluence and proximal portal vein are all narrowed secondary to mass effect from the acute pancreatitis. Other:  Small volume of ascites most evident adjacent to the liver. Musculoskeletal: No aggressive appearing osseous lesions are noted in the visualized portions of the skeleton. IMPRESSION: 1. MRCP images are suboptimal, but suggest the possibility of choledocholithiasis in the distal common bile duct. Notably, however, this could not be confirmed on additional T2 weighted sequences, and would not explain the persistent pancreatic ductal dilatation seen on today's examination. The possibility of a pancreatic head mass is considered, although no discrete mass is confidently identified on today's examination which is confounded by the presence of acute pancreatitis with multiple complex pseudocysts, as discussed above. Electronically Signed   By: Vinnie Langton M.D.   On: 11/29/2020 20:59   ECHOCARDIOGRAM COMPLETE  Result Date: 11/29/2020    ECHOCARDIOGRAM REPORT   Patient Name:   Ryan [name] Date of Exam: 11/29/2020 Medical Rec #:  [id number]       Height:       71.0 in Accession #:    [id number]      Weight:       188.0 lb Date of Birth:  1950-05-22       BSA:          2.054 m Patient Age:    71 years        BP:           148/83 mmHg Patient Gender: M               HR:           98 bpm. Exam Location:  [facility] Procedure: 2D Echo, Cardiac Doppler and Color Doppler Indications:    Cardiomyopathy  History:        Patient has prior history of Echocardiogram examinations, most                 recent 10/16/2018. CHF, COPD; Risk Factors:Hypertension and                 Dyslipidemia. Nonischemic cardiomyopathy, Tobacco abuse.  Sonographer:    Wenda Low Referring  Phys: [id number] ANKIT CHIRAG AMIN IMPRESSIONS  1. Global hypokinesis. The anterior, anteroseptal walls are akinetic. Marland Kitchen Left ventricular ejection fraction, by estimation, is 15-20%. The left ventricle has severely decreased function. The left ventricle demonstrates global hypokinesis. The left ventricular internal cavity size was severely dilated. Left ventricular diastolic parameters are indeterminate.  2. Right ventricular systolic function is normal. The right ventricular size is normal.  3. Left atrial size was severely dilated.  4. The mitral valve is normal in structure. Trivial mitral valve regurgitation. No evidence of mitral stenosis.  5. The aortic valve is tricuspid. Aortic valve regurgitation is not visualized. No aortic stenosis is present. FINDINGS  Left Ventricle: Global hypokinesis. The anterior, anteroseptal walls are akinetic. Left ventricular ejection fraction, by estimation, is 15-20%. The left ventricle has severely decreased function. The left ventricle demonstrates global hypokinesis. The left ventricular internal cavity size was severely dilated. There is no left ventricular hypertrophy. Left ventricular diastolic parameters are indeterminate. Right Ventricle: The right ventricular size is normal. No increase in right ventricular wall thickness. Right ventricular systolic function is normal. Left Atrium: Left atrial size was severely dilated. Right Atrium: Right atrial size was normal in size. Pericardium: There is no evidence of pericardial effusion. Mitral Valve: The mitral valve is normal in structure. Trivial mitral valve regurgitation. No evidence of mitral valve stenosis. MV peak gradient, 3.1 mmHg. The mean mitral valve gradient is 1.0 mmHg. Tricuspid Valve: The tricuspid valve is not well visualized. Tricuspid valve regurgitation is not demonstrated. No evidence of tricuspid stenosis. Aortic Valve: The aortic valve is tricuspid. Aortic valve regurgitation is not visualized. No aortic  stenosis is present. Aortic valve mean gradient measures 3.0 mmHg. Aortic valve peak gradient measures 4.8 mmHg. Aortic valve area, by VTI measures 3.28 cm. Pulmonic Valve: The pulmonic valve was not well visualized. Pulmonic valve regurgitation is not visualized. No evidence of pulmonic stenosis. Aorta: The aortic root is normal in size and structure. IAS/Shunts: The interatrial septum was not well visualized.  LEFT VENTRICLE PLAX 2D LVIDd:         7.00 cm      Diastology LVIDs:         6.50 cm      LV e' medial:    4.68 cm/s LV PW:         1.10 cm      LV E/e' medial:  17.8 LV IVS:        0.90 cm      LV e' lateral:   10.40 cm/s LVOT diam:     2.00 cm      LV E/e' lateral: 8.0 LV SV:         58 LV SV Index:   28 LVOT Area:     3.14 cm  LV Volumes (MOD) LV vol d, MOD A2C: 288.0 ml LV vol d, MOD A4C: 275.0 ml LV vol s, MOD A2C: 238.0 ml LV vol s, MOD A4C: 199.0 ml LV SV MOD A2C:     50.0 ml LV SV MOD A4C:     275.0 ml LV SV MOD BP:      62.3 ml RIGHT VENTRICLE RV Basal diam:  3.55 cm RV Mid diam:    3.10 cm RV S prime:     13.50 cm/s TAPSE (M-mode): 1.8 cm LEFT ATRIUM              Index       RIGHT ATRIUM  Index LA diam:        4.30 cm  2.09 cm/m  RA Area:     19.30 cm LA Vol (A2C):   113.0 ml 55.02 ml/m RA Volume:   54.80 ml  26.68 ml/m LA Vol (A4C):   126.0 ml 61.35 ml/m LA Biplane Vol: 124.0 ml 60.37 ml/m  AORTIC VALVE                   PULMONIC VALVE AV Area (Vmax):    3.23 cm    PV Vmax:       0.80 m/s AV Area (Vmean):   2.89 cm    PV Peak grad:  2.6 mmHg AV Area (VTI):     3.28 cm AV Vmax:           109.00 cm/s AV Vmean:          74.400 cm/s AV VTI:            0.176 m AV Peak Grad:      4.8 mmHg AV Mean Grad:      3.0 mmHg LVOT Vmax:         112.00 cm/s LVOT Vmean:        68.400 cm/s LVOT VTI:          0.184 m LVOT/AV VTI ratio: 1.05  AORTA Ao Root diam: 3.60 cm Ao Asc diam:  3.50 cm MITRAL VALVE MV Area (PHT): 5.42 cm    SHUNTS MV Area VTI:   4.62 cm    Systemic VTI:  0.18 m MV Peak  grad:  3.1 mmHg    Systemic Diam: 2.00 cm MV Mean grad:  1.0 mmHg MV Vmax:       0.88 m/s MV Vmean:      52.5 cm/s MV Decel Time: 140 msec MV E velocity: 83.10 cm/s MV A velocity: 62.10 cm/s MV E/A ratio:  1.34 Carlyle Dolly MD Electronically signed by Carlyle Dolly MD Signature Date/Time: 11/29/2020/4:25:37 PM    Final     Orson Eva, DO  [facility]  If 7PM-7AM, please contact night-coverage [email or web address] Password [email or web address] 12/05/2020, 2:02 PM   LOS: 6 days

## 2020-12-06 DIAGNOSIS — K852 Alcohol induced acute pancreatitis without necrosis or infection: Secondary | ICD-10-CM

## 2020-12-06 DIAGNOSIS — N182 Chronic kidney disease, stage 2 (mild): Secondary | ICD-10-CM | POA: Diagnosis not present

## 2020-12-06 DIAGNOSIS — K838 Other specified diseases of biliary tract: Secondary | ICD-10-CM | POA: Diagnosis not present

## 2020-12-06 DIAGNOSIS — I5022 Chronic systolic (congestive) heart failure: Secondary | ICD-10-CM | POA: Diagnosis not present

## 2020-12-06 DIAGNOSIS — K859 Acute pancreatitis without necrosis or infection, unspecified: Secondary | ICD-10-CM | POA: Diagnosis not present

## 2020-12-06 DIAGNOSIS — J441 Chronic obstructive pulmonary disease with (acute) exacerbation: Secondary | ICD-10-CM

## 2020-12-06 LAB — COMPREHENSIVE METABOLIC PANEL
ALT: 75 U/L — ABNORMAL HIGH (ref 0–44)
AST: 58 U/L — ABNORMAL HIGH (ref 15–41)
Albumin: 2.5 g/dL — ABNORMAL LOW (ref 3.5–5.0)
Alkaline Phosphatase: 188 U/L — ABNORMAL HIGH (ref 38–126)
Anion gap: 9 (ref 5–15)
BUN: 19 mg/dL (ref 8–23)
CO2: 25 mmol/L (ref 22–32)
Calcium: 8.5 mg/dL — ABNORMAL LOW (ref 8.9–10.3)
Chloride: 106 mmol/L (ref 98–111)
Creatinine, Ser: 1.17 mg/dL (ref 0.61–1.24)
GFR, Estimated: >60 mL/min (ref >60)
Glucose, Bld: 159 mg/dL — ABNORMAL HIGH (ref 70–99)
Potassium: 4.2 mmol/L (ref 3.5–5.1)
Sodium: 140 mmol/L (ref 135–145)
Total Bilirubin: 0.3 mg/dL (ref 0.3–1.2)
Total Protein: 5.9 g/dL — ABNORMAL LOW (ref 6.5–8.1)

## 2020-12-06 LAB — LIPASE, BLOOD: Lipase: 61 U/L — ABNORMAL HIGH (ref 11–51)

## 2020-12-06 NOTE — Progress Notes (Signed)
PROGRESS NOTE  Ryan [name] EZM:[id number] DOB: [birth year] DOA: 11/29/2020 PCP: [facility]  Brief History:  70 year old with history of tobacco use, alcohol use, CKD stage II, systolic CHF EF 50%, recurrent pancreatitis, GERD, polysubstance abuse admitted for abdominal pain.  He also reported of shortness of breath and cough.  He was diagnosed with acute pancreatitis and COPD exacerbation and admitted to the hospital.  GI team was consulted.  MRCP performed showed  acute pancreatitis with multiple complex Pseudocysts and possible pancreatic duct stone - Conservative management was planned with outpatient EUS.  Repeat echocardiogram in the hospital showed EF of 15-20%.  GI team is following and now conservatively managing his diet.   Assessment/Plan:   Acute on chronic alcoholic pancreatitis - CT shows possible concerns of complicated pancreatitis.  Advance his diet to soft.  Gentle hydration with caution. MRCP showed pseudocyst but no active necrosis -previously changed back to full liquids>>pt wanted to advance to soft foods -continue Creon with meals -triglycerides 93 -advance to soft diet -appreciate GI follow up -still having mod pain, but more manageable and improving   Acute respiratory distress Acute bronchitis with community-acquired pneumonia Acute COPD exacerbation -from COPD exacerbation which is improved.  Procalcitonin 0.10. -Empiric IV Rocephin and azithromycin.  Prednisone day 5 of 5 - Supplemental oxygen.  I-S/flutter - Scheduled and as needed bronchodilators - continue pulmicort -finished 6 days ceftriaxone, 5 days azithro -now stable on RA but more sob and wheeze on 9/25 - 9/25--restart IV steroids>>improving again - started brovana;   Chronic congestive heart failure with reduced ejection fraction, EF 15%, class II -Repeat echocardiogram shows EF of 15%.  Cardiology team to arrange for outpatient follow-up.  Clinically appears  dehydrated, hold Lasix -minimized fluid due to cardiomyopathy -11/29/20 Echo--EF 15-20%, global HK, trivial MR -continue coreg   Asymptomatic bacteriuria - He is already on Rocephin.  Urine cultures grew multiple species   EtOH Abuse -Alcohol withdrawal protocol.  Thiamine, folic acid and multivitamin -no signs of withdrawal   GERD -PPI   Constipation - continue miralax -add bisacodyl   Back spasm -add flexeril             Status is: Inpatient   Remains inpatient appropriate because:Inpatient level of care appropriate due to severity of illness   Dispo: The patient is from: Home              Anticipated d/c is to: Home              Patient currently is not medically stable to d/c.              Difficult to place patient No               Family Communication:  no Family at bedside   Consultants:  GI   Code Status:  FULL   DVT Prophylaxis:  [place] Lovenox     Procedures: As Listed in Progress Note Above   Antibiotics: Ceftriaxone 9/19>>9/24 Azithro 9/19>>9/23         Subjective:  Pt states abd pain is slowly improving.  He is tolerating soft diet.  Breathing better.  Denies f/c, cp, sob, n/v/d Objective: Vitals:   12/06/20 0900 12/06/20 1113 12/06/20 1200 12/06/20 1416  BP: (!) 109/94   139/87  Pulse: 98   89  Resp: (!) 28  20   Temp: 97.9 F (36.6 C)   (!) 97.5 F (36.4  C)  TempSrc: Oral   Oral  SpO2: 97% 97%  100%  Weight: 82.8 kg     Height: 5\' 11"  (1.803 m)       Intake/Output Summary (Last 24 hours) at 12/06/2020 1746 Last data filed at 12/06/2020 1319 Gross per 24 hour  Intake 1200 ml  Output 900 ml  Net 300 ml   Weight change: -3.2 kg Exam:  General:  Pt is alert, follows commands appropriately, not in acute distress HEENT: No icterus, No thrush, No neck mass, [place]/AT Cardiovascular: RRR, S1/S2, no rubs, no gallops Respiratory: bibasilar rales. No wheeze Abdomen: Soft/+BS, upper tender, non distended, no guarding Extremities:  No edema, No lymphangitis, No petechiae, No rashes, no synovitis   Data Reviewed: I have personally reviewed following labs and imaging studies Basic Metabolic Panel: Recent Labs  Lab 12/01/20 0456 12/02/20 0518 12/03/20 0514 12/04/20 0455 12/05/20 0459 12/06/20 0510  NA 136 137  --  138 142 140  K 3.9 3.7  --  4.0 3.7 4.2  CL 102 102  --  106 106 106  CO2 25 27  --  24 28 25   GLUCOSE 126* 110*  --  123* 90 159*  BUN 9 11  --  17 17 19   CREATININE 0.76 0.82  --  0.81 0.94 1.17  CALCIUM 8.3* 8.3*  --  8.5* 8.7* 8.5*  MG 2.2 2.3 2.1 2.2 2.2  --    Liver Function Tests: Recent Labs  Lab 12/01/20 0456 12/02/20 0518 12/04/20 0455 12/05/20 0459 12/06/20 0510  AST 29 44* 48* 59* 58*  ALT 15 33 50* 64* 75*  ALKPHOS 150* 151* 180* 188* 188*  BILITOT 0.1* 0.1* 0.1* 0.4 0.3  PROT 6.4* 6.5 6.0* 6.2* 5.9*  ALBUMIN 2.5* 2.6* 2.5* 2.8* 2.5*   Recent Labs  Lab 12/02/20 0518 12/04/20 0455 12/06/20 0510  LIPASE 131* 94* 61*   No results for input(s): AMMONIA in the last 168 hours. Coagulation Profile: No results for input(s): INR, PROTIME in the last 168 hours. CBC: Recent Labs  Lab 11/30/20 0503 12/01/20 0456 12/01/20 0722 12/02/20 0518 12/03/20 0514  WBC 8.6 10.2  --  10.7* 10.4  HGB 10.5* 7.1* 10.1* 9.9* 9.7*  HCT 32.7* 22.4* 30.9* 30.6* 29.6*  MCV 93.2 98.2  --  94.4 93.4  PLT 274 240  --  319 313   Cardiac Enzymes: No results for input(s): CKTOTAL, CKMB, CKMBINDEX, TROPONINI in the last 168 hours. BNP: Invalid input(s): POCBNP CBG: No results for input(s): GLUCAP in the last 168 hours. HbA1C: No results for input(s): HGBA1C in the last 72 hours. Urine analysis:    Component Value Date/Time   COLORURINE YELLOW 11/29/2020 1200   APPEARANCEUR CLEAR 11/29/2020 1200   LABSPEC >1.046 (H) 11/29/2020 1200   PHURINE 6.0 11/29/2020 1200   GLUCOSEU NEGATIVE 11/29/2020 1200   HGBUR SMALL (A) 11/29/2020 1200   BILIRUBINUR NEGATIVE 11/29/2020 1200   KETONESUR 20  (A) 11/29/2020 1200   PROTEINUR 30 (A) 11/29/2020 1200   UROBILINOGEN 0.2 04/23/2013 2034   NITRITE POSITIVE (A) 11/29/2020 1200   LEUKOCYTESUR MODERATE (A) 11/29/2020 1200   Sepsis Labs: @LABRCNTIP (procalcitonin:4,lacticidven:4) ) Recent Results (from the past 240 hour(s))  Resp Panel by RT-PCR (Flu A&B, Covid) Nasopharyngeal Swab     Status: None   Collection Time: 11/27/20 10:27 AM   Specimen: Nasopharyngeal Swab; Nasopharyngeal(NP) swabs in vial transport medium  Result Value Ref Range Status   SARS Coronavirus 2 by RT PCR NEGATIVE NEGATIVE Final  Comment: (NOTE) SARS-CoV-2 target nucleic acids are NOT DETECTED.  The SARS-CoV-2 RNA is generally detectable in upper respiratory specimens during the acute phase of infection. The lowest concentration of SARS-CoV-2 viral copies this assay can detect is 138 copies/mL. A negative result does not preclude SARS-Cov-2 infection and should not be used as the sole basis for treatment or other patient management decisions. A negative result may occur with  improper specimen collection/handling, submission of specimen other than nasopharyngeal swab, presence of viral mutation(s) within the areas targeted by this assay, and inadequate number of viral copies(<138 copies/mL). A negative result must be combined with clinical observations, patient history, and epidemiological information. The expected result is Negative.  Fact Sheet for Patients:  EntrepreneurPulse.com.au  Fact Sheet for Healthcare Providers:  IncredibleEmployment.be  This test is no t yet approved or cleared by the [place] FDA and  has been authorized for detection and/or diagnosis of SARS-CoV-2 by FDA under an Emergency Use Authorization (EUA). This EUA will remain  in effect (meaning this test can be used) for the duration of the COVID-19 declaration under Section 564(b)(1) of the Act, 21 U.S.C.section 360bbb-3(b)(1), unless  the authorization is terminated  or revoked sooner.       Influenza A by PCR NEGATIVE NEGATIVE Final   Influenza B by PCR NEGATIVE NEGATIVE Final    Comment: (NOTE) The Xpert Xpress SARS-CoV-2/FLU/RSV plus assay is intended as an aid in the diagnosis of influenza from Nasopharyngeal swab specimens and should not be used as a sole basis for treatment. Nasal washings and aspirates are unacceptable for Xpert Xpress SARS-CoV-2/FLU/RSV testing.  Fact Sheet for Patients: EntrepreneurPulse.com.au  Fact Sheet for Healthcare Providers: IncredibleEmployment.be  This test is not yet approved or cleared by the [place] FDA and has been authorized for detection and/or diagnosis of SARS-CoV-2 by FDA under an Emergency Use Authorization (EUA). This EUA will remain in effect (meaning this test can be used) for the duration of the COVID-19 declaration under Section 564(b)(1) of the Act, 21 U.S.C. section 360bbb-3(b)(1), unless the authorization is terminated or revoked.  Performed at [facility], [address], [place], [place] [address]   Urine Culture     Status: Abnormal   Collection Time: 11/29/20 12:00 PM   Specimen: Urine, Clean Catch  Result Value Ref Range Status   Specimen Description   Final    URINE, CLEAN CATCH Performed at [facility], [address], [place], [place] [address]    Special Requests   Final    NONE Performed at [facility], [address] [place], [place] [address]    Culture MULTIPLE SPECIES PRESENT, SUGGEST RECOLLECTION (A)  Final   Report Status 12/01/2020 FINAL  Final  Resp Panel by RT-PCR (Flu A&B, Covid)     Status: None   Collection Time: 11/29/20 12:08 PM   Specimen: Nasopharyngeal(NP) swabs in vial transport medium  Result Value Ref Range Status   SARS Coronavirus 2 by RT PCR NEGATIVE NEGATIVE Final    Comment: (NOTE) SARS-CoV-2 target nucleic acids are NOT DETECTED.  The SARS-CoV-2 RNA is generally  detectable in upper respiratory specimens during the acute phase of infection. The lowest concentration of SARS-CoV-2 viral copies this assay can detect is 138 copies/mL. A negative result does not preclude SARS-Cov-2 infection and should not be used as the sole basis for treatment or other patient management decisions. A negative result may occur with  improper specimen collection/handling, submission of specimen other than nasopharyngeal swab, presence of viral mutation(s)  within the areas targeted by this assay, and inadequate number of viral copies(<138 copies/mL). A negative result must be combined with clinical observations, patient history, and epidemiological information. The expected result is Negative.  Fact Sheet for Patients:  EntrepreneurPulse.com.au  Fact Sheet for Healthcare Providers:  IncredibleEmployment.be  This test is no t yet approved or cleared by the [place] FDA and  has been authorized for detection and/or diagnosis of SARS-CoV-2 by FDA under an Emergency Use Authorization (EUA). This EUA will remain  in effect (meaning this test can be used) for the duration of the COVID-19 declaration under Section 564(b)(1) of the Act, 21 U.S.C.section 360bbb-3(b)(1), unless the authorization is terminated  or revoked sooner.       Influenza A by PCR NEGATIVE NEGATIVE Final   Influenza B by PCR NEGATIVE NEGATIVE Final    Comment: (NOTE) The Xpert Xpress SARS-CoV-2/FLU/RSV plus assay is intended as an aid in the diagnosis of influenza from Nasopharyngeal swab specimens and should not be used as a sole basis for treatment. Nasal washings and aspirates are unacceptable for Xpert Xpress SARS-CoV-2/FLU/RSV testing.  Fact Sheet for Patients: EntrepreneurPulse.com.au  Fact Sheet for Healthcare Providers: IncredibleEmployment.be  This test is not yet approved or cleared by the [place] FDA  and has been authorized for detection and/or diagnosis of SARS-CoV-2 by FDA under an Emergency Use Authorization (EUA). This EUA will remain in effect (meaning this test can be used) for the duration of the COVID-19 declaration under Section 564(b)(1) of the Act, 21 U.S.C. section 360bbb-3(b)(1), unless the authorization is terminated or revoked.  Performed at [facility], [address] [place], [place] [address]      Scheduled Meds:  arformoterol  15 mcg Nebulization BID   budesonide (PULMICORT) nebulizer solution  0.5 mg Nebulization BID   carvedilol  6.25 mg Oral BID WC   enoxaparin (LOVENOX) injection  40 mg Subcutaneous Q24H   ferrous sulfate  325 mg Oral Q breakfast   folic acid  1 mg Oral Daily   gabapentin  300 mg Oral BID   ipratropium-albuterol  3 mL Nebulization BID   lipase/protease/amylase  36,000 Units Oral With snacks   lipase/protease/amylase  72,000 Units Oral TID WC   methylPREDNISolone (SOLU-MEDROL) injection  60 mg Intravenous Q12H   multivitamin with minerals  1 tablet Oral Daily   pantoprazole  40 mg Oral Daily   polyethylene glycol  17 g Oral Daily   senna-docusate  1 tablet Oral QHS   thiamine  100 mg Oral Daily   Or   thiamine  100 mg Intravenous Daily   Continuous Infusions:  Procedures/Studies: DG Chest 2 View  Result Date: 11/27/2020 CLINICAL DATA:  Productive cough and shortness of breath. EXAM: CHEST - 2 VIEW COMPARISON:  10/26/2020 FINDINGS: 0949 hours. Low volume film. The cardio pericardial silhouette is enlarged. Interstitial markings are diffusely coarsened with chronic features. Atelectasis or linear scarring noted right mid lung. Telemetry leads overlie the chest. IMPRESSION: Low volume film without acute cardiopulmonary findings. Electronically Signed   By: Misty Stanley M.D.   On: 11/27/2020 10:51   DG Abd 1 View  Result Date: 12/01/2020 CLINICAL DATA:  Abdominal pain and history of pancreatitis EXAM: ABDOMEN - 1 VIEW COMPARISON:  CT  from 11/29/2020 FINDINGS: Scattered large and small bowel gas is noted. Multiple edematous loops of small bowel are noted without significant gas within. No true obstructive changes are seen. No free air is noted. Nonobstructing right renal stone is noted stable in  appearance from the prior exam. Chronic changes about the proximal left hip are seen. Degenerative changes of lumbar spine are noted. IMPRESSION: Edematous loops of small bowel likely related to the underlying pancreatitis. No true obstructive changes are seen at this time. Stable right renal stone. Electronically Signed   By: Inez Catalina M.D.   On: 12/01/2020 15:08   CT Abdomen Pelvis W Contrast  Result Date: 11/29/2020 CLINICAL DATA:  Persistent pancreatitis EXAM: CT ABDOMEN AND PELVIS WITH CONTRAST TECHNIQUE: Multidetector CT imaging of the abdomen and pelvis was performed using the standard protocol following bolus administration of intravenous contrast. Sagittal and coronal MPR images reconstructed from axial data set. CONTRAST:  40mL OMNIPAQUE IOHEXOL 350 MG/ML SOLN IV. No oral contrast. COMPARISON:  10/24/2020 FINDINGS: Lower chest: Minimal bibasilar atelectasis Hepatobiliary: Gallbladder distended with small amount of adjacent fluid, though ascites is seen elsewhere in the upper abdomen as well. No calcifications. Mild intrahepatic and extrahepatic biliary dilatation, with CBD measuring 11 mm Pancreas: Parenchymal atrophy and ductal dilatation. Surrounding edema. Multiple fluid collections consistent with pseudocysts. Fluid collection anterior to proximal pancreatic tail 5.6 x 3.6 cm image 22. Reverse C-shaped collection inferior to pancreatic body and proximal tail 7.3 x 5.3 cm image 34. Collection adjacent to pancreatic body 3.2 x 2.3 cm image 28. Small fluid collection anteriorly in upper abdomen 3.7 x 1.8 cm image 28, potentially communicating with the reverse C-shaped collection. Spleen: Normal appearance Adrenals/Urinary Tract: Adrenal  glands normal appearance. Small BILATERAL renal cysts. Large calculus at inferior pole RIGHT kidney 16 x 12 mm image 39. Kidneys, ureters, and bladder otherwise normal appearance. Stomach/Bowel: Appendix not visualized. Diverticulosis of distal colon without evidence of diverticulitis. Stomach decompressed. Bowel loops otherwise unremarkable. Vascular/Lymphatic: Atherosclerotic calcifications aorta and iliac arteries without aneurysm. Enlargement of cardiac chambers. No adenopathy. Reproductive: Prostate gland normal size. Seminal vesicles unremarkable. Other: Scattered ascites.  No free air.  No hernia. Musculoskeletal: Old BILATERAL pelvic fractures. Old proximal LEFT femoral fracture with deformity. IMPRESSION: Acute pancreatitis with multiple peripancreatic pseudocysts as above, majority new since the prior exam though these correspond in position with areas of inflammation/phlegmon seen on the previous exam. Mild biliary dilatation with CBD measuring 11 mm, recommend correlation with LFTs. Distal colonic diverticulosis without evidence of diverticulitis. Nonobstructing RIGHT renal calculus 16 x 12 mm. Aortic Atherosclerosis (ICD10-I70.0). Electronically Signed   By: Lavonia Dana M.D.   On: 11/29/2020 11:38   MR 3D Recon At Scanner  Result Date: 11/29/2020 CLINICAL DATA:  70 year old male with history of pancreatitis. Biliary ductal dilatation. EXAM: MRI ABDOMEN WITHOUT AND WITH CONTRAST (INCLUDING MRCP) TECHNIQUE: Multiplanar multisequence MR imaging of the abdomen was performed both before and after the administration of intravenous contrast. Heavily T2-weighted images of the biliary and pancreatic ducts were obtained, and three-dimensional MRCP images were rendered by post processing. CONTRAST:  35mL GADAVIST GADOBUTROL 1 MMOL/ML IV SOLN COMPARISON:  No prior abdominal MRI. CT the abdomen and pelvis 11/29/2020. FINDINGS: Lower chest: Unremarkable. Hepatobiliary: No suspicious cystic or solid hepatic  lesions. There is mild intrahepatic biliary ductal dilatation. Common hepatic duct is dilated measuring up to 15 mm in diameter. Common bile duct is mildly dilated measuring up to 8 mm in diameter. MRCP images are suboptimal but demonstrate a potential filling defect in the distal common bile duct estimated to measure approximately 5 mm (coronal image 56 of series 11). However, this cannot be confirmed on additional T2 weighted sequences. There are no filling defects within the gallbladder to suggest cholelithiasis. Gallbladder is  moderately distended. Gallbladder wall thickness appears normal. Pancreas: Pancreatic ductal dilatation is again noted throughout the body and tail of the pancreas, measuring up to 9 mm in the body. This ductal dilatation abruptly terminates in the region of the pancreatic head. In this region, no discrete pancreatic mass is confidently identified on today's examination. There is atrophy in the distal body and tail of the pancreas. Several complex fluid collections emanate from the pancreas and extend into the root of the small bowel mesentery, demonstrating heterogeneous signal intensity on T1 and T2 weighted images, compatible with pancreatic pseudocysts. The largest of these is complex in shape and therefore difficult to measure, but estimated measure approximately 6.2 x 3.0 x 5.5 cm (axial image 24 of series 4 and coronal image 10 of series 5). Spleen:  Unremarkable. Adrenals/Urinary Tract: Multiple small T1 hypointense, T2 hyperintense, nonenhancing lesions are noted in both kidneys, compatible with simple cysts, measuring 1.3 cm or less in size. No definite solid renal lesions are noted. No hydroureteronephrosis in the visualized portions of the abdomen. Bilateral adrenal glands are normal in appearance. Stomach/Bowel: Unremarkable. Vascular/Lymphatic: No aneurysm identified in the visualized abdominal vasculature. Splenic vein, superior mesenteric vein, splenoportal confluence and  proximal portal vein are all narrowed secondary to mass effect from the acute pancreatitis. Other:  Small volume of ascites most evident adjacent to the liver. Musculoskeletal: No aggressive appearing osseous lesions are noted in the visualized portions of the skeleton. IMPRESSION: 1. MRCP images are suboptimal, but suggest the possibility of choledocholithiasis in the distal common bile duct. Notably, however, this could not be confirmed on additional T2 weighted sequences, and would not explain the persistent pancreatic ductal dilatation seen on today's examination. The possibility of a pancreatic head mass is considered, although no discrete mass is confidently identified on today's examination which is confounded by the presence of acute pancreatitis with multiple complex pseudocysts, as discussed above. Electronically Signed   By: Vinnie Langton M.D.   On: 11/29/2020 20:59   DG Chest Port 1 View  Result Date: 11/29/2020 CLINICAL DATA:  Cough, dyspnea, abdominal pain, pancreatitis EXAM: PORTABLE CHEST 1 VIEW COMPARISON:  11/27/2020 chest radiograph. FINDINGS: Stable cardiomediastinal silhouette with mild cardiomegaly. No pneumothorax. No pleural effusion. Lungs appear clear, with no acute consolidative airspace disease and no pulmonary edema. IMPRESSION: Mild cardiomegaly without pulmonary edema. No active pulmonary disease. Electronically Signed   By: Ilona Sorrel M.D.   On: 11/29/2020 12:26   MR ABDOMEN MRCP W WO CONTAST  Result Date: 11/29/2020 CLINICAL DATA:  70 year old male with history of pancreatitis. Biliary ductal dilatation. EXAM: MRI ABDOMEN WITHOUT AND WITH CONTRAST (INCLUDING MRCP) TECHNIQUE: Multiplanar multisequence MR imaging of the abdomen was performed both before and after the administration of intravenous contrast. Heavily T2-weighted images of the biliary and pancreatic ducts were obtained, and three-dimensional MRCP images were rendered by post processing. CONTRAST:  38mL  GADAVIST GADOBUTROL 1 MMOL/ML IV SOLN COMPARISON:  No prior abdominal MRI. CT the abdomen and pelvis 11/29/2020. FINDINGS: Lower chest: Unremarkable. Hepatobiliary: No suspicious cystic or solid hepatic lesions. There is mild intrahepatic biliary ductal dilatation. Common hepatic duct is dilated measuring up to 15 mm in diameter. Common bile duct is mildly dilated measuring up to 8 mm in diameter. MRCP images are suboptimal but demonstrate a potential filling defect in the distal common bile duct estimated to measure approximately 5 mm (coronal image 56 of series 11). However, this cannot be confirmed on additional T2 weighted sequences. There are no filling defects within  the gallbladder to suggest cholelithiasis. Gallbladder is moderately distended. Gallbladder wall thickness appears normal. Pancreas: Pancreatic ductal dilatation is again noted throughout the body and tail of the pancreas, measuring up to 9 mm in the body. This ductal dilatation abruptly terminates in the region of the pancreatic head. In this region, no discrete pancreatic mass is confidently identified on today's examination. There is atrophy in the distal body and tail of the pancreas. Several complex fluid collections emanate from the pancreas and extend into the root of the small bowel mesentery, demonstrating heterogeneous signal intensity on T1 and T2 weighted images, compatible with pancreatic pseudocysts. The largest of these is complex in shape and therefore difficult to measure, but estimated measure approximately 6.2 x 3.0 x 5.5 cm (axial image 24 of series 4 and coronal image 10 of series 5). Spleen:  Unremarkable. Adrenals/Urinary Tract: Multiple small T1 hypointense, T2 hyperintense, nonenhancing lesions are noted in both kidneys, compatible with simple cysts, measuring 1.3 cm or less in size. No definite solid renal lesions are noted. No hydroureteronephrosis in the visualized portions of the abdomen. Bilateral adrenal glands are  normal in appearance. Stomach/Bowel: Unremarkable. Vascular/Lymphatic: No aneurysm identified in the visualized abdominal vasculature. Splenic vein, superior mesenteric vein, splenoportal confluence and proximal portal vein are all narrowed secondary to mass effect from the acute pancreatitis. Other:  Small volume of ascites most evident adjacent to the liver. Musculoskeletal: No aggressive appearing osseous lesions are noted in the visualized portions of the skeleton. IMPRESSION: 1. MRCP images are suboptimal, but suggest the possibility of choledocholithiasis in the distal common bile duct. Notably, however, this could not be confirmed on additional T2 weighted sequences, and would not explain the persistent pancreatic ductal dilatation seen on today's examination. The possibility of a pancreatic head mass is considered, although no discrete mass is confidently identified on today's examination which is confounded by the presence of acute pancreatitis with multiple complex pseudocysts, as discussed above. Electronically Signed   By: Vinnie Langton M.D.   On: 11/29/2020 20:59   ECHOCARDIOGRAM COMPLETE  Result Date: 11/29/2020    ECHOCARDIOGRAM REPORT   Patient Name:   Ryan [name] Date of Exam: 11/29/2020 Medical Rec #:  [id number]       Height:       71.0 in Accession #:    [id number]      Weight:       188.0 lb Date of Birth:  [birth year]       BSA:          2.054 m Patient Age:    26 years        BP:           148/83 mmHg Patient Gender: M               HR:           98 bpm. Exam Location:  [facility] Procedure: 2D Echo, Cardiac Doppler and Color Doppler Indications:    Cardiomyopathy  History:        Patient has prior history of Echocardiogram examinations, most                 recent 10/16/2018. CHF, COPD; Risk Factors:Hypertension and                 Dyslipidemia. Nonischemic cardiomyopathy, Tobacco abuse.  Sonographer:    Wenda Low Referring Phys: [id number] ANKIT CHIRAG AMIN IMPRESSIONS  1. Global  hypokinesis. The anterior, anteroseptal walls are akinetic. Marland Kitchen Left ventricular  ejection fraction, by estimation, is 15-20%. The left ventricle has severely decreased function. The left ventricle demonstrates global hypokinesis. The left ventricular internal cavity size was severely dilated. Left ventricular diastolic parameters are indeterminate.  2. Right ventricular systolic function is normal. The right ventricular size is normal.  3. Left atrial size was severely dilated.  4. The mitral valve is normal in structure. Trivial mitral valve regurgitation. No evidence of mitral stenosis.  5. The aortic valve is tricuspid. Aortic valve regurgitation is not visualized. No aortic stenosis is present. FINDINGS  Left Ventricle: Global hypokinesis. The anterior, anteroseptal walls are akinetic. Left ventricular ejection fraction, by estimation, is 15-20%. The left ventricle has severely decreased function. The left ventricle demonstrates global hypokinesis. The left ventricular internal cavity size was severely dilated. There is no left ventricular hypertrophy. Left ventricular diastolic parameters are indeterminate. Right Ventricle: The right ventricular size is normal. No increase in right ventricular wall thickness. Right ventricular systolic function is normal. Left Atrium: Left atrial size was severely dilated. Right Atrium: Right atrial size was normal in size. Pericardium: There is no evidence of pericardial effusion. Mitral Valve: The mitral valve is normal in structure. Trivial mitral valve regurgitation. No evidence of mitral valve stenosis. MV peak gradient, 3.1 mmHg. The mean mitral valve gradient is 1.0 mmHg. Tricuspid Valve: The tricuspid valve is not well visualized. Tricuspid valve regurgitation is not demonstrated. No evidence of tricuspid stenosis. Aortic Valve: The aortic valve is tricuspid. Aortic valve regurgitation is not visualized. No aortic stenosis is present. Aortic valve mean gradient measures  3.0 mmHg. Aortic valve peak gradient measures 4.8 mmHg. Aortic valve area, by VTI measures 3.28 cm. Pulmonic Valve: The pulmonic valve was not well visualized. Pulmonic valve regurgitation is not visualized. No evidence of pulmonic stenosis. Aorta: The aortic root is normal in size and structure. IAS/Shunts: The interatrial septum was not well visualized.  LEFT VENTRICLE PLAX 2D LVIDd:         7.00 cm      Diastology LVIDs:         6.50 cm      LV e' medial:    4.68 cm/s LV PW:         1.10 cm      LV E/e' medial:  17.8 LV IVS:        0.90 cm      LV e' lateral:   10.40 cm/s LVOT diam:     2.00 cm      LV E/e' lateral: 8.0 LV SV:         58 LV SV Index:   28 LVOT Area:     3.14 cm  LV Volumes (MOD) LV vol d, MOD A2C: 288.0 ml LV vol d, MOD A4C: 275.0 ml LV vol s, MOD A2C: 238.0 ml LV vol s, MOD A4C: 199.0 ml LV SV MOD A2C:     50.0 ml LV SV MOD A4C:     275.0 ml LV SV MOD BP:      62.3 ml RIGHT VENTRICLE RV Basal diam:  3.55 cm RV Mid diam:    3.10 cm RV S prime:     13.50 cm/s TAPSE (M-mode): 1.8 cm LEFT ATRIUM              Index       RIGHT ATRIUM           Index LA diam:        4.30 cm  2.09 cm/m  RA Area:  19.30 cm LA Vol (A2C):   113.0 ml 55.02 ml/m RA Volume:   54.80 ml  26.68 ml/m LA Vol (A4C):   126.0 ml 61.35 ml/m LA Biplane Vol: 124.0 ml 60.37 ml/m  AORTIC VALVE                   PULMONIC VALVE AV Area (Vmax):    3.23 cm    PV Vmax:       0.80 m/s AV Area (Vmean):   2.89 cm    PV Peak grad:  2.6 mmHg AV Area (VTI):     3.28 cm AV Vmax:           109.00 cm/s AV Vmean:          74.400 cm/s AV VTI:            0.176 m AV Peak Grad:      4.8 mmHg AV Mean Grad:      3.0 mmHg LVOT Vmax:         112.00 cm/s LVOT Vmean:        68.400 cm/s LVOT VTI:          0.184 m LVOT/AV VTI ratio: 1.05  AORTA Ao Root diam: 3.60 cm Ao Asc diam:  3.50 cm MITRAL VALVE MV Area (PHT): 5.42 cm    SHUNTS MV Area VTI:   4.62 cm    Systemic VTI:  0.18 m MV Peak grad:  3.1 mmHg    Systemic Diam: 2.00 cm MV Mean grad:  1.0  mmHg MV Vmax:       0.88 m/s MV Vmean:      52.5 cm/s MV Decel Time: 140 msec MV E velocity: 83.10 cm/s MV A velocity: 62.10 cm/s MV E/A ratio:  1.34 Carlyle Dolly MD Electronically signed by Carlyle Dolly MD Signature Date/Time: 11/29/2020/4:25:37 PM    Final     Orson Eva, DO  [facility]  If 7PM-7AM, please contact night-coverage [email or web address] Password [email or web address] 12/06/2020, 5:46 PM   LOS: 7 days

## 2020-12-06 NOTE — Progress Notes (Signed)
Subjective: Epigastric and LUQ pain noted but improving since admission. Mild amount of discomfort with eating soft foods. Not worsening. No N/V.  No right-sided abdominal pain. Eager to go home when able. Reports cough for 2 months.   Objective: Vital signs in last 24 hours: Temp:  [97.9 F (36.6 C)-98.4 F (36.9 C)] 97.9 F (36.6 C) (09/26 0900) Pulse Rate:  [86-98] 98 (09/26 0900) Resp:  [18-28] 28 (09/26 0900) BP: (109-147)/(82-94) 109/94 (09/26 0900) SpO2:  [97 %-100 %] 97 % (09/26 1113) Weight:  [82.8 kg] 82.8 kg (09/26 0900) Last BM Date: 12/05/20 General:   Alert and oriented, pleasant Head:  Normocephalic and atraumatic. Abdomen:  Bowel sounds present, soft, mild TTP epigastric and LUQ Msk:  Symmetrical without gross deformities. Normal posture. Neurologic:  Alert and  oriented x4   Intake/Output from previous day: 09/25 0701 - 09/26 0700 In: 1960 [P.O.:1960] Out: 1900 [Urine:1900] Intake/Output this shift: Total I/O In: 360 [P.O.:360] Out: -   Lab Results  Component Value Date   WBC 10.4 12/03/2020   HGB 9.7 (L) 12/03/2020   HCT 29.6 (L) 12/03/2020   MCV 93.4 12/03/2020   PLT 313 12/03/2020    BMET Recent Labs    12/04/20 0455 12/05/20 0459 12/06/20 0510  NA 138 142 140  K 4.0 3.7 4.2  CL 106 106 106  CO2 24 28 25  GLUCOSE 123* 90 159*  BUN 17 17 19  CREATININE 0.81 0.94 1.17  CALCIUM 8.5* 8.7* 8.5*   LFT Recent Labs    12/04/20 0455 12/05/20 0459 12/06/20 0510  PROT 6.0* 6.2* 5.9*  ALBUMIN 2.5* 2.8* 2.5*  AST 48* 59* 58*  ALT 50* 64* 75*  ALKPHOS 180* 188* 188*  BILITOT 0.1* 0.4 0.3     Assessment: 70-year-old male with past medical history of CHF with EF of 15 to 20%, CKD, polysubstance abuse, GERD, 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     MRI/MRCP this admission with mild intrahepatic biliary ductal dilation, common hepatic duct dilated up to 15 mm, CBD dilated up to 8 mm.  Potential filling defect noted in the distal common bile duct approximately 5 mm, unable to confirm.  No gallbladder defects to suggest cholelithiasis.  No pancreatic mass definitively identified. Plans have been put in place for outpatient appointment with Dr. Mansouraty on October 20th and CT with pancreatic protocol to be completed prior.   Initially, transaminases were normal on admission and alk phos elevated at 209. Transaminases have slowly increased and now ASt 58, ALT 75, Alk Phos 188, and Tbili remaining normal. No RUQ pain or worsening of pain. He is tolerating a soft diet.   Normocytic anemia noted without overt GI bleeding. Low iron at 15 and normal ferritin at 211. Last colonoscopy in 2019 with surveillance due 2022. EGD Nov 2019 with congested duodenal mucosa. Can address surveillance colonoscopy as outpatient.   Clinically, he is improving and tolerating soft diet.   Plan: Keep outpatient appointment with Dr. Mansouraty. Further determination regarding EUS +/- ERCP 2. Creon 72,000 units with meals and 36,000 units with snacks 3. PPI daily 4.Repeat HFP in am 5. Colonoscopy later this year as outpatient if health permits    W. , PhD, ANP-BC [facility]    LOS: 7 days    12/06/2020, 11:17 AM    

## 2020-12-07 ENCOUNTER — Telehealth (INDEPENDENT_AMBULATORY_CARE_PROVIDER_SITE_OTHER): Payer: Self-pay | Admitting: *Deleted

## 2020-12-07 DIAGNOSIS — K859 Acute pancreatitis without necrosis or infection, unspecified: Secondary | ICD-10-CM | POA: Diagnosis not present

## 2020-12-07 DIAGNOSIS — N182 Chronic kidney disease, stage 2 (mild): Secondary | ICD-10-CM | POA: Diagnosis not present

## 2020-12-07 DIAGNOSIS — K852 Alcohol induced acute pancreatitis without necrosis or infection: Secondary | ICD-10-CM | POA: Diagnosis not present

## 2020-12-07 DIAGNOSIS — K861 Other chronic pancreatitis: Secondary | ICD-10-CM | POA: Diagnosis not present

## 2020-12-07 DIAGNOSIS — K838 Other specified diseases of biliary tract: Secondary | ICD-10-CM | POA: Diagnosis not present

## 2020-12-07 DIAGNOSIS — I5022 Chronic systolic (congestive) heart failure: Secondary | ICD-10-CM | POA: Diagnosis not present

## 2020-12-07 MED ORDER — PREDNISONE 10 MG PO TABS: 60.0000 mg | ORAL_TABLET | Freq: Every day | ORAL | 0 refills | Status: DC

## 2020-12-07 MED ORDER — PREDNISONE 20 MG PO TABS: 60.0000 mg | ORAL_TABLET | Freq: Every day | ORAL | Status: DC

## 2020-12-07 MED ORDER — OXYCODONE-ACETAMINOPHEN 5-325 MG PO TABS: 2.0000 | ORAL_TABLET | Freq: Three times a day (TID) | ORAL | 0 refills | Status: DC | PRN

## 2020-12-07 NOTE — Care Management Important Message (Signed)
Important Message  Patient Details  Name: Ryan [name] MRN: [id number] Date of Birth: 07-28-1950   Medicare Important Message Given:  Yes     Tommy Medal 12/07/2020, 12:51 PM

## 2020-12-07 NOTE — Progress Notes (Signed)
Subjective: Feels about the same as yesterday.  Still with intermittent mild pain across the upper abdomen.  Occasionally worsened by eating, but nothing significant.  States this is manageable and is ready to go home today. Says he already has his ride set up.  Denies nausea or vomiting.  Tolerating a soft diet well.  Objective: Vital signs in last 24 hours: Temp:  [97.5 F (36.4 C)-98.7 F (37.1 C)] 98.7 F (37.1 C) (09/27 0607) Pulse Rate:  [89-100] 100 (09/27 0607) Resp:  [19-20] 19 (09/27 0607) BP: (136-139)/(87-92) 136/92 (09/27 0607) SpO2:  [97 %-100 %] 100 % (09/27 0716) Weight:  [89.3 kg] 89.3 kg (09/27 0714) Last BM Date: 12/06/20 General:   Alert and oriented, pleasant, no acute distress. Head:  Normocephalic and atraumatic. Eyes:  No icterus, sclera clear. Conjuctiva pink.  Abdomen:  Bowel sounds present, soft, non-distended. Mild TTP across upper abdomen. No HSM or hernias noted. No rebound or guarding. No masses appreciated  Msk:  Symmetrical without gross deformities. Normal posture. Extremities:  Without edema. Neurologic:  Alert and  oriented x4;  grossly normal neurologically. Skin:  Warm and dry, intact without significant lesions.  Psych:  Normal mood and affect.  Intake/Output from previous day: 09/26 0701 - 09/27 0700 In: 1080 [P.O.:1080] Out: 400 [Urine:400] Intake/Output this shift: Total I/O In: 360 [P.O.:360] Out: 400 [Urine:400]  Lab Results: No results for input(s): WBC, HGB, HCT, PLT in the last 72 hours. 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      MRI/MRCP this admission with mild intrahepatic biliary ductal dilation, common hepatic duct dilated up to 15 mm, CBD dilated up to 8 mm.  Potential filling defect noted in the distal common bile duct approximately 5 mm, unable to confirm.  No gallbladder defects to suggest cholelithiasis.  No pancreatic mass definitively identified. Per review of outside documentation, pancreatic and biliary duct dilation has been present at least since November 2021 with concern for pancreatic head mass with possible duodenal involvement with plans for EUS previously, but this has not been completed. Plans have been put in place for outpatient appointment with Dr. Rush Landmark on October 20th and CT with pancreatic protocol to be completed prior. Will consider EUS +/- ERCP at that time.   Initially, transaminases were normal on admission and alk phos elevated at 209. Transaminases have slowly increased, yesterday AST 58, ALT 75, Alk Phos 188, and Tbili remaining normal. Encouragingly, he has been tolerating a soft diet without any significant worsening of his pain. He does continue with intermittent pain across the entire upper abdomen, but much improved since admission, thus no plans for inpatient evaluation unless significant LFT elevation/worsening clinically. Will repeat CMP today.   Normocytic anemia noted without overt  GI bleeding. Low iron at 15 and normal ferritin at 211. Last colonoscopy in 2019 with surveillance due 2022. EGD Nov 2019 with congested duodenal mucosa. Can address surveillance colonoscopy as outpatient.   Dysphagia: Chronic, intermittent pill dysphagia with large pills.  Chronically on PPI.  No significant GERD.   Consider outpatient EGD.   Plan: Recheck LFTs today. If stable, could consider discharge home from a GI standpoint as patient is eager to go home.  Keep outpatient appointment with Dr. Rush Landmark. Further determination regarding EUS +/- ERCP at that time.  Dr. Donneta Romberg office is arranging CT A/P with pancreatic protocol prior to his office visit. Continue Creon 72,000 units with meals and 36,000 units with snacks Continue PPI daily Colonoscopy later this year as outpatient if health permits Consider EGD outpatient for dysphagia.    LOS: 8 days    12/07/2020, 9:43 AM   Aliene Altes, PA-C [facility]

## 2020-12-07 NOTE — Discharge Summary (Addendum)
Physician Discharge Summary  ALECXANDER [name] SNK:[id number] DOB: 1950/11/22 DOA: 11/29/2020  PCP: [facility]  Admit date: 11/29/2020 Discharge date: 12/07/2020  Admitted From: Home Disposition:  Home  Recommendations for Outpatient Follow-up:  Follow up with PCP in 1-2 weeks Please obtain BMP/CBC in one week    Discharge Condition: Stable CODE STATUS:FULL Diet recommendation: Heart Healthy    Brief/Interim Summary: 70 year old with history of tobacco use, alcohol use, CKD stage II, systolic CHF EF 93%, recurrent pancreatitis, GERD, polysubstance abuse admitted for abdominal pain.  He also reported of shortness of breath and cough.  He was diagnosed with acute pancreatitis and COPD exacerbation and admitted to the hospital.  GI team was consulted.  MRCP performed showed  acute pancreatitis with multiple complex Pseudocysts and possible pancreatic duct stone - Conservative management was planned with outpatient EUS.  Repeat echocardiogram in the hospital showed EF of 15-20%.  GI team is following and now conservatively managing his diet.  He was placed on IV opioids and bowel rest.  His diet was slowly advanced which he tolerated  Discharge Diagnoses:   Acute on chronic alcoholic pancreatitis - CT shows possible concerns of complicated pancreatitis.  Advance his diet to soft.  Gentle hydration with caution. MRCP showed pseudocyst but no active necrosis -previously changed back to full liquids>>pt wanted to advance to soft foods -continue Creon with meals -triglycerides 93 -advance to soft diet -appreciate GI follow up -still having mild pain, but more manageable and improving -GI assisted in arranging outpatient followup with Dr. Rush Landmark for possible EUS +/- EUS gastrostomy +/- ERCP   Acute respiratory distress Acute bronchitis with community-acquired pneumonia Acute COPD exacerbation -from COPD exacerbation which is improved.  Procalcitonin 0.10. -Empiric IV  Rocephin and azithromycin.  Prednisone day 5 of 5 - Supplemental oxygen.  I-S/flutter - Scheduled and as needed bronchodilators - continue pulmicort -finished 6 days ceftriaxone, 5 days azithro -now stable on RA but more sob and wheeze on 9/25 - 9/25--restart IV steroids>>improving again - started brovana; - d/c home with prednisone taper -stable on RA   Chronic congestive heart failure with reduced ejection fraction, EF 15%, class II -Repeat echocardiogram shows EF of 15%.  Cardiology team to arrange for outpatient follow-up.  Clinically appears dehydrated, hold Lasix -minimized fluid due to cardiomyopathy -11/29/20 Echo--EF 15-20%, global HK, trivial MR -continue coreg -restart entresto   Asymptomatic bacteriuria - He is already on Rocephin.  Urine cultures grew multiple species   EtOH Abuse -Alcohol withdrawal protocol.  Thiamine, folic acid and multivitamin -no signs of withdrawal   GERD -PPI   Constipation - continue miralax -add bisacodyl   Back spasm -add flexeril  Discharge Instructions   Allergies as of 12/07/2020       Reactions   Tape Other (See Comments)   "TAPE TEARS OFF MY SKIN"        Medication List     STOP taking these medications    oxyCODONE 5 MG immediate release tablet Commonly known as: Oxy IR/ROXICODONE   oxyCODONE-acetaminophen 10-325 MG tablet Commonly known as: Percocet Replaced by: oxyCODONE-acetaminophen 5-325 MG tablet   senna-docusate 8.6-50 MG tablet Commonly known as: Senokot-S       TAKE these medications    acetaminophen 325 MG tablet Commonly known as: TYLENOL Take 650 mg by mouth every 6 (six) hours as needed for mild pain.   albuterol 108 (90 Base) MCG/ACT inhaler Commonly known as: VENTOLIN HFA Inhale 2 puffs into the lungs every 6 (six)  hours as needed for wheezing or shortness of breath.   bismuth subsalicylate 782 NF/62ZH suspension Commonly known as: PEPTO BISMOL Take 30 mLs by mouth every 6 (six)  hours as needed for indigestion.   carvedilol 6.25 MG tablet Commonly known as: COREG Take 3.125 mg by mouth once a week.   digoxin 0.125 MG tablet Commonly known as: LANOXIN Take 0.125 mg by mouth daily as needed (blood pressure).   furosemide 40 MG tablet Commonly known as: LASIX Take 1 tablet (40 mg total) by mouth 2 (two) times daily for 4 days. What changed: Another medication with the same name was removed. Continue taking this medication, and follow the directions you see here.   gabapentin 300 MG capsule Commonly known as: NEURONTIN Take 300 mg by mouth 2 (two) times daily.   guaiFENesin 100 MG/5ML liquid Commonly known as: ROBITUSSIN Take 200 mg by mouth every 12 (twelve) hours as needed for cough.   ivabradine 5 MG Tabs tablet Commonly known as: CORLANOR Take 1 tablet (5 mg total) by mouth 2 (two) times daily with a meal. What changed:  when to take this reasons to take this   loratadine 10 MG tablet Commonly known as: CLARITIN Take 10 mg by mouth at bedtime.   Multi-Vitamin tablet Take 1 tablet by mouth daily.   naloxone 4 MG/0.1ML Liqd nasal spray kit Commonly known as: NARCAN Place 1 spray into the nose once as needed (overdose).   oxyCODONE-acetaminophen 5-325 MG tablet Commonly known as: PERCOCET/ROXICET Take 2 tablets by mouth every 8 (eight) hours as needed for severe pain. Replaces: oxyCODONE-acetaminophen 10-325 MG tablet   Pancrelipase (Lip-Prot-Amyl) 24000-76000 units Cpep Take 1 capsule (24,000 Units total) by mouth 3 (three) times daily before meals.   pantoprazole 40 MG tablet Commonly known as: PROTONIX Take 1 tablet (40 mg total) by mouth daily. What changed:  when to take this reasons to take this   polyethylene glycol 17 g packet Commonly known as: MIRALAX / GLYCOLAX Take 17 g by mouth 2 (two) times daily. What changed:  when to take this reasons to take this   predniSONE 10 MG tablet Commonly known as: DELTASONE Take 6  tablets (60 mg total) by mouth daily with breakfast. And decrease by one tablet daily Start taking on: December 08, 2020   sacubitril-valsartan 24-26 MG Commonly known as: ENTRESTO Take 1 tablet by mouth 2 (two) times daily.        Allergies  Allergen Reactions   Tape Other (See Comments)    "TAPE TEARS OFF MY SKIN"    Consultations: GI   Procedures/Studies: DG Chest 2 View  Result Date: 11/27/2020 CLINICAL DATA:  Productive cough and shortness of breath. EXAM: CHEST - 2 VIEW COMPARISON:  10/26/2020 FINDINGS: 0949 hours. Low volume film. The cardio pericardial silhouette is enlarged. Interstitial markings are diffusely coarsened with chronic features. Atelectasis or linear scarring noted right mid lung. Telemetry leads overlie the chest. IMPRESSION: Low volume film without acute cardiopulmonary findings. Electronically Signed   By: Misty Stanley M.D.   On: 11/27/2020 10:51   DG Abd 1 View  Result Date: 12/01/2020 CLINICAL DATA:  Abdominal pain and history of pancreatitis EXAM: ABDOMEN - 1 VIEW COMPARISON:  CT from 11/29/2020 FINDINGS: Scattered large and small bowel gas is noted. Multiple edematous loops of small bowel are noted without significant gas within. No true obstructive changes are seen. No free air is noted. Nonobstructing right renal stone is noted stable in appearance from the prior exam.  Chronic changes about the proximal left hip are seen. Degenerative changes of lumbar spine are noted. IMPRESSION: Edematous loops of small bowel likely related to the underlying pancreatitis. No true obstructive changes are seen at this time. Stable right renal stone. Electronically Signed   By: Inez Catalina M.D.   On: 12/01/2020 15:08   CT Abdomen Pelvis W Contrast  Result Date: 11/29/2020 CLINICAL DATA:  Persistent pancreatitis EXAM: CT ABDOMEN AND PELVIS WITH CONTRAST TECHNIQUE: Multidetector CT imaging of the abdomen and pelvis was performed using the standard protocol following  bolus administration of intravenous contrast. Sagittal and coronal MPR images reconstructed from axial data set. CONTRAST:  25m OMNIPAQUE IOHEXOL 350 MG/ML SOLN IV. No oral contrast. COMPARISON:  10/24/2020 FINDINGS: Lower chest: Minimal bibasilar atelectasis Hepatobiliary: Gallbladder distended with small amount of adjacent fluid, though ascites is seen elsewhere in the upper abdomen as well. No calcifications. Mild intrahepatic and extrahepatic biliary dilatation, with CBD measuring 11 mm Pancreas: Parenchymal atrophy and ductal dilatation. Surrounding edema. Multiple fluid collections consistent with pseudocysts. Fluid collection anterior to proximal pancreatic tail 5.6 x 3.6 cm image 22. Reverse C-shaped collection inferior to pancreatic body and proximal tail 7.3 x 5.3 cm image 34. Collection adjacent to pancreatic body 3.2 x 2.3 cm image 28. Small fluid collection anteriorly in upper abdomen 3.7 x 1.8 cm image 28, potentially communicating with the reverse C-shaped collection. Spleen: Normal appearance Adrenals/Urinary Tract: Adrenal glands normal appearance. Small BILATERAL renal cysts. Large calculus at inferior pole RIGHT kidney 16 x 12 mm image 39. Kidneys, ureters, and bladder otherwise normal appearance. Stomach/Bowel: Appendix not visualized. Diverticulosis of distal colon without evidence of diverticulitis. Stomach decompressed. Bowel loops otherwise unremarkable. Vascular/Lymphatic: Atherosclerotic calcifications aorta and iliac arteries without aneurysm. Enlargement of cardiac chambers. No adenopathy. Reproductive: Prostate gland normal size. Seminal vesicles unremarkable. Other: Scattered ascites.  No free air.  No hernia. Musculoskeletal: Old BILATERAL pelvic fractures. Old proximal LEFT femoral fracture with deformity. IMPRESSION: Acute pancreatitis with multiple peripancreatic pseudocysts as above, majority new since the prior exam though these correspond in position with areas of  inflammation/phlegmon seen on the previous exam. Mild biliary dilatation with CBD measuring 11 mm, recommend correlation with LFTs. Distal colonic diverticulosis without evidence of diverticulitis. Nonobstructing RIGHT renal calculus 16 x 12 mm. Aortic Atherosclerosis (ICD10-I70.0). Electronically Signed   By: MLavonia DanaM.D.   On: 11/29/2020 11:38   MR 3D Recon At Scanner  Result Date: 11/29/2020 CLINICAL DATA:  70year old male with history of pancreatitis. Biliary ductal dilatation. EXAM: MRI ABDOMEN WITHOUT AND WITH CONTRAST (INCLUDING MRCP) TECHNIQUE: Multiplanar multisequence MR imaging of the abdomen was performed both before and after the administration of intravenous contrast. Heavily T2-weighted images of the biliary and pancreatic ducts were obtained, and three-dimensional MRCP images were rendered by post processing. CONTRAST:  117mGADAVIST GADOBUTROL 1 MMOL/ML IV SOLN COMPARISON:  No prior abdominal MRI. CT the abdomen and pelvis 11/29/2020. FINDINGS: Lower chest: Unremarkable. Hepatobiliary: No suspicious cystic or solid hepatic lesions. There is mild intrahepatic biliary ductal dilatation. Common hepatic duct is dilated measuring up to 15 mm in diameter. Common bile duct is mildly dilated measuring up to 8 mm in diameter. MRCP images are suboptimal but demonstrate a potential filling defect in the distal common bile duct estimated to measure approximately 5 mm (coronal image 56 of series 11). However, this cannot be confirmed on additional T2 weighted sequences. There are no filling defects within the gallbladder to suggest cholelithiasis. Gallbladder is moderately distended. Gallbladder wall thickness  appears normal. Pancreas: Pancreatic ductal dilatation is again noted throughout the body and tail of the pancreas, measuring up to 9 mm in the body. This ductal dilatation abruptly terminates in the region of the pancreatic head. In this region, no discrete pancreatic mass is confidently  identified on today's examination. There is atrophy in the distal body and tail of the pancreas. Several complex fluid collections emanate from the pancreas and extend into the root of the small bowel mesentery, demonstrating heterogeneous signal intensity on T1 and T2 weighted images, compatible with pancreatic pseudocysts. The largest of these is complex in shape and therefore difficult to measure, but estimated measure approximately 6.2 x 3.0 x 5.5 cm (axial image 24 of series 4 and coronal image 10 of series 5). Spleen:  Unremarkable. Adrenals/Urinary Tract: Multiple small T1 hypointense, T2 hyperintense, nonenhancing lesions are noted in both kidneys, compatible with simple cysts, measuring 1.3 cm or less in size. No definite solid renal lesions are noted. No hydroureteronephrosis in the visualized portions of the abdomen. Bilateral adrenal glands are normal in appearance. Stomach/Bowel: Unremarkable. Vascular/Lymphatic: No aneurysm identified in the visualized abdominal vasculature. Splenic vein, superior mesenteric vein, splenoportal confluence and proximal portal vein are all narrowed secondary to mass effect from the acute pancreatitis. Other:  Small volume of ascites most evident adjacent to the liver. Musculoskeletal: No aggressive appearing osseous lesions are noted in the visualized portions of the skeleton. IMPRESSION: 1. MRCP images are suboptimal, but suggest the possibility of choledocholithiasis in the distal common bile duct. Notably, however, this could not be confirmed on additional T2 weighted sequences, and would not explain the persistent pancreatic ductal dilatation seen on today's examination. The possibility of a pancreatic head mass is considered, although no discrete mass is confidently identified on today's examination which is confounded by the presence of acute pancreatitis with multiple complex pseudocysts, as discussed above. Electronically Signed   By: Vinnie Langton M.D.   On:  11/29/2020 20:59   DG Chest Port 1 View  Result Date: 11/29/2020 CLINICAL DATA:  Cough, dyspnea, abdominal pain, pancreatitis EXAM: PORTABLE CHEST 1 VIEW COMPARISON:  11/27/2020 chest radiograph. FINDINGS: Stable cardiomediastinal silhouette with mild cardiomegaly. No pneumothorax. No pleural effusion. Lungs appear clear, with no acute consolidative airspace disease and no pulmonary edema. IMPRESSION: Mild cardiomegaly without pulmonary edema. No active pulmonary disease. Electronically Signed   By: Ilona Sorrel M.D.   On: 11/29/2020 12:26   MR ABDOMEN MRCP W WO CONTAST  Result Date: 11/29/2020 CLINICAL DATA:  70 year old male with history of pancreatitis. Biliary ductal dilatation. EXAM: MRI ABDOMEN WITHOUT AND WITH CONTRAST (INCLUDING MRCP) TECHNIQUE: Multiplanar multisequence MR imaging of the abdomen was performed both before and after the administration of intravenous contrast. Heavily T2-weighted images of the biliary and pancreatic ducts were obtained, and three-dimensional MRCP images were rendered by post processing. CONTRAST:  13m GADAVIST GADOBUTROL 1 MMOL/ML IV SOLN COMPARISON:  No prior abdominal MRI. CT the abdomen and pelvis 11/29/2020. FINDINGS: Lower chest: Unremarkable. Hepatobiliary: No suspicious cystic or solid hepatic lesions. There is mild intrahepatic biliary ductal dilatation. Common hepatic duct is dilated measuring up to 15 mm in diameter. Common bile duct is mildly dilated measuring up to 8 mm in diameter. MRCP images are suboptimal but demonstrate a potential filling defect in the distal common bile duct estimated to measure approximately 5 mm (coronal image 56 of series 11). However, this cannot be confirmed on additional T2 weighted sequences. There are no filling defects within the gallbladder to suggest cholelithiasis.  Gallbladder is moderately distended. Gallbladder wall thickness appears normal. Pancreas: Pancreatic ductal dilatation is again noted throughout the body and  tail of the pancreas, measuring up to 9 mm in the body. This ductal dilatation abruptly terminates in the region of the pancreatic head. In this region, no discrete pancreatic mass is confidently identified on today's examination. There is atrophy in the distal body and tail of the pancreas. Several complex fluid collections emanate from the pancreas and extend into the root of the small bowel mesentery, demonstrating heterogeneous signal intensity on T1 and T2 weighted images, compatible with pancreatic pseudocysts. The largest of these is complex in shape and therefore difficult to measure, but estimated measure approximately 6.2 x 3.0 x 5.5 cm (axial image 24 of series 4 and coronal image 10 of series 5). Spleen:  Unremarkable. Adrenals/Urinary Tract: Multiple small T1 hypointense, T2 hyperintense, nonenhancing lesions are noted in both kidneys, compatible with simple cysts, measuring 1.3 cm or less in size. No definite solid renal lesions are noted. No hydroureteronephrosis in the visualized portions of the abdomen. Bilateral adrenal glands are normal in appearance. Stomach/Bowel: Unremarkable. Vascular/Lymphatic: No aneurysm identified in the visualized abdominal vasculature. Splenic vein, superior mesenteric vein, splenoportal confluence and proximal portal vein are all narrowed secondary to mass effect from the acute pancreatitis. Other:  Small volume of ascites most evident adjacent to the liver. Musculoskeletal: No aggressive appearing osseous lesions are noted in the visualized portions of the skeleton. IMPRESSION: 1. MRCP images are suboptimal, but suggest the possibility of choledocholithiasis in the distal common bile duct. Notably, however, this could not be confirmed on additional T2 weighted sequences, and would not explain the persistent pancreatic ductal dilatation seen on today's examination. The possibility of a pancreatic head mass is considered, although no discrete mass is confidently  identified on today's examination which is confounded by the presence of acute pancreatitis with multiple complex pseudocysts, as discussed above. Electronically Signed   By: Vinnie Langton M.D.   On: 11/29/2020 20:59   ECHOCARDIOGRAM COMPLETE  Result Date: 11/29/2020    ECHOCARDIOGRAM REPORT   Patient Name:   Ryan [name] Date of Exam: 11/29/2020 Medical Rec #:  [id number]       Height:       71.0 in Accession #:    [id number]      Weight:       188.0 lb Date of Birth:  1950-10-29       BSA:          2.054 m Patient Age:    37 years        BP:           148/83 mmHg Patient Gender: M               HR:           98 bpm. Exam Location:  [facility] Procedure: 2D Echo, Cardiac Doppler and Color Doppler Indications:    Cardiomyopathy  History:        Patient has prior history of Echocardiogram examinations, most                 recent 10/16/2018. CHF, COPD; Risk Factors:Hypertension and                 Dyslipidemia. Nonischemic cardiomyopathy, Tobacco abuse.  Sonographer:    Wenda Low Referring Phys: [id number] ANKIT CHIRAG AMIN IMPRESSIONS  1. Global hypokinesis. The anterior, anteroseptal walls are akinetic. Marland Kitchen Left ventricular ejection fraction, by estimation, is  15-20%. The left ventricle has severely decreased function. The left ventricle demonstrates global hypokinesis. The left ventricular internal cavity size was severely dilated. Left ventricular diastolic parameters are indeterminate.  2. Right ventricular systolic function is normal. The right ventricular size is normal.  3. Left atrial size was severely dilated.  4. The mitral valve is normal in structure. Trivial mitral valve regurgitation. No evidence of mitral stenosis.  5. The aortic valve is tricuspid. Aortic valve regurgitation is not visualized. No aortic stenosis is present. FINDINGS  Left Ventricle: Global hypokinesis. The anterior, anteroseptal walls are akinetic. Left ventricular ejection fraction, by estimation, is 15-20%. The left  ventricle has severely decreased function. The left ventricle demonstrates global hypokinesis. The left ventricular internal cavity size was severely dilated. There is no left ventricular hypertrophy. Left ventricular diastolic parameters are indeterminate. Right Ventricle: The right ventricular size is normal. No increase in right ventricular wall thickness. Right ventricular systolic function is normal. Left Atrium: Left atrial size was severely dilated. Right Atrium: Right atrial size was normal in size. Pericardium: There is no evidence of pericardial effusion. Mitral Valve: The mitral valve is normal in structure. Trivial mitral valve regurgitation. No evidence of mitral valve stenosis. MV peak gradient, 3.1 mmHg. The mean mitral valve gradient is 1.0 mmHg. Tricuspid Valve: The tricuspid valve is not well visualized. Tricuspid valve regurgitation is not demonstrated. No evidence of tricuspid stenosis. Aortic Valve: The aortic valve is tricuspid. Aortic valve regurgitation is not visualized. No aortic stenosis is present. Aortic valve mean gradient measures 3.0 mmHg. Aortic valve peak gradient measures 4.8 mmHg. Aortic valve area, by VTI measures 3.28 cm. Pulmonic Valve: The pulmonic valve was not well visualized. Pulmonic valve regurgitation is not visualized. No evidence of pulmonic stenosis. Aorta: The aortic root is normal in size and structure. IAS/Shunts: The interatrial septum was not well visualized.  LEFT VENTRICLE PLAX 2D LVIDd:         7.00 cm      Diastology LVIDs:         6.50 cm      LV e' medial:    4.68 cm/s LV PW:         1.10 cm      LV E/e' medial:  17.8 LV IVS:        0.90 cm      LV e' lateral:   10.40 cm/s LVOT diam:     2.00 cm      LV E/e' lateral: 8.0 LV SV:         58 LV SV Index:   28 LVOT Area:     3.14 cm  LV Volumes (MOD) LV vol d, MOD A2C: 288.0 ml LV vol d, MOD A4C: 275.0 ml LV vol s, MOD A2C: 238.0 ml LV vol s, MOD A4C: 199.0 ml LV SV MOD A2C:     50.0 ml LV SV MOD A4C:      275.0 ml LV SV MOD BP:      62.3 ml RIGHT VENTRICLE RV Basal diam:  3.55 cm RV Mid diam:    3.10 cm RV S prime:     13.50 cm/s TAPSE (M-mode): 1.8 cm LEFT ATRIUM              Index       RIGHT ATRIUM           Index LA diam:        4.30 cm  2.09 cm/m  RA Area:  19.30 cm LA Vol (A2C):   113.0 ml 55.02 ml/m RA Volume:   54.80 ml  26.68 ml/m LA Vol (A4C):   126.0 ml 61.35 ml/m LA Biplane Vol: 124.0 ml 60.37 ml/m  AORTIC VALVE                   PULMONIC VALVE AV Area (Vmax):    3.23 cm    PV Vmax:       0.80 m/s AV Area (Vmean):   2.89 cm    PV Peak grad:  2.6 mmHg AV Area (VTI):     3.28 cm AV Vmax:           109.00 cm/s AV Vmean:          74.400 cm/s AV VTI:            0.176 m AV Peak Grad:      4.8 mmHg AV Mean Grad:      3.0 mmHg LVOT Vmax:         112.00 cm/s LVOT Vmean:        68.400 cm/s LVOT VTI:          0.184 m LVOT/AV VTI ratio: 1.05  AORTA Ao Root diam: 3.60 cm Ao Asc diam:  3.50 cm MITRAL VALVE MV Area (PHT): 5.42 cm    SHUNTS MV Area VTI:   4.62 cm    Systemic VTI:  0.18 m MV Peak grad:  3.1 mmHg    Systemic Diam: 2.00 cm MV Mean grad:  1.0 mmHg MV Vmax:       0.88 m/s MV Vmean:      52.5 cm/s MV Decel Time: 140 msec MV E velocity: 83.10 cm/s MV A velocity: 62.10 cm/s MV E/A ratio:  1.34 Carlyle Dolly MD Electronically signed by Carlyle Dolly MD Signature Date/Time: 11/29/2020/4:25:37 PM    Final         Discharge Exam: Vitals:   12/07/20 0607 12/07/20 0716  BP: (!) 136/92   Pulse: 100   Resp: 19   Temp: 98.7 F (37.1 C)   SpO2: 100% 100%   Vitals:   12/06/20 1944 12/07/20 0607 12/07/20 0714 12/07/20 0716  BP:  (!) 136/92    Pulse:  100    Resp:  19    Temp:  98.7 F (37.1 C)    TempSrc:  Oral    SpO2: 99% 100%  100%  Weight:   89.3 kg   Height:        General: Pt is alert, awake, not in acute distress Cardiovascular: RRR, S1/S2 +, no rubs, no gallops Respiratory: diminished BS, bibasilar rales. No wheeze Abdominal: Soft, NT, ND, bowel sounds + Extremities:  no edema, no cyanosis   The results of significant diagnostics from this hospitalization (including imaging, microbiology, ancillary and laboratory) are listed below for reference.    Significant Diagnostic Studies: DG Chest 2 View  Result Date: 11/27/2020 CLINICAL DATA:  Productive cough and shortness of breath. EXAM: CHEST - 2 VIEW COMPARISON:  10/26/2020 FINDINGS: 0949 hours. Low volume film. The cardio pericardial silhouette is enlarged. Interstitial markings are diffusely coarsened with chronic features. Atelectasis or linear scarring noted right mid lung. Telemetry leads overlie the chest. IMPRESSION: Low volume film without acute cardiopulmonary findings. Electronically Signed   By: Misty Stanley M.D.   On: 11/27/2020 10:51   DG Abd 1 View  Result Date: 12/01/2020 CLINICAL DATA:  Abdominal pain and history of pancreatitis EXAM: ABDOMEN - 1 VIEW COMPARISON:  CT from 11/29/2020 FINDINGS: Scattered large and small bowel gas is noted. Multiple edematous loops of small bowel are noted without significant gas within. No true obstructive changes are seen. No free air is noted. Nonobstructing right renal stone is noted stable in appearance from the prior exam. Chronic changes about the proximal left hip are seen. Degenerative changes of lumbar spine are noted. IMPRESSION: Edematous loops of small bowel likely related to the underlying pancreatitis. No true obstructive changes are seen at this time. Stable right renal stone. Electronically Signed   By: Inez Catalina M.D.   On: 12/01/2020 15:08   CT Abdomen Pelvis W Contrast  Result Date: 11/29/2020 CLINICAL DATA:  Persistent pancreatitis EXAM: CT ABDOMEN AND PELVIS WITH CONTRAST TECHNIQUE: Multidetector CT imaging of the abdomen and pelvis was performed using the standard protocol following bolus administration of intravenous contrast. Sagittal and coronal MPR images reconstructed from axial data set. CONTRAST:  24m OMNIPAQUE IOHEXOL 350 MG/ML SOLN IV.  No oral contrast. COMPARISON:  10/24/2020 FINDINGS: Lower chest: Minimal bibasilar atelectasis Hepatobiliary: Gallbladder distended with small amount of adjacent fluid, though ascites is seen elsewhere in the upper abdomen as well. No calcifications. Mild intrahepatic and extrahepatic biliary dilatation, with CBD measuring 11 mm Pancreas: Parenchymal atrophy and ductal dilatation. Surrounding edema. Multiple fluid collections consistent with pseudocysts. Fluid collection anterior to proximal pancreatic tail 5.6 x 3.6 cm image 22. Reverse C-shaped collection inferior to pancreatic body and proximal tail 7.3 x 5.3 cm image 34. Collection adjacent to pancreatic body 3.2 x 2.3 cm image 28. Small fluid collection anteriorly in upper abdomen 3.7 x 1.8 cm image 28, potentially communicating with the reverse C-shaped collection. Spleen: Normal appearance Adrenals/Urinary Tract: Adrenal glands normal appearance. Small BILATERAL renal cysts. Large calculus at inferior pole RIGHT kidney 16 x 12 mm image 39. Kidneys, ureters, and bladder otherwise normal appearance. Stomach/Bowel: Appendix not visualized. Diverticulosis of distal colon without evidence of diverticulitis. Stomach decompressed. Bowel loops otherwise unremarkable. Vascular/Lymphatic: Atherosclerotic calcifications aorta and iliac arteries without aneurysm. Enlargement of cardiac chambers. No adenopathy. Reproductive: Prostate gland normal size. Seminal vesicles unremarkable. Other: Scattered ascites.  No free air.  No hernia. Musculoskeletal: Old BILATERAL pelvic fractures. Old proximal LEFT femoral fracture with deformity. IMPRESSION: Acute pancreatitis with multiple peripancreatic pseudocysts as above, majority new since the prior exam though these correspond in position with areas of inflammation/phlegmon seen on the previous exam. Mild biliary dilatation with CBD measuring 11 mm, recommend correlation with LFTs. Distal colonic diverticulosis without evidence  of diverticulitis. Nonobstructing RIGHT renal calculus 16 x 12 mm. Aortic Atherosclerosis (ICD10-I70.0). Electronically Signed   By: MLavonia DanaM.D.   On: 11/29/2020 11:38   MR 3D Recon At Scanner  Result Date: 11/29/2020 CLINICAL DATA:  70year old male with history of pancreatitis. Biliary ductal dilatation. EXAM: MRI ABDOMEN WITHOUT AND WITH CONTRAST (INCLUDING MRCP) TECHNIQUE: Multiplanar multisequence MR imaging of the abdomen was performed both before and after the administration of intravenous contrast. Heavily T2-weighted images of the biliary and pancreatic ducts were obtained, and three-dimensional MRCP images were rendered by post processing. CONTRAST:  134mGADAVIST GADOBUTROL 1 MMOL/ML IV SOLN COMPARISON:  No prior abdominal MRI. CT the abdomen and pelvis 11/29/2020. FINDINGS: Lower chest: Unremarkable. Hepatobiliary: No suspicious cystic or solid hepatic lesions. There is mild intrahepatic biliary ductal dilatation. Common hepatic duct is dilated measuring up to 15 mm in diameter. Common bile duct is mildly dilated measuring up to 8 mm in diameter. MRCP images are suboptimal but demonstrate a potential  filling defect in the distal common bile duct estimated to measure approximately 5 mm (coronal image 56 of series 11). However, this cannot be confirmed on additional T2 weighted sequences. There are no filling defects within the gallbladder to suggest cholelithiasis. Gallbladder is moderately distended. Gallbladder wall thickness appears normal. Pancreas: Pancreatic ductal dilatation is again noted throughout the body and tail of the pancreas, measuring up to 9 mm in the body. This ductal dilatation abruptly terminates in the region of the pancreatic head. In this region, no discrete pancreatic mass is confidently identified on today's examination. There is atrophy in the distal body and tail of the pancreas. Several complex fluid collections emanate from the pancreas and extend into the root of  the small bowel mesentery, demonstrating heterogeneous signal intensity on T1 and T2 weighted images, compatible with pancreatic pseudocysts. The largest of these is complex in shape and therefore difficult to measure, but estimated measure approximately 6.2 x 3.0 x 5.5 cm (axial image 24 of series 4 and coronal image 10 of series 5). Spleen:  Unremarkable. Adrenals/Urinary Tract: Multiple small T1 hypointense, T2 hyperintense, nonenhancing lesions are noted in both kidneys, compatible with simple cysts, measuring 1.3 cm or less in size. No definite solid renal lesions are noted. No hydroureteronephrosis in the visualized portions of the abdomen. Bilateral adrenal glands are normal in appearance. Stomach/Bowel: Unremarkable. Vascular/Lymphatic: No aneurysm identified in the visualized abdominal vasculature. Splenic vein, superior mesenteric vein, splenoportal confluence and proximal portal vein are all narrowed secondary to mass effect from the acute pancreatitis. Other:  Small volume of ascites most evident adjacent to the liver. Musculoskeletal: No aggressive appearing osseous lesions are noted in the visualized portions of the skeleton. IMPRESSION: 1. MRCP images are suboptimal, but suggest the possibility of choledocholithiasis in the distal common bile duct. Notably, however, this could not be confirmed on additional T2 weighted sequences, and would not explain the persistent pancreatic ductal dilatation seen on today's examination. The possibility of a pancreatic head mass is considered, although no discrete mass is confidently identified on today's examination which is confounded by the presence of acute pancreatitis with multiple complex pseudocysts, as discussed above. Electronically Signed   By: Vinnie Langton M.D.   On: 11/29/2020 20:59   DG Chest Port 1 View  Result Date: 11/29/2020 CLINICAL DATA:  Cough, dyspnea, abdominal pain, pancreatitis EXAM: PORTABLE CHEST 1 VIEW COMPARISON:  11/27/2020  chest radiograph. FINDINGS: Stable cardiomediastinal silhouette with mild cardiomegaly. No pneumothorax. No pleural effusion. Lungs appear clear, with no acute consolidative airspace disease and no pulmonary edema. IMPRESSION: Mild cardiomegaly without pulmonary edema. No active pulmonary disease. Electronically Signed   By: Ilona Sorrel M.D.   On: 11/29/2020 12:26   MR ABDOMEN MRCP W WO CONTAST  Result Date: 11/29/2020 CLINICAL DATA:  69 year old male with history of pancreatitis. Biliary ductal dilatation. EXAM: MRI ABDOMEN WITHOUT AND WITH CONTRAST (INCLUDING MRCP) TECHNIQUE: Multiplanar multisequence MR imaging of the abdomen was performed both before and after the administration of intravenous contrast. Heavily T2-weighted images of the biliary and pancreatic ducts were obtained, and three-dimensional MRCP images were rendered by post processing. CONTRAST:  44m GADAVIST GADOBUTROL 1 MMOL/ML IV SOLN COMPARISON:  No prior abdominal MRI. CT the abdomen and pelvis 11/29/2020. FINDINGS: Lower chest: Unremarkable. Hepatobiliary: No suspicious cystic or solid hepatic lesions. There is mild intrahepatic biliary ductal dilatation. Common hepatic duct is dilated measuring up to 15 mm in diameter. Common bile duct is mildly dilated measuring up to 8 mm in diameter. MRCP  images are suboptimal but demonstrate a potential filling defect in the distal common bile duct estimated to measure approximately 5 mm (coronal image 56 of series 11). However, this cannot be confirmed on additional T2 weighted sequences. There are no filling defects within the gallbladder to suggest cholelithiasis. Gallbladder is moderately distended. Gallbladder wall thickness appears normal. Pancreas: Pancreatic ductal dilatation is again noted throughout the body and tail of the pancreas, measuring up to 9 mm in the body. This ductal dilatation abruptly terminates in the region of the pancreatic head. In this region, no discrete pancreatic mass  is confidently identified on today's examination. There is atrophy in the distal body and tail of the pancreas. Several complex fluid collections emanate from the pancreas and extend into the root of the small bowel mesentery, demonstrating heterogeneous signal intensity on T1 and T2 weighted images, compatible with pancreatic pseudocysts. The largest of these is complex in shape and therefore difficult to measure, but estimated measure approximately 6.2 x 3.0 x 5.5 cm (axial image 24 of series 4 and coronal image 10 of series 5). Spleen:  Unremarkable. Adrenals/Urinary Tract: Multiple small T1 hypointense, T2 hyperintense, nonenhancing lesions are noted in both kidneys, compatible with simple cysts, measuring 1.3 cm or less in size. No definite solid renal lesions are noted. No hydroureteronephrosis in the visualized portions of the abdomen. Bilateral adrenal glands are normal in appearance. Stomach/Bowel: Unremarkable. Vascular/Lymphatic: No aneurysm identified in the visualized abdominal vasculature. Splenic vein, superior mesenteric vein, splenoportal confluence and proximal portal vein are all narrowed secondary to mass effect from the acute pancreatitis. Other:  Small volume of ascites most evident adjacent to the liver. Musculoskeletal: No aggressive appearing osseous lesions are noted in the visualized portions of the skeleton. IMPRESSION: 1. MRCP images are suboptimal, but suggest the possibility of choledocholithiasis in the distal common bile duct. Notably, however, this could not be confirmed on additional T2 weighted sequences, and would not explain the persistent pancreatic ductal dilatation seen on today's examination. The possibility of a pancreatic head mass is considered, although no discrete mass is confidently identified on today's examination which is confounded by the presence of acute pancreatitis with multiple complex pseudocysts, as discussed above. Electronically Signed   By: Vinnie Langton M.D.   On: 11/29/2020 20:59   ECHOCARDIOGRAM COMPLETE  Result Date: 11/29/2020    ECHOCARDIOGRAM REPORT   Patient Name:   Ryan [name] Date of Exam: 11/29/2020 Medical Rec #:  [id number]       Height:       71.0 in Accession #:    [id number]      Weight:       188.0 lb Date of Birth:  [birth year]       BSA:          2.054 m Patient Age:    58 years        BP:           148/83 mmHg Patient Gender: M               HR:           98 bpm. Exam Location:  [facility] Procedure: 2D Echo, Cardiac Doppler and Color Doppler Indications:    Cardiomyopathy  History:        Patient has prior history of Echocardiogram examinations, most                 recent 10/16/2018. CHF, COPD; Risk Factors:Hypertension and  Dyslipidemia. Nonischemic cardiomyopathy, Tobacco abuse.  Sonographer:    Wenda Low Referring Phys: [id number] ANKIT CHIRAG AMIN IMPRESSIONS  1. Global hypokinesis. The anterior, anteroseptal walls are akinetic. Marland Kitchen Left ventricular ejection fraction, by estimation, is 15-20%. The left ventricle has severely decreased function. The left ventricle demonstrates global hypokinesis. The left ventricular internal cavity size was severely dilated. Left ventricular diastolic parameters are indeterminate.  2. Right ventricular systolic function is normal. The right ventricular size is normal.  3. Left atrial size was severely dilated.  4. The mitral valve is normal in structure. Trivial mitral valve regurgitation. No evidence of mitral stenosis.  5. The aortic valve is tricuspid. Aortic valve regurgitation is not visualized. No aortic stenosis is present. FINDINGS  Left Ventricle: Global hypokinesis. The anterior, anteroseptal walls are akinetic. Left ventricular ejection fraction, by estimation, is 15-20%. The left ventricle has severely decreased function. The left ventricle demonstrates global hypokinesis. The left ventricular internal cavity size was severely dilated. There is no left ventricular  hypertrophy. Left ventricular diastolic parameters are indeterminate. Right Ventricle: The right ventricular size is normal. No increase in right ventricular wall thickness. Right ventricular systolic function is normal. Left Atrium: Left atrial size was severely dilated. Right Atrium: Right atrial size was normal in size. Pericardium: There is no evidence of pericardial effusion. Mitral Valve: The mitral valve is normal in structure. Trivial mitral valve regurgitation. No evidence of mitral valve stenosis. MV peak gradient, 3.1 mmHg. The mean mitral valve gradient is 1.0 mmHg. Tricuspid Valve: The tricuspid valve is not well visualized. Tricuspid valve regurgitation is not demonstrated. No evidence of tricuspid stenosis. Aortic Valve: The aortic valve is tricuspid. Aortic valve regurgitation is not visualized. No aortic stenosis is present. Aortic valve mean gradient measures 3.0 mmHg. Aortic valve peak gradient measures 4.8 mmHg. Aortic valve area, by VTI measures 3.28 cm. Pulmonic Valve: The pulmonic valve was not well visualized. Pulmonic valve regurgitation is not visualized. No evidence of pulmonic stenosis. Aorta: The aortic root is normal in size and structure. IAS/Shunts: The interatrial septum was not well visualized.  LEFT VENTRICLE PLAX 2D LVIDd:         7.00 cm      Diastology LVIDs:         6.50 cm      LV e' medial:    4.68 cm/s LV PW:         1.10 cm      LV E/e' medial:  17.8 LV IVS:        0.90 cm      LV e' lateral:   10.40 cm/s LVOT diam:     2.00 cm      LV E/e' lateral: 8.0 LV SV:         58 LV SV Index:   28 LVOT Area:     3.14 cm  LV Volumes (MOD) LV vol d, MOD A2C: 288.0 ml LV vol d, MOD A4C: 275.0 ml LV vol s, MOD A2C: 238.0 ml LV vol s, MOD A4C: 199.0 ml LV SV MOD A2C:     50.0 ml LV SV MOD A4C:     275.0 ml LV SV MOD BP:      62.3 ml RIGHT VENTRICLE RV Basal diam:  3.55 cm RV Mid diam:    3.10 cm RV S prime:     13.50 cm/s TAPSE (M-mode): 1.8 cm LEFT ATRIUM              Index  RIGHT ATRIUM           Index LA diam:        4.30 cm  2.09 cm/m  RA Area:     19.30 cm LA Vol (A2C):   113.0 ml 55.02 ml/m RA Volume:   54.80 ml  26.68 ml/m LA Vol (A4C):   126.0 ml 61.35 ml/m LA Biplane Vol: 124.0 ml 60.37 ml/m  AORTIC VALVE                   PULMONIC VALVE AV Area (Vmax):    3.23 cm    PV Vmax:       0.80 m/s AV Area (Vmean):   2.89 cm    PV Peak grad:  2.6 mmHg AV Area (VTI):     3.28 cm AV Vmax:           109.00 cm/s AV Vmean:          74.400 cm/s AV VTI:            0.176 m AV Peak Grad:      4.8 mmHg AV Mean Grad:      3.0 mmHg LVOT Vmax:         112.00 cm/s LVOT Vmean:        68.400 cm/s LVOT VTI:          0.184 m LVOT/AV VTI ratio: 1.05  AORTA Ao Root diam: 3.60 cm Ao Asc diam:  3.50 cm MITRAL VALVE MV Area (PHT): 5.42 cm    SHUNTS MV Area VTI:   4.62 cm    Systemic VTI:  0.18 m MV Peak grad:  3.1 mmHg    Systemic Diam: 2.00 cm MV Mean grad:  1.0 mmHg MV Vmax:       0.88 m/s MV Vmean:      52.5 cm/s MV Decel Time: 140 msec MV E velocity: 83.10 cm/s MV A velocity: 62.10 cm/s MV E/A ratio:  1.34 Carlyle Dolly MD Electronically signed by Carlyle Dolly MD Signature Date/Time: 11/29/2020/4:25:37 PM    Final     Microbiology: Recent Results (from the past 240 hour(s))  Urine Culture     Status: Abnormal   Collection Time: 11/29/20 12:00 PM   Specimen: Urine, Clean Catch  Result Value Ref Range Status   Specimen Description   Final    URINE, CLEAN CATCH Performed at [facility], [address], [place], [place] [address]    Special Requests   Final    NONE Performed at [facility], [address], [place], [place] [address]    Culture MULTIPLE SPECIES PRESENT, SUGGEST RECOLLECTION (A)  Final   Report Status 12/01/2020 FINAL  Final  Resp Panel by RT-PCR (Flu A&B, Covid)     Status: None   Collection Time: 11/29/20 12:08 PM   Specimen: Nasopharyngeal(NP) swabs in vial transport medium  Result Value Ref Range Status   SARS Coronavirus 2 by RT PCR NEGATIVE NEGATIVE  Final    Comment: (NOTE) SARS-CoV-2 target nucleic acids are NOT DETECTED.  The SARS-CoV-2 RNA is generally detectable in upper respiratory specimens during the acute phase of infection. The lowest concentration of SARS-CoV-2 viral copies this assay can detect is 138 copies/mL. A negative result does not preclude SARS-Cov-2 infection and should not be used as the sole basis for treatment or other patient management decisions. A negative result may occur with  improper specimen collection/handling, submission of specimen other than nasopharyngeal swab, presence of viral mutation(s) within the  areas targeted by this assay, and inadequate number of viral copies(<138 copies/mL). A negative result must be combined with clinical observations, patient history, and epidemiological information. The expected result is Negative.  Fact Sheet for Patients:  EntrepreneurPulse.com.au  Fact Sheet for Healthcare Providers:  IncredibleEmployment.be  This test is no t yet approved or cleared by the [place] FDA and  has been authorized for detection and/or diagnosis of SARS-CoV-2 by FDA under an Emergency Use Authorization (EUA). This EUA will remain  in effect (meaning this test can be used) for the duration of the COVID-19 declaration under Section 564(b)(1) of the Act, 21 U.S.C.section 360bbb-3(b)(1), unless the authorization is terminated  or revoked sooner.       Influenza A by PCR NEGATIVE NEGATIVE Final   Influenza B by PCR NEGATIVE NEGATIVE Final    Comment: (NOTE) The Xpert Xpress SARS-CoV-2/FLU/RSV plus assay is intended as an aid in the diagnosis of influenza from Nasopharyngeal swab specimens and should not be used as a sole basis for treatment. Nasal washings and aspirates are unacceptable for Xpert Xpress SARS-CoV-2/FLU/RSV testing.  Fact Sheet for Patients: EntrepreneurPulse.com.au  Fact Sheet for Healthcare  Providers: IncredibleEmployment.be  This test is not yet approved or cleared by the [place] FDA and has been authorized for detection and/or diagnosis of SARS-CoV-2 by FDA under an Emergency Use Authorization (EUA). This EUA will remain in effect (meaning this test can be used) for the duration of the COVID-19 declaration under Section 564(b)(1) of the Act, 21 U.S.C. section 360bbb-3(b)(1), unless the authorization is terminated or revoked.  Performed at [facility], [address], [place], [place] [address]      Labs: Basic Metabolic Panel: Recent Labs  Lab 12/01/20 0456 12/02/20 0518 12/03/20 0514 12/04/20 0455 12/05/20 0459 12/06/20 0510  NA 136 137  --  138 142 140  K 3.9 3.7  --  4.0 3.7 4.2  CL 102 102  --  106 106 106  CO2 25 27  --  '24 28 25  ' GLUCOSE 126* 110*  --  123* 90 159*  BUN 9 11  --  '17 17 19  ' CREATININE 0.76 0.82  --  0.81 0.94 1.17  CALCIUM 8.3* 8.3*  --  8.5* 8.7* 8.5*  MG 2.2 2.3 2.1 2.2 2.2  --    Liver Function Tests: Recent Labs  Lab 12/01/20 0456 12/02/20 0518 12/04/20 0455 12/05/20 0459 12/06/20 0510  AST 29 44* 48* 59* 58*  ALT 15 33 50* 64* 75*  ALKPHOS 150* 151* 180* 188* 188*  BILITOT 0.1* 0.1* 0.1* 0.4 0.3  PROT 6.4* 6.5 6.0* 6.2* 5.9*  ALBUMIN 2.5* 2.6* 2.5* 2.8* 2.5*   Recent Labs  Lab 12/02/20 0518 12/04/20 0455 12/06/20 0510  LIPASE 131* 94* 61*   No results for input(s): AMMONIA in the last 168 hours. CBC: Recent Labs  Lab 12/01/20 0456 12/01/20 0722 12/02/20 0518 12/03/20 0514  WBC 10.2  --  10.7* 10.4  HGB 7.1* 10.1* 9.9* 9.7*  HCT 22.4* 30.9* 30.6* 29.6*  MCV 98.2  --  94.4 93.4  PLT 240  --  319 313   Cardiac Enzymes: No results for input(s): CKTOTAL, CKMB, CKMBINDEX, TROPONINI in the last 168 hours. BNP: Invalid input(s): POCBNP CBG: No results for input(s): GLUCAP in the last 168 hours.  Time coordinating discharge:  36 minutes  Signed:  Orson Eva, DO [facility] Pager: [phone] 12/07/2020, 11:56 AM

## 2020-12-07 NOTE — Progress Notes (Signed)
Nsg Discharge Note  Admit Date:  11/29/2020 Discharge date: 12/07/2020   Ryan [name] to be D/C'd Home per MD order.  AVS completed.  Copy for chart, and copy for patient signed, and dated. Patient/caregiver able to verbalize understanding.  Discharge Medication: Allergies as of 12/07/2020       Reactions   Tape Other (See Comments)   "TAPE TEARS OFF MY SKIN"        Medication List     STOP taking these medications    oxyCODONE 5 MG immediate release tablet Commonly known as: Oxy IR/ROXICODONE   oxyCODONE-acetaminophen 10-325 MG tablet Commonly known as: Percocet Replaced by: oxyCODONE-acetaminophen 5-325 MG tablet   senna-docusate 8.6-50 MG tablet Commonly known as: Senokot-S       TAKE these medications    acetaminophen 325 MG tablet Commonly known as: TYLENOL Take 650 mg by mouth every 6 (six) hours as needed for mild pain.   albuterol 108 (90 Base) MCG/ACT inhaler Commonly known as: VENTOLIN HFA Inhale 2 puffs into the lungs every 6 (six) hours as needed for wheezing or shortness of breath.   bismuth subsalicylate 335 KT/62BW suspension Commonly known as: PEPTO BISMOL Take 30 mLs by mouth every 6 (six) hours as needed for indigestion.   carvedilol 6.25 MG tablet Commonly known as: COREG Take 3.125 mg by mouth once a week.   digoxin 0.125 MG tablet Commonly known as: LANOXIN Take 0.125 mg by mouth daily as needed (blood pressure).   furosemide 40 MG tablet Commonly known as: LASIX Take 1 tablet (40 mg total) by mouth 2 (two) times daily for 4 days. What changed: Another medication with the same name was removed. Continue taking this medication, and follow the directions you see here.   gabapentin 300 MG capsule Commonly known as: NEURONTIN Take 300 mg by mouth 2 (two) times daily.   guaiFENesin 100 MG/5ML liquid Commonly known as: ROBITUSSIN Take 200 mg by mouth every 12 (twelve) hours as needed for cough.   ivabradine 5 MG Tabs  tablet Commonly known as: CORLANOR Take 1 tablet (5 mg total) by mouth 2 (two) times daily with a meal. What changed:  when to take this reasons to take this   loratadine 10 MG tablet Commonly known as: CLARITIN Take 10 mg by mouth at bedtime.   Multi-Vitamin tablet Take 1 tablet by mouth daily.   naloxone 4 MG/0.1ML Liqd nasal spray kit Commonly known as: NARCAN Place 1 spray into the nose once as needed (overdose).   oxyCODONE-acetaminophen 5-325 MG tablet Commonly known as: PERCOCET/ROXICET Take 2 tablets by mouth every 8 (eight) hours as needed for severe pain. Replaces: oxyCODONE-acetaminophen 10-325 MG tablet   Pancrelipase (Lip-Prot-Amyl) 24000-76000 units Cpep Take 1 capsule (24,000 Units total) by mouth 3 (three) times daily before meals.   pantoprazole 40 MG tablet Commonly known as: PROTONIX Take 1 tablet (40 mg total) by mouth daily. What changed:  when to take this reasons to take this   polyethylene glycol 17 g packet Commonly known as: MIRALAX / GLYCOLAX Take 17 g by mouth 2 (two) times daily. What changed:  when to take this reasons to take this   predniSONE 10 MG tablet Commonly known as: DELTASONE Take 6 tablets (60 mg total) by mouth daily with breakfast. And decrease by one tablet daily Start taking on: December 08, 2020   sacubitril-valsartan 24-26 MG Commonly known as: ENTRESTO Take 1 tablet by mouth 2 (two) times daily.  Discharge Assessment: Vitals:   12/07/20 0716 12/07/20 1352  BP:  (!) 126/91  Pulse:  95  Resp:  18  Temp:  97.9 F (36.6 C)  SpO2: 100% 100%   Skin clean, dry and intact without evidence of skin break down, no evidence of skin tears noted. IV catheter discontinued intact. Site without signs and symptoms of complications - no redness or edema noted at insertion site, patient denies c/o pain - only slight tenderness at site.  Dressing with slight pressure applied.  D/c Instructions-Education: Discharge  instructions given to patient/family with verbalized understanding. D/c education completed with patient/family including follow up instructions, medication list, d/c activities limitations if indicated, with other d/c instructions as indicated by MD - patient able to verbalize understanding, all questions fully answered. Patient instructed to return to ED, call 911, or call MD for any changes in condition.  Patient escorted via [place], and D/C home via private auto.  Clovis Fredrickson, LPN 1/91/6606 0:04 PM

## 2020-12-08 NOTE — Telephone Encounter (Signed)
Per dr Laural Golden - pt needs liver function test on 12/13/20. Order put in and I left pt a message to return my call to discuss with him.

## 2020-12-10 NOTE — Telephone Encounter (Signed)
Called and discussed with pt. Pt states he will have to do it on Tuesday because he has appt in [place] on Monday. Pt states he will come pick up lab order and go to lab on Tuesday.

## 2020-12-20 IMAGING — DX CHEST - 2 VIEW
2 series · 2 of 2 positions shown · non-contrast
Comparison: Two-view chest x-ray 03/05/2018

CLINICAL DATA: Sharp chest pain for 4 days with recent progression.
Cough.

EXAM:
CHEST - 2 VIEW

[w chest pa]
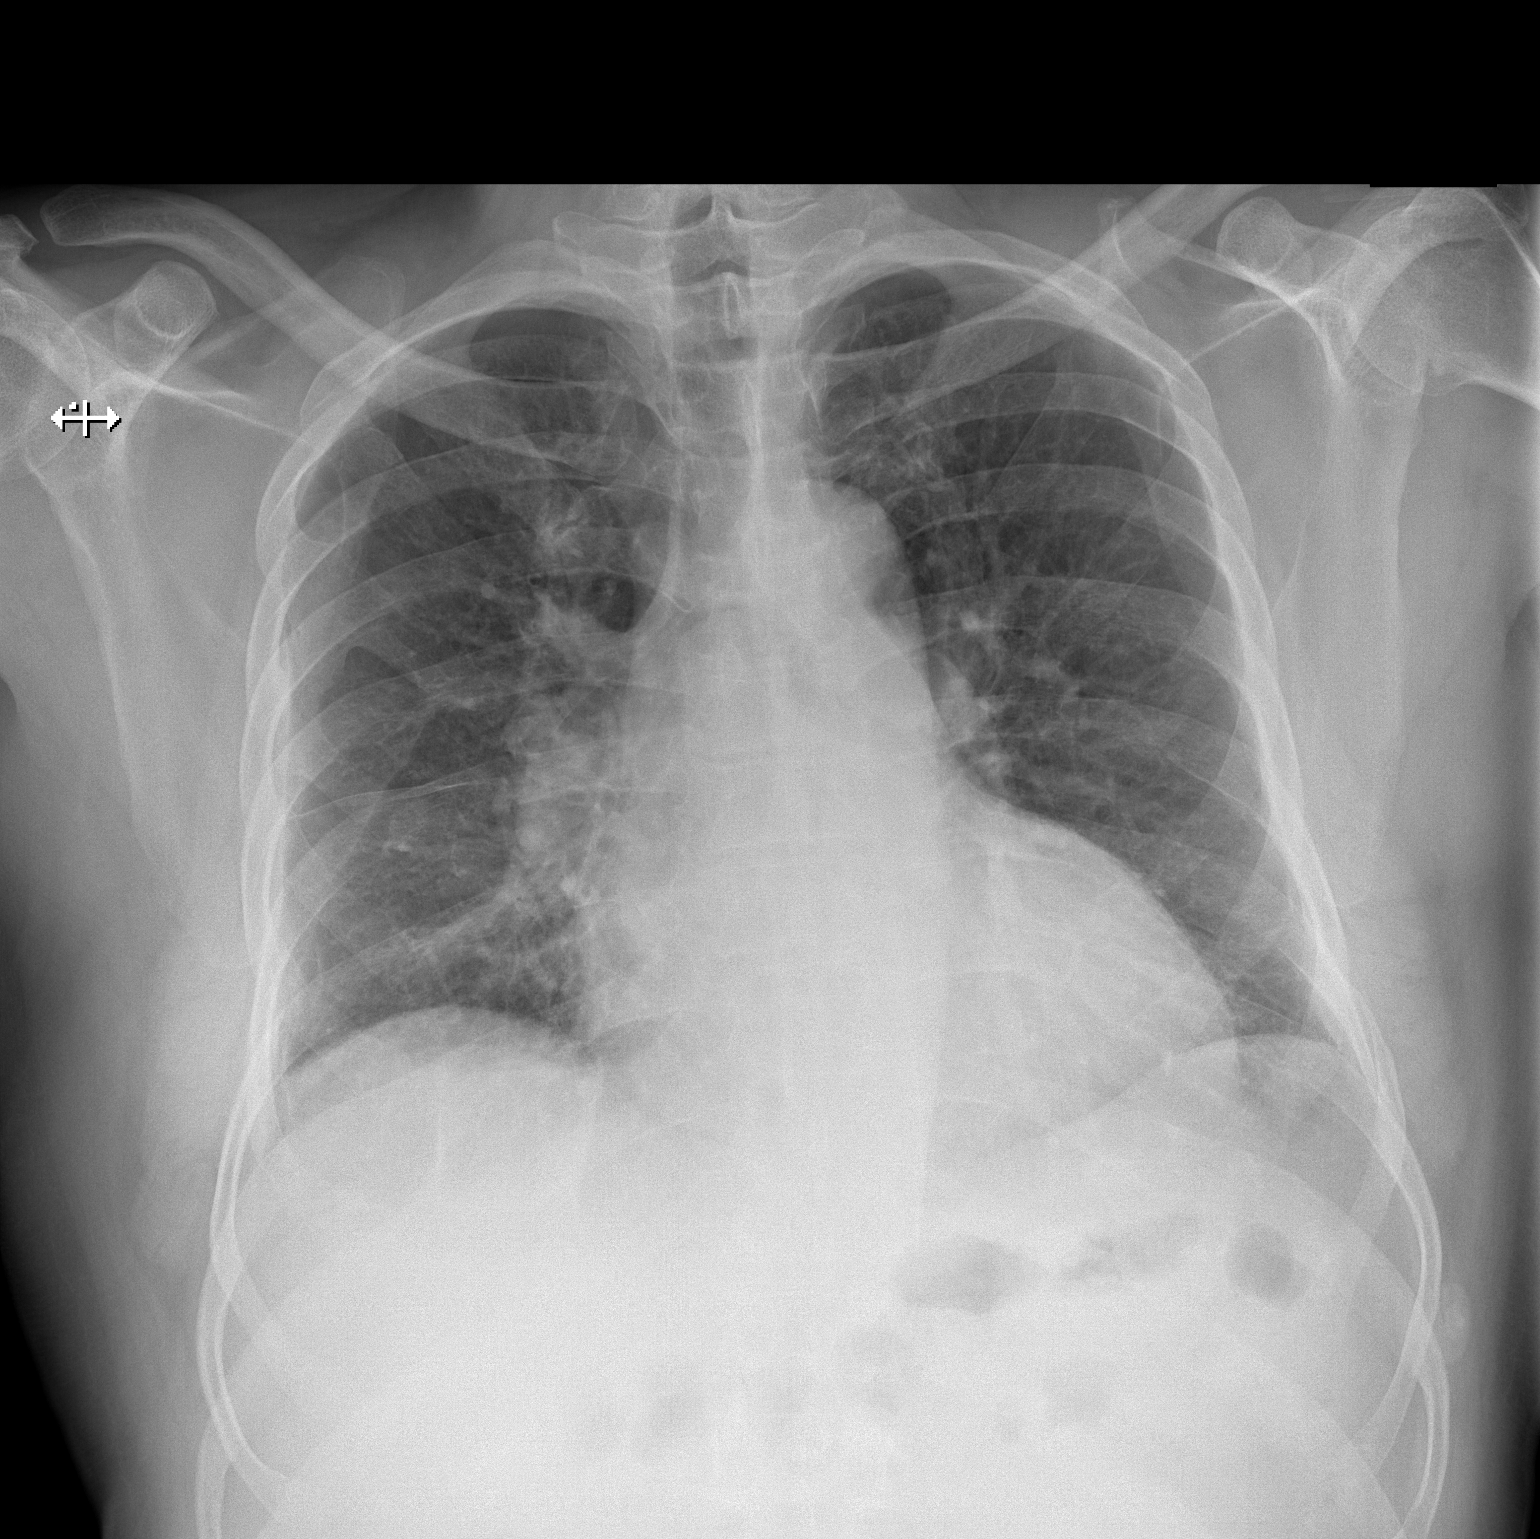

[w chest lat]
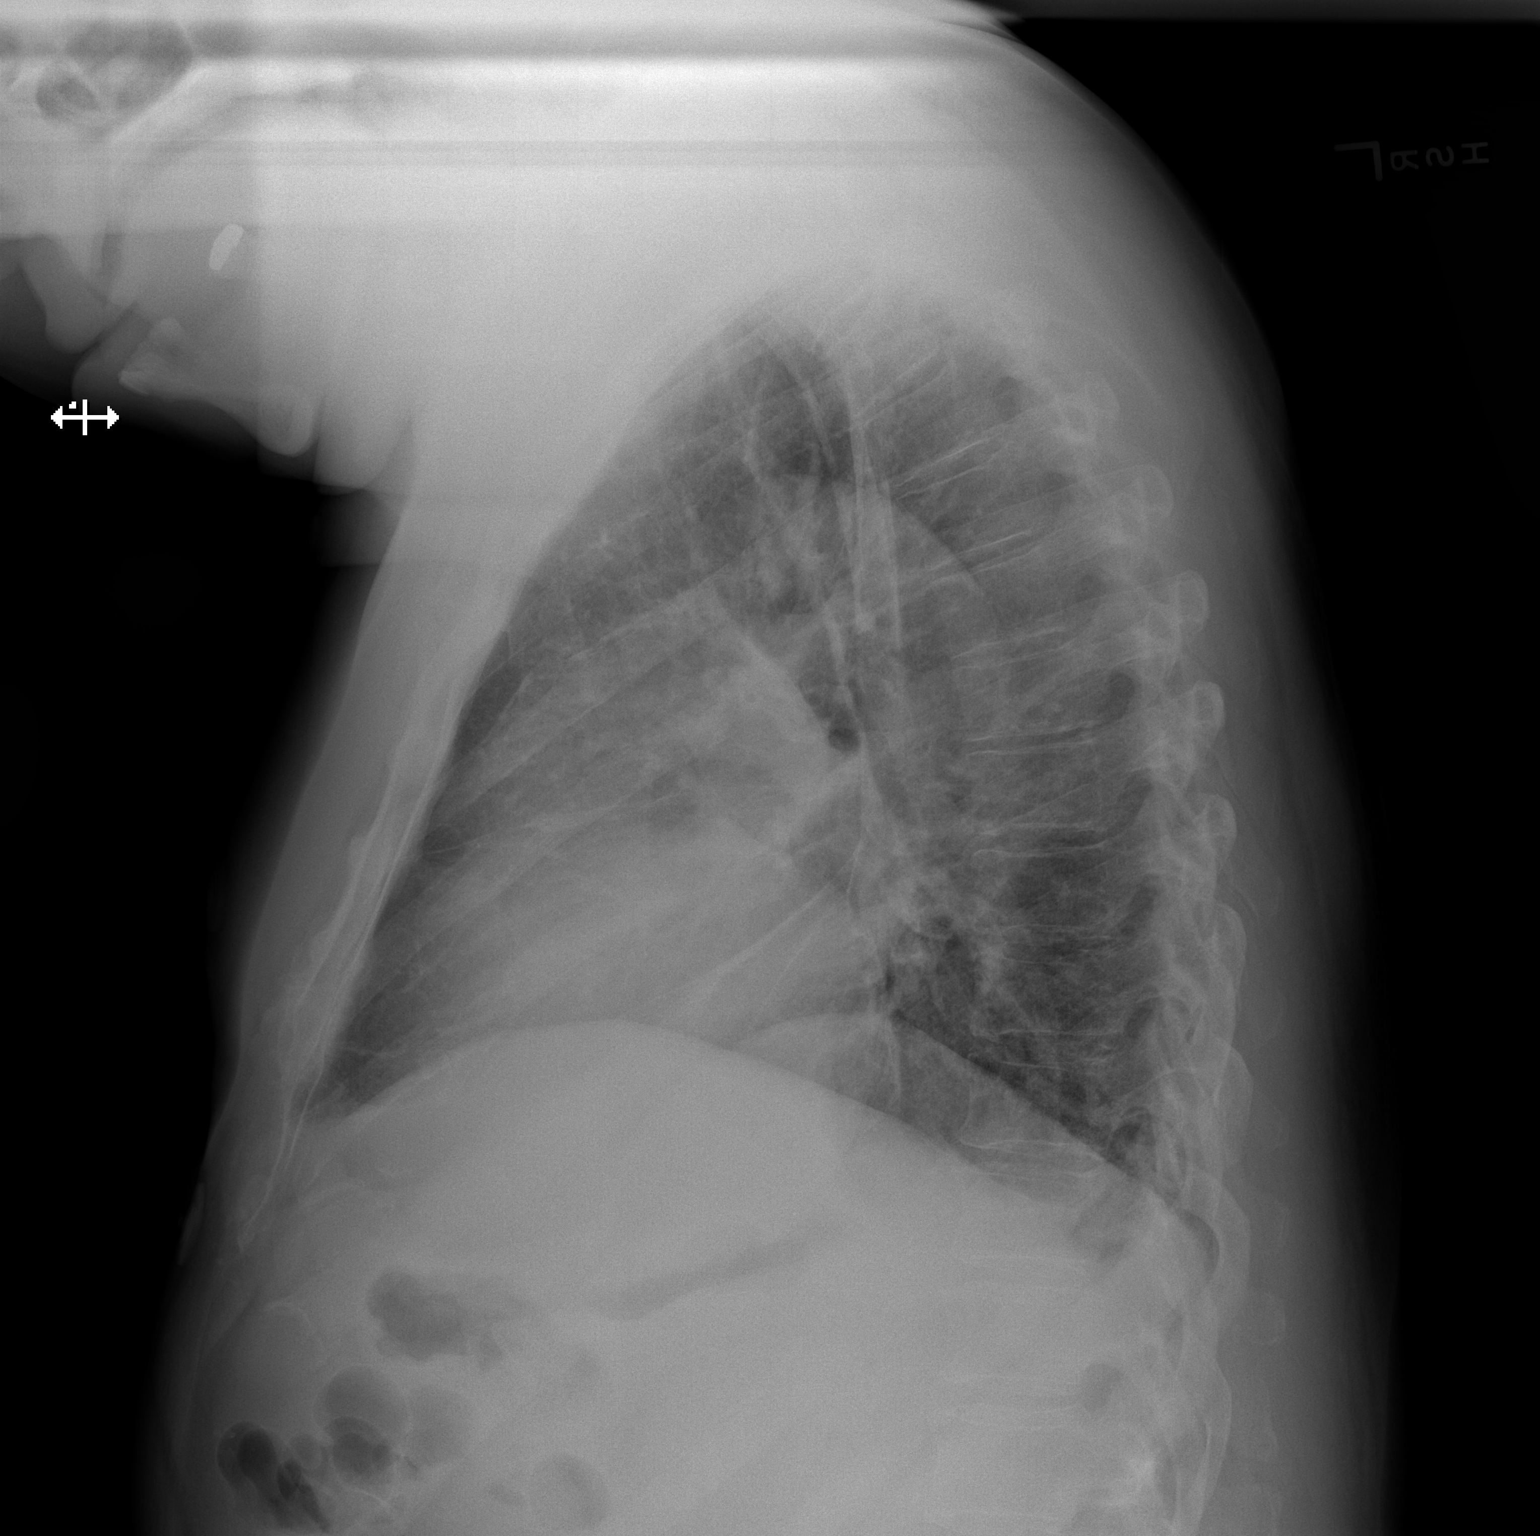

[2 of 2 positions shown; findings below may reference images not displayed]

FINDINGS: Heart size is exaggerated by low lung volumes. Mild prominence of
the interstitium is stable. There is no edema or effusion. No focal
airspace disease is present.
IMPRESSION: 1. No acute cardiopulmonary disease or significant interval change.

## 2020-12-23 ENCOUNTER — Ambulatory Visit (HOSPITAL_COMMUNITY): Payer: No Typology Code available for payment source

## 2020-12-30 ENCOUNTER — Ambulatory Visit (HOSPITAL_COMMUNITY): Payer: No Typology Code available for payment source

## 2020-12-30 ENCOUNTER — Encounter: Payer: Self-pay | Admitting: Gastroenterology

## 2020-12-30 ENCOUNTER — Ambulatory Visit: Payer: No Typology Code available for payment source | Admitting: Gastroenterology

## 2020-12-30 NOTE — Progress Notes (Signed)
This patient no showed his scheduled clinic visit on 12/30/2020. He also did not show up for his CT scan which was to be arranged before a clinic visit to evaluate whether there was potential role for endoscopic therapies in regards to his cysts and see what has manifested over the course the last few weeks. If the patient does not follow-up in clinic and/or with his CT scan, then the [place] team will not be his primary gastroenterology team and further care will need to be dictated by the unassigned call group if he were to return to [facility] and required GI care.  The same would go towards whether he followed up at [facility] or [place] centers he would be treated as an unassigned patient since he has not formally seen [facility] in the outpatient setting.  Justice Britain, MD [facility] Office # [phone]

## 2020-12-31 ENCOUNTER — Encounter (HOSPITAL_COMMUNITY): Payer: Self-pay | Admitting: *Deleted

## 2020-12-31 ENCOUNTER — Emergency Department (HOSPITAL_COMMUNITY)
Admission: EM | Admit: 2020-12-31 | Discharge: 2020-12-31 | Disposition: A | Discharge status: Home / Self Care | Payer: No Typology Code available for payment source | Attending: Emergency Medicine | Admitting: Emergency Medicine

## 2020-12-31 ENCOUNTER — Emergency Department (HOSPITAL_COMMUNITY): Payer: No Typology Code available for payment source

## 2020-12-31 DIAGNOSIS — R0602 Shortness of breath: Secondary | ICD-10-CM | POA: Diagnosis not present

## 2020-12-31 DIAGNOSIS — Z79899 Other long term (current) drug therapy: Secondary | ICD-10-CM | POA: Diagnosis not present

## 2020-12-31 DIAGNOSIS — N183 Chronic kidney disease, stage 3 unspecified: Secondary | ICD-10-CM | POA: Insufficient documentation

## 2020-12-31 DIAGNOSIS — R300 Dysuria: Secondary | ICD-10-CM | POA: Diagnosis not present

## 2020-12-31 DIAGNOSIS — J449 Chronic obstructive pulmonary disease, unspecified: Secondary | ICD-10-CM | POA: Insufficient documentation

## 2020-12-31 DIAGNOSIS — R110 Nausea: Secondary | ICD-10-CM | POA: Insufficient documentation

## 2020-12-31 DIAGNOSIS — R1013 Epigastric pain: Secondary | ICD-10-CM | POA: Insufficient documentation

## 2020-12-31 DIAGNOSIS — I130 Hypertensive heart and chronic kidney disease with heart failure and stage 1 through stage 4 chronic kidney disease, or unspecified chronic kidney disease: Secondary | ICD-10-CM | POA: Diagnosis not present

## 2020-12-31 DIAGNOSIS — Z87891 Personal history of nicotine dependence: Secondary | ICD-10-CM | POA: Insufficient documentation

## 2020-12-31 DIAGNOSIS — I5042 Chronic combined systolic (congestive) and diastolic (congestive) heart failure: Secondary | ICD-10-CM | POA: Insufficient documentation

## 2020-12-31 LAB — COMPREHENSIVE METABOLIC PANEL
ALT: 36 U/L (ref 0–44)
AST: 29 U/L (ref 15–41)
Albumin: 3.2 g/dL — ABNORMAL LOW (ref 3.5–5.0)
Alkaline Phosphatase: 325 U/L — ABNORMAL HIGH (ref 38–126)
Anion gap: 10 (ref 5–15)
BUN: 9 mg/dL (ref 8–23)
CO2: 26 mmol/L (ref 22–32)
Calcium: 9.3 mg/dL (ref 8.9–10.3)
Chloride: 102 mmol/L (ref 98–111)
Creatinine, Ser: 0.8 mg/dL (ref 0.61–1.24)
GFR, Estimated: >60 mL/min (ref >60)
Glucose, Bld: 94 mg/dL (ref 70–99)
Potassium: 4.1 mmol/L (ref 3.5–5.1)
Sodium: 138 mmol/L (ref 135–145)
Total Bilirubin: 0.8 mg/dL (ref 0.3–1.2)
Total Protein: 6.9 g/dL (ref 6.5–8.1)

## 2020-12-31 LAB — CBC
HCT: 37.3 % — ABNORMAL LOW (ref 39.0–52.0)
Hemoglobin: 12 g/dL — ABNORMAL LOW (ref 13.0–17.0)
MCH: 29.6 pg (ref 26.0–34.0)
MCHC: 32.2 g/dL (ref 30.0–36.0)
MCV: 91.9 fL (ref 80.0–100.0)
Platelets: 415 10*3/uL — ABNORMAL HIGH (ref 150–400)
RBC: 4.06 MIL/uL — ABNORMAL LOW (ref 4.22–5.81)
RDW: 17.9 % — ABNORMAL HIGH (ref 11.5–15.5)
WBC: 7 10*3/uL (ref 4.0–10.5)
nRBC: 0 % (ref 0.0–0.2)

## 2020-12-31 LAB — LIPASE, BLOOD: Lipase: 56 U/L — ABNORMAL HIGH (ref 11–51)

## 2020-12-31 MED ORDER — ALUM & MAG HYDROXIDE-SIMETH 200-200-20 MG/5ML PO SUSP
30.0000 mL | Freq: Once | ORAL | Status: AC
  Administered 2020-12-31: 30 mL via ORAL
  Filled 2020-12-31: qty 30

## 2020-12-31 MED ORDER — MORPHINE SULFATE (PF) 4 MG/ML IV SOLN
4.0000 mg | Freq: Once | INTRAVENOUS | Status: AC
  Administered 2020-12-31: 4 mg via INTRAVENOUS
  Filled 2020-12-31: qty 1

## 2020-12-31 MED ORDER — IOHEXOL 350 MG/ML SOLN
100.0000 mL | Freq: Once | INTRAVENOUS | Status: AC | PRN
  Administered 2020-12-31: 80 mL via INTRAVENOUS

## 2020-12-31 MED ORDER — LIDOCAINE VISCOUS HCL 2 % MT SOLN
15.0000 mL | Freq: Once | OROMUCOSAL | Status: AC
  Administered 2020-12-31: 15 mL via ORAL
  Filled 2020-12-31: qty 15

## 2020-12-31 MED ORDER — OXYCODONE-ACETAMINOPHEN 5-325 MG PO TABS: 1.0000 | ORAL_TABLET | Freq: Four times a day (QID) | ORAL | 0 refills | Status: DC | PRN

## 2020-12-31 MED ORDER — ONDANSETRON HCL 4 MG/2ML IJ SOLN
4.0000 mg | Freq: Once | INTRAMUSCULAR | Status: AC
  Administered 2020-12-31: 4 mg via INTRAVENOUS
  Filled 2020-12-31: qty 2

## 2020-12-31 MED ORDER — SODIUM CHLORIDE 0.9 % IV BOLUS: 1000.0000 mL | Freq: Once | INTRAVENOUS | Status: DC

## 2020-12-31 MED ORDER — SODIUM CHLORIDE 0.9 % IV BOLUS
500.0000 mL | Freq: Once | INTRAVENOUS | Status: AC
  Administered 2020-12-31: 500 mL via INTRAVENOUS
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
## 2020-12-31 NOTE — ED Triage Notes (Signed)
Recurrent abdominal pain, history of pancreatitis

## 2020-12-31 NOTE — Discharge Instructions (Signed)
Take the Percocet every 4 hours as needed for pain. Continue to eat and drink plenty of fluids at home. Follow-up with GI as planned. Return if symptoms change or worsen.

## 2021-01-06 ENCOUNTER — Ambulatory Visit (HOSPITAL_COMMUNITY): Payer: No Typology Code available for payment source

## 2021-03-23 HISTORY — PX: PANCREAS SURGERY: SHX731

## 2021-04-27 ENCOUNTER — Ambulatory Visit (HOSPITAL_COMMUNITY)
Admission: RE | Admit: 2021-04-27 | Discharge: 2021-04-27 | Disposition: A | Discharge status: Home / Self Care | Payer: Medicare Other | Source: Ambulatory Visit | Attending: Internal Medicine | Admitting: Internal Medicine

## 2021-04-27 ENCOUNTER — Other Ambulatory Visit: Payer: Self-pay

## 2021-04-27 ENCOUNTER — Encounter (HOSPITAL_COMMUNITY): Payer: Self-pay | Admitting: Internal Medicine

## 2021-04-27 VITALS — BP 90/50 | HR 91 | Wt 160.6 lb

## 2021-04-27 DIAGNOSIS — I130 Hypertensive heart and chronic kidney disease with heart failure and stage 1 through stage 4 chronic kidney disease, or unspecified chronic kidney disease: Secondary | ICD-10-CM | POA: Insufficient documentation

## 2021-04-27 DIAGNOSIS — Z91199 Patient's noncompliance with other medical treatment and regimen due to unspecified reason: Secondary | ICD-10-CM | POA: Diagnosis not present

## 2021-04-27 DIAGNOSIS — K859 Acute pancreatitis without necrosis or infection, unspecified: Secondary | ICD-10-CM | POA: Diagnosis not present

## 2021-04-27 DIAGNOSIS — R079 Chest pain, unspecified: Secondary | ICD-10-CM | POA: Diagnosis not present

## 2021-04-27 DIAGNOSIS — R109 Unspecified abdominal pain: Secondary | ICD-10-CM | POA: Insufficient documentation

## 2021-04-27 DIAGNOSIS — I5042 Chronic combined systolic (congestive) and diastolic (congestive) heart failure: Secondary | ICD-10-CM | POA: Diagnosis not present

## 2021-04-27 DIAGNOSIS — F17210 Nicotine dependence, cigarettes, uncomplicated: Secondary | ICD-10-CM | POA: Insufficient documentation

## 2021-04-27 DIAGNOSIS — Z79899 Other long term (current) drug therapy: Secondary | ICD-10-CM | POA: Insufficient documentation

## 2021-04-27 DIAGNOSIS — F1011 Alcohol abuse, in remission: Secondary | ICD-10-CM | POA: Diagnosis not present

## 2021-04-27 DIAGNOSIS — K861 Other chronic pancreatitis: Secondary | ICD-10-CM

## 2021-04-27 DIAGNOSIS — R0602 Shortness of breath: Secondary | ICD-10-CM | POA: Insufficient documentation

## 2021-04-27 DIAGNOSIS — I5022 Chronic systolic (congestive) heart failure: Secondary | ICD-10-CM | POA: Diagnosis not present

## 2021-04-27 DIAGNOSIS — Z7901 Long term (current) use of anticoagulants: Secondary | ICD-10-CM | POA: Insufficient documentation

## 2021-04-27 DIAGNOSIS — N183 Chronic kidney disease, stage 3 unspecified: Secondary | ICD-10-CM | POA: Diagnosis not present

## 2021-04-27 DIAGNOSIS — K860 Alcohol-induced chronic pancreatitis: Secondary | ICD-10-CM | POA: Diagnosis not present

## 2021-04-27 DIAGNOSIS — F17200 Nicotine dependence, unspecified, uncomplicated: Secondary | ICD-10-CM | POA: Diagnosis not present

## 2021-04-27 MED ORDER — CARVEDILOL 3.125 MG PO TABS: 3.1250 mg | ORAL_TABLET | Freq: Two times a day (BID) | ORAL | 11 refills | Status: DC

## 2021-04-27 MED ORDER — SACUBITRIL-VALSARTAN 24-26 MG PO TABS: 1.0000 | ORAL_TABLET | Freq: Two times a day (BID) | ORAL | 11 refills | Status: DC

## 2021-04-27 NOTE — Addendum Note (Signed)
Encounter addended by: Jerl Mina, RN on: 04/27/2021 2:31 PM  Actions taken: Pharmacy for encounter modified, Order list changed

## 2021-04-27 NOTE — Addendum Note (Signed)
Encounter addended by: Jerl Mina, RN on: 04/27/2021 2:31 PM  Actions taken: Pharmacy for encounter modified, Order list changed, Clinical Note Signed

## 2021-04-27 NOTE — Patient Instructions (Signed)
Thank you for your visit today.  Your physician recommends that you schedule a follow-up appointment in: 6 months (August 2023) **please call the office in June to arrange your appointment.  If you have any questions or concerns before your next appointment please send [place] a message through [place] or call our office at [phone].    TO LEAVE A MESSAGE FOR THE NURSE SELECT OPTION 2, PLEASE LEAVE A MESSAGE INCLUDING: YOUR NAME DATE OF BIRTH CALL BACK NUMBER REASON FOR CALL**this is important as we prioritize the call backs  YOU WILL RECEIVE A CALL BACK THE SAME DAY AS LONG AS YOU CALL BEFORE 4:00 PM  At the [facility], you and your health needs are our priority. As part of our continuing mission to provide you with exceptional heart care, we have created designated Provider Care Teams. These Care Teams include your primary Cardiologist (physician) and Advanced Practice Providers (APPs- Physician Assistants and Nurse Practitioners) who all work together to provide you with the care you need, when you need it.   You may see any of the following providers on your designated Care Team at your next follow up: Dr Glori Bickers Dr Haynes Kerns, NP Lyda Jester, [place] [facility] [place], [place] Audry Riles, PharmD   Please be sure to bring in all your medications bottles to every appointment.
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
## 2021-06-27 ENCOUNTER — Inpatient Hospital Stay (HOSPITAL_COMMUNITY)
Admission: EM | Admit: 2021-06-27 | Discharge: 2021-07-06 | DRG: 291 | Disposition: A | Discharge status: Home / Self Care | Payer: No Typology Code available for payment source | Attending: Internal Medicine | Admitting: Internal Medicine

## 2021-06-27 ENCOUNTER — Other Ambulatory Visit: Payer: Self-pay

## 2021-06-27 ENCOUNTER — Emergency Department (HOSPITAL_COMMUNITY): Payer: No Typology Code available for payment source

## 2021-06-27 DIAGNOSIS — R0602 Shortness of breath: Secondary | ICD-10-CM

## 2021-06-27 DIAGNOSIS — K861 Other chronic pancreatitis: Secondary | ICD-10-CM

## 2021-06-27 DIAGNOSIS — F32A Depression, unspecified: Secondary | ICD-10-CM | POA: Diagnosis present

## 2021-06-27 DIAGNOSIS — K860 Alcohol-induced chronic pancreatitis: Secondary | ICD-10-CM | POA: Diagnosis present

## 2021-06-27 DIAGNOSIS — Z833 Family history of diabetes mellitus: Secondary | ICD-10-CM

## 2021-06-27 DIAGNOSIS — I700 Atherosclerosis of aorta: Secondary | ICD-10-CM | POA: Diagnosis present

## 2021-06-27 DIAGNOSIS — E876 Hypokalemia: Secondary | ICD-10-CM | POA: Diagnosis present

## 2021-06-27 DIAGNOSIS — I130 Hypertensive heart and chronic kidney disease with heart failure and stage 1 through stage 4 chronic kidney disease, or unspecified chronic kidney disease: Principal | ICD-10-CM | POA: Diagnosis present

## 2021-06-27 DIAGNOSIS — I5043 Acute on chronic combined systolic (congestive) and diastolic (congestive) heart failure: Secondary | ICD-10-CM | POA: Diagnosis present

## 2021-06-27 DIAGNOSIS — M7989 Other specified soft tissue disorders: Secondary | ICD-10-CM | POA: Diagnosis present

## 2021-06-27 DIAGNOSIS — Z20822 Contact with and (suspected) exposure to covid-19: Secondary | ICD-10-CM | POA: Diagnosis present

## 2021-06-27 DIAGNOSIS — I10 Essential (primary) hypertension: Secondary | ICD-10-CM | POA: Diagnosis present

## 2021-06-27 DIAGNOSIS — J9601 Acute respiratory failure with hypoxia: Secondary | ICD-10-CM | POA: Diagnosis not present

## 2021-06-27 DIAGNOSIS — F1011 Alcohol abuse, in remission: Secondary | ICD-10-CM | POA: Diagnosis present

## 2021-06-27 DIAGNOSIS — I428 Other cardiomyopathies: Secondary | ICD-10-CM | POA: Diagnosis present

## 2021-06-27 DIAGNOSIS — L309 Dermatitis, unspecified: Secondary | ICD-10-CM | POA: Diagnosis present

## 2021-06-27 DIAGNOSIS — Z8249 Family history of ischemic heart disease and other diseases of the circulatory system: Secondary | ICD-10-CM

## 2021-06-27 DIAGNOSIS — R0902 Hypoxemia: Principal | ICD-10-CM

## 2021-06-27 DIAGNOSIS — I7121 Aneurysm of the ascending aorta, without rupture: Secondary | ICD-10-CM | POA: Diagnosis present

## 2021-06-27 DIAGNOSIS — R109 Unspecified abdominal pain: Secondary | ICD-10-CM

## 2021-06-27 DIAGNOSIS — F1911 Other psychoactive substance abuse, in remission: Secondary | ICD-10-CM | POA: Diagnosis present

## 2021-06-27 DIAGNOSIS — N182 Chronic kidney disease, stage 2 (mild): Secondary | ICD-10-CM | POA: Diagnosis present

## 2021-06-27 DIAGNOSIS — I5042 Chronic combined systolic (congestive) and diastolic (congestive) heart failure: Secondary | ICD-10-CM | POA: Diagnosis present

## 2021-06-27 DIAGNOSIS — M545 Low back pain, unspecified: Secondary | ICD-10-CM | POA: Diagnosis present

## 2021-06-27 DIAGNOSIS — Z806 Family history of leukemia: Secondary | ICD-10-CM

## 2021-06-27 DIAGNOSIS — I272 Pulmonary hypertension, unspecified: Secondary | ICD-10-CM | POA: Diagnosis present

## 2021-06-27 DIAGNOSIS — E785 Hyperlipidemia, unspecified: Secondary | ICD-10-CM | POA: Diagnosis present

## 2021-06-27 DIAGNOSIS — Z91048 Other nonmedicinal substance allergy status: Secondary | ICD-10-CM

## 2021-06-27 DIAGNOSIS — I472 Ventricular tachycardia, unspecified: Secondary | ICD-10-CM | POA: Diagnosis present

## 2021-06-27 DIAGNOSIS — I951 Orthostatic hypotension: Secondary | ICD-10-CM | POA: Diagnosis present

## 2021-06-27 DIAGNOSIS — Z79899 Other long term (current) drug therapy: Secondary | ICD-10-CM

## 2021-06-27 DIAGNOSIS — K529 Noninfective gastroenteritis and colitis, unspecified: Secondary | ICD-10-CM | POA: Diagnosis present

## 2021-06-27 DIAGNOSIS — F17200 Nicotine dependence, unspecified, uncomplicated: Secondary | ICD-10-CM | POA: Diagnosis present

## 2021-06-27 DIAGNOSIS — Z823 Family history of stroke: Secondary | ICD-10-CM

## 2021-06-27 DIAGNOSIS — R1013 Epigastric pain: Secondary | ICD-10-CM

## 2021-06-27 DIAGNOSIS — F17210 Nicotine dependence, cigarettes, uncomplicated: Secondary | ICD-10-CM | POA: Diagnosis present

## 2021-06-27 DIAGNOSIS — G8929 Other chronic pain: Secondary | ICD-10-CM | POA: Diagnosis present

## 2021-06-27 DIAGNOSIS — K219 Gastro-esophageal reflux disease without esophagitis: Secondary | ICD-10-CM | POA: Diagnosis present

## 2021-06-27 DIAGNOSIS — K635 Polyp of colon: Secondary | ICD-10-CM | POA: Diagnosis present

## 2021-06-27 LAB — CBC
HCT: 39.9 % (ref 39.0–52.0)
Hemoglobin: 12.7 g/dL — ABNORMAL LOW (ref 13.0–17.0)
MCH: 28.9 pg (ref 26.0–34.0)
MCHC: 31.8 g/dL (ref 30.0–36.0)
MCV: 90.7 fL (ref 80.0–100.0)
Platelets: 257 10*3/uL (ref 150–400)
RBC: 4.4 MIL/uL (ref 4.22–5.81)
RDW: 17.2 % — ABNORMAL HIGH (ref 11.5–15.5)
WBC: 6.8 10*3/uL (ref 4.0–10.5)
nRBC: 0 % (ref 0.0–0.2)

## 2021-06-27 LAB — BASIC METABOLIC PANEL
Anion gap: 8 (ref 5–15)
BUN: 14 mg/dL (ref 8–23)
CO2: 25 mmol/L (ref 22–32)
Calcium: 8.8 mg/dL — ABNORMAL LOW (ref 8.9–10.3)
Chloride: 106 mmol/L (ref 98–111)
Creatinine, Ser: 1.24 mg/dL (ref 0.61–1.24)
GFR, Estimated: >60 mL/min (ref >60)
Glucose, Bld: 102 mg/dL — ABNORMAL HIGH (ref 70–99)
Potassium: 3.6 mmol/L (ref 3.5–5.1)
Sodium: 139 mmol/L (ref 135–145)

## 2021-06-27 LAB — TROPONIN I (HIGH SENSITIVITY)
Troponin I (High Sensitivity): 16 ng/L (ref <18)
Troponin I (High Sensitivity): 17 ng/L (ref <18)

## 2021-06-27 MED ORDER — ONDANSETRON HCL 4 MG/2ML IJ SOLN
4.0000 mg | Freq: Once | INTRAMUSCULAR | Status: AC
  Administered 2021-06-27: 4 mg via INTRAVENOUS
  Filled 2021-06-27: qty 2

## 2021-06-27 MED ORDER — SODIUM CHLORIDE 0.9 % IV BOLUS
1000.0000 mL | Freq: Once | INTRAVENOUS | Status: AC
Start: 2021-06-27 — End: 2021-06-28
  Administered 2021-06-27: 1000 mL via INTRAVENOUS

## 2021-06-27 MED ORDER — MORPHINE SULFATE (PF) 4 MG/ML IV SOLN
4.0000 mg | Freq: Once | INTRAVENOUS | Status: AC
  Administered 2021-06-27: 4 mg via INTRAVENOUS
  Filled 2021-06-27: qty 1

## 2021-06-27 NOTE — ED Triage Notes (Signed)
Patient states CP, SHOB with left leg swollen for 3 days. Worsening SHOB today with lightheadedness and patient states he felt like he was going to pass out.  ?

## 2021-06-28 ENCOUNTER — Other Ambulatory Visit (HOSPITAL_COMMUNITY): Payer: No Typology Code available for payment source

## 2021-06-28 ENCOUNTER — Encounter (HOSPITAL_COMMUNITY): Payer: Self-pay | Admitting: Family Medicine

## 2021-06-28 ENCOUNTER — Inpatient Hospital Stay (HOSPITAL_COMMUNITY): Payer: No Typology Code available for payment source

## 2021-06-28 ENCOUNTER — Emergency Department (HOSPITAL_COMMUNITY): Payer: No Typology Code available for payment source

## 2021-06-28 DIAGNOSIS — Z806 Family history of leukemia: Secondary | ICD-10-CM | POA: Diagnosis not present

## 2021-06-28 DIAGNOSIS — Z8249 Family history of ischemic heart disease and other diseases of the circulatory system: Secondary | ICD-10-CM | POA: Diagnosis not present

## 2021-06-28 DIAGNOSIS — I5042 Chronic combined systolic (congestive) and diastolic (congestive) heart failure: Secondary | ICD-10-CM

## 2021-06-28 DIAGNOSIS — I428 Other cardiomyopathies: Secondary | ICD-10-CM | POA: Diagnosis present

## 2021-06-28 DIAGNOSIS — I5043 Acute on chronic combined systolic (congestive) and diastolic (congestive) heart failure: Secondary | ICD-10-CM | POA: Diagnosis present

## 2021-06-28 DIAGNOSIS — F17200 Nicotine dependence, unspecified, uncomplicated: Secondary | ICD-10-CM

## 2021-06-28 DIAGNOSIS — I700 Atherosclerosis of aorta: Secondary | ICD-10-CM | POA: Diagnosis present

## 2021-06-28 DIAGNOSIS — N183 Chronic kidney disease, stage 3 unspecified: Secondary | ICD-10-CM | POA: Diagnosis not present

## 2021-06-28 DIAGNOSIS — I5021 Acute systolic (congestive) heart failure: Secondary | ICD-10-CM | POA: Diagnosis not present

## 2021-06-28 DIAGNOSIS — J9601 Acute respiratory failure with hypoxia: Secondary | ICD-10-CM | POA: Diagnosis present

## 2021-06-28 DIAGNOSIS — E876 Hypokalemia: Secondary | ICD-10-CM | POA: Diagnosis present

## 2021-06-28 DIAGNOSIS — I10 Essential (primary) hypertension: Secondary | ICD-10-CM | POA: Diagnosis not present

## 2021-06-28 DIAGNOSIS — R109 Unspecified abdominal pain: Secondary | ICD-10-CM

## 2021-06-28 DIAGNOSIS — K219 Gastro-esophageal reflux disease without esophagitis: Secondary | ICD-10-CM | POA: Diagnosis present

## 2021-06-28 DIAGNOSIS — K860 Alcohol-induced chronic pancreatitis: Secondary | ICD-10-CM | POA: Diagnosis present

## 2021-06-28 DIAGNOSIS — Z20822 Contact with and (suspected) exposure to covid-19: Secondary | ICD-10-CM | POA: Diagnosis present

## 2021-06-28 DIAGNOSIS — I951 Orthostatic hypotension: Secondary | ICD-10-CM | POA: Diagnosis present

## 2021-06-28 DIAGNOSIS — F1911 Other psychoactive substance abuse, in remission: Secondary | ICD-10-CM | POA: Diagnosis present

## 2021-06-28 DIAGNOSIS — Z823 Family history of stroke: Secondary | ICD-10-CM | POA: Diagnosis not present

## 2021-06-28 DIAGNOSIS — E785 Hyperlipidemia, unspecified: Secondary | ICD-10-CM | POA: Diagnosis present

## 2021-06-28 DIAGNOSIS — K635 Polyp of colon: Secondary | ICD-10-CM | POA: Diagnosis present

## 2021-06-28 DIAGNOSIS — F32A Depression, unspecified: Secondary | ICD-10-CM | POA: Diagnosis present

## 2021-06-28 DIAGNOSIS — F1011 Alcohol abuse, in remission: Secondary | ICD-10-CM | POA: Diagnosis present

## 2021-06-28 DIAGNOSIS — I472 Ventricular tachycardia, unspecified: Secondary | ICD-10-CM | POA: Diagnosis present

## 2021-06-28 DIAGNOSIS — K529 Noninfective gastroenteritis and colitis, unspecified: Secondary | ICD-10-CM

## 2021-06-28 DIAGNOSIS — I7121 Aneurysm of the ascending aorta, without rupture: Secondary | ICD-10-CM | POA: Diagnosis present

## 2021-06-28 DIAGNOSIS — I420 Dilated cardiomyopathy: Secondary | ICD-10-CM | POA: Diagnosis not present

## 2021-06-28 DIAGNOSIS — I5023 Acute on chronic systolic (congestive) heart failure: Secondary | ICD-10-CM | POA: Diagnosis not present

## 2021-06-28 DIAGNOSIS — I5020 Unspecified systolic (congestive) heart failure: Secondary | ICD-10-CM | POA: Diagnosis not present

## 2021-06-28 DIAGNOSIS — I130 Hypertensive heart and chronic kidney disease with heart failure and stage 1 through stage 4 chronic kidney disease, or unspecified chronic kidney disease: Secondary | ICD-10-CM | POA: Diagnosis present

## 2021-06-28 DIAGNOSIS — I272 Pulmonary hypertension, unspecified: Secondary | ICD-10-CM | POA: Diagnosis present

## 2021-06-28 DIAGNOSIS — G8929 Other chronic pain: Secondary | ICD-10-CM | POA: Diagnosis present

## 2021-06-28 DIAGNOSIS — L309 Dermatitis, unspecified: Secondary | ICD-10-CM | POA: Diagnosis present

## 2021-06-28 LAB — COMPREHENSIVE METABOLIC PANEL
ALT: 25 U/L (ref 0–44)
AST: 31 U/L (ref 15–41)
Albumin: 3.3 g/dL — ABNORMAL LOW (ref 3.5–5.0)
Alkaline Phosphatase: 117 U/L (ref 38–126)
Anion gap: 8 (ref 5–15)
BUN: 15 mg/dL (ref 8–23)
CO2: 24 mmol/L (ref 22–32)
Calcium: 8.6 mg/dL — ABNORMAL LOW (ref 8.9–10.3)
Chloride: 108 mmol/L (ref 98–111)
Creatinine, Ser: 1.06 mg/dL (ref 0.61–1.24)
GFR, Estimated: >60 mL/min (ref >60)
Glucose, Bld: 88 mg/dL (ref 70–99)
Potassium: 4.1 mmol/L (ref 3.5–5.1)
Sodium: 140 mmol/L (ref 135–145)
Total Bilirubin: 0.7 mg/dL (ref 0.3–1.2)
Total Protein: 6.8 g/dL (ref 6.5–8.1)

## 2021-06-28 LAB — CBC WITH DIFFERENTIAL/PLATELET
Abs Immature Granulocytes: 0.03 10*3/uL (ref 0.00–0.07)
Basophils Absolute: 0.1 10*3/uL (ref 0.0–0.1)
Basophils Relative: 1 %
Eosinophils Absolute: 0.3 10*3/uL (ref 0.0–0.5)
Eosinophils Relative: 5 %
HCT: 38.8 % — ABNORMAL LOW (ref 39.0–52.0)
Hemoglobin: 12.2 g/dL — ABNORMAL LOW (ref 13.0–17.0)
Immature Granulocytes: 1 %
Lymphocytes Relative: 24 %
Lymphs Abs: 1.4 10*3/uL (ref 0.7–4.0)
MCH: 28.5 pg (ref 26.0–34.0)
MCHC: 31.4 g/dL (ref 30.0–36.0)
MCV: 90.7 fL (ref 80.0–100.0)
Monocytes Absolute: 0.7 10*3/uL (ref 0.1–1.0)
Monocytes Relative: 11 %
Neutro Abs: 3.5 10*3/uL (ref 1.7–7.7)
Neutrophils Relative %: 58 %
Platelets: 227 10*3/uL (ref 150–400)
RBC: 4.28 MIL/uL (ref 4.22–5.81)
RDW: 17 % — ABNORMAL HIGH (ref 11.5–15.5)
WBC: 6 10*3/uL (ref 4.0–10.5)
nRBC: 0 % (ref 0.0–0.2)

## 2021-06-28 LAB — HEPATIC FUNCTION PANEL
ALT: 26 U/L (ref 0–44)
AST: 32 U/L (ref 15–41)
Albumin: 3.5 g/dL (ref 3.5–5.0)
Alkaline Phosphatase: 118 U/L (ref 38–126)
Bilirubin, Direct: 0.2 mg/dL (ref 0.0–0.2)
Indirect Bilirubin: 0.6 mg/dL (ref 0.3–0.9)
Total Bilirubin: 0.8 mg/dL (ref 0.3–1.2)
Total Protein: 7.1 g/dL (ref 6.5–8.1)

## 2021-06-28 LAB — LIPASE, BLOOD: Lipase: 27 U/L (ref 11–51)

## 2021-06-28 LAB — MAGNESIUM: Magnesium: 2.3 mg/dL (ref 1.7–2.4)

## 2021-06-28 LAB — GLUCOSE, CAPILLARY: Glucose-Capillary: 125 mg/dL — ABNORMAL HIGH (ref 70–99)

## 2021-06-28 LAB — D-DIMER, QUANTITATIVE: D-Dimer, Quant: 0.81 ug/mL-FEU — ABNORMAL HIGH (ref 0.00–0.50)

## 2021-06-28 LAB — BRAIN NATRIURETIC PEPTIDE: B Natriuretic Peptide: 1701 pg/mL — ABNORMAL HIGH (ref 0.0–100.0)

## 2021-06-28 MED ORDER — HEPARIN SODIUM (PORCINE) 5000 UNIT/ML IJ SOLN
5000.0000 [IU] | Freq: Three times a day (TID) | INTRAMUSCULAR | Status: DC
  Filled 2021-06-28 (×11): qty 1

## 2021-06-28 MED ORDER — MORPHINE SULFATE (PF) 2 MG/ML IV SOLN
2.0000 mg | INTRAVENOUS | Status: DC | PRN
  Administered 2021-06-28 – 2021-06-29 (×3): 2 mg via INTRAVENOUS
  Filled 2021-06-28 (×3): qty 1

## 2021-06-28 MED ORDER — ONDANSETRON HCL 4 MG/2ML IJ SOLN: 4.0000 mg | Freq: Four times a day (QID) | INTRAMUSCULAR | Status: DC | PRN

## 2021-06-28 MED ORDER — IVABRADINE HCL 5 MG PO TABS
5.0000 mg | ORAL_TABLET | Freq: Two times a day (BID) | ORAL | Status: DC
  Administered 2021-06-28 – 2021-07-06 (×16): 5 mg via ORAL
  Filled 2021-06-28 (×23): qty 1

## 2021-06-28 MED ORDER — ACETAMINOPHEN 325 MG PO TABS
650.0000 mg | ORAL_TABLET | Freq: Four times a day (QID) | ORAL | Status: DC | PRN
  Administered 2021-07-02 – 2021-07-03 (×2): 650 mg via ORAL
  Filled 2021-06-28 (×2): qty 2

## 2021-06-28 MED ORDER — IOHEXOL 350 MG/ML SOLN
100.0000 mL | Freq: Once | INTRAVENOUS | Status: AC | PRN
Start: 2021-06-28 — End: 2021-06-28
  Administered 2021-06-28: 100 mL via INTRAVENOUS

## 2021-06-28 MED ORDER — SACUBITRIL-VALSARTAN 24-26 MG PO TABS
1.0000 | ORAL_TABLET | Freq: Two times a day (BID) | ORAL | Status: DC
  Administered 2021-06-28 – 2021-06-29 (×2): 1 via ORAL
  Filled 2021-06-28 (×4): qty 1

## 2021-06-28 MED ORDER — MORPHINE SULFATE (PF) 4 MG/ML IV SOLN
4.0000 mg | Freq: Once | INTRAVENOUS | Status: AC
  Administered 2021-06-28: 4 mg via INTRAVENOUS
  Filled 2021-06-28: qty 1

## 2021-06-28 MED ORDER — PANTOPRAZOLE SODIUM 40 MG IV SOLR
40.0000 mg | INTRAVENOUS | Status: DC
  Administered 2021-06-28 – 2021-06-29 (×2): 40 mg via INTRAVENOUS
  Filled 2021-06-28 (×2): qty 10

## 2021-06-28 MED ORDER — CARVEDILOL 3.125 MG PO TABS
3.1250 mg | ORAL_TABLET | Freq: Two times a day (BID) | ORAL | Status: DC
  Administered 2021-06-28 (×2): 3.125 mg via ORAL
  Filled 2021-06-28 (×3): qty 1

## 2021-06-28 MED ORDER — FUROSEMIDE 10 MG/ML IJ SOLN
40.0000 mg | Freq: Once | INTRAMUSCULAR | Status: AC
  Administered 2021-06-28: 40 mg via INTRAVENOUS
  Filled 2021-06-28: qty 4

## 2021-06-28 MED ORDER — GABAPENTIN 300 MG PO CAPS
300.0000 mg | ORAL_CAPSULE | Freq: Two times a day (BID) | ORAL | Status: DC
  Administered 2021-06-28 – 2021-07-06 (×17): 300 mg via ORAL
  Filled 2021-06-28 (×17): qty 1

## 2021-06-28 MED ORDER — FUROSEMIDE 10 MG/ML IJ SOLN
40.0000 mg | Freq: Two times a day (BID) | INTRAMUSCULAR | Status: AC
  Administered 2021-06-28 – 2021-06-29 (×3): 40 mg via INTRAVENOUS
  Filled 2021-06-28 (×3): qty 4

## 2021-06-28 MED ORDER — ACETAMINOPHEN 650 MG RE SUPP: 650.0000 mg | Freq: Four times a day (QID) | RECTAL | Status: DC | PRN

## 2021-06-28 MED ORDER — OXYCODONE HCL 5 MG PO TABS
5.0000 mg | ORAL_TABLET | ORAL | Status: DC | PRN
  Administered 2021-06-28 – 2021-07-02 (×11): 5 mg via ORAL
  Filled 2021-06-28 (×11): qty 1

## 2021-06-28 MED ORDER — ALBUTEROL SULFATE (2.5 MG/3ML) 0.083% IN NEBU
2.5000 mg | INHALATION_SOLUTION | RESPIRATORY_TRACT | Status: DC | PRN
  Administered 2021-06-30: 2.5 mg via RESPIRATORY_TRACT
  Filled 2021-06-28: qty 3

## 2021-06-28 MED ORDER — ONDANSETRON HCL 4 MG PO TABS: 4.0000 mg | ORAL_TABLET | Freq: Four times a day (QID) | ORAL | Status: DC | PRN

## 2021-06-28 MED ORDER — ENSURE ENLIVE PO LIQD
237.0000 mL | Freq: Two times a day (BID) | ORAL | Status: DC
  Administered 2021-06-28 – 2021-07-06 (×14): 237 mL via ORAL

## 2021-06-28 NOTE — Assessment & Plan Note (Signed)
-   Patient reports that he is having his normal abdominal pain that he associates with his pancreas ?-Lipase 27 ?-Patient has history of chronic pancreatitis and had a stent placed 2 months ago ?-He reports his pain is no worse today than normal ?-He has had no vomiting or diarrhea ?-CT scan shows possible enteritis/duodenitis and improvement in previously noted pancreatic edema ?-Continue PPI ?-Continue supportive care ?

## 2021-06-28 NOTE — Assessment & Plan Note (Signed)
Continue statin. 

## 2021-06-28 NOTE — Plan of Care (Signed)
Patient admitted to Room 310. All questions answered, understands plan of care. All needs addressed at this time. ?

## 2021-06-28 NOTE — Progress Notes (Signed)
PROGRESS NOTE ? ?Brief Narrative: ?AUTHOR HATLESTAD is a 71 y.o. male with a history of combined HFrEF (last LVEF 15-20%), chronic pancreatitis s/p PD stent, suspicion for possible pancreatic cancer, history of alcohol and polysubstance abuse who presented to the ED with L > R leg swelling and orthopnea/PND in addition to decreased exercise tolerance. He's also had nonproductive cough and weight loss. In the ED he was afebrile with WBC 6.8k, troponin 16 > 17, d-dimer 0.81. CT chest/abd/pelvis had many notable findings, detailed below.  ? ?Subjective: ?Main reason for coming in was swollen left greater than right leg "that's never done that before." this is stable. He's had subjectively brisk UOP with lasix this morning.  ? ?Objective: ?BP 113/88 (BP Location: Left Arm)   Pulse (!) 103   Temp 97.6 ?F (36.4 ?C) (Oral)   Resp 17   Ht '5\' 11"'$  (1.803 m)   Wt 78.2 kg   SpO2 100%   BMI 24.03 kg/m?   ?Gen: Pleasant nontoxic male ?Pulm: Nonlabored at rest with crackles, diminished at bases. No wheezes.  ?CV: RRR, no murmur, +JVD, ++ L > R edema ?GI: Soft, distended with generalized tenderness without palpable mass or rebound or guarding. +BS  ?Neuro: Alert and oriented. No focal deficits. ?Skin: No rashes, lesions or ulcers on visualized skin ? ?CTA chest, abd/pelvis:  ?1. No evidence of pulmonary arterial emboli. Prominent pulmonary ?trunk and main arteries indicating arterial hypertension. ?2. IVC contrast reflux which could be from right heart strain, ?tricuspid regurgitation or rapid contrast bolus. ?3. Moderate to severe panchamber cardiomegaly, increased in ?prominence over prior studies with increased venous distension, mild ?subpleural interstitial edema in the lung bases, and ?right-greater-than-left layering pleural effusions with suspected ?partial loculations of fluid on the right medially and in the major ?fissure. ?4. Right upper and bilateral lower lobe bronchial thickening ?greatest in the right lower  lobe where there are small bronchial ?impactions in the posterior base consistent with small airways ?disease or infectious bronchiolitis. No focal infiltrate is seen. ?Few slightly prominent right hilar nodes likely reactive. ?5. Coronary artery and aortic atherosclerosis, with 4.2 cm ?aneurysmal aortic root and 3.9 cm borderline ascending aortic ?aneurysmal prominence. No AAA. Annual CTA or MRA follow-up ?recommended. ?6. Nearly resolved changes of peripancreatic edema noted previously, ?with increased pancreatic calcifications of chronic calcific ?pancreatitis. ?7. Increased stranding changes around the duodenum and increased ?descending duodenal wall thickening which could be due to increased ?duodenitis, ulcerative disease or infiltrating disease. Endoscopic ?follow-up recommended unless recently done. ?8. Interval procedure with a tubular device now connecting the ?inferior proximal stomach to an underlying jejunal segment. There ?are mildly dilated left abdominal small bowel loops with fluid ?filled intermixed with collapsed small bowel loops in the mid and ?lower abdomen. A focal transitional segment is not seen. Findings ?could be secondary to enteritis/ileus or a low-grade partial small ?bowel obstruction. ?9. Nonobstructive nephrolithiasis right kidney. Bilateral renal ?cortical thinning. ?10. Increased gallbladder wall thickening and pericholecystic edema ?without visible stones. This could be congestive or due to ?cholecystitis, versus reactive from the inflammatory changes at the ?pancreatic head and duodenum. ?11. Increased mesenteric and body wall congestive changes. Much ?decreased ascites with only minimal pelvic ascites remaining. ? ?Assessment & Plan: ?Acute hypoxic respiratory failure: Presumably due to acute on chronic HFrEF: No PE, but suspicion of PAH. Interstitial pulmonary edema noted on CTA along with evidence of peripheral volume overload.  ?- Cotninue exertional supplemental oxygen and  treatment of CHF as below.  ?- Noted  CTA with ?infectious bronchiolitis > no wheezing, no fever or leukocytosis. Will monitor without further directed management at this time.  ? ?Acute on chronic HFrEF: No recent angina nor cardiac enzyme elevation. Not planning further evaluation/repeat echo at this time.  ?- Appreciate cardiology consult. Pt followed by Dr. Haroldine Laws in advanced HF clinic. Will monitor I/O, daily weights, renal function on current diuretic regimen and adjust as necessary.  ?- GDMT as tolerated per cardiology. ? ?History of chronic pancreatitis: Tolerating diet currently. Note possible concern for pancreatic neoplasm, and has been evaluated by [facility] surgical oncology, Dr. Stann Ore. Particular concern for CT's in the past showing narrowing of the portal vein and soft tissue stranding along SMA. Has needed gastroduodenostomy due to duodenal stricturing. LFTs here are wnl. Lipase is 27, CT evidence of improving peripancreatic edema. ?- Continue outpatient work up.  ? ?Leg swelling: In conjunction with known cardiomyopathy and PND/orthopnea, suspect this is due to CHF, though asymmetric, so checking LE venous U/S.  ? ?3.9cm ascending aortic aneurysm: Annual imaging recommended. ? ?Concern for enteritis and/or partial small bowel obstruction by CT scan: Pt is having BMs and tolerating po without vomiting.  ?- Continue PPI ? ?Patrecia Pour, MD ?Pager on amion ?06/28/2021, 9:57 AM   ?

## 2021-06-28 NOTE — ED Notes (Signed)
Hospitalist in with pt at this time  

## 2021-06-28 NOTE — Assessment & Plan Note (Signed)
-   Oxygen saturations dropping down to 75% with minimal exertion ?-D-dimer elevated 0.81 ?-CT chest showed no PE, but did show arterial pulmonary hypertension, right heart strain, subpleural interstitial edema, and pleural effusions right greater than left ?-Most likely acute respiratory failure with hypoxia secondary to CHF exacerbation ?-Continue treatment under CHF ?-Continue to monitor ?

## 2021-06-28 NOTE — Assessment & Plan Note (Signed)
Supportive care ?Continue PPI ?

## 2021-06-28 NOTE — Assessment & Plan Note (Signed)
-  Continue Coreg 

## 2021-06-28 NOTE — Assessment & Plan Note (Signed)
-   Patient reports he is weaning himself off of cigarettes, and only smokes every now and then ?-Declines nicotine patch at this time ?-Counseled on importance of cessation ?

## 2021-06-28 NOTE — ED Provider Notes (Signed)
?[facility] ?Provider Note ? ? ?CSN: [id number] ?Arrival date & time: 06/27/21  1218 ? ?  ? ?History ? ?Chief Complaint  ?Patient presents with  ? Chest Pain  ? Shortness of Breath  ? ? ?Ryan [name] is a 71 y.o. male. ? ?HPI ? ?  ? ?This is a 71 year old male with a history of chronic pancreatitis, heart failure, hypertension, polysubstance abuse who presents with chest pain, shortness of breath, abdominal pain.  Patient reports he has had worsening symptoms over the last 3 days.  At baseline he has some chest pain and shortness of breath.  However, over the last 3 days this has become worse.  Reports that he feels winded when doing minimal activity.  Also has had some lower extremity swelling.  He also has had increasing abdominal pain.  He states he always has abdominal pain and "I am supposed to have another surgery for my pancreas."  He states he has not had alcohol in over 6 months.  Patient was primarily treated at the [place] in [place].  Patient denies any recent fevers or cough. ? ?Home Medications ?Prior to Admission medications   ?Medication Sig Start Date End Date Taking? Authorizing Provider  ?albuterol (VENTOLIN HFA) 108 (90 Base) MCG/ACT inhaler Inhale 2 puffs into the lungs every 6 (six) hours as needed for wheezing or shortness of breath.    [provider]  ?bismuth subsalicylate (PEPTO BISMOL) 262 MG/15ML suspension Take 30 mLs by mouth every 6 (six) hours as needed for indigestion.    [provider]  ?carvedilol (COREG) 3.125 MG tablet Take 1 tablet (3.125 mg total) by mouth 2 (two) times daily with a meal. 04/27/21   Bensimhon, Shaune Pascal, MD  ?furosemide (LASIX) 40 MG tablet Take 40 mg by mouth 2 (two) times daily.    [provider]  ?gabapentin (NEURONTIN) 300 MG capsule Take 300 mg by mouth 2 (two) times daily.    [provider]  ?guaiFENesin (ROBITUSSIN) 100 MG/5ML liquid Take 200 mg by mouth every 12 (twelve) hours as needed for cough.     [provider]  ?ivabradine (CORLANOR) 5 MG TABS tablet Take 1 tablet (5 mg total) by mouth 2 (two) times daily with a meal. 08/06/19   Bensimhon, Shaune Pascal, MD  ?loratadine (CLARITIN) 10 MG tablet Take 10 mg by mouth at bedtime.    [provider]  ?Multiple Vitamin (MULTI-VITAMIN) tablet Take 1 tablet by mouth daily.    [provider]  ?naloxone (NARCAN) nasal spray 4 mg/0.1 mL Place 1 spray into the nose once as needed (overdose).    [provider]  ?Nutritional Supplements (ENSURE ACTIVE PO) Take 1 Can by mouth in the morning, at noon, and at bedtime. 3 to 4 x a day    [provider]  ?pantoprazole (PROTONIX) 40 MG tablet Take 1 tablet (40 mg total) by mouth daily. 10/08/19   Nuala Alpha A, PA-C  ?polyethylene glycol (MIRALAX / GLYCOLAX) 17 g packet Take 17 g by mouth as needed.    [provider]  ?predniSONE (DELTASONE) 10 MG tablet Take 10 mg by mouth as needed.    [provider]  ?sacubitril-valsartan (ENTRESTO) 24-26 MG Take 1 tablet by mouth 2 (two) times daily. 04/27/21   Bensimhon, Shaune Pascal, MD  ?   ? ?Allergies    ?Tape   ? ?Review of Systems   ?Review of Systems  ?Constitutional:  Negative for fever.  ?Respiratory:  Positive for shortness of breath.   ?Cardiovascular:  Positive for chest pain. Negative for leg swelling.  ?Gastrointestinal:  Positive for abdominal pain. Negative for nausea and vomiting.  ?All other systems reviewed and are negative. ? ?Physical Exam ?Updated Vital Signs ?BP 113/75   Pulse 97   Temp 98.1 ?F (36.7 ?C)   Resp 20   Ht 1.803 m ('5\' 11"'$ )   Wt 75 kg   SpO2 99%   BMI 23.06 kg/m?  ?Physical Exam ?Vitals and nursing note reviewed.  ?Constitutional:   ?   Appearance: He is well-developed.  ?   Comments: Chronically ill-appearing but nontoxic  ?HENT:  ?   Head: Normocephalic and atraumatic.  ?   Mouth/Throat:  ?   Mouth: Mucous membranes are dry.  ?Eyes:  ?   Pupils: Pupils are equal, round, and reactive  to light.  ?Cardiovascular:  ?   Rate and Rhythm: Regular rhythm. Tachycardia present.  ?   Heart sounds: Normal heart sounds. No murmur heard. ?Pulmonary:  ?   Effort: Pulmonary effort is normal. No respiratory distress.  ?   Breath sounds: Normal breath sounds. No wheezing.  ?Abdominal:  ?   General: Bowel sounds are normal.  ?   Palpations: Abdomen is soft.  ?   Tenderness: There is no abdominal tenderness. There is no rebound.  ?Musculoskeletal:  ?   Cervical back: Neck supple.  ?   Right lower leg: No tenderness. No edema.  ?   Left lower leg: No tenderness. No edema.  ?Lymphadenopathy:  ?   Cervical: No cervical adenopathy.  ?Skin: ?   General: Skin is warm and dry.  ?Neurological:  ?   Mental Status: He is alert and oriented to person, place, and time.  ?Psychiatric:     ?   Mood and Affect: Mood normal.  ? ? ?ED Results / Procedures / Treatments   ?Labs ?(all labs ordered are listed, but only abnormal results are displayed) ?Labs Reviewed  ?BASIC METABOLIC PANEL - Abnormal; Notable for the following components:  ?    Result Value  ? Glucose, Bld 102 (*)   ? Calcium 8.8 (*)   ? All other components within normal limits  ?CBC - Abnormal; Notable for the following components:  ? Hemoglobin 12.7 (*)   ? RDW 17.2 (*)   ? All other components within normal limits  ?D-DIMER, QUANTITATIVE - Abnormal; Notable for the following components:  ? D-Dimer, Quant 0.81 (*)   ? All other components within normal limits  ?HEPATIC FUNCTION PANEL  ?LIPASE, BLOOD  ?BRAIN NATRIURETIC PEPTIDE  ?TROPONIN I (HIGH SENSITIVITY)  ?TROPONIN I (HIGH SENSITIVITY)  ? ? ?EKG ?EKG Interpretation ? ?Date/Time:  Monday June 27 2021 12:31:34 EDT ?Ventricular Rate:  111 ?PR Interval:  164 ?QRS Duration: 102 ?QT Interval:  338 ?QTC Calculation: 459 ?R Axis:   58 ?Text Interpretation: Sinus tachycardia Otherwise normal ECG When compared with ECG of 27-Nov-2020 09:07, PREVIOUS ECG IS PRESENT No significant change since last tracing Confirmed by  Thayer Jew [phone]) on 06/27/2021 11:08:48 PM ? ?Radiology ?DG Chest 2 View ? ?Result Date: 06/27/2021 ?CLINICAL DATA:  Chest pain and shortness of breath for several days. Left leg swelling. Lightheadedness. EXAM: CHEST - 2 VIEW COMPARISON:  11/27/2020 FINDINGS: Stable mild-to-moderate cardiomegaly. Low lung volumes again seen. Pleural-parenchymal scarring in the right lower lung remains stable. No evidence of pulmonary infiltrate or edema. IMPRESSION: Stable cardiomegaly and right lower lung scarring. No active lung disease. Electronically Signed  By: Marlaine Hind M.D.   On: 06/27/2021 12:53  ? ?CT Angio Chest PE W and/or Wo Contrast ? ?Result Date: 06/28/2021 ?CLINICAL DATA:  Positive D-dimer, worsening shortness of breath, lightheadedness, history of pancreatitis. EXAM: CT ANGIOGRAPHY CHEST CT ABDOMEN AND PELVIS WITH CONTRAST TECHNIQUE: Multidetector CT imaging of the chest was performed using the standard protocol during bolus administration of intravenous contrast. Multiplanar CT image reconstructions and MIPs were obtained to evaluate the vascular anatomy. Multidetector CT imaging of the abdomen and pelvis was performed using the standard protocol during bolus administration of intravenous contrast. RADIATION DOSE REDUCTION: This exam was performed according to the departmental dose-optimization program which includes automated exposure control, adjustment of the mA and/or kV according to patient size and/or use of iterative reconstruction technique. CONTRAST:  154m OMNIPAQUE IOHEXOL 350 MG/ML SOLN COMPARISON:  CT chest without contrast 11/23/2015, CT abdomen and pelvis with contrast 12/31/2020. FINDINGS: CTA CHEST FINDINGS Cardiovascular: There is IVC reflux which may be seen with right heart strain, tricuspid regurgitation, and rapid contrast injection. There is moderate to severe panchamber cardiomegaly increased over all of the prior studies. No pericardial effusion is seen. There is left circumflex  coronary artery calcification proximally without other visible CAD. There is mild aortic atherosclerosis with a slightly aneurysmal aortic root measuring 4.2 cm, borderline aneurysmal ascending segment 3.9 cm.

## 2021-06-28 NOTE — Progress Notes (Addendum)
Initial Nutrition Assessment ? ?DOCUMENTATION CODES:  ? ?  ? ?INTERVENTION:  ?Ensure Enlive po BID  ? ?Attempted diet education. Handout-"Heart Failure Eating Plan" attached for patient to take home.  ? ?NUTRITION DIAGNOSIS:  ? ?Limited adherence to nutrition-related recommendations related to chronic illness (self reported disregard for low sodium diet) as evidenced by per patient/family report. ? ?GOAL:  ? (Patient will meet energy /protein goal while improve fluid status.) ? ? ?MONITOR:  ?PO intake, Supplement acceptance, Weight trends, Labs ? ?REASON FOR ASSESSMENT:  ? ?Malnutrition Screening Tool ?  ? ?ASSESSMENT: Patient is a 71 yo male with hx of  CHF, Chronic pancreatitis (s/p biliary stent), Pulmonary HTN, CKD-3 and polysubstance abuse. Presents with acute HF.  ? ?Patient has weight gain since February-5.4 kg (7.4%) x 2 months which is significant. Current wt 78.2 kg.  ? ?Patient readily confesses that he is addicted to salt and has no expectation of changing food choices to modify intake of the substance. He refused review of low sodium diet guidelines. Will attach copy of Low Sodium recommendations to discharge instructions.  ? ?Medications: Lasix, Protonix.  ? ? ?  Latest Ref Rng & Units 06/28/2021  ?  3:44 AM 06/27/2021  ? 12:49 PM 12/31/2020  ? 12:14 PM  ?BMP  ?Glucose 70 - 99 mg/dL 88   102   94    ?BUN 8 - 23 mg/dL '15   14   9    '$ ?Creatinine 0.61 - 1.24 mg/dL 1.06   1.24   0.80    ?Sodium 135 - 145 mmol/L 140   139   138    ?Potassium 3.5 - 5.1 mmol/L 4.1   3.6   4.1    ?Chloride 98 - 111 mmol/L 108   106   102    ?CO2 22 - 32 mmol/L '24   25   26    '$ ?Calcium 8.9 - 10.3 mg/dL 8.6   8.8   9.3    ?   ? ?NUTRITION - FOCUSED PHYSICAL EXAM: Partial exam due to patient having pants on.  ? ?Flowsheet Row Most Recent Value  ?Orbital Region Mild depletion  ?Upper Arm Region No depletion  ?Thoracic and Lumbar Region No depletion  ?Buccal Region No depletion  [missing teeth]  ?Temple Region Mild depletion   ?Clavicle Bone Region No depletion  ?Clavicle and Acromion Bone Region No depletion  ?Scapular Bone Region No depletion  ?Dorsal Hand No depletion  ?Patellar Region Unable to assess  ?Anterior Thigh Region Unable to assess  ?Posterior Calf Region Unable to assess  ?Edema (RD Assessment) None  ?Hair Reviewed  ?Eyes Reviewed  ?Mouth Reviewed  ?Skin Reviewed  ?Nails Reviewed  ? ?  ? ? ?Diet Order:   ?Diet Order   ? ?       ?  Diet Heart Room service appropriate? Yes; Fluid consistency: Thin  Diet effective now       ?  ? ?  ?  ? ?  ? ? ?EDUCATION NEEDS:  ?Education needs have been addressed ? ?Skin:  Skin Assessment: Reviewed RN Assessment ? ?Last BM:  prior to admission ? ?Height:  ? ?Ht Readings from Last 1 Encounters:  ?06/28/21 '5\' 11"'$  (1.803 m)  ? ? ?Weight:  ? ?Wt Readings from Last 1 Encounters:  ?06/28/21 78.2 kg  ? ? ?Ideal Body Weight:   78 kg ? ?BMI:  Body mass index is 24.03 kg/m?. ? ?Estimated Nutritional Needs:  ? ?Kcal:  2100-2200 ? ?  Protein:  70-75 kg ? ?Fluid:  < 2 liters daily ? ?Colman Cater MS,RD,CSG,LDN ?Contact: AMION ?
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
## 2021-06-28 NOTE — Assessment & Plan Note (Addendum)
-  Acute on chronic combined CHF.  Last echo was in September 2022.  It showed global hypokinesis, and a ventricular ejection fraction of 15 to 20%. ?-Update echo ?-Patient has cardiomegaly on imaging, orthopnea, pleural effusions, dyspnea on exertion ?-Troponin 16, 17 ?-Continue diuresis with 40 mg IV Lasix twice daily ?-Strict intake and output ?-Daily weights ?-Continue home medications for medical optimization including Coreg, Corlanor, Entresto ?-Monitor on telemetry ?

## 2021-06-28 NOTE — TOC Initial Note (Addendum)
Transition of Care (TOC) - Initial/Assessment Note  ? ? ?Patient Details  ?Name: Ryan [name] ?MRN: [id number] ?Date of Birth: 11-15-1950 ? ?Transition of Care (TOC) CM/SW Contact:    ?Shade Flood, LCSW ?Phone Number: ?06/28/2021, 8:57 AM ? ?Clinical Narrative:                 ? ?Pt admitted from home. He has a high readmission risk score. Patient lives with his son. Son provides transportation as needed. Patient goes to the [facility]. He walks with a cane. Pt reports following CHF recommendations. He is established with a cardiologist.  ? ?[place] notification complete. Reference number is [id number] ? ?TOC will follow and assist with dc planning as needed. ? ?Expected Discharge Plan: Home/Self Care ?Barriers to Discharge: Continued Medical Work up ? ? ?Patient Goals and CMS Choice ?Patient states their goals for this hospitalization and ongoing recovery are:: feel better ?  ?  ? ?Expected Discharge Plan and Services ?Expected Discharge Plan: Home/Self Care ?In-house Referral: Clinical Social Work ?  ?  ?Living arrangements for the past 2 months: [place] ?                ?  ?  ?  ?  ?  ?  ?  ?  ?  ?  ? ?Prior Living Arrangements/Services ?Living arrangements for the past 2 months: [place] ?Lives with:: Adult Children ?Patient language and need for interpreter reviewed:: Yes ?Do you feel safe going back to the place where you live?: Yes      ?Need for Family Participation in Patient Care: No (Comment) ?Care giver support system in place?: Yes (comment) ?Current home services: DME ?Criminal Activity/Legal Involvement Pertinent to Current Situation/Hospitalization: No - Comment as needed ? ?Activities of Daily Living ?  ?  ? ?Permission Sought/Granted ?  ?  ?   ?   ?   ?   ? ?Emotional Assessment ?Appearance:: Appears stated age ?  ?  ?Orientation: : Oriented to Self, Oriented to Place, Oriented to  Time, Oriented to Situation ?Alcohol / Substance Use: Not Applicable ?Psych Involvement: No  (comment) ? ?Admission diagnosis:  Epigastric pain [R10.13] ?SOB (shortness of breath) [R06.02] ?Hypoxia [R09.02] ?Acute respiratory failure with hypoxia ([place]) [J96.01] ?Patient Active Problem List  ? Diagnosis Date Noted  ? Acute respiratory failure with hypoxia ([place]) 06/28/2021  ? Enteritis 06/28/2021  ? Abdominal pain 06/28/2021  ? COPD with acute exacerbation ([place]) 12/06/2020  ? Constipation   ? Dilation of biliary tract   ? Dilation of pancreatic duct   ? Anemia   ? Pain   ? Pancreatic pseudocyst   ? Alcohol abuse   ? Pancreatitis 10/25/2020  ? Acute pancreatitis 10/24/2020  ? Chronic HFrEF (heart failure with reduced ejection fraction) ([place])   ? Adenomatous polyp of descending colon   ? Nonischemic cardiomyopathy ([place])   ? Chronic combined systolic and diastolic CHF (congestive heart failure) ([place]) 07/13/2016  ? Alcohol use disorder   ? Hypokalemia 11/27/2015  ? COPD (chronic obstructive pulmonary disease) ([place]) 12/19/2013  ? CKD (chronic kidney disease), stage II 12/03/2013  ? Post-traumatic osteoarthritis of left hip 11/26/2013  ? Post-traumatic osteoarthritis of left knee 11/26/2013  ? Glaucoma 04/23/2013  ? Low back pain 08/29/2010  ? Tobacco use disorder 07/08/2010  ? Essential hypertension 05/16/2010  ? ?PCP:  [facility], [place]:   ?[id number] [place],  - [address] ?[place] ?[place] [address] ?Phone: [phone] Fax: [phone] ? ?[place], [place] ?[place] [address] ?Phone: [phone] Fax: [phone] ? ? ? ? ?Social Determinants of Health (SDOH) Interventions ?  ? ?Readmission Risk Interventions ? ?  12/03/2020  ?  1:04 PM  ?Readmission Risk Prevention Plan  ?Transportation Screening Complete  ?[place] or Home Care Consult Complete  ?Social Work Consult for [place] Planning/Counseling Complete  ?Palliative Care Screening Not Applicable  ?Medication Review Designer, fashion/clothing) Complete  ? ? ? ?

## 2021-06-28 NOTE — ED Notes (Signed)
Urinals emptied ?RN in room ?No additional needs ?

## 2021-06-29 ENCOUNTER — Other Ambulatory Visit: Payer: Self-pay

## 2021-06-29 DIAGNOSIS — I5023 Acute on chronic systolic (congestive) heart failure: Secondary | ICD-10-CM

## 2021-06-29 LAB — BASIC METABOLIC PANEL
Anion gap: 8 (ref 5–15)
BUN: 24 mg/dL — ABNORMAL HIGH (ref 8–23)
CO2: 26 mmol/L (ref 22–32)
Calcium: 8.9 mg/dL (ref 8.9–10.3)
Chloride: 105 mmol/L (ref 98–111)
Creatinine, Ser: 1.26 mg/dL — ABNORMAL HIGH (ref 0.61–1.24)
GFR, Estimated: >60 mL/min (ref >60)
Glucose, Bld: 121 mg/dL — ABNORMAL HIGH (ref 70–99)
Potassium: 3.8 mmol/L (ref 3.5–5.1)
Sodium: 139 mmol/L (ref 135–145)

## 2021-06-29 MED ORDER — HYDROCORTISONE 1 % EX CREA
TOPICAL_CREAM | Freq: Three times a day (TID) | CUTANEOUS | Status: DC
  Administered 2021-06-29 – 2021-07-03 (×3): 1 via TOPICAL
  Filled 2021-06-29 (×3): qty 28.4

## 2021-06-29 MED ORDER — CETAPHIL MOISTURIZING EX LOTN: TOPICAL_LOTION | CUTANEOUS | Status: DC | PRN

## 2021-06-29 MED ORDER — PANTOPRAZOLE SODIUM 40 MG PO TBEC
40.0000 mg | DELAYED_RELEASE_TABLET | Freq: Every day | ORAL | Status: DC
  Administered 2021-06-30 – 2021-07-06 (×7): 40 mg via ORAL
  Filled 2021-06-29 (×7): qty 1

## 2021-06-29 MED ORDER — LUBRIDERM EX LOTN
TOPICAL_LOTION | CUTANEOUS | Status: DC | PRN
  Filled 2021-06-29: qty 1

## 2021-06-29 NOTE — Progress Notes (Signed)
? ?Progress Note ? ?Patient Name: Ryan [name] ?Date of Encounter: 06/29/2021 ? ?[facility] Cardiologist: Bensimhon ? ?Subjective  ? ?SOB improving but not resolved ? ?Inpatient Medications  ?  ?Scheduled Meds: ? carvedilol  3.125 mg Oral BID WC  ? feeding supplement  237 mL Oral BID BM  ? furosemide  40 mg Intravenous BID  ? gabapentin  300 mg Oral BID  ? heparin  5,000 Units Subcutaneous Q8H  ? ivabradine  5 mg Oral BID WC  ? pantoprazole (PROTONIX) IV  40 mg Intravenous Q24H  ? sacubitril-valsartan  1 tablet Oral BID  ? ?Continuous Infusions: ? ?PRN Meds: ?acetaminophen **OR** acetaminophen, albuterol, morphine injection, ondansetron **OR** ondansetron (ZOFRAN) IV, oxyCODONE  ? ?Vital Signs  ?  ?Vitals:  ? 06/28/21 1357 06/28/21 1820 06/28/21 2116 06/29/21 0535  ?BP: (!) 86/69 1'15/74 92/62 95/78 '$  ?Pulse: 92 70 86 91  ?Resp: '18 18 17 20  '$ ?Temp: (!) 97.4 ?F (36.3 ?C)  97.6 ?F (36.4 ?C) 98.2 ?F (36.8 ?C)  ?TempSrc: Oral  Oral   ?SpO2: 97% 97% 100% 97%  ?Weight:    77.3 kg  ?Height:      ? ? ?Intake/Output Summary (Last 24 hours) at 06/29/2021 0840 ?Last data filed at 06/29/2021 0700 ?Gross per 24 hour  ?Intake 1440 ml  ?Output 1300 ml  ?Net 140 ml  ? ? ?  06/29/2021  ?  5:35 AM 06/28/2021  ?  8:35 AM 06/27/2021  ? 12:33 PM  ?Last 3 Weights  ?Weight (lbs) 170 lb 6.7 oz 172 lb 4.8 oz 165 lb 5.5 oz  ?Weight (kg) 77.3 kg 78.155 kg 75 kg  ?   ? ?Telemetry  ?  ?SR - Personally Reviewed ? ?ECG  ?  ?N/a - Personally Reviewed ? ?Physical Exam  ? ?GEN: No acute distress.   ?Neck: elevated JVD ?Cardiac: RRR, no murmurs, rubs, or gallops.  ?Respiratory: decreased breath sounds bilateral bases ?GI: Soft, nontender, non-distended  ?MS: No edema; No deformity. ?Neuro:  Nonfocal  ?Psych: Normal affect  ? ?Labs  ?  ?High Sensitivity Troponin:   ?Recent Labs  ?Lab 06/27/21 ?1249 06/27/21 ?1449  ?TROPONINIHS 16 17  ?   ?Chemistry ?Recent Labs  ?Lab 06/27/21 ?1249 06/27/21 ?2327 06/28/21 ?3762  ?NA 139  --  140  ?K 3.6  --  4.1  ?CL  106  --  108  ?CO2 25  --  24  ?GLUCOSE 102*  --  88  ?BUN 14  --  15  ?CREATININE 1.24  --  1.06  ?CALCIUM 8.8*  --  8.6*  ?MG  --   --  2.3  ?PROT  --  7.1 6.8  ?ALBUMIN  --  3.5 3.3*  ?AST  --  32 31  ?ALT  --  26 25  ?ALKPHOS  --  118 117  ?BILITOT  --  0.8 0.7  ?GFRNONAA >60  --  >60  ?ANIONGAP 8  --  8  ?  ?Lipids No results for input(s): CHOL, TRIG, HDL, LABVLDL, LDLCALC, CHOLHDL in the last 168 hours.  ?Hematology ?Recent Labs  ?Lab 06/27/21 ?1249 06/28/21 ?0713  ?WBC 6.8 6.0  ?RBC 4.40 4.28  ?HGB 12.7* 12.2*  ?HCT 39.9 38.8*  ?MCV 90.7 90.7  ?MCH 28.9 28.5  ?MCHC 31.8 31.4  ?RDW 17.2* 17.0*  ?PLT 257 227  ? ?Thyroid No results for input(s): TSH, FREET4 in the last 168 hours.  ?BNP ?Recent Labs  ?Lab 06/28/21 ?8315  ?BNP  1,701.0*  ?  ?DDimer  ?Recent Labs  ?Lab 06/27/21 ?2327  ?DDIMER 0.81*  ?  ? ?Radiology  ?  ?DG Chest 2 View ? ?Result Date: 06/27/2021 ?CLINICAL DATA:  Chest pain and shortness of breath for several days. Left leg swelling. Lightheadedness. EXAM: CHEST - 2 VIEW COMPARISON:  11/27/2020 FINDINGS: Stable mild-to-moderate cardiomegaly. Low lung volumes again seen. Pleural-parenchymal scarring in the right lower lung remains stable. No evidence of pulmonary infiltrate or edema. IMPRESSION: Stable cardiomegaly and right lower lung scarring. No active lung disease. Electronically Signed   By: Marlaine Hind M.D.   On: 06/27/2021 12:53  ? ?CT Angio Chest PE W and/or Wo Contrast ? ?Result Date: 06/28/2021 ?CLINICAL DATA:  Positive D-dimer, worsening shortness of breath, lightheadedness, history of pancreatitis. EXAM: CT ANGIOGRAPHY CHEST CT ABDOMEN AND PELVIS WITH CONTRAST TECHNIQUE: Multidetector CT imaging of the chest was performed using the standard protocol during bolus administration of intravenous contrast. Multiplanar CT image reconstructions and MIPs were obtained to evaluate the vascular anatomy. Multidetector CT imaging of the abdomen and pelvis was performed using the standard protocol  during bolus administration of intravenous contrast. RADIATION DOSE REDUCTION: This exam was performed according to the departmental dose-optimization program which includes automated exposure control, adjustment of the mA and/or kV according to patient size and/or use of iterative reconstruction technique. CONTRAST:  155m OMNIPAQUE IOHEXOL 350 MG/ML SOLN COMPARISON:  CT chest without contrast 11/23/2015, CT abdomen and pelvis with contrast 12/31/2020. FINDINGS: CTA CHEST FINDINGS Cardiovascular: There is IVC reflux which may be seen with right heart strain, tricuspid regurgitation, and rapid contrast injection. There is moderate to severe panchamber cardiomegaly increased over all of the prior studies. No pericardial effusion is seen. There is left circumflex coronary artery calcification proximally without other visible CAD. There is mild aortic atherosclerosis with a slightly aneurysmal aortic root measuring 4.2 cm, borderline aneurysmal ascending segment 3.9 cm. The pulmonary arteries have increased in prominence since 2017 with the pulmonary trunk 3.4 cm indicating arterial hypertension. No arterial embolic filling defect is seen through fairly abundant breathing motion artifact. Central pulmonary veins are mildly distended Mediastinum/Nodes: The trachea, lower poles of the thyroid, and axillary spaces are unremarkable. There are few slightly prominent right hilar nodes developed since 2017 but no bulky, encasing or further intrathoracic adenopathy. Lungs/Pleura: Increased minimal to small sized layering left pleural effusion. Increased small to moderate-sized right pleural effusion with at least partial loculation medially and extension into the major fissure where it may also be loculated. There is no pneumothorax or pleural thickening. Scattered linear scarring is again present in the lower lobes. There is mild interstitial edema in the lung bases. There is increased bilateral lower lobe bronchial  thickening greater on the right where there several posterior basal small bronchial impactions without visible pneumonic infiltrate. The remaining lungs are generally clear. There is a calcified granuloma in the right middle lobe. No central airway obstruction is seen, with additional bronchial thickening noted in the right upper lobe. Musculoskeletal: Healed fracture deformities are again noted in the right ribcage, anterolateral aspect. There is no spinal compression fracture, no acute or further significant osseous findings. Review of the MIP images confirms the above findings. CT ABDOMEN and PELVIS FINDINGS Hepatobiliary: No focal liver abnormality is seen. There is mild hepatic steatosis. There is mild gallbladder thickening and pericholecystic edema, trace pericholecystic fluid but no calcified stones or biliary dilatation. Pancreas: There are calcifications in the pancreatic head and neck in keeping with chronic calcific pancreatitis. There  is stranding in the pancreaticoduodenal groove which could be related to the pancreas, duodenum or both. Remainder of the peripancreatic edema noted on the prior study is not seen today. Pancreatic duct is chronically prominent but no more than previously. There are increased calcifications in the pancreatic tail. Small pseudocyst in the hypogastrium is seen measuring 1.5 cm, previously 2.2 cm. Other peripancreatic pseudocysts previously seen have resolved. Spleen: No focal abnormality.  No splenomegaly. Adrenals/Urinary Tract: No adrenal masses seen. There is a 1.7 cm stone in the inferior pole right kidney. There are small renal cysts and bilateral cortical thinning. No hydronephrosis, stones or bladder thickening. Stomach/Bowel: Chronic thickened folds in the stomach. There is a tubular device now seen connecting the inferior aspect of the proximal stomach to an underlying jejunal loop. There is increased diffuse thickening of the duodenum. There is no adjacent free air  or inflammation. There are mildly dilated left abdominal small bowel segments up to 3 cm without visible transition, relatively small caliber of the right lower abdominal small bowel, with fluid-filled normal cali

## 2021-06-29 NOTE — Progress Notes (Signed)
?PROGRESS NOTE ? ? ? ?Ryan [name]  QXI:[id number] DOB: Oct 16, 1950 DOA: 06/27/2021 ?PCP: [facility]  ? ?  ?Brief Narrative:  ?Ryan [name] is a 71 y.o. male with medical history significant of combined CHF, CKD, depression, GERD, chronic pancreatitis, history of alcohol abuse, history of polysubstance abuse, hypertension, and more presents the ED with a chief complaint of leg swelling and dyspnea.  Patient reports that this leg swelling started 1 day ago.  He reports that this is never happened to him before.  The left leg is greater than the right leg however he has had extensive injury to the left leg before.  Patient desatted down to 75% in the emergency department.  Patient was admitted with respiratory failure secondary to CHF exacerbation.  Cardiology consulted. ? ?New events last 24 hours / Subjective: ?Patient states that his peripheral edema has improved, remains on 2 L nasal cannula O2.  His documented urine output is 850 mL last 24 hours.  Continues to have some exertional shortness of breath.  Complains of itchy skin that has been ongoing for months.  Has been using lotion at home without improvement. ? ?Assessment & Plan: ?  ? ?Principal Problem: ?  Acute respiratory failure with hypoxia ([place]) ?Active Problems: ?  Essential hypertension ?  Tobacco use disorder ?  Acute on chronic combined systolic and diastolic CHF (congestive heart failure) ([place]) ?  Chronic pancreatitis ([place]) ?  Enteritis ?  Abdominal pain ? ? ?Acute hypoxemic respiratory failure ?-Desatted down to 75% on room air, currently requiring 2 L nasal cannula O2 ?-Likely secondary to heart failure.  CTA noted questionable infectious bronchiolitis.  Patient without any wheezing, fevers, leukocytosis or cough.  Continue to monitor ? ?Acute on chronic combined systolic and diastolic heart failure ?-BNP 1701 ?-Appreciate cardiology ?-Continue IV Lasix ?-Coreg, Corlanor, Entresto - Coreg and entresto held today due to low BP and  HR  ?-Strict I's and O's, daily weight, fluid restriction diet ? ?Chronic pancreatitis ?-Supportive care ? ?Tobacco abuse ?-Cessation counseling ? ?3.9cm ascending aortic aneurysm ?-Annual imaging recommended ? ?Eczema ?-Hydrocortisone and lotion PRN  ? ? ?DVT prophylaxis:  ?heparin injection 5,000 Units Start: 06/28/21 0600 ?SCDs Start: 06/28/21 0425 ? ?Code Status: Full code ?Family Communication: No family at bedside ?Disposition Plan:  ?Status is: Inpatient ?Remains inpatient appropriate because: IV lasix ? ?Antimicrobials:  ?Anti-infectives (From admission, onward)  ? ? None  ? ?  ? ? ? ?Objective: ?Vitals:  ? 06/28/21 1820 06/28/21 2116 06/29/21 0535 06/29/21 0858  ?BP: 1'15/74 92/62 95/78 '$ 101/63  ?Pulse: 70 86 91 (!) 45  ?Resp: '18 17 20   '$ ?Temp:  97.6 ?F (36.4 ?C) 98.2 ?F (36.8 ?C)   ?TempSrc:  Oral    ?SpO2: 97% 100% 97% 100%  ?Weight:   77.3 kg   ?Height:      ? ? ?Intake/Output Summary (Last 24 hours) at 06/29/2021 1232 ?Last data filed at 06/29/2021 0700 ?Gross per 24 hour  ?Intake 960 ml  ?Output 1300 ml  ?Net -340 ml  ? ?Filed Weights  ? 06/27/21 1233 06/28/21 0835 06/29/21 0535  ?Weight: 75 kg 78.2 kg 77.3 kg  ? ? ?Examination:  ?General exam: Appears calm and comfortable  ?Respiratory system: Clear to auscultation. Respiratory effort normal. No respiratory distress. No conversational dyspnea.  On 2 L nasal cannula O2 ?Cardiovascular system: S1 & S2 heard, RRR. No murmurs. No pedal edema. ?Gastrointestinal system: Abdomen is nondistended, soft and nontender. Normal bowel sounds heard. ?  Central nervous system: Alert and oriented. No focal neurological deficits. Speech clear.  ?Extremities: Symmetric in appearance  ?Skin: Excoriated skin of bilateral upper arms ?Psychiatry: Judgement and insight appear normal. Mood & affect appropriate.  ? ?Data Reviewed: I have personally reviewed following labs and imaging studies ? ?CBC: ?Recent Labs  ?Lab 06/27/21 ?1249 06/28/21 ?0713  ?WBC 6.8 6.0  ?NEUTROABS  --  3.5   ?HGB 12.7* 12.2*  ?HCT 39.9 38.8*  ?MCV 90.7 90.7  ?PLT 257 227  ? ?Basic Metabolic Panel: ?Recent Labs  ?Lab 06/27/21 ?1249 06/28/21 ?4967 06/29/21 ?0801  ?NA 139 140 139  ?K 3.6 4.1 3.8  ?CL 106 108 105  ?CO2 '25 24 26  '$ ?GLUCOSE 102* 88 121*  ?BUN 14 15 24*  ?CREATININE 1.24 1.06 1.26*  ?CALCIUM 8.8* 8.6* 8.9  ?MG  --  2.3  --   ? ?GFR: ?Estimated Creatinine Clearance: 57.3 mL/min (A) (by C-G formula based on SCr of 1.26 mg/dL (H)). ?Liver Function Tests: ?Recent Labs  ?Lab 06/27/21 ?2327 06/28/21 ?5916  ?AST 32 31  ?ALT 26 25  ?ALKPHOS 118 117  ?BILITOT 0.8 0.7  ?PROT 7.1 6.8  ?ALBUMIN 3.5 3.3*  ? ?Recent Labs  ?Lab 06/27/21 ?2327  ?LIPASE 27  ? ?No results for input(s): AMMONIA in the last 168 hours. ?Coagulation Profile: ?No results for input(s): INR, PROTIME in the last 168 hours. ?Cardiac Enzymes: ?No results for input(s): CKTOTAL, CKMB, CKMBINDEX, TROPONINI in the last 168 hours. ?BNP (last 3 results) ?No results for input(s): PROBNP in the last 8760 hours. ?HbA1C: ?No results for input(s): HGBA1C in the last 72 hours. ?CBG: ?Recent Labs  ?Lab 06/28/21 ?2119  ?GLUCAP 125*  ? ?Lipid Profile: ?No results for input(s): CHOL, HDL, LDLCALC, TRIG, CHOLHDL, LDLDIRECT in the last 72 hours. ?Thyroid Function Tests: ?No results for input(s): TSH, T4TOTAL, FREET4, T3FREE, THYROIDAB in the last 72 hours. ?Anemia Panel: ?No results for input(s): VITAMINB12, FOLATE, FERRITIN, TIBC, IRON, RETICCTPCT in the last 72 hours. ?Sepsis Labs: ?No results for input(s): PROCALCITON, LATICACIDVEN in the last 168 hours. ? ?No results found for this or any previous visit (from the past 240 hour(s)).  ? ? ?Radiology Studies: ?DG Chest 2 View ? ?Result Date: 06/27/2021 ?CLINICAL DATA:  Chest pain and shortness of breath for several days. Left leg swelling. Lightheadedness. EXAM: CHEST - 2 VIEW COMPARISON:  11/27/2020 FINDINGS: Stable mild-to-moderate cardiomegaly. Low lung volumes again seen. Pleural-parenchymal scarring in the right  lower lung remains stable. No evidence of pulmonary infiltrate or edema. IMPRESSION: Stable cardiomegaly and right lower lung scarring. No active lung disease. Electronically Signed   By: Marlaine Hind M.D.   On: 06/27/2021 12:53  ? ?CT Angio Chest PE W and/or Wo Contrast ? ?Result Date: 06/28/2021 ?CLINICAL DATA:  Positive D-dimer, worsening shortness of breath, lightheadedness, history of pancreatitis. EXAM: CT ANGIOGRAPHY CHEST CT ABDOMEN AND PELVIS WITH CONTRAST TECHNIQUE: Multidetector CT imaging of the chest was performed using the standard protocol during bolus administration of intravenous contrast. Multiplanar CT image reconstructions and MIPs were obtained to evaluate the vascular anatomy. Multidetector CT imaging of the abdomen and pelvis was performed using the standard protocol during bolus administration of intravenous contrast. RADIATION DOSE REDUCTION: This exam was performed according to the departmental dose-optimization program which includes automated exposure control, adjustment of the mA and/or kV according to patient size and/or use of iterative reconstruction technique. CONTRAST:  114m OMNIPAQUE IOHEXOL 350 MG/ML SOLN COMPARISON:  CT chest without contrast 11/23/2015, CT abdomen and  pelvis with contrast 12/31/2020. FINDINGS: CTA CHEST FINDINGS Cardiovascular: There is IVC reflux which may be seen with right heart strain, tricuspid regurgitation, and rapid contrast injection. There is moderate to severe panchamber cardiomegaly increased over all of the prior studies. No pericardial effusion is seen. There is left circumflex coronary artery calcification proximally without other visible CAD. There is mild aortic atherosclerosis with a slightly aneurysmal aortic root measuring 4.2 cm, borderline aneurysmal ascending segment 3.9 cm. The pulmonary arteries have increased in prominence since 2017 with the pulmonary trunk 3.4 cm indicating arterial hypertension. No arterial embolic filling defect is  seen through fairly abundant breathing motion artifact. Central pulmonary veins are mildly distended Mediastinum/Nodes: The trachea, lower poles of the thyroid, and axillary spaces are unremarkable. There

## 2021-06-29 NOTE — Progress Notes (Signed)
Tele reported that patient experienced a 9 beat run of VT. Patient was up going to the bathroom and felt very short winded as he has on and off all day. Vitals and EKG obtained and MD aware.  ?

## 2021-06-30 LAB — BASIC METABOLIC PANEL
Anion gap: 7 (ref 5–15)
BUN: 25 mg/dL — ABNORMAL HIGH (ref 8–23)
CO2: 27 mmol/L (ref 22–32)
Calcium: 8.9 mg/dL (ref 8.9–10.3)
Chloride: 103 mmol/L (ref 98–111)
Creatinine, Ser: 1.23 mg/dL (ref 0.61–1.24)
GFR, Estimated: >60 mL/min (ref >60)
Glucose, Bld: 91 mg/dL (ref 70–99)
Potassium: 3.7 mmol/L (ref 3.5–5.1)
Sodium: 137 mmol/L (ref 135–145)

## 2021-06-30 LAB — BRAIN NATRIURETIC PEPTIDE: B Natriuretic Peptide: 1629 pg/mL — ABNORMAL HIGH (ref 0.0–100.0)

## 2021-06-30 LAB — MAGNESIUM: Magnesium: 2.3 mg/dL (ref 1.7–2.4)

## 2021-06-30 MED ORDER — METOPROLOL SUCCINATE ER 25 MG PO TB24
12.5000 mg | ORAL_TABLET | Freq: Every day | ORAL | Status: DC
  Administered 2021-07-02: 12.5 mg via ORAL
  Filled 2021-06-30 (×4): qty 1

## 2021-06-30 MED ORDER — POTASSIUM CHLORIDE CRYS ER 20 MEQ PO TBCR
40.0000 meq | EXTENDED_RELEASE_TABLET | Freq: Once | ORAL | Status: AC
  Administered 2021-06-30: 40 meq via ORAL
  Filled 2021-06-30: qty 2

## 2021-06-30 MED ORDER — FUROSEMIDE 10 MG/ML IJ SOLN
60.0000 mg | Freq: Two times a day (BID) | INTRAMUSCULAR | Status: AC
  Administered 2021-06-30: 60 mg via INTRAVENOUS
  Filled 2021-06-30 (×2): qty 6

## 2021-06-30 MED ORDER — MELATONIN 3 MG PO TABS
6.0000 mg | ORAL_TABLET | Freq: Once | ORAL | Status: AC
  Administered 2021-06-30: 6 mg via ORAL
  Filled 2021-06-30: qty 2

## 2021-06-30 MED ORDER — FUROSEMIDE 10 MG/ML IJ SOLN
INTRAMUSCULAR | Status: AC
Start: 2021-06-30 — End: 2021-06-30
  Administered 2021-06-30: 60 mg via INTRAVENOUS
  Filled 2021-06-30: qty 8

## 2021-06-30 NOTE — Progress Notes (Signed)
? ?Progress Note ? ?Patient Name: Ryan [name] ?Date of Encounter: 06/30/2021 ? ?[facility] Cardiologist: Bensimhon ? ?Subjective  ? ?Ongoing SOB ? ?Inpatient Medications  ?  ?Scheduled Meds: ? feeding supplement  237 mL Oral BID BM  ? gabapentin  300 mg Oral BID  ? heparin  5,000 Units Subcutaneous Q8H  ? hydrocortisone cream   Topical TID  ? ivabradine  5 mg Oral BID WC  ? pantoprazole  40 mg Oral Daily  ? ?Continuous Infusions: ? ?PRN Meds: ?acetaminophen **OR** acetaminophen, albuterol, Lubriderm, ondansetron **OR** ondansetron (ZOFRAN) IV, oxyCODONE  ? ?Vital Signs  ?  ?Vitals:  ? 06/29/21 1521 06/29/21 2128 06/30/21 0449 06/30/21 0500  ?BP:  97/74 97/69   ?Pulse: 87 84 84   ?Resp:  20 18   ?Temp:  97.8 ?F (36.6 ?C) 98.6 ?F (37 ?C)   ?TempSrc:      ?SpO2: 100% 100% 98%   ?Weight:    78.7 kg  ?Height:      ? ? ?Intake/Output Summary (Last 24 hours) at 06/30/2021 0846 ?Last data filed at 06/30/2021 0700 ?Gross per 24 hour  ?Intake 480 ml  ?Output 1000 ml  ?Net -520 ml  ? ? ?  06/30/2021  ?  5:00 AM 06/29/2021  ?  5:35 AM 06/28/2021  ?  8:35 AM  ?Last 3 Weights  ?Weight (lbs) 173 lb 8 oz 170 lb 6.7 oz 172 lb 4.8 oz  ?Weight (kg) 78.7 kg 77.3 kg 78.155 kg  ?   ? ?Telemetry  ?  ?SR, runs of NSVT - Personally Reviewed ? ?ECG  ?  ?N/a - Personally Reviewed ? ?Physical Exam  ? ?GEN: No acute distress.   ?Neck: elevated JVD ?Cardiac: RRR, no murmurs, rubs, or gallops.  ?Respiratory: decreased breath sounds bilateral bases ?GI: Soft, nontender, non-distended  ?MS: No edema; No deformity. ?Neuro:  Nonfocal  ?Psych: Normal affect  ? ?Labs  ?  ?High Sensitivity Troponin:   ?Recent Labs  ?Lab 06/27/21 ?1249 06/27/21 ?1449  ?TROPONINIHS 16 17  ?   ?Chemistry ?Recent Labs  ?Lab 06/27/21 ?2327 06/28/21 ?3335 06/29/21 ?0801 06/30/21 ?0440  ?NA  --  140 139 137  ?K  --  4.1 3.8 3.7  ?CL  --  108 105 103  ?CO2  --  '24 26 27  '$ ?GLUCOSE  --  88 121* 91  ?BUN  --  15 24* 25*  ?CREATININE  --  1.06 1.26* 1.23  ?CALCIUM  --  8.6* 8.9  8.9  ?MG  --  2.3  --  2.3  ?PROT 7.1 6.8  --   --   ?ALBUMIN 3.5 3.3*  --   --   ?AST 32 31  --   --   ?ALT 26 25  --   --   ?ALKPHOS 118 117  --   --   ?BILITOT 0.8 0.7  --   --   ?GFRNONAA  --  >60 >60 >60  ?ANIONGAP  --  '8 8 7  '$ ?  ?Lipids No results for input(s): CHOL, TRIG, HDL, LABVLDL, LDLCALC, CHOLHDL in the last 168 hours.  ?Hematology ?Recent Labs  ?Lab 06/27/21 ?1249 06/28/21 ?0713  ?WBC 6.8 6.0  ?RBC 4.40 4.28  ?HGB 12.7* 12.2*  ?HCT 39.9 38.8*  ?MCV 90.7 90.7  ?MCH 28.9 28.5  ?MCHC 31.8 31.4  ?RDW 17.2* 17.0*  ?PLT 257 227  ? ?Thyroid No results for input(s): TSH, FREET4 in the last 168 hours.  ?BNP ?Recent  Labs  ?Lab 06/28/21 ?4332 06/30/21 ?0440  ?BNP 1,701.0* 1,629.0*  ?  ?DDimer  ?Recent Labs  ?Lab 06/27/21 ?2327  ?DDIMER 0.81*  ?  ? ?Radiology  ?  ?US Venous Img Lower Unilateral Left (DVT) ? ?Result Date: 06/28/2021 ?CLINICAL DATA:  Left lower extremity edema EXAM: LEFT LOWER EXTREMITY VENOUS DOPPLER ULTRASOUND TECHNIQUE: Gray-scale sonography with graded compression, as well as color Doppler and duplex ultrasound were performed to evaluate the lower extremity deep venous systems from the level of the common femoral vein and including the common femoral, femoral, profunda femoral, popliteal and calf veins including the posterior tibial, peroneal and gastrocnemius veins when visible. The superficial great saphenous vein was also interrogated. Spectral Doppler was utilized to evaluate flow at rest and with distal augmentation maneuvers in the common femoral, femoral and popliteal veins. COMPARISON:  None. FINDINGS: Contralateral Common Femoral Vein: Respiratory phasicity is normal and symmetric with the symptomatic side. No evidence of thrombus. Normal compressibility. Common Femoral Vein: No evidence of thrombus. Normal compressibility, respiratory phasicity and response to augmentation. Saphenofemoral Junction: No evidence of thrombus. Normal compressibility and flow on color Doppler imaging.  Profunda Femoral Vein: No evidence of thrombus. Normal compressibility and flow on color Doppler imaging. Femoral Vein: No evidence of thrombus. Normal compressibility, respiratory phasicity and response to augmentation. Popliteal Vein: No evidence of thrombus. Normal compressibility, respiratory phasicity and response to augmentation. Calf Veins: No evidence of thrombus. Normal compressibility and flow on color Doppler imaging. Superficial Great Saphenous Vein: No evidence of thrombus. Normal compressibility. Venous Reflux:  None. Other Findings:  None. IMPRESSION: No evidence of left lower extremity deep venous thrombosis. Electronically Signed   By: Davina Poke D.O.   On: 06/28/2021 10:58   ? ?Cardiac Studies  ? ? ? ?Patient Profile  ?   ?   ?Ryan [name] is a 71 y.o. male with a hx of HFrEF (EF 15-20% in 2012, 35-40% in 04/2013, cath in 11/23018 showing normal cors and mild pulmonary HTN, EF 15-20% in 04/2018, 11/2020 and 01/2021), HTN, Stage 3 CKD, tobacco use, history of alcohol abuse and chronic pancreatitis (s/p biliary stent) who is being seen 06/28/2021 for the evaluation of CHF at the request of Dr. Bonner Puna. ? ?Assessment & Plan  ?  ?1.Acute on chronic HFrEF ?- 11/2020 echo LVEF 95-18%, indet diastolic, normal RV ?- 84/1660 echo Carrilion: LVEF 15-20% ?- followed closely in CHFclinic ?- recently medical therapy has been limited by low bp's ?- outpatient medical therapy with coreg 3.'125mg'$  bid, corlanor '5mg'$ , entresto 24/'26mg'$  bid. Aldactone stopped due to low bp's during last HF clinicvisit, considering SGLT2i in near future per HF note ?  ?presents volume overloaded. Elevated JVD, mild crackles on exma, BNP up to 1701 ?- I/Os incomplete this admit, weights appear inaccurate. Has been roughly 170s this admit, during 04/2021 HF appt was 160 lbs. BNP 1701-->1629 today, 7 months ago was 531. Ongoing O2 requirement.  Mild uptrend in Cr, likely will have to accept higher Cr in order to diurese. Dose IV lasix  '60mg'$  bid today and reasess in AM ? ?- soft bp's, appears his coreg and entresto were held. With NSVT start back toprol 12.'5mg'$  daily, can continue to hold entresto for now.  ? ? ?2.NSVT ?- in setting of severe systolic dysfunction, beta blocker held yesterday due to low bp's ?- change coreg to toprol 12.'5mg'$  daily ?- K 3.7, Mg 2.3. Will give 4mq KCl today ?  ?3.Chronic pancreatitis ?- per primary team ? ?For questions  or updates, please contact [place] ?Please consult [email or web address] for contact info under  ? ?  ?   ?Signed, ?Carlyle Dolly, MD  ?06/30/2021, 8:46 AM   ? ?

## 2021-06-30 NOTE — Progress Notes (Signed)
Pt started on 2 liters of o2 to whin to RA per  new order. Pt stable. Lying in bed with his eye open. CALL BELL IN [place].  ?

## 2021-06-30 NOTE — Progress Notes (Signed)
?PROGRESS NOTE ? ? ? ?MAKALE PINDELL  LFY:[id number] DOB: [birth year] DOA: 06/27/2021 ?PCP: [facility] [place]  ? ?  ?Brief Narrative:  ?Ryan [name] is a 71 y.o. male with medical history significant of combined CHF, CKD, depression, GERD, chronic pancreatitis, history of alcohol abuse, history of polysubstance abuse, hypertension, and more presents the ED with a chief complaint of leg swelling and dyspnea.  Patient reports that this leg swelling started 1 day ago.  He reports that this is never happened to him before.  The left leg is greater than the right leg however he has had extensive injury to the left leg before.  Patient desatted down to 75% in the emergency department.  Patient was admitted with respiratory failure secondary to CHF exacerbation.  Cardiology consulted. ? ?New events last 24 hours / Subjective: ?Patient reports not feeling great, admits to some lightheadedness when getting out of bed.  Coreg and Entresto were held due to low BP and heart rate.  Remains on 2 L oxygen.  Urine output documented 1000 mL yesterday.  Continues to have shortness of breath. ? ?Assessment & Plan: ?  ? ?Principal Problem: ?  Acute respiratory failure with hypoxia ([place]) ?Active Problems: ?  Essential hypertension ?  Tobacco use disorder ?  Acute on chronic combined systolic and diastolic CHF (congestive heart failure) ([place]) ?  Chronic pancreatitis ([place]) ?  Enteritis ?  Abdominal pain ? ? ?Acute hypoxemic respiratory failure ?-Desatted down to 75% on room air, currently requiring 2 L nasal cannula O2 ?-Likely secondary to heart failure.  CTA noted questionable infectious bronchiolitis.  Patient without any wheezing, fevers, leukocytosis or cough.  Continue to monitor ?-Wean to room air as tolerated ? ?Acute on chronic combined systolic and diastolic heart failure ?-BNP 1701 ?-Appreciate cardiology ?-Continue IV Lasix ?-Toprol started today.  Hold Entresto ? ?Chronic pancreatitis ?-Supportive care ? ?Tobacco  abuse ?-Cessation counseling ? ?3.9cm ascending aortic aneurysm ?-Annual imaging recommended ? ?Eczema ?-Hydrocortisone and lotion PRN  ? ? ?DVT prophylaxis:  ?heparin injection 5,000 Units Start: 06/28/21 0600 ?SCDs Start: 06/28/21 0425 ? ?Code Status: Full code ?Family Communication: No family at bedside ?Disposition Plan:  ?Status is: Inpatient ?Remains inpatient appropriate because: IV lasix ? ?Antimicrobials:  ?Anti-infectives (From admission, onward)  ? ? None  ? ?  ? ? ? ?Objective: ?Vitals:  ? 06/29/21 1521 06/29/21 2128 06/30/21 0449 06/30/21 0500  ?BP:  97/74 97/69   ?Pulse: 87 84 84   ?Resp:  20 18   ?Temp:  97.8 ?F (36.6 ?C) 98.6 ?F (37 ?C)   ?TempSrc:      ?SpO2: 100% 100% 98%   ?Weight:    78.7 kg  ?Height:      ? ? ?Intake/Output Summary (Last 24 hours) at 06/30/2021 0956 ?Last data filed at 06/30/2021 0900 ?Gross per 24 hour  ?Intake 720 ml  ?Output 1000 ml  ?Net -280 ml  ? ? ?Filed Weights  ? 06/28/21 0835 06/29/21 0535 06/30/21 0500  ?Weight: 78.2 kg 77.3 kg 78.7 kg  ? ? ?Examination:  ?General exam: Appears calm and comfortable  ?Respiratory system: Clear to auscultation. Respiratory effort normal. No respiratory distress. No conversational dyspnea.  On 2 L nasal cannula O2 ?Cardiovascular system: S1 & S2 heard, RRR. No murmurs.  Trace pedal edema. ?Gastrointestinal system: Abdomen is nondistended, soft and nontender. Normal bowel sounds heard. ?Central nervous system: Alert and oriented. No focal neurological deficits. Speech clear.  ?Extremities: Symmetric in appearance  ?Skin: Excoriated  skin of bilateral upper arms ?Psychiatry: Judgement and insight appear normal. Mood & affect appropriate.  ? ?Data Reviewed: I have personally reviewed following labs and imaging studies ? ?CBC: ?Recent Labs  ?Lab 06/27/21 ?1249 06/28/21 ?0713  ?WBC 6.8 6.0  ?NEUTROABS  --  3.5  ?HGB 12.7* 12.2*  ?HCT 39.9 38.8*  ?MCV 90.7 90.7  ?PLT 257 227  ? ? ?Basic Metabolic Panel: ?Recent Labs  ?Lab 06/27/21 ?1249  06/28/21 ?3976 06/29/21 ?0801 06/30/21 ?0440  ?NA 139 140 139 137  ?K 3.6 4.1 3.8 3.7  ?CL 106 108 105 103  ?CO2 '25 24 26 27  '$ ?GLUCOSE 102* 88 121* 91  ?BUN 14 15 24* 25*  ?CREATININE 1.24 1.06 1.26* 1.23  ?CALCIUM 8.8* 8.6* 8.9 8.9  ?MG  --  2.3  --  2.3  ? ? ?GFR: ?Estimated Creatinine Clearance: 58.7 mL/min (by C-G formula based on SCr of 1.23 mg/dL). ?Liver Function Tests: ?Recent Labs  ?Lab 06/27/21 ?2327 06/28/21 ?7341  ?AST 32 31  ?ALT 26 25  ?ALKPHOS 118 117  ?BILITOT 0.8 0.7  ?PROT 7.1 6.8  ?ALBUMIN 3.5 3.3*  ? ? ?Recent Labs  ?Lab 06/27/21 ?2327  ?LIPASE 27  ? ? ?No results for input(s): AMMONIA in the last 168 hours. ?Coagulation Profile: ?No results for input(s): INR, PROTIME in the last 168 hours. ?Cardiac Enzymes: ?No results for input(s): CKTOTAL, CKMB, CKMBINDEX, TROPONINI in the last 168 hours. ?BNP (last 3 results) ?No results for input(s): PROBNP in the last 8760 hours. ?HbA1C: ?No results for input(s): HGBA1C in the last 72 hours. ?CBG: ?Recent Labs  ?Lab 06/28/21 ?2119  ?GLUCAP 125*  ? ? ?Lipid Profile: ?No results for input(s): CHOL, HDL, LDLCALC, TRIG, CHOLHDL, LDLDIRECT in the last 72 hours. ?Thyroid Function Tests: ?No results for input(s): TSH, T4TOTAL, FREET4, T3FREE, THYROIDAB in the last 72 hours. ?Anemia Panel: ?No results for input(s): VITAMINB12, FOLATE, FERRITIN, TIBC, IRON, RETICCTPCT in the last 72 hours. ?Sepsis Labs: ?No results for input(s): PROCALCITON, LATICACIDVEN in the last 168 hours. ? ?No results found for this or any previous visit (from the past 240 hour(s)).  ? ? ?Radiology Studies: ?US Venous Img Lower Unilateral Left (DVT) ? ?Result Date: 06/28/2021 ?CLINICAL DATA:  Left lower extremity edema EXAM: LEFT LOWER EXTREMITY VENOUS DOPPLER ULTRASOUND TECHNIQUE: Gray-scale sonography with graded compression, as well as color Doppler and duplex ultrasound were performed to evaluate the lower extremity deep venous systems from the level of the common femoral vein and including  the common femoral, femoral, profunda femoral, popliteal and calf veins including the posterior tibial, peroneal and gastrocnemius veins when visible. The superficial great saphenous vein was also interrogated. Spectral Doppler was utilized to evaluate flow at rest and with distal augmentation maneuvers in the common femoral, femoral and popliteal veins. COMPARISON:  None. FINDINGS: Contralateral Common Femoral Vein: Respiratory phasicity is normal and symmetric with the symptomatic side. No evidence of thrombus. Normal compressibility. Common Femoral Vein: No evidence of thrombus. Normal compressibility, respiratory phasicity and response to augmentation. Saphenofemoral Junction: No evidence of thrombus. Normal compressibility and flow on color Doppler imaging. Profunda Femoral Vein: No evidence of thrombus. Normal compressibility and flow on color Doppler imaging. Femoral Vein: No evidence of thrombus. Normal compressibility, respiratory phasicity and response to augmentation. Popliteal Vein: No evidence of thrombus. Normal compressibility, respiratory phasicity and response to augmentation. Calf Veins: No evidence of thrombus. Normal compressibility and flow on color Doppler imaging. Superficial Great Saphenous Vein: No evidence of thrombus. Normal compressibility. Venous  Reflux:  None. Other Findings:  None. IMPRESSION: No evidence of left lower extremity deep venous thrombosis. Electronically Signed   By: Davina Poke D.O.   On: 06/28/2021 10:58   ? ? ? ?Scheduled Meds: ? feeding supplement  237 mL Oral BID BM  ? furosemide  60 mg Intravenous BID  ? gabapentin  300 mg Oral BID  ? heparin  5,000 Units Subcutaneous Q8H  ? hydrocortisone cream   Topical TID  ? ivabradine  5 mg Oral BID WC  ? metoprolol succinate  12.5 mg Oral Daily  ? pantoprazole  40 mg Oral Daily  ? potassium chloride  40 mEq Oral Once  ? ?Continuous Infusions: ? ? LOS: 2 days  ? ? ? ?Dessa Phi, DO ?[facility] ?06/30/2021, 9:56 AM   ? ?Available via Epic secure chat 7am-7pm ?After these hours, please refer to coverage provider listed on [email or web address] ? ?

## 2021-07-01 LAB — BASIC METABOLIC PANEL
Anion gap: 9 (ref 5–15)
BUN: 27 mg/dL — ABNORMAL HIGH (ref 8–23)
CO2: 27 mmol/L (ref 22–32)
Calcium: 9 mg/dL (ref 8.9–10.3)
Chloride: 102 mmol/L (ref 98–111)
Creatinine, Ser: 1.14 mg/dL (ref 0.61–1.24)
GFR, Estimated: >60 mL/min (ref >60)
Glucose, Bld: 119 mg/dL — ABNORMAL HIGH (ref 70–99)
Potassium: 3.6 mmol/L (ref 3.5–5.1)
Sodium: 138 mmol/L (ref 135–145)

## 2021-07-01 MED ORDER — METOLAZONE 5 MG PO TABS
2.5000 mg | ORAL_TABLET | Freq: Once | ORAL | Status: AC
  Administered 2021-07-01: 2.5 mg via ORAL
  Filled 2021-07-01: qty 1

## 2021-07-01 MED ORDER — FUROSEMIDE 10 MG/ML IJ SOLN
80.0000 mg | Freq: Two times a day (BID) | INTRAMUSCULAR | Status: DC
  Administered 2021-07-01 – 2021-07-02 (×3): 80 mg via INTRAVENOUS
  Filled 2021-07-01 (×4): qty 8

## 2021-07-01 MED ORDER — SENNOSIDES-DOCUSATE SODIUM 8.6-50 MG PO TABS: 1.0000 | ORAL_TABLET | Freq: Every evening | ORAL | Status: DC | PRN

## 2021-07-01 MED ORDER — METHOCARBAMOL 1000 MG/10ML IJ SOLN
500.0000 mg | Freq: Three times a day (TID) | INTRAVENOUS | Status: DC | PRN
  Administered 2021-07-01: 500 mg via INTRAVENOUS
  Filled 2021-07-01: qty 500

## 2021-07-01 NOTE — Progress Notes (Signed)
?PROGRESS NOTE ? ? ? ?Ryan [name]  TMH:[id number] DOB: 03-Sep-1950 DOA: 06/27/2021 ?PCP: [facility] [place]  ? ?  ?Brief Narrative:  ?Ryan [name] is a 71 y.o. male with medical history significant of combined CHF, CKD, depression, GERD, chronic pancreatitis, history of alcohol abuse, history of polysubstance abuse, hypertension, and more presents the ED with a chief complaint of leg swelling and dyspnea.  Patient reports that this leg swelling started 1 day ago.  He reports that this is never happened to him before.  The left leg is greater than the right leg however he has had extensive injury to the left leg before.  Patient desatted down to 75% in the emergency department.  Patient was admitted with respiratory failure secondary to CHF exacerbation.  Cardiology consulted. ? ?New events last 24 hours / Subjective: ?Continues to complain of shortness of breath.  Urine output documented 2200 mL yesterday.  Some lightheadedness when getting out of bed. ? ?Assessment & Plan: ?  ? ?Principal Problem: ?  Acute respiratory failure with hypoxia ([place]) ?Active Problems: ?  Essential hypertension ?  Tobacco use disorder ?  Acute on chronic combined systolic and diastolic CHF (congestive heart failure) ([place]) ?  Chronic pancreatitis ([place]) ?  Enteritis ?  Abdominal pain ? ? ?Acute hypoxemic respiratory failure ?-Desatted down to 75% on room air, currently requiring 2 L nasal cannula O2 ?-Likely secondary to heart failure.  CTA noted questionable infectious bronchiolitis.  Patient without any wheezing, fevers, leukocytosis or cough.  Continue to monitor ?-Wean to room air as tolerated ? ?Acute on chronic combined systolic and diastolic heart failure ?-BNP 1701 ?-Appreciate cardiology ?-Toprol, Corlanor.  Hold Entresto ?-Defer further Lasix dose to cardiology ? ?Chronic pancreatitis ?-Supportive care ? ?Tobacco abuse ?-Cessation counseling ? ?3.9cm ascending aortic aneurysm ?-Annual imaging  recommended ? ?Eczema ?-Hydrocortisone and lotion PRN  ? ? ?DVT prophylaxis:  ?heparin injection 5,000 Units Start: 06/28/21 0600 ?SCDs Start: 06/28/21 0425 ? ?Code Status: Full code ?Family Communication: No family at bedside ?Disposition Plan:  ?Status is: Inpatient ?Remains inpatient appropriate because: CHF exacerbation ? ?Antimicrobials:  ?Anti-infectives (From admission, onward)  ? ? None  ? ?  ? ? ? ?Objective: ?Vitals:  ? 06/30/21 1822 06/30/21 2107 07/01/21 0513 07/01/21 9211  ?BP:  105/72 96/73   ?Pulse:  82 76   ?Resp:  19 19   ?Temp:  97.9 ?F (36.6 ?C) 97.6 ?F (36.4 ?C)   ?TempSrc:  Oral Oral   ?SpO2: 97% 100% 100%   ?Weight:    76.7 kg  ?Height:      ? ? ?Intake/Output Summary (Last 24 hours) at 07/01/2021 1056 ?Last data filed at 07/01/2021 9417 ?Gross per 24 hour  ?Intake 720 ml  ?Output 2300 ml  ?Net -1580 ml  ? ? ?Filed Weights  ? 06/29/21 0535 06/30/21 0500 07/01/21 0607  ?Weight: 77.3 kg 78.7 kg 76.7 kg  ? ? ?Examination:  ?General exam: Appears calm and comfortable  ?Respiratory system: Clear to auscultation. Respiratory effort normal. No respiratory distress. No conversational dyspnea.  On 2 L nasal cannula O2 ?Cardiovascular system: S1 & S2 heard, RRR. No murmurs.  Trace pedal edema. ?Gastrointestinal system: Abdomen is nondistended, soft and nontender. Normal bowel sounds heard. ?Central nervous system: Alert and oriented. No focal neurological deficits. Speech clear.  ?Extremities: Symmetric in appearance  ?Skin: Excoriated skin of bilateral upper arms ?Psychiatry: Judgement and insight appear normal. Mood & affect appropriate.  ? ?Data Reviewed: I have personally reviewed  following labs and imaging studies ? ?CBC: ?Recent Labs  ?Lab 06/27/21 ?1249 06/28/21 ?0713  ?WBC 6.8 6.0  ?NEUTROABS  --  3.5  ?HGB 12.7* 12.2*  ?HCT 39.9 38.8*  ?MCV 90.7 90.7  ?PLT 257 227  ? ? ?Basic Metabolic Panel: ?Recent Labs  ?Lab 06/27/21 ?1249 06/28/21 ?8101 06/29/21 ?0801 06/30/21 ?7510 07/01/21 ?0439  ?NA 139 140  139 137 138  ?K 3.6 4.1 3.8 3.7 3.6  ?CL 106 108 105 103 102  ?CO2 '25 24 26 27 27  '$ ?GLUCOSE 102* 88 121* 91 119*  ?BUN 14 15 24* 25* 27*  ?CREATININE 1.24 1.06 1.26* 1.23 1.14  ?CALCIUM 8.8* 8.6* 8.9 8.9 9.0  ?MG  --  2.3  --  2.3  --   ? ? ?GFR: ?Estimated Creatinine Clearance: 63.3 mL/min (by C-G formula based on SCr of 1.14 mg/dL). ?Liver Function Tests: ?Recent Labs  ?Lab 06/27/21 ?2327 06/28/21 ?2585  ?AST 32 31  ?ALT 26 25  ?ALKPHOS 118 117  ?BILITOT 0.8 0.7  ?PROT 7.1 6.8  ?ALBUMIN 3.5 3.3*  ? ? ?Recent Labs  ?Lab 06/27/21 ?2327  ?LIPASE 27  ? ? ?No results for input(s): AMMONIA in the last 168 hours. ?Coagulation Profile: ?No results for input(s): INR, PROTIME in the last 168 hours. ?Cardiac Enzymes: ?No results for input(s): CKTOTAL, CKMB, CKMBINDEX, TROPONINI in the last 168 hours. ?BNP (last 3 results) ?No results for input(s): PROBNP in the last 8760 hours. ?HbA1C: ?No results for input(s): HGBA1C in the last 72 hours. ?CBG: ?Recent Labs  ?Lab 06/28/21 ?2119  ?GLUCAP 125*  ? ? ?Lipid Profile: ?No results for input(s): CHOL, HDL, LDLCALC, TRIG, CHOLHDL, LDLDIRECT in the last 72 hours. ?Thyroid Function Tests: ?No results for input(s): TSH, T4TOTAL, FREET4, T3FREE, THYROIDAB in the last 72 hours. ?Anemia Panel: ?No results for input(s): VITAMINB12, FOLATE, FERRITIN, TIBC, IRON, RETICCTPCT in the last 72 hours. ?Sepsis Labs: ?No results for input(s): PROCALCITON, LATICACIDVEN in the last 168 hours. ? ?No results found for this or any previous visit (from the past 240 hour(s)).  ? ? ?Radiology Studies: ?No results found. ? ? ? ?Scheduled Meds: ? feeding supplement  237 mL Oral BID BM  ? gabapentin  300 mg Oral BID  ? heparin  5,000 Units Subcutaneous Q8H  ? hydrocortisone cream   Topical TID  ? ivabradine  5 mg Oral BID WC  ? metoprolol succinate  12.5 mg Oral Daily  ? pantoprazole  40 mg Oral Daily  ? ?Continuous Infusions: ? ? LOS: 3 days  ? ? ? ?Dessa Phi, DO ?[facility] ?07/01/2021, 10:56 AM   ? ?Available via Epic secure chat 7am-7pm ?After these hours, please refer to coverage provider listed on [email or web address] ? ?

## 2021-07-01 NOTE — Progress Notes (Signed)
? ?Progress Note ? ?Patient Name: Ryan [name] ?Date of Encounter: 07/01/2021 ? ?[facility] Cardiologist: Bensimhon ? ?Subjective  ?Still SOB  Breathing is Up and down   Stomach is still tender ? ?Inpatient Medications  ?  ?Scheduled Meds: ? feeding supplement  237 mL Oral BID BM  ? gabapentin  300 mg Oral BID  ? heparin  5,000 Units Subcutaneous Q8H  ? hydrocortisone cream   Topical TID  ? ivabradine  5 mg Oral BID WC  ? metoprolol succinate  12.5 mg Oral Daily  ? pantoprazole  40 mg Oral Daily  ? ?Continuous Infusions: ? ?PRN Meds:  ? ?Vital Signs  ?  ?Vitals:  ? 06/30/21 1822 06/30/21 2107 07/01/21 0513 07/01/21 4034  ?BP:  105/72 96/73   ?Pulse:  82 76   ?Resp:  19 19   ?Temp:  97.9 ?F (36.6 ?C) 97.6 ?F (36.4 ?C)   ?TempSrc:  Oral Oral   ?SpO2: 97% 100% 100%   ?Weight:    76.7 kg  ?Height:      ? ? ?Intake/Output Summary (Last 24 hours) at 07/01/2021 1156 ?Last data filed at 07/01/2021 7425 ?Gross per 24 hour  ?Intake 720 ml  ?Output 2300 ml  ?Net -1580 ml  ?  Net neg 1.6 L ?  ? ?  07/01/2021  ?  6:07 AM 06/30/2021  ?  5:00 AM 06/29/2021  ?  5:35 AM  ?Last 3 Weights  ?Weight (lbs) 169 lb 173 lb 8 oz 170 lb 6.7 oz  ?Weight (kg) 76.658 kg 78.7 kg 77.3 kg  ?   ? ?Telemetry  ?  ?SR,==18 sec Wide complex, possible idioventricular  - Personally Reviewed ? ?ECG  ?  ?N/a - Personally Reviewed ? ?Physical Exam  ? ?GEN: No acute distress.   ?Neck: JVP elevated  ?Cardiac: RRR, no murmurs,  ?Respiratory: oving air  NO rales  ?GI: Soft, nontender, non-distended  ?MS: No edema; No deformity. ?Neuro:  Nonfocal  ?Psych: Normal affect  ? ?Labs  ?  ?High Sensitivity Troponin:   ?Recent Labs  ?Lab 06/27/21 ?1249 06/27/21 ?1449  ?TROPONINIHS 16 17  ?   ?Chemistry ?Recent Labs  ?Lab 06/27/21 ?2327 06/28/21 ?9563 06/29/21 ?0801 06/30/21 ?8756 07/01/21 ?0439  ?NA  --  140 139 137 138  ?K  --  4.1 3.8 3.7 3.6  ?CL  --  108 105 103 102  ?CO2  --  '24 26 27 27  '$ ?GLUCOSE  --  88 121* 91 119*  ?BUN  --  15 24* 25* 27*  ?CREATININE  --   1.06 1.26* 1.23 1.14  ?CALCIUM  --  8.6* 8.9 8.9 9.0  ?MG  --  2.3  --  2.3  --   ?PROT 7.1 6.8  --   --   --   ?ALBUMIN 3.5 3.3*  --   --   --   ?AST 32 31  --   --   --   ?ALT 26 25  --   --   --   ?ALKPHOS 118 117  --   --   --   ?BILITOT 0.8 0.7  --   --   --   ?GFRNONAA  --  >60 >60 >60 >60  ?ANIONGAP  --  '8 8 7 9  '$ ?  ?Lipids No results for input(s): CHOL, TRIG, HDL, LABVLDL, LDLCALC, CHOLHDL in the last 168 hours.  ?Hematology ?Recent Labs  ?Lab 06/27/21 ?1249 06/28/21 ?0713  ?WBC 6.8 6.0  ?  RBC 4.40 4.28  ?HGB 12.7* 12.2*  ?HCT 39.9 38.8*  ?MCV 90.7 90.7  ?MCH 28.9 28.5  ?MCHC 31.8 31.4  ?RDW 17.2* 17.0*  ?PLT 257 227  ? ?Thyroid No results for input(s): TSH, FREET4 in the last 168 hours.  ?BNP ?Recent Labs  ?Lab 06/28/21 ?6712 06/30/21 ?0440  ?BNP 1,701.0* 1,629.0*  ?  ?DDimer  ?Recent Labs  ?Lab 06/27/21 ?2327  ?DDIMER 0.81*  ?  ? ?Radiology  ?  ?No results found. ? ?Cardiac Studies  ? ? ? ?Patient Profile  ?   ?   ?Ryan [name] is a 71 y.o. male with a hx of HFrEF (EF 15-20% in 2012, 35-40% in 04/2013, cath in 11/23018 showing normal cors and mild pulmonary HTN, EF 15-20% in 04/2018, 11/2020 and 01/2021), HTN, Stage 3 CKD, tobacco use, history of alcohol abuse and chronic pancreatitis (s/p biliary stent) who is being seen 06/28/2021 for the evaluation of CHF at the request of Dr. Bonner Puna. ? ?Assessment & Plan  ?  ?1.Acute on chronic HFrEF ?- 11/2020 echo LVEF 45-80%, indet diastolic, normal RV ?- 99/8338 echo Carrilion: LVEF 15-20% ?- followed closely in CHFclinic  Last seen in march  ? ?- recently medical therapy has been limited by low bp's ?- outpatient medical therapy with coreg 3.'125mg'$  bid, corlanor '5mg'$ , entresto 24/'26mg'$  bid. Aldactone stopped due to low bp's during last HF clinicvisit, considering SGLT2i in near future per HF note ?  ?Pt presented with  volume overload. BNP up to 1701 ? ?Still with cough, SOB and increased JVP   I would increase lasix to 80 bid   Add Zaroxylyn x 1 to today's PM dose  (2.5 mg)  ? ?Besides Toprol and Corlanor, he is only on diuretic   BP is soft   limts titration   ? ? ?2.NSVT ?- Keep on toprol    Watch electrolytes     ?  ?3.Chronic pancreatitis ?- per primary team ? ?For questions or updates, please contact [place] ?Please consult [email or web address] for contact info under  ? ?  ?   ?Signed, ?Dorris Carnes, MD  ?07/01/2021, 11:56 AM   ? ?

## 2021-07-02 LAB — BASIC METABOLIC PANEL
Anion gap: 10 (ref 5–15)
BUN: 26 mg/dL — ABNORMAL HIGH (ref 8–23)
CO2: 29 mmol/L (ref 22–32)
Calcium: 9.5 mg/dL (ref 8.9–10.3)
Chloride: 99 mmol/L (ref 98–111)
Creatinine, Ser: 1.05 mg/dL (ref 0.61–1.24)
GFR, Estimated: >60 mL/min (ref >60)
Glucose, Bld: 87 mg/dL (ref 70–99)
Potassium: 3.9 mmol/L (ref 3.5–5.1)
Sodium: 138 mmol/L (ref 135–145)

## 2021-07-02 LAB — GLUCOSE, CAPILLARY: Glucose-Capillary: 107 mg/dL — ABNORMAL HIGH (ref 70–99)

## 2021-07-02 MED ORDER — NALOXONE HCL 0.4 MG/ML IJ SOLN
INTRAMUSCULAR | Status: AC
  Filled 2021-07-02: qty 1

## 2021-07-02 MED ORDER — POTASSIUM CHLORIDE CRYS ER 20 MEQ PO TBCR
40.0000 meq | EXTENDED_RELEASE_TABLET | Freq: Once | ORAL | Status: AC
  Administered 2021-07-02: 40 meq via ORAL
  Filled 2021-07-02: qty 2

## 2021-07-02 NOTE — Progress Notes (Signed)
?PROGRESS NOTE ? ? ? ?Ryan [name]  BSW:[id number] DOB: 01-19-1951 DOA: 06/27/2021 ?PCP: [facility] [place]  ? ?  ?Brief Narrative:  ?Ryan [name] is a 71 y.o. male with medical history significant of combined CHF, CKD, depression, GERD, chronic pancreatitis, history of alcohol abuse, history of polysubstance abuse, hypertension, and more presents the ED with a chief complaint of leg swelling and dyspnea.  Patient reports that this leg swelling started 1 day ago.  He reports that this is never happened to him before.  The left leg is greater than the right leg however he has had extensive injury to the left leg before.  Patient desatted down to 75% in the emergency department.  Patient was admitted with respiratory failure secondary to CHF exacerbation.  Cardiology consulted. ? ?New events last 24 hours / Subjective: ?Feeling slightly better today. UOP 3858m documented yesterday.  ? ?Assessment & Plan: ?  ? ?Principal Problem: ?  Ac[place] respiratory [place] with [place] ([place] ?Active Problems: ?  Essential hypertension ?  Tobacco use disorder ?  Acute on chronic combined systolic and diastolic CHF (congestive heart failure) ([place] ?  Chronic pancreatitis ([place] ?  Enteritis ?  Abdominal pain ? ? ?Acute hypoxemic respiratory failure ?-Desatted down to 75% on room air, currently requiring 2 L nasal cannula O2 ?-Likely secondary to heart failure.  CTA noted questionable infectious bronchiolitis.  Patient without any wheezing, fevers, leukocytosis or cough.  Continue to monitor ?-Wean to room air as tolerated ? ?Acute on chronic combined systolic and diastolic heart failure ?-BNP 1701 ?-Appreciate cardiology ?-Toprol, Corlanor.  Hold Entresto ?-Remains on lasix. Got zaroxolyn yesterday.  ? ?Chronic pancreatitis ?-Supportive care ? ?Tobacco abuse ?-Cessation counseling ? ?3.9cm ascending aortic aneurysm ?-Annual imaging recommended ? ?Eczema ?-Hydrocortisone and lotion PRN  ? ?Hypokalemia ?-Replace  ? ? ?DVT  prophylaxis:  ?heparin injection 5,000 Units Start: 06/28/21 0600 ?SCDs Start: 06/28/21 0425 ? ?Code Status: Full code ?Family Communication: No family at bedside ?Disposition Plan:  ?Status is: Inpatient ?Remains inpatient appropriate because: CHF exacerbation ? ?Antimicrobials:  ?Anti-infectives (From admission, onward)  ? ? None  ? ?  ? ? ? ?Objective: ?Vitals:  ? 07/01/21 2108 07/02/21 0518 07/02/21 0466504/22/23 0835  ?BP: 98/71 91/72  98/67  ?Pulse: 83 90  88  ?Resp: 19 19    ?Temp: 97.9 ?F (36.6 ?C) 98.1 ?F (36.7 ?C)    ?TempSrc: Oral Oral    ?SpO2: 100% 100%    ?Weight:   74.3 kg   ?Height:      ? ? ?Intake/Output Summary (Last 24 hours) at 07/02/2021 1209 ?Last data filed at 07/02/2021 0426 ?Gross per 24 hour  ?Intake 410 ml  ?Output 3700 ml  ?Net -3290 ml  ? ? ?Filed Weights  ? 06/30/21 0500 07/01/21 0607 07/02/21 0651  ?Weight: 78.7 kg 76.7 kg 74.3 kg  ? ? ?Examination:  ?General exam: Appears calm and comfortable  ?Respiratory system: Clear to auscultation. Respiratory effort normal. No respiratory distress. No conversational dyspnea.  On 2 L nasal cannula O2 ?Cardiovascular system: S1 & S2 heard, RRR. No murmurs.  Trace pedal edema. ?Gastrointestinal system: Abdomen is nondistended, soft and nontender. Normal bowel sounds heard. ?Central nervous system: Alert and oriented. No focal neurological deficits. Speech clear.  ?Extremities: Symmetric in appearance  ?Skin: Excoriated skin of bilateral upper arms ?Psychiatry: Judgement and insight appear normal. Mood & affect appropriate.  ? ?Data Reviewed: I have personally reviewed following labs and imaging studies ? ?CBC: ?Recent  Labs  ?Lab 06/27/21 ?1249 06/28/21 ?0713  ?WBC 6.8 6.0  ?NEUTROABS  --  3.5  ?HGB 12.7* 12.2*  ?HCT 39.9 38.8*  ?MCV 90.7 90.7  ?PLT 257 227  ? ? ?Basic Metabolic Panel: ?Recent Labs  ?Lab 06/28/21 ?1610 06/29/21 ?0801 06/30/21 ?9604 07/01/21 ?5409 07/02/21 ?8119  ?NA 140 139 137 138 138  ?K 4.1 3.8 3.7 3.6 3.9  ?CL 108 105 103 102 99   ?CO2 '24 26 27 27 29  '$ ?GLUCOSE 88 121* 91 119* 87  ?BUN 15 24* 25* 27* 26*  ?CREATININE 1.06 1.26* 1.23 1.14 1.05  ?CALCIUM 8.6* 8.9 8.9 9.0 9.5  ?MG 2.3  --  2.3  --   --   ? ? ?GFR: ?Estimated Creatinine Clearance: 67.8 mL/min (by C-G formula based on SCr of 1.05 mg/dL). ?Liver Function Tests: ?Recent Labs  ?Lab 06/27/21 ?2327 06/28/21 ?1478  ?AST 32 31  ?ALT 26 25  ?ALKPHOS 118 117  ?BILITOT 0.8 0.7  ?PROT 7.1 6.8  ?ALBUMIN 3.5 3.3*  ? ? ?Recent Labs  ?Lab 06/27/21 ?2327  ?LIPASE 27  ? ? ?No results for input(s): AMMONIA in the last 168 hours. ?Coagulation Profile: ?No results for input(s): INR, PROTIME in the last 168 hours. ?Cardiac Enzymes: ?No results for input(s): CKTOTAL, CKMB, CKMBINDEX, TROPONINI in the last 168 hours. ?BNP (last 3 results) ?No results for input(s): PROBNP in the last 8760 hours. ?HbA1C: ?No results for input(s): HGBA1C in the last 72 hours. ?CBG: ?Recent Labs  ?Lab 06/28/21 ?2119 07/02/21 ?1119  ?GLUCAP 125* 107*  ? ? ?Lipid Profile: ?No results for input(s): CHOL, HDL, LDLCALC, TRIG, CHOLHDL, LDLDIRECT in the last 72 hours. ?Thyroid Function Tests: ?No results for input(s): TSH, T4TOTAL, FREET4, T3FREE, THYROIDAB in the last 72 hours. ?Anemia Panel: ?No results for input(s): VITAMINB12, FOLATE, FERRITIN, TIBC, IRON, RETICCTPCT in the last 72 hours. ?Sepsis Labs: ?No results for input(s): PROCALCITON, LATICACIDVEN in the last 168 hours. ? ?No results found for this or any previous visit (from the past 240 hour(s)).  ? ? ?Radiology Studies: ?No results found. ? ? ? ?Scheduled Meds: ? feeding supplement  237 mL Oral BID BM  ? furosemide  80 mg Intravenous BID  ? gabapentin  300 mg Oral BID  ? heparin  5,000 Units Subcutaneous Q8H  ? hydrocortisone cream   Topical TID  ? ivabradine  5 mg Oral BID WC  ? metoprolol succinate  12.5 mg Oral Daily  ? naloxone      ? pantoprazole  40 mg Oral Daily  ? ?Continuous Infusions: ? methocarbamol (ROBAXIN) IV 500 mg (07/01/21 1648)  ? ? ? LOS: 4 days   ? ? ? ?Dessa Phi, DO ?[facility] ?07/02/2021, 12:09 PM  ? ?Available via Epic secure chat 7am-7pm ?After these hours, please refer to coverage provider listed on [email or web address] ? ?

## 2021-07-03 LAB — BASIC METABOLIC PANEL
Anion gap: 11 (ref 5–15)
BUN: 34 mg/dL — ABNORMAL HIGH (ref 8–23)
CO2: 29 mmol/L (ref 22–32)
Calcium: 9.3 mg/dL (ref 8.9–10.3)
Chloride: 96 mmol/L — ABNORMAL LOW (ref 98–111)
Creatinine, Ser: 1.26 mg/dL — ABNORMAL HIGH (ref 0.61–1.24)
GFR, Estimated: >60 mL/min (ref >60)
Glucose, Bld: 91 mg/dL (ref 70–99)
Potassium: 3.5 mmol/L (ref 3.5–5.1)
Sodium: 136 mmol/L (ref 135–145)

## 2021-07-03 MED ORDER — TRAMADOL HCL 50 MG PO TABS
50.0000 mg | ORAL_TABLET | Freq: Four times a day (QID) | ORAL | Status: DC | PRN
  Administered 2021-07-03 (×2): 50 mg via ORAL
  Filled 2021-07-03 (×2): qty 1

## 2021-07-03 NOTE — Progress Notes (Signed)
?PROGRESS NOTE ? ? ? ?Ryan [name]  WEX:[id number] DOB: 1950-09-05 DOA: 06/27/2021 ?PCP: [facility] [place]  ? ?  ?Brief Narrative:  ?Ryan [name] is a 71 y.o. male with medical history significant of combined CHF, CKD, depression, GERD, chronic pancreatitis, history of alcohol abuse, history of polysubstance abuse, hypertension, and more presents the ED with a chief complaint of leg swelling and dyspnea.  Patient reports that this leg swelling started 1 day ago.  He reports that this is never happened to him before.  The left leg is greater than the right leg however he has had extensive injury to the left leg before.  Patient desatted down to 75% in the emergency department.  Patient was admitted with respiratory failure secondary to CHF exacerbation.  Cardiology consulted. ? ?New events last 24 hours / Subjective: ?On room air this morning.  Documented urine output 3173m yesterday.  Blood pressure remains low 86/65 today.  Lasix and Toprol held ? ?Assessment & Plan: ?  ? ?Principal Problem: ?  Acute respiratory failure with hypoxia ([place] ?Active Problems: ?  Essential hypertension ?  Tobacco use disorder ?  Acute on chronic combined systolic and diastolic CHF (congestive heart failure) ([place] ?  Chronic pancreatitis ([place] ?  Enteritis ?  Abdominal pain ? ? ?Acute hypoxemic respiratory failure ?-Desatted down to 75% on room air, currently requiring 2 L nasal cannula O2 ?-Likely secondary to heart failure.  CTA noted questionable infectious bronchiolitis.  Patient without any wheezing, fevers, leukocytosis or cough.  Continue to monitor ?-On room air this morning ? ?Acute on chronic combined systolic and diastolic heart failure ?-BNP 1701 ?-Appreciate cardiology ?-Has been diuresed with Lasix.  Hold on further Lasix due to hypotension ?-Hold Toradol, Entresto due to hypotension ?-Continue Corlanor ? ?Chronic pancreatitis ?-Supportive care ? ?Tobacco abuse ?-Cessation counseling ? ?3.9cm ascending aortic  aneurysm ?-Annual imaging recommended ? ?Eczema ?-Hydrocortisone and lotion PRN  ? ? ? ? ?DVT prophylaxis:  ?heparin injection 5,000 Units Start: 06/28/21 0600 ?SCDs Start: 06/28/21 0425 ? ?Code Status: Full code ?Family Communication: No family at bedside ?Disposition Plan:  ?Status is: Inpatient ?Remains inpatient appropriate because: CHF exacerbation ? ?Antimicrobials:  ?Anti-infectives (From admission, onward)  ? ? None  ? ?  ? ? ? ?Objective: ?Vitals:  ? 07/02/21 1700 07/02/21 2109 07/03/21 0502 07/03/21 0600  ?BP:  92/64 (!) 86/65   ?Pulse:  78 80   ?Resp:  20 20   ?Temp:  98.4 ?F (36.9 ?C) 97.9 ?F (36.6 ?C)   ?TempSrc:  Oral Oral   ?SpO2: 100% 100% 97%   ?Weight:    72.6 kg  ?Height:      ? ? ?Intake/Output Summary (Last 24 hours) at 07/03/2021 1102 ?Last data filed at 07/03/2021 0900 ?Gross per 24 hour  ?Intake 590 ml  ?Output 3100 ml  ?Net -2510 ml  ? ? ?Filed Weights  ? 07/01/21 0607 07/02/21 0651 07/03/21 0600  ?Weight: 76.7 kg 74.3 kg 72.6 kg  ? ? ?Examination:  ?General exam: Appears calm and comfortable  ?Respiratory system: Clear to auscultation. Respiratory effort normal. No respiratory distress. No conversational dyspnea.  On room air ?Cardiovascular system: S1 & S2 heard, RRR. No murmurs.  Trace pedal edema. ?Gastrointestinal system: Abdomen is nondistended, soft and nontender. Normal bowel sounds heard. ?Central nervous system: Alert and oriented ?Extremities: Symmetric in appearance  ?Psychiatry: Judgement and insight appear normal. Mood & affect appropriate.  ? ?Data Reviewed: I have personally reviewed following labs and imaging  studies ? ?CBC: ?Recent Labs  ?Lab 06/27/21 ?1249 06/28/21 ?0713  ?WBC 6.8 6.0  ?NEUTROABS  --  3.5  ?HGB 12.7* 12.2*  ?HCT 39.9 38.8*  ?MCV 90.7 90.7  ?PLT 257 227  ? ? ?Basic Metabolic Panel: ?Recent Labs  ?Lab 06/28/21 ?2620 06/29/21 ?0801 06/30/21 ?3559 07/01/21 ?7416 07/02/21 ?3845 07/03/21 ?0454  ?NA 140 139 137 138 138 136  ?K 4.1 3.8 3.7 3.6 3.9 3.5  ?CL 108 105  103 102 99 96*  ?CO2 '24 26 27 27 29 29  '$ ?GLUCOSE 88 121* 91 119* 87 91  ?BUN 15 24* 25* 27* 26* 34*  ?CREATININE 1.06 1.26* 1.23 1.14 1.05 1.26*  ?CALCIUM 8.6* 8.9 8.9 9.0 9.5 9.3  ?MG 2.3  --  2.3  --   --   --   ? ? ?GFR: ?Estimated Creatinine Clearance: 55.2 mL/min (A) (by C-G formula based on SCr of 1.26 mg/dL (H)). ?Liver Function Tests: ?Recent Labs  ?Lab 06/27/21 ?2327 06/28/21 ?3646  ?AST 32 31  ?ALT 26 25  ?ALKPHOS 118 117  ?BILITOT 0.8 0.7  ?PROT 7.1 6.8  ?ALBUMIN 3.5 3.3*  ? ? ?Recent Labs  ?Lab 06/27/21 ?2327  ?LIPASE 27  ? ? ?No results for input(s): AMMONIA in the last 168 hours. ?Coagulation Profile: ?No results for input(s): INR, PROTIME in the last 168 hours. ?Cardiac Enzymes: ?No results for input(s): CKTOTAL, CKMB, CKMBINDEX, TROPONINI in the last 168 hours. ?BNP (last 3 results) ?No results for input(s): PROBNP in the last 8760 hours. ?HbA1C: ?No results for input(s): HGBA1C in the last 72 hours. ?CBG: ?Recent Labs  ?Lab 06/28/21 ?2119 07/02/21 ?1119  ?GLUCAP 125* 107*  ? ? ?Lipid Profile: ?No results for input(s): CHOL, HDL, LDLCALC, TRIG, CHOLHDL, LDLDIRECT in the last 72 hours. ?Thyroid Function Tests: ?No results for input(s): TSH, T4TOTAL, FREET4, T3FREE, THYROIDAB in the last 72 hours. ?Anemia Panel: ?No results for input(s): VITAMINB12, FOLATE, FERRITIN, TIBC, IRON, RETICCTPCT in the last 72 hours. ?Sepsis Labs: ?No results for input(s): PROCALCITON, LATICACIDVEN in the last 168 hours. ? ?No results found for this or any previous visit (from the past 240 hour(s)).  ? ? ?Radiology Studies: ?No results found. ? ? ? ?Scheduled Meds: ? feeding supplement  237 mL Oral BID BM  ? gabapentin  300 mg Oral BID  ? heparin  5,000 Units Subcutaneous Q8H  ? hydrocortisone cream   Topical TID  ? ivabradine  5 mg Oral BID WC  ? pantoprazole  40 mg Oral Daily  ? ?Continuous Infusions: ? methocarbamol (ROBAXIN) IV 500 mg (07/01/21 1648)  ? ? ? LOS: 5 days  ? ? ? ?Dessa Phi, DO ?[facility] ?07/03/2021, 11:02 AM  ? ?Available via Epic secure chat 7am-7pm ?After these hours, please refer to coverage provider listed on [email or web address] ? ?

## 2021-07-04 DIAGNOSIS — I951 Orthostatic hypotension: Secondary | ICD-10-CM

## 2021-07-04 LAB — BASIC METABOLIC PANEL
Anion gap: 7 (ref 5–15)
BUN: 32 mg/dL — ABNORMAL HIGH (ref 8–23)
CO2: 29 mmol/L (ref 22–32)
Calcium: 9.1 mg/dL (ref 8.9–10.3)
Chloride: 100 mmol/L (ref 98–111)
Creatinine, Ser: 1.12 mg/dL (ref 0.61–1.24)
GFR, Estimated: >60 mL/min (ref >60)
Glucose, Bld: 75 mg/dL (ref 70–99)
Potassium: 3.4 mmol/L — ABNORMAL LOW (ref 3.5–5.1)
Sodium: 136 mmol/L (ref 135–145)

## 2021-07-04 MED ORDER — FUROSEMIDE 20 MG PO TABS
20.0000 mg | ORAL_TABLET | Freq: Every day | ORAL | Status: DC
  Administered 2021-07-04 – 2021-07-06 (×3): 20 mg via ORAL
  Filled 2021-07-04 (×3): qty 1

## 2021-07-04 MED ORDER — POTASSIUM CHLORIDE CRYS ER 20 MEQ PO TBCR
40.0000 meq | EXTENDED_RELEASE_TABLET | Freq: Once | ORAL | Status: AC
  Administered 2021-07-04: 40 meq via ORAL
  Filled 2021-07-04: qty 2

## 2021-07-04 MED ORDER — MIDODRINE HCL 5 MG PO TABS
5.0000 mg | ORAL_TABLET | Freq: Two times a day (BID) | ORAL | Status: DC
  Administered 2021-07-04 – 2021-07-05 (×2): 5 mg via ORAL
  Filled 2021-07-04 (×2): qty 1

## 2021-07-04 NOTE — Progress Notes (Signed)
? ?Progress Note ? ?Patient Name: Ryan [name] ?Date of Encounter: 07/04/2021 ? ?Primary Cardiologist: None  ? ?Subjective  ? ?Nows that he is still having orthostatic hypotension.  He has no sx lying flat, dizzy when sitting up. ?Notes that he has persistent SOB. ? ?Inpatient Medications  ?  ?Scheduled Meds: ? feeding supplement  237 mL Oral BID BM  ? gabapentin  300 mg Oral BID  ? heparin  5,000 Units Subcutaneous Q8H  ? hydrocortisone cream   Topical TID  ? ivabradine  5 mg Oral BID WC  ? pantoprazole  40 mg Oral Daily  ? ?Continuous Infusions: ? methocarbamol (ROBAXIN) IV 500 mg (07/01/21 1648)  ? ?PRN Meds: ?acetaminophen **OR** acetaminophen, albuterol, Lubriderm, methocarbamol (ROBAXIN) IV, ondansetron **OR** ondansetron (ZOFRAN) IV, senna-docusate, traMADol  ? ?Vital Signs  ?  ?Vitals:  ? 07/03/21 2150 07/04/21 0510 07/04/21 0557 07/04/21 0837  ?BP: 100/74 (!) 89/63  105/74  ?Pulse: 83 81  82  ?Resp: 18 18    ?Temp: 97.7 ?F (36.5 ?C) (!) 97.5 ?F (36.4 ?C)    ?TempSrc: Oral Oral    ?SpO2: 100% 100%  97%  ?Weight:   74.2 kg   ?Height:      ? ? ?Intake/Output Summary (Last 24 hours) at 07/04/2021 1005 ?Last data filed at 07/04/2021 0900 ?Gross per 24 hour  ?Intake 720 ml  ?Output 2200 ml  ?Net -1480 ml  ? ?Filed Weights  ? 07/02/21 0651 07/03/21 0600 07/04/21 0557  ?Weight: 74.3 kg 72.6 kg 74.2 kg  ? ? ?Telemetry  ?  ?SR with occasional PVCs - Personally Reviewed ? ?Physical Exam  ? ?Gen: no distress, thin elderly male   ?Neck: JVD 8 cm elevated on 30 degree evaluation ?Cardiac: No Rubs or Gallops, no Murmur, RRR +2 radial pulses ?Respiratory: Clear to auscultation bilaterally, normal effort, normal  respiratory rate ?GI: Soft, nontender, non-distended  ?MS: No  edema;  moves all extremities, clubbing of fingers noted ?Integument: Skin feels warm ?Neuro:  At time of evaluation, alert and oriented to person/place/time/situation  ?Psych: Normal affect, patient feels well laying done ? ? ?Labs  ?   ?Chemistry ?Recent Labs  ?Lab 06/27/21 ?2327 06/28/21 ?2229 06/29/21 ?7989 07/02/21 ?2119 07/03/21 ?4174 07/04/21 ?0814  ?NA  --  140   < > 138 136 136  ?K  --  4.1   < > 3.9 3.5 3.4*  ?CL  --  108   < > 99 96* 100  ?CO2  --  24   < > '29 29 29  '$ ?GLUCOSE  --  88   < > 87 91 75  ?BUN  --  15   < > 26* 34* 32*  ?CREATININE  --  1.06   < > 1.05 1.26* 1.12  ?CALCIUM  --  8.6*   < > 9.5 9.3 9.1  ?PROT 7.1 6.8  --   --   --   --   ?ALBUMIN 3.5 3.3*  --   --   --   --   ?AST 32 31  --   --   --   --   ?ALT 26 25  --   --   --   --   ?ALKPHOS 118 117  --   --   --   --   ?BILITOT 0.8 0.7  --   --   --   --   ?GFRNONAA  --  >60   < > >60 >60 >60  ?  ANIONGAP  --  8   < > '10 11 7  '$ ? < > = values in this interval not displayed.  ?  ? ?Hematology ?Recent Labs  ?Lab 06/27/21 ?1249 06/28/21 ?0713  ?WBC 6.8 6.0  ?RBC 4.40 4.28  ?HGB 12.7* 12.2*  ?HCT 39.9 38.8*  ?MCV 90.7 90.7  ?MCH 28.9 28.5  ?MCHC 31.8 31.4  ?RDW 17.2* 17.0*  ?PLT 257 227  ? ? ?Cardiac EnzymesNo results for input(s): TROPONINI in the last 168 hours. No results for input(s): TROPIPOC in the last 168 hours.  ? ?BNP ?Recent Labs  ?Lab 06/28/21 ?6578 06/30/21 ?0440  ?BNP 1,701.0* 1,629.0*  ?  ? ?DDimer  ?Recent Labs  ?Lab 06/27/21 ?2327  ?DDIMER 0.81*  ?  ? ?Radiology  ?  ?No results found. ? ?Patient Profile  ?   ?71 y.o. male End stage heart failure ? ?Assessment & Plan  ?  ?Acute on Chronic stage D HFrEF ?CKD Stabe IIIb ?Chronic pancreatitis ?Former Alcohol abuse, former poly substance abuse rare tobaccouse ?HTN-> Orthostatic hypotension ?- presently he will not tolerate GDMT:  continue ivabradine but his Coreg, and ARNI, and ARB have been held and he has note started SGLT2i because of low BP ?- even with supine this AM, he has not be able to tolerate diuresis and he is still volume overloaded. ?- I will start lasix 20 mg PO daily and midodrine 5 mg PO BID ?- we have discussed my concerns that his hypotension may be a marker of end stage HF; he is not a candidate  for AHF therapies ? ? ?For questions or updates, please contact [place] ?Please consult [email or web address] for contact info under Cardiology/STEMI. ?  ?   ?Signed, ?Werner Lean, MD  ?07/04/2021, 10:05 AM   ? ?

## 2021-07-04 NOTE — Progress Notes (Signed)
?PROGRESS NOTE ? ? ? ?Ryan [name]  UMP:[id number] DOB: 1950-06-10 DOA: 06/27/2021 ?PCP: [facility] [place]  ? ?  ?Brief Narrative:  ?Ryan [name] is a 71 y.o. male with medical history significant of combined CHF, CKD, depression, GERD, chronic pancreatitis, history of alcohol abuse, history of polysubstance abuse, hypertension, and more presents the ED with a chief complaint of leg swelling and dyspnea.  Patient reports that this leg swelling started 1 day ago.  He reports that this is never happened to him before.  The left leg is greater than the right leg however he has had extensive injury to the left leg before.  Patient desatted down to 75% in the emergency department.  Patient was admitted with respiratory failure secondary to CHF exacerbation.  Cardiology consulted. ? ?New events last 24 hours / Subjective: ?No new complaints, blood pressure remains low 89/60.  Urine output 2200 mL yesterday.  Still not feeling like his breathing is back to baseline. ? ?Assessment & Plan: ?  ? ?Principal Problem: ?  Acute respiratory failure with hypoxia ([place]) ?Active Problems: ?  Essential hypertension ?  Tobacco use disorder ?  Acute on chronic combined systolic and diastolic CHF (congestive heart failure) ([place]) ?  Chronic pancreatitis ([place]) ?  Enteritis ?  Abdominal pain ? ? ?Acute hypoxemic respiratory failure ?-Desatted down to 75% on room air, and required nasal cannula O2 ?-Likely secondary to heart failure ?-On room air this morning ? ?Acute on chronic combined systolic and diastolic heart failure ?-BNP 1701 ?-Appreciate cardiology ?-Medical therapy limited due to hypotension.  Cardiology has started him on Lasix and midodrine ?-Continue Corlanor ? ?Chronic pancreatitis ?-Supportive care ? ?Tobacco abuse ?-Cessation counseling ? ?3.9cm ascending aortic aneurysm ?-Annual imaging recommended ? ?Eczema ?-Hydrocortisone and lotion PRN  ? ?Hypokalemia ?-Replace ? ? ? ?DVT prophylaxis:  ?heparin injection 5,000  Units Start: 06/28/21 0600 ?SCDs Start: 06/28/21 0425 ? ?Code Status: Full code ?Family Communication: No family at bedside ?Disposition Plan:  ?Status is: Inpatient ?Remains inpatient appropriate because: CHF exacerbation ? ?Antimicrobials:  ?Anti-infectives (From admission, onward)  ? ? None  ? ?  ? ? ? ?Objective: ?Vitals:  ? 07/03/21 2150 07/04/21 0510 07/04/21 0557 07/04/21 0837  ?BP: 100/74 (!) 89/63  105/74  ?Pulse: 83 81  82  ?Resp: 18 18    ?Temp: 97.7 ?F (36.5 ?C) (!) 97.5 ?F (36.4 ?C)    ?TempSrc: Oral Oral    ?SpO2: 100% 100%  97%  ?Weight:   74.2 kg   ?Height:      ? ? ?Intake/Output Summary (Last 24 hours) at 07/04/2021 1024 ?Last data filed at 07/04/2021 0900 ?Gross per 24 hour  ?Intake 720 ml  ?Output 2200 ml  ?Net -1480 ml  ? ? ?Filed Weights  ? 07/02/21 0651 07/03/21 0600 07/04/21 0557  ?Weight: 74.3 kg 72.6 kg 74.2 kg  ? ? ?Examination:  ?General exam: Appears calm and comfortable  ?Respiratory system: Clear to auscultation. Respiratory effort normal. No respiratory distress. No conversational dyspnea.  On room air ?Cardiovascular system: S1 & S2 heard, RRR. No murmurs.  Trace pedal edema. ?Gastrointestinal system: Abdomen is nondistended, soft and nontender. Normal bowel sounds heard. ?Central nervous system: Alert and oriented ?Extremities: Symmetric in appearance  ?Psychiatry: Judgement and insight appear normal. Mood & affect appropriate.  ? ?Data Reviewed: I have personally reviewed following labs and imaging studies ? ?CBC: ?Recent Labs  ?Lab 06/27/21 ?1249 06/28/21 ?0713  ?WBC 6.8 6.0  ?NEUTROABS  --  3.5  ?HGB 12.7* 12.2*  ?HCT 39.9 38.8*  ?MCV 90.7 90.7  ?PLT 257 227  ? ? ?Basic Metabolic Panel: ?Recent Labs  ?Lab 06/28/21 ?1062 06/29/21 ?0801 06/30/21 ?6948 07/01/21 ?5462 07/02/21 ?7035 07/03/21 ?0093 07/04/21 ?8182  ?NA 140   < > 137 138 138 136 136  ?K 4.1   < > 3.7 3.6 3.9 3.5 3.4*  ?CL 108   < > 103 102 99 96* 100  ?CO2 24   < > '27 27 29 29 29  '$ ?GLUCOSE 88   < > 91 119* 87 91 75  ?BUN  15   < > 25* 27* 26* 34* 32*  ?CREATININE 1.06   < > 1.23 1.14 1.05 1.26* 1.12  ?CALCIUM 8.6*   < > 8.9 9.0 9.5 9.3 9.1  ?MG 2.3  --  2.3  --   --   --   --   ? < > = values in this interval not displayed.  ? ? ?GFR: ?Estimated Creatinine Clearance: 63.5 mL/min (by C-G formula based on SCr of 1.12 mg/dL). ?Liver Function Tests: ?Recent Labs  ?Lab 06/27/21 ?2327 06/28/21 ?9937  ?AST 32 31  ?ALT 26 25  ?ALKPHOS 118 117  ?BILITOT 0.8 0.7  ?PROT 7.1 6.8  ?ALBUMIN 3.5 3.3*  ? ? ?Recent Labs  ?Lab 06/27/21 ?2327  ?LIPASE 27  ? ? ?No results for input(s): AMMONIA in the last 168 hours. ?Coagulation Profile: ?No results for input(s): INR, PROTIME in the last 168 hours. ?Cardiac Enzymes: ?No results for input(s): CKTOTAL, CKMB, CKMBINDEX, TROPONINI in the last 168 hours. ?BNP (last 3 results) ?No results for input(s): PROBNP in the last 8760 hours. ?HbA1C: ?No results for input(s): HGBA1C in the last 72 hours. ?CBG: ?Recent Labs  ?Lab 06/28/21 ?2119 07/02/21 ?1119  ?GLUCAP 125* 107*  ? ? ?Lipid Profile: ?No results for input(s): CHOL, HDL, LDLCALC, TRIG, CHOLHDL, LDLDIRECT in the last 72 hours. ?Thyroid Function Tests: ?No results for input(s): TSH, T4TOTAL, FREET4, T3FREE, THYROIDAB in the last 72 hours. ?Anemia Panel: ?No results for input(s): VITAMINB12, FOLATE, FERRITIN, TIBC, IRON, RETICCTPCT in the last 72 hours. ?Sepsis Labs: ?No results for input(s): PROCALCITON, LATICACIDVEN in the last 168 hours. ? ?No results found for this or any previous visit (from the past 240 hour(s)).  ? ? ?Radiology Studies: ?No results found. ? ? ? ?Scheduled Meds: ? feeding supplement  237 mL Oral BID BM  ? furosemide  20 mg Oral Daily  ? gabapentin  300 mg Oral BID  ? heparin  5,000 Units Subcutaneous Q8H  ? hydrocortisone cream   Topical TID  ? ivabradine  5 mg Oral BID WC  ? midodrine  5 mg Oral BID WC  ? pantoprazole  40 mg Oral Daily  ? ?Continuous Infusions: ? methocarbamol (ROBAXIN) IV 500 mg (07/01/21 1648)  ? ? ? LOS: 6 days   ? ? ? ?Dessa Phi, DO ?[facility] ?07/04/2021, 10:24 AM  ? ?Available via Epic secure chat 7am-7pm ?After these hours, please refer to coverage provider listed on [email or web address] ? ?

## 2021-07-04 NOTE — Progress Notes (Signed)
Pt has ambulated in the hallway multiple times today. Pt has remained on RA without complaints of being SOB.  ?

## 2021-07-05 DIAGNOSIS — I5043 Acute on chronic combined systolic (congestive) and diastolic (congestive) heart failure: Secondary | ICD-10-CM

## 2021-07-05 DIAGNOSIS — I420 Dilated cardiomyopathy: Secondary | ICD-10-CM

## 2021-07-05 LAB — BASIC METABOLIC PANEL
Anion gap: 9 (ref 5–15)
BUN: 31 mg/dL — ABNORMAL HIGH (ref 8–23)
CO2: 25 mmol/L (ref 22–32)
Calcium: 9.6 mg/dL (ref 8.9–10.3)
Chloride: 105 mmol/L (ref 98–111)
Creatinine, Ser: 1.09 mg/dL (ref 0.61–1.24)
GFR, Estimated: >60 mL/min (ref >60)
Glucose, Bld: 147 mg/dL — ABNORMAL HIGH (ref 70–99)
Potassium: 3.7 mmol/L (ref 3.5–5.1)
Sodium: 139 mmol/L (ref 135–145)

## 2021-07-05 MED ORDER — POTASSIUM CHLORIDE CRYS ER 20 MEQ PO TBCR
40.0000 meq | EXTENDED_RELEASE_TABLET | Freq: Once | ORAL | Status: AC
Start: 2021-07-05 — End: 2021-07-05
  Administered 2021-07-05: 40 meq via ORAL
  Filled 2021-07-05: qty 2

## 2021-07-05 MED ORDER — METHOCARBAMOL 500 MG PO TABS: 500.0000 mg | ORAL_TABLET | Freq: Three times a day (TID) | ORAL | Status: DC | PRN

## 2021-07-05 MED ORDER — MIDODRINE HCL 5 MG PO TABS
5.0000 mg | ORAL_TABLET | Freq: Three times a day (TID) | ORAL | Status: DC
  Administered 2021-07-05 – 2021-07-06 (×2): 5 mg via ORAL
  Filled 2021-07-05 (×2): qty 1

## 2021-07-05 NOTE — Progress Notes (Signed)
? ?Progress Note ? ?Patient Name: Ryan [name] ?Date of Encounter: 07/05/2021 ? ?Primary Cardiologist: None  ? ?Subjective  ? ?Shortness of breath persists.  He denies any dizziness.  No syncope. ? ?Inpatient Medications  ?  ?Scheduled Meds: ? feeding supplement  237 mL Oral BID BM  ? furosemide  20 mg Oral Daily  ? gabapentin  300 mg Oral BID  ? heparin  5,000 Units Subcutaneous Q8H  ? hydrocortisone cream   Topical TID  ? ivabradine  5 mg Oral BID WC  ? midodrine  5 mg Oral BID WC  ? pantoprazole  40 mg Oral Daily  ? ?Continuous Infusions: ? methocarbamol (ROBAXIN) IV 500 mg (07/01/21 1648)  ? ?PRN Meds: ?acetaminophen **OR** acetaminophen, albuterol, Lubriderm, methocarbamol (ROBAXIN) IV, ondansetron **OR** ondansetron (ZOFRAN) IV, senna-docusate, traMADol  ? ?Vital Signs  ?  ?Vitals:  ? 07/04/21 2000 07/04/21 2058 07/05/21 0454 07/05/21 0500  ?BP: 97/74 97/74 113/77   ?Pulse: 83 83 92   ?Resp: '18 18 18   '$ ?Temp: 97.7 ?F (36.5 ?C) 97.7 ?F (36.5 ?C) 97.8 ?F (36.6 ?C)   ?TempSrc: Axillary Oral Oral   ?SpO2: 100% 100% 100%   ?Weight:    75 kg  ?Height:      ? ? ?Intake/Output Summary (Last 24 hours) at 07/05/2021 1219 ?Last data filed at 07/05/2021 [phone] ?Gross per 24 hour  ?Intake 957 ml  ?Output 1800 ml  ?Net -843 ml  ? ? ?Filed Weights  ? 07/03/21 0600 07/04/21 0557 07/05/21 0500  ?Weight: 72.6 kg 74.2 kg 75 kg  ? ? ?Telemetry  ?  ?Normal sinus rhythm- Personally Reviewed ? ?Physical Exam  ? ?GEN: Well nourished, well developed in no acute distress ?HEENT: Normal ?NECK: No JVD; No carotid bruits ?LYMPHATICS: No lymphadenopathy ?CARDIAC:RRR, no murmurs, rubs, gallops ?RESPIRATORY:  Clear to auscultation without rales, wheezing or rhonchi  ?ABDOMEN: Soft, non-tender, non-distended ?MUSCULOSKELETAL:  No edema; No deformity  ?SKIN: Warm and dry ?NEUROLOGIC:  Alert and oriented x 3 ?PSYCHIATRIC:  Normal affect   ? ?Labs  ?  ?Chemistry ?Recent Labs  ?Lab 07/03/21 ?6468 07/04/21 ?0427 07/05/21 ?0446  ?NA 136 136 139  ?K  3.5 3.4* 3.7  ?CL 96* 100 105  ?CO2 '29 29 25  '$ ?GLUCOSE 91 75 147*  ?BUN 34* 32* 31*  ?CREATININE 1.26* 1.12 1.09  ?CALCIUM 9.3 9.1 9.6  ?GFRNONAA >60 >60 >60  ?ANIONGAP '11 7 9  '$ ? ?  ? ?Hematology ?No results for input(s): WBC, RBC, HGB, HCT, MCV, MCH, MCHC, RDW, PLT in the last 168 hours. ? ? ?Cardiac EnzymesNo results for input(s): TROPONINI in the last 168 hours. No results for input(s): TROPIPOC in the last 168 hours.  ? ?BNP ?Recent Labs  ?Lab 06/30/21 ?0440  ?BNP 1,629.0*  ? ?  ? ?DDimer  ?No results for input(s): DDIMER in the last 168 hours. ?  ? ?Radiology  ?  ?No results found. ? ?Patient Profile  ?   ?71 y.o. male End stage heart failure ? ?Assessment & Plan  ?  ?Acute on Chronic stage D HFrEFEF 15 to 20% ?CKD Stabe IIIb ?Chronic pancreatitis ?Former Alcohol abuse, former poly substance abuse rare tobacco use ?HTN-> Orthostatic hypotension ?- presently he will not tolerate GDMT:  Coreg, ARNI, ARB have been held and he has not started SGLT2i because of low BP ?-He has been on ivabradine 5 mg twice daily for heart rate control ?- Started Lasix 20 mg daily yesterday and put out 1.8  L and is net -9.3 L since admission ?- Weight is down 6 pounds since admission ?- Serum creatinine stable at 1.09 today and potassium 3.7 ?-He remains volume overloaded so we will continue Lasix 20 mg p.o. daily since he is diuresing well on this  ?-BP is fairly stable but soft so I will increase midodrine to 5 mg 3 times daily at 7 AM/12 noon and 4 PM ?-Add thigh-high compression hose ?-Check orthostatic blood pressures in a.m. ?-Consider addition of abdominal binder if he remains orthostatic ?-hypotension may be a marker of end stage HF; he is not a candidate for AHF therapies ? ?I have spent a total of 35 minutes with patient reviewing 2D echo , telemetry, EKGs, labs and examining patient as well as establishing an assessment and plan that was discussed with the patient.  > 50% of time was spent in direct patient care.    ? ?For  questions or updates, please contact [place] ?Please consult [email or web address] for contact info under Cardiology/STEMI. ?  ?   ?Signed, ?Fransico Him, MD  ?07/05/2021, 12:19 PM   ? ?

## 2021-07-05 NOTE — Progress Notes (Signed)
?PROGRESS NOTE ? ? ? ?Ryan [name]  CLE:[id number] DOB: 1950/05/26 DOA: 06/27/2021 ?PCP: [facility] [place]  ? ?  ?Brief Narrative:  ?Ryan [name] is a 71 y.o. male with medical history significant of combined CHF, CKD, depression, GERD, chronic pancreatitis, history of alcohol abuse, history of polysubstance abuse, hypertension, and more presents the ED with a chief complaint of leg swelling and dyspnea.  Patient reports that this leg swelling started 1 day ago.  He reports that this is never happened to him before.  The left leg is greater than the right leg however he has had extensive injury to the left leg before.  Patient desatted down to 75% in the emergency department.  Patient was admitted with respiratory failure secondary to CHF exacerbation.  Cardiology consulted. ? ?New events last 24 hours / Subjective: ?Feeling a little better. Has been ambulating. UOP 1883m yesterday. BP improved on midodrine.  ? ?Assessment & Plan: ?  ? ?Principal Problem: ?  Acute respiratory failure with hypoxia ([place] ?Active Problems: ?  Essential hypertension ?  Tobacco use disorder ?  Acute on chronic combined systolic and diastolic CHF (congestive heart failure) ([place] ?  Chronic pancreatitis ([place] ?  Enteritis ?  Abdominal pain ? ? ?Acute hypoxemic respiratory failure ?-Desatted down to 75% on room air, and required nasal cannula O2 ?-Likely secondary to heart failure ?-On room air this morning ? ?Acute on chronic combined systolic and diastolic heart failure ?-BNP 1701 ?-Appreciate cardiology ?-Medical therapy limited due to hypotension.  Cardiology has started him on Lasix and midodrine ?-Continue Corlanor, lasix  ? ?Chronic pancreatitis ?-Supportive care ? ?Tobacco abuse ?-Cessation counseling ? ?3.9cm ascending aortic aneurysm ?-Annual imaging recommended ? ?Eczema ?-Hydrocortisone and lotion PRN  ? ?Hypokalemia ?-Replace ? ? ? ?DVT prophylaxis:  ?Place TED hose Start: 07/05/21 1227 ?heparin injection 5,000  Units Start: 06/28/21 0600 ?SCDs Start: 06/28/21 0425 ? ?Code Status: Full code ?Family Communication: No family at bedside ?Disposition Plan:  ?Status is: Inpatient ?Remains inpatient appropriate because: CHF exacerbation ? ?Antimicrobials:  ?Anti-infectives (From admission, onward)  ? ? None  ? ?  ? ? ? ?Objective: ?Vitals:  ? 07/04/21 2000 07/04/21 2058 07/05/21 0454 07/05/21 0500  ?BP: 97/74 97/74 113/77   ?Pulse: 83 83 92   ?Resp: '18 18 18   '$ ?Temp: 97.7 ?F (36.5 ?C) 97.7 ?F (36.5 ?C) 97.8 ?F (36.6 ?C)   ?TempSrc: Axillary Oral Oral   ?SpO2: 100% 100% 100%   ?Weight:    75 kg  ?Height:      ? ? ?Intake/Output Summary (Last 24 hours) at 07/05/2021 1241 ?Last data filed at 07/05/2021 [phone]?Gross per 24 hour  ?Intake 957 ml  ?Output 1800 ml  ?Net -843 ml  ? ? ?Filed Weights  ? 07/03/21 0600 07/04/21 0557 07/05/21 0500  ?Weight: 72.6 kg 74.2 kg 75 kg  ? ? ?Examination:  ?General exam: Appears calm and comfortable  ?Respiratory system: Clear to auscultation. Respiratory effort normal. No respiratory distress. No conversational dyspnea.  On room air ?Cardiovascular system: S1 & S2 heard, RRR. No murmurs.  ?Gastrointestinal system: Abdomen is nondistended, soft and nontender. Normal bowel sounds heard. ?Central nervous system: Alert and oriented ?Extremities: Symmetric in appearance  ?Psychiatry: Judgement and insight appear normal. Mood & affect appropriate.  ? ?Data Reviewed: I have personally reviewed following labs and imaging studies ? ?CBC: ?No results for input(s): WBC, NEUTROABS, HGB, HCT, MCV, PLT in the last 168 hours. ? ?Basic Metabolic Panel: ?Recent Labs  ?  Lab 06/30/21 ?4332 07/01/21 ?9518 07/02/21 ?8416 07/03/21 ?6063 07/04/21 ?0160 07/05/21 ?1093  ?NA 137 138 138 136 136 139  ?K 3.7 3.6 3.9 3.5 3.4* 3.7  ?CL 103 102 99 96* 100 105  ?CO2 '27 27 29 29 29 25  '$ ?GLUCOSE 91 119* 87 91 75 147*  ?BUN 25* 27* 26* 34* 32* 31*  ?CREATININE 1.23 1.14 1.05 1.26* 1.12 1.09  ?CALCIUM 8.9 9.0 9.5 9.3 9.1 9.6  ?MG 2.3  --    --   --   --   --   ? ? ?GFR: ?Estimated Creatinine Clearance: 65.9 mL/min (by C-G formula based on SCr of 1.09 mg/dL). ?Liver Function Tests: ?No results for input(s): AST, ALT, ALKPHOS, BILITOT, PROT, ALBUMIN in the last 168 hours. ? ?No results for input(s): LIPASE, AMYLASE in the last 168 hours. ? ?No results for input(s): AMMONIA in the last 168 hours. ?Coagulation Profile: ?No results for input(s): INR, PROTIME in the last 168 hours. ?Cardiac Enzymes: ?No results for input(s): CKTOTAL, CKMB, CKMBINDEX, TROPONINI in the last 168 hours. ?BNP (last 3 results) ?No results for input(s): PROBNP in the last 8760 hours. ?HbA1C: ?No results for input(s): HGBA1C in the last 72 hours. ?CBG: ?Recent Labs  ?Lab 06/28/21 ?2119 07/02/21 ?1119  ?GLUCAP 125* 107*  ? ? ?Lipid Profile: ?No results for input(s): CHOL, HDL, LDLCALC, TRIG, CHOLHDL, LDLDIRECT in the last 72 hours. ?Thyroid Function Tests: ?No results for input(s): TSH, T4TOTAL, FREET4, T3FREE, THYROIDAB in the last 72 hours. ?Anemia Panel: ?No results for input(s): VITAMINB12, FOLATE, FERRITIN, TIBC, IRON, RETICCTPCT in the last 72 hours. ?Sepsis Labs: ?No results for input(s): PROCALCITON, LATICACIDVEN in the last 168 hours. ? ?No results found for this or any previous visit (from the past 240 hour(s)).  ? ? ?Radiology Studies: ?No results found. ? ? ? ?Scheduled Meds: ? feeding supplement  237 mL Oral BID BM  ? furosemide  20 mg Oral Daily  ? gabapentin  300 mg Oral BID  ? heparin  5,000 Units Subcutaneous Q8H  ? hydrocortisone cream   Topical TID  ? ivabradine  5 mg Oral BID WC  ? midodrine  5 mg Oral TID WC  ? pantoprazole  40 mg Oral Daily  ? ?Continuous Infusions: ? methocarbamol (ROBAXIN) IV 500 mg (07/01/21 1648)  ? ? ? LOS: 7 days  ? ? ? ?Dessa Phi, DO ?[facility] ?07/05/2021, 12:41 PM  ? ?Available via Epic secure chat 7am-7pm ?After these hours, please refer to coverage provider listed on [email or web address] ? ?

## 2021-07-06 DIAGNOSIS — I5020 Unspecified systolic (congestive) heart failure: Secondary | ICD-10-CM

## 2021-07-06 LAB — BASIC METABOLIC PANEL
Anion gap: 7 (ref 5–15)
BUN: 26 mg/dL — ABNORMAL HIGH (ref 8–23)
CO2: 23 mmol/L (ref 22–32)
Calcium: 9.1 mg/dL (ref 8.9–10.3)
Chloride: 108 mmol/L (ref 98–111)
Creatinine, Ser: 1.03 mg/dL (ref 0.61–1.24)
GFR, Estimated: >60 mL/min (ref >60)
Glucose, Bld: 118 mg/dL — ABNORMAL HIGH (ref 70–99)
Potassium: 3.9 mmol/L (ref 3.5–5.1)
Sodium: 138 mmol/L (ref 135–145)

## 2021-07-06 LAB — MAGNESIUM: Magnesium: 2.3 mg/dL (ref 1.7–2.4)

## 2021-07-06 MED ORDER — FUROSEMIDE 20 MG PO TABS: 20.0000 mg | ORAL_TABLET | Freq: Every day | ORAL | 3 refills | Status: DC

## 2021-07-06 MED ORDER — SPIRONOLACTONE 25 MG PO TABS
12.5000 mg | ORAL_TABLET | Freq: Every day | ORAL | Status: DC
  Filled 2021-07-06 (×3): qty 0.5

## 2021-07-06 MED ORDER — SPIRONOLACTONE 25 MG PO TABS: 12.5000 mg | ORAL_TABLET | Freq: Every day | ORAL | 3 refills | Status: DC

## 2021-07-06 MED ORDER — MIDODRINE HCL 5 MG PO TABS: 5.0000 mg | ORAL_TABLET | Freq: Three times a day (TID) | ORAL | 3 refills | Status: DC

## 2021-07-06 NOTE — Progress Notes (Signed)
? ?Progress Note ? ?Patient Name: Ryan [name] ?Date of Encounter: 07/06/2021 ? ?Primary Cardiologist: Carlyle Dolly, MD ?Advanced Heart Failure: Pierre Bali, MD ? ?Subjective  ? ?No shortness of breath this morning.  No dizziness or leg swelling. ? ?Inpatient Medications  ?  ?Scheduled Meds: ? feeding supplement  237 mL Oral BID BM  ? furosemide  20 mg Oral Daily  ? gabapentin  300 mg Oral BID  ? heparin  5,000 Units Subcutaneous Q8H  ? hydrocortisone cream   Topical TID  ? ivabradine  5 mg Oral BID WC  ? midodrine  5 mg Oral TID WC  ? pantoprazole  40 mg Oral Daily  ? ?PRN Meds: ?acetaminophen **OR** acetaminophen, albuterol, Lubriderm, methocarbamol, ondansetron **OR** ondansetron (ZOFRAN) IV, senna-docusate, traMADol  ? ?Vital Signs  ?  ?Vitals:  ? 07/05/21 1251 07/05/21 2105 07/06/21 0435 07/06/21 0500  ?BP: 94/65 97/63 119/90   ?Pulse: 85 81 93   ?Resp: 20 18    ?Temp: 98 ?F (36.7 ?C) 97.8 ?F (36.6 ?C)    ?TempSrc: Oral     ?SpO2: 100% 100% 100%   ?Weight:    67.5 kg  ?Height:      ? ? ?Intake/Output Summary (Last 24 hours) at 07/06/2021 1003 ?Last data filed at 07/06/2021 0500 ?Gross per 24 hour  ?Intake 960 ml  ?Output 975 ml  ?Net -15 ml  ? ?Filed Weights  ? 07/04/21 0557 07/05/21 0500 07/06/21 0500  ?Weight: 74.2 kg 75 kg 67.5 kg  ? ? ?Telemetry  ?  ?Sinus rhythm.  Personally reviewed. ? ?ECG  ?  ?No ECG reviewed. ? ?Physical Exam  ? ?GEN: No acute distress.   ?Neck: No JVD. ?Cardiac: RRR, no gallop.  ?Respiratory: Nonlabored. Clear to auscultation bilaterally. ?GI: Soft, nontender, bowel sounds present. ?MS: No edema; wearing compression hose. ? ?Labs  ?  ?Chemistry ?Recent Labs  ?Lab 07/04/21 ?0427 07/05/21 ?0446 07/06/21 ?0425  ?NA 136 139 138  ?K 3.4* 3.7 3.9  ?CL 100 105 108  ?CO2 '29 25 23  '$ ?GLUCOSE 75 147* 118*  ?BUN 32* 31* 26*  ?CREATININE 1.12 1.09 1.03  ?CALCIUM 9.1 9.6 9.1  ?GFRNONAA >60 >60 >60  ?ANIONGAP '7 9 7  '$ ?  ?Cardiac Enzymes ?Recent Labs  ?Lab 06/27/21 ?1249 06/27/21 ?1449   ?TROPONINIHS 16 17  ? ? ?BNP ?Recent Labs  ?Lab 06/30/21 ?0440  ?BNP 1,629.0*  ?  ? ?Radiology  ?  ?No results found. ? ?Cardiac Studies  ? ?Echocardiogram 11/29/2020: ? 1. Global hypokinesis. The anterior, anteroseptal walls are akinetic. Marland Kitchen  ?Left ventricular ejection fraction, by estimation, is 15-20%. The left  ?ventricle has severely decreased function. The left ventricle demonstrates  ?global hypokinesis. The left  ?ventricular internal cavity size was severely dilated. Left ventricular  ?diastolic parameters are indeterminate.  ? 2. Right ventricular systolic function is normal. The right ventricular  ?size is normal.  ? 3. Left atrial size was severely dilated.  ? 4. The mitral valve is normal in structure. Trivial mitral valve  ?regurgitation. No evidence of mitral stenosis.  ? 5. The aortic valve is tricuspid. Aortic valve regurgitation is not  ?visualized. No aortic stenosis is present.  ? ?Assessment & Plan  ? ?1.  HFrEF with nonischemic cardiomyopathy and LVEF 15 to 20% by last assessment.  GDMT substantially limited by orthostasis.  Blood pressure stable on midodrine and compression hose.  He has tolerated diuresis and feels better clinically.  Not candidate for advanced heart failure  therapies.  He has followed with Dr. Britta Mccreedy on long-term. ? ?2.  CKD stage IIIb, creatinine currently 1.03. ? ?3.  Prior history of alcohol abuse and pancreatitis. ? ?Orthostatics stable.  Anticipate discharge home today, will try to set up TOC visit in the heart failure clinic within a week.  Would start Aldactone 12.5 mg daily, otherwise continue Lasix 20 mg daily, Corlanor 5 mg twice daily, and midodrine 5 mg 3 times a day.  Continue with compression stockings. ? ?Signed, ?Rozann Lesches, MD  ?07/06/2021, 10:03 AM    ?

## 2021-07-06 NOTE — Discharge Summary (Signed)
?Physician Discharge Summary ?  ?Patient: Ryan [name] MRN: [id number] DOB: 01/13/1951  ?Admit date:     06/27/2021  ?Discharge date: 07/06/2021  ?Discharge Physician: Dwyane Dee  ? ?PCP: [facility] [place]  ? ?Recommendations at discharge:  ? ? Follow up with cardiology ? ?Discharge Diagnoses: ?Principal Problem: ?  Acute respiratory failure with hypoxia ([place]) ?Active Problems: ?  Essential hypertension ?  Tobacco use disorder ?  Acute on chronic combined systolic and diastolic CHF (congestive heart failure) ([place]) ?  Chronic pancreatitis ([place]) ?  Enteritis ?  Abdominal pain ? ?Resolved Problems: ?  Chronic combined systolic and diastolic CHF (congestive heart failure) ([place]) ?  Hyperlipidemia ? ?Hospital Course: ? ?Acute hypoxemic respiratory failure-resolved ?-Desatted down to 75% on room air, and required nasal cannula O2 ?-Likely secondary to heart failure ?-Weaned to room air with diuresis during hospitalization ?  ?Acute on chronic combined systolic and diastolic heart failure ?-BNP 1701 ?-Followed by cardiology during hospitalization, greatly appreciate assistance ?- Negative orthostatic blood pressure check prior to discharge ?-Patient discharged on Corlanor, spironolactone, midodrine, Lasix ?-Outpatient follow-up with cardiology ?  ?Chronic pancreatitis ?-Supportive care ?  ?Tobacco abuse ?-Cessation counseling ?  ?3.9cm ascending aortic aneurysm ?-Annual imaging recommended ?  ?Eczema ?-Hydrocortisone and lotion PRN  ?  ?Hypokalemia ?-Repleted ? ? ?  ? ? ?Consultants: Cardiology ?Procedures performed:   ?Disposition: Home ?Diet recommendation:  ?Discharge Diet Orders (From admission, onward)  ? ?  Start     Ordered  ? 07/06/21 0000  Diet - low sodium heart healthy       ? 07/06/21 1038  ? ?  ?  ? ?  ? ?Cardiac diet ?DISCHARGE MEDICATION: ?Allergies as of 07/06/2021   ? ?   Reactions  ? Tape Other (See Comments)  ? "TAPE TEARS OFF MY SKIN"  ? ?  ? ?  ?Medication List  ?  ? ?STOP taking these  medications   ? ?carvedilol 3.125 MG tablet ?Commonly known as: COREG ?  ?sacubitril-valsartan 24-26 MG ?Commonly known as: ENTRESTO ?  ? ?  ? ?TAKE these medications   ? ?acetaminophen 500 MG tablet ?Commonly known as: TYLENOL ?Take 500 mg by mouth every 6 (six) hours as needed for mild pain. ?  ?albuterol 108 (90 Base) MCG/ACT inhaler ?Commonly known as: VENTOLIN HFA ?Inhale 2 puffs into the lungs every 6 (six) hours as needed for wheezing or shortness of breath. ?  ?bismuth subsalicylate 657 QI/69GE suspension ?Commonly known as: PEPTO BISMOL ?Take 30 mLs by mouth every 6 (six) hours as needed for indigestion. ?  ?furosemide 20 MG tablet ?Commonly known as: LASIX ?Take 1 tablet (20 mg total) by mouth daily. ?Start taking on: July 07, 2021 ?What changed:  ?medication strength ?how much to take ?when to take this ?  ?gabapentin 300 MG capsule ?Commonly known as: NEURONTIN ?Take 300 mg by mouth 2 (two) times daily. ?  ?ivabradine 5 MG Tabs tablet ?Commonly known as: CORLANOR ?Take 1 tablet (5 mg total) by mouth 2 (two) times daily with a meal. ?  ?loratadine 10 MG tablet ?Commonly known as: CLARITIN ?Take 10 mg by mouth daily as needed for allergies. ?  ?midodrine 5 MG tablet ?Commonly known as: PROAMATINE ?Take 1 tablet (5 mg total) by mouth 3 (three) times daily with meals. ?  ?naloxone 4 MG/0.1ML Liqd nasal spray kit ?Commonly known as: NARCAN ?Place 1 spray into the nose once as needed (overdose). ?  ?pantoprazole 40 MG tablet ?Commonly  known as: PROTONIX ?Take 1 tablet (40 mg total) by mouth daily. ?  ?spironolactone 25 MG tablet ?Commonly known as: ALDACTONE ?Take 0.5 tablets (12.5 mg total) by mouth daily. ?  ? ?  ? ? ?Discharge Exam: ?Filed Weights  ? 07/04/21 0557 07/05/21 0500 07/06/21 0500  ?Weight: 74.2 kg 75 kg 67.5 kg  ? ?Physical Exam ?Constitutional:   ?   General: He is not in acute distress. ?   Appearance: Normal appearance.  ?HENT:  ?   Head: Normocephalic and atraumatic.  ?   Mouth/Throat:  ?    Mouth: Mucous membranes are moist.  ?Eyes:  ?   Extraocular Movements: Extraocular movements intact.  ?Cardiovascular:  ?   Rate and Rhythm: Normal rate and regular rhythm.  ?   Heart sounds: Normal heart sounds.  ?Pulmonary:  ?   Effort: Pulmonary effort is normal. No respiratory distress.  ?   Breath sounds: Normal breath sounds. No wheezing.  ?Abdominal:  ?   General: Bowel sounds are normal. There is no distension.  ?   Palpations: Abdomen is soft.  ?   Tenderness: There is no abdominal tenderness.  ?Musculoskeletal:     ?   General: Normal range of motion.  ?   Cervical back: Normal range of motion and neck supple.  ?Skin: ?   General: Skin is warm and dry.  ?Neurological:  ?   General: No focal deficit present.  ?   Mental Status: He is alert.  ?Psychiatric:     ?   Mood and Affect: Mood normal.     ?   Behavior: Behavior normal.  ? ? ? ?Condition at discharge: stable ? ?The results of significant diagnostics from this hospitalization (including imaging, microbiology, ancillary and laboratory) are listed below for reference.  ? ?Imaging Studies: ?DG Chest 2 View ? ?Result Date: 06/27/2021 ?CLINICAL DATA:  Chest pain and shortness of breath for several days. Left leg swelling. Lightheadedness. EXAM: CHEST - 2 VIEW COMPARISON:  11/27/2020 FINDINGS: Stable mild-to-moderate cardiomegaly. Low lung volumes again seen. Pleural-parenchymal scarring in the right lower lung remains stable. No evidence of pulmonary infiltrate or edema. IMPRESSION: Stable cardiomegaly and right lower lung scarring. No active lung disease. Electronically Signed   By: Marlaine Hind M.D.   On: 06/27/2021 12:53  ? ?CT Angio Chest PE W and/or Wo Contrast ? ?Result Date: 06/28/2021 ?CLINICAL DATA:  Positive D-dimer, worsening shortness of breath, lightheadedness, history of pancreatitis. EXAM: CT ANGIOGRAPHY CHEST CT ABDOMEN AND PELVIS WITH CONTRAST TECHNIQUE: Multidetector CT imaging of the chest was performed using the standard protocol during  bolus administration of intravenous contrast. Multiplanar CT image reconstructions and MIPs were obtained to evaluate the vascular anatomy. Multidetector CT imaging of the abdomen and pelvis was performed using the standard protocol during bolus administration of intravenous contrast. RADIATION DOSE REDUCTION: This exam was performed according to the departmental dose-optimization program which includes automated exposure control, adjustment of the mA and/or kV according to patient size and/or use of iterative reconstruction technique. CONTRAST:  14mL OMNIPAQUE IOHEXOL 350 MG/ML SOLN COMPARISON:  CT chest without contrast 11/23/2015, CT abdomen and pelvis with contrast 12/31/2020. FINDINGS: CTA CHEST FINDINGS Cardiovascular: There is IVC reflux which may be seen with right heart strain, tricuspid regurgitation, and rapid contrast injection. There is moderate to severe panchamber cardiomegaly increased over all of the prior studies. No pericardial effusion is seen. There is left circumflex coronary artery calcification proximally without other visible CAD. There is mild aortic atherosclerosis  with a slightly aneurysmal aortic root measuring 4.2 cm, borderline aneurysmal ascending segment 3.9 cm. The pulmonary arteries have increased in prominence since 2017 with the pulmonary trunk 3.4 cm indicating arterial hypertension. No arterial embolic filling defect is seen through fairly abundant breathing motion artifact. Central pulmonary veins are mildly distended Mediastinum/Nodes: The trachea, lower poles of the thyroid, and axillary spaces are unremarkable. There are few slightly prominent right hilar nodes developed since 2017 but no bulky, encasing or further intrathoracic adenopathy. Lungs/Pleura: Increased minimal to small sized layering left pleural effusion. Increased small to moderate-sized right pleural effusion with at least partial loculation medially and extension into the major fissure where it may also be  loculated. There is no pneumothorax or pleural thickening. Scattered linear scarring is again present in the lower lobes. There is mild interstitial edema in the lung bases. There is increased bilateral lower

## 2021-07-06 NOTE — Progress Notes (Signed)
Nsg Discharge Note ? ?Admit Date:  06/27/2021 ?Discharge date: 07/06/2021 ?  ?[name] to be D/C'd Home per MD order.  AVS completed.  Copy for chart, and copy for patient signed, and dated. ?Patient/caregiver able to verbalize understanding. ? ?Discharge Medication: ?Allergies as of 07/06/2021   ? ?   Reactions  ? Tape Other (See Comments)  ? "TAPE TEARS OFF MY SKIN"  ? ?  ? ?  ?Medication List  ?  ? ?STOP taking these medications   ? ?carvedilol 3.125 MG tablet ?Commonly known as: COREG ?  ?sacubitril-valsartan 24-26 MG ?Commonly known as: ENTRESTO ?  ? ?  ? ?TAKE these medications   ? ?acetaminophen 500 MG tablet ?Commonly known as: TYLENOL ?Take 500 mg by mouth every 6 (six) hours as needed for mild pain. ?  ?albuterol 108 (90 Base) MCG/ACT inhaler ?Commonly known as: VENTOLIN HFA ?Inhale 2 puffs into the lungs every 6 (six) hours as needed for wheezing or shortness of breath. ?  ?bismuth subsalicylate 568 LE/75TZ suspension ?Commonly known as: PEPTO BISMOL ?Take 30 mLs by mouth every 6 (six) hours as needed for indigestion. ?  ?furosemide 20 MG tablet ?Commonly known as: LASIX ?Take 1 tablet (20 mg total) by mouth daily. ?Start taking on: July 07, 2021 ?What changed:  ?medication strength ?how much to take ?when to take this ?  ?gabapentin 300 MG capsule ?Commonly known as: NEURONTIN ?Take 300 mg by mouth 2 (two) times daily. ?  ?ivabradine 5 MG Tabs tablet ?Commonly known as: CORLANOR ?Take 1 tablet (5 mg total) by mouth 2 (two) times daily with a meal. ?  ?loratadine 10 MG tablet ?Commonly known as: CLARITIN ?Take 10 mg by mouth daily as needed for allergies. ?  ?midodrine 5 MG tablet ?Commonly known as: PROAMATINE ?Take 1 tablet (5 mg total) by mouth 3 (three) times daily with meals. ?  ?naloxone 4 MG/0.1ML Liqd nasal spray kit ?Commonly known as: NARCAN ?Place 1 spray into the nose once as needed (overdose). ?  ?pantoprazole 40 MG tablet ?Commonly known as: PROTONIX ?Take 1 tablet (40 mg total) by  mouth daily. ?  ?spironolactone 25 MG tablet ?Commonly known as: ALDACTONE ?Take 0.5 tablets (12.5 mg total) by mouth daily. ?  ? ?  ? ? ?Discharge Assessment: ?Vitals:  ? 07/05/21 2105 07/06/21 0435  ?BP: 97/63 119/90  ?Pulse: 81 93  ?Resp: 18   ?Temp: 97.8 ?F (36.6 ?C)   ?SpO2: 100% 100%  ? Skin clean, dry and intact without evidence of skin break down, no evidence of skin tears noted. ?IV catheter discontinued intact. Site without signs and symptoms of complications - no redness or edema noted at insertion site, patient denies c/o pain - only slight tenderness at site.  Dressing with slight pressure applied. ? ?D/c Instructions-Education: ?Discharge instructions given to patient/family with verbalized understanding. ?D/c education completed with patient/family including follow up instructions, medication list, d/c activities limitations if indicated, with other d/c instructions as indicated by MD - patient able to verbalize understanding, all questions fully answered. ?Patient instructed to return to ED, call 911, or call MD for any changes in condition.  ?Patient escorted via [place], and D/C home via private auto. ? ?Clovis Fredrickson, LPN ?0/03/7492 49:67 AM  ?

## 2021-07-27 ENCOUNTER — Telehealth (HOSPITAL_COMMUNITY): Payer: Self-pay

## 2021-07-27 NOTE — Telephone Encounter (Signed)
Called and left patient a voice message to confirm/remind patient of their appointment at the [facility] on 07/28/21.  ? ? ? ?

## 2021-07-28 ENCOUNTER — Other Ambulatory Visit: Payer: Self-pay

## 2021-07-28 ENCOUNTER — Encounter (HOSPITAL_COMMUNITY): Payer: Self-pay | Admitting: Emergency Medicine

## 2021-07-28 ENCOUNTER — Emergency Department (HOSPITAL_COMMUNITY): Payer: No Typology Code available for payment source

## 2021-07-28 ENCOUNTER — Inpatient Hospital Stay (HOSPITAL_COMMUNITY)
Admission: EM | Admit: 2021-07-28 | Discharge: 2021-08-04 | DRG: 291 | Disposition: A | Discharge status: Home / Self Care | Payer: No Typology Code available for payment source | Attending: Cardiology | Admitting: Cardiology

## 2021-07-28 ENCOUNTER — Encounter (HOSPITAL_COMMUNITY): Payer: No Typology Code available for payment source

## 2021-07-28 DIAGNOSIS — N179 Acute kidney failure, unspecified: Secondary | ICD-10-CM | POA: Diagnosis present

## 2021-07-28 DIAGNOSIS — I4729 Other ventricular tachycardia: Secondary | ICD-10-CM

## 2021-07-28 DIAGNOSIS — I5082 Biventricular heart failure: Secondary | ICD-10-CM | POA: Diagnosis present

## 2021-07-28 DIAGNOSIS — I272 Pulmonary hypertension, unspecified: Secondary | ICD-10-CM

## 2021-07-28 DIAGNOSIS — Z91048 Other nonmedicinal substance allergy status: Secondary | ICD-10-CM

## 2021-07-28 DIAGNOSIS — I5023 Acute on chronic systolic (congestive) heart failure: Secondary | ICD-10-CM | POA: Insufficient documentation

## 2021-07-28 DIAGNOSIS — F1010 Alcohol abuse, uncomplicated: Secondary | ICD-10-CM | POA: Diagnosis present

## 2021-07-28 DIAGNOSIS — J9601 Acute respiratory failure with hypoxia: Secondary | ICD-10-CM | POA: Diagnosis present

## 2021-07-28 DIAGNOSIS — K860 Alcohol-induced chronic pancreatitis: Secondary | ICD-10-CM | POA: Diagnosis present

## 2021-07-28 DIAGNOSIS — G8929 Other chronic pain: Secondary | ICD-10-CM | POA: Diagnosis present

## 2021-07-28 DIAGNOSIS — R109 Unspecified abdominal pain: Secondary | ICD-10-CM | POA: Diagnosis present

## 2021-07-28 DIAGNOSIS — N1831 Chronic kidney disease, stage 3a: Secondary | ICD-10-CM | POA: Diagnosis present

## 2021-07-28 DIAGNOSIS — I428 Other cardiomyopathies: Secondary | ICD-10-CM | POA: Diagnosis present

## 2021-07-28 DIAGNOSIS — I472 Ventricular tachycardia, unspecified: Secondary | ICD-10-CM | POA: Diagnosis present

## 2021-07-28 DIAGNOSIS — I130 Hypertensive heart and chronic kidney disease with heart failure and stage 1 through stage 4 chronic kidney disease, or unspecified chronic kidney disease: Secondary | ICD-10-CM | POA: Diagnosis not present

## 2021-07-28 DIAGNOSIS — I10 Essential (primary) hypertension: Secondary | ICD-10-CM | POA: Diagnosis present

## 2021-07-28 DIAGNOSIS — M545 Low back pain, unspecified: Secondary | ICD-10-CM | POA: Diagnosis present

## 2021-07-28 DIAGNOSIS — H409 Unspecified glaucoma: Secondary | ICD-10-CM | POA: Diagnosis present

## 2021-07-28 DIAGNOSIS — N182 Chronic kidney disease, stage 2 (mild): Secondary | ICD-10-CM | POA: Diagnosis present

## 2021-07-28 DIAGNOSIS — F32A Depression, unspecified: Secondary | ICD-10-CM | POA: Diagnosis present

## 2021-07-28 DIAGNOSIS — F17210 Nicotine dependence, cigarettes, uncomplicated: Secondary | ICD-10-CM | POA: Diagnosis present

## 2021-07-28 DIAGNOSIS — J449 Chronic obstructive pulmonary disease, unspecified: Secondary | ICD-10-CM | POA: Diagnosis present

## 2021-07-28 DIAGNOSIS — Z79899 Other long term (current) drug therapy: Secondary | ICD-10-CM

## 2021-07-28 DIAGNOSIS — K219 Gastro-esophageal reflux disease without esophagitis: Secondary | ICD-10-CM | POA: Diagnosis present

## 2021-07-28 DIAGNOSIS — Z806 Family history of leukemia: Secondary | ICD-10-CM

## 2021-07-28 DIAGNOSIS — Z833 Family history of diabetes mellitus: Secondary | ICD-10-CM

## 2021-07-28 DIAGNOSIS — F17200 Nicotine dependence, unspecified, uncomplicated: Secondary | ICD-10-CM | POA: Diagnosis present

## 2021-07-28 DIAGNOSIS — I5043 Acute on chronic combined systolic (congestive) and diastolic (congestive) heart failure: Secondary | ICD-10-CM | POA: Diagnosis present

## 2021-07-28 LAB — CBC
HCT: 42 % (ref 39.0–52.0)
HCT: 39.1 % (ref 39.0–52.0)
Hemoglobin: 13 g/dL (ref 13.0–17.0)
Hemoglobin: 12.9 g/dL — ABNORMAL LOW (ref 13.0–17.0)
MCH: 28.4 pg (ref 26.0–34.0)
MCH: 28.9 pg (ref 26.0–34.0)
MCHC: 31 g/dL (ref 30.0–36.0)
MCHC: 33 g/dL (ref 30.0–36.0)
MCV: 91.7 fL (ref 80.0–100.0)
MCV: 87.7 fL (ref 80.0–100.0)
Platelets: 228 10*3/uL (ref 150–400)
Platelets: 211 10*3/uL (ref 150–400)
RBC: 4.58 MIL/uL (ref 4.22–5.81)
RBC: 4.46 MIL/uL (ref 4.22–5.81)
RDW: 17.2 % — ABNORMAL HIGH (ref 11.5–15.5)
RDW: 17.1 % — ABNORMAL HIGH (ref 11.5–15.5)
WBC: 6.4 10*3/uL (ref 4.0–10.5)
WBC: 6.7 10*3/uL (ref 4.0–10.5)
nRBC: 0 % (ref 0.0–0.2)
nRBC: 0 % (ref 0.0–0.2)

## 2021-07-28 LAB — URINALYSIS, ROUTINE W REFLEX MICROSCOPIC
Bilirubin Urine: NEGATIVE
Glucose, UA: NEGATIVE mg/dL
Hgb urine dipstick: NEGATIVE
Ketones, ur: NEGATIVE mg/dL
Nitrite: NEGATIVE
Protein, ur: NEGATIVE mg/dL
Specific Gravity, Urine: 1.009 (ref 1.005–1.030)
pH: 5 (ref 5.0–8.0)

## 2021-07-28 LAB — BRAIN NATRIURETIC PEPTIDE: B Natriuretic Peptide: 2637.5 pg/mL — ABNORMAL HIGH (ref 0.0–100.0)

## 2021-07-28 LAB — CREATININE, SERUM
Creatinine, Ser: 1.37 mg/dL — ABNORMAL HIGH (ref 0.61–1.24)
GFR, Estimated: 55 mL/min — ABNORMAL LOW (ref >60)

## 2021-07-28 LAB — BASIC METABOLIC PANEL
Anion gap: 9 (ref 5–15)
BUN: 14 mg/dL (ref 8–23)
CO2: 18 mmol/L — ABNORMAL LOW (ref 22–32)
Calcium: 9 mg/dL (ref 8.9–10.3)
Chloride: 110 mmol/L (ref 98–111)
Creatinine, Ser: 1.25 mg/dL — ABNORMAL HIGH (ref 0.61–1.24)
GFR, Estimated: >60 mL/min (ref >60)
Glucose, Bld: 96 mg/dL (ref 70–99)
Potassium: 4.2 mmol/L (ref 3.5–5.1)
Sodium: 137 mmol/L (ref 135–145)

## 2021-07-28 LAB — LACTIC ACID, PLASMA
Lactic Acid, Venous: 1.8 mmol/L (ref 0.5–1.9)
Lactic Acid, Venous: 1.3 mmol/L (ref 0.5–1.9)

## 2021-07-28 LAB — GLUCOSE, CAPILLARY: Glucose-Capillary: 182 mg/dL — ABNORMAL HIGH (ref 70–99)

## 2021-07-28 LAB — TROPONIN I (HIGH SENSITIVITY)
Troponin I (High Sensitivity): 13 ng/L (ref <18)
Troponin I (High Sensitivity): 14 ng/L (ref <18)

## 2021-07-28 LAB — DIGOXIN LEVEL: Digoxin Level: <0.2 ng/mL — ABNORMAL LOW (ref 0.8–2.0)

## 2021-07-28 MED ORDER — TRAMADOL HCL 50 MG PO TABS
50.0000 mg | ORAL_TABLET | Freq: Once | ORAL | Status: AC
  Administered 2021-07-28: 50 mg via ORAL
  Filled 2021-07-28: qty 1

## 2021-07-28 MED ORDER — SPIRONOLACTONE 12.5 MG HALF TABLET
12.5000 mg | ORAL_TABLET | Freq: Every day | ORAL | Status: DC
  Administered 2021-07-29: 12.5 mg via ORAL
  Filled 2021-07-28: qty 1

## 2021-07-28 MED ORDER — IVABRADINE HCL 5 MG PO TABS
5.0000 mg | ORAL_TABLET | Freq: Two times a day (BID) | ORAL | Status: DC
  Filled 2021-07-28: qty 1

## 2021-07-28 MED ORDER — ACETAMINOPHEN 325 MG PO TABS
650.0000 mg | ORAL_TABLET | ORAL | Status: DC | PRN
  Administered 2021-07-29 – 2021-08-04 (×3): 650 mg via ORAL
  Filled 2021-07-28 (×4): qty 2

## 2021-07-28 MED ORDER — OXYCODONE-ACETAMINOPHEN 5-325 MG PO TABS
2.0000 | ORAL_TABLET | Freq: Four times a day (QID) | ORAL | Status: DC | PRN
  Administered 2021-07-28 – 2021-08-04 (×17): 2 via ORAL
  Filled 2021-07-28 (×17): qty 2

## 2021-07-28 MED ORDER — ROSUVASTATIN CALCIUM 20 MG PO TABS
20.0000 mg | ORAL_TABLET | Freq: Every day | ORAL | Status: DC
  Administered 2021-07-29 – 2021-08-04 (×7): 20 mg via ORAL
  Filled 2021-07-28 (×7): qty 1

## 2021-07-28 MED ORDER — HEPARIN SODIUM (PORCINE) 5000 UNIT/ML IJ SOLN
5000.0000 [IU] | Freq: Three times a day (TID) | INTRAMUSCULAR | Status: DC
  Administered 2021-07-29: 5000 [IU] via SUBCUTANEOUS
  Filled 2021-07-28 (×5): qty 1

## 2021-07-28 MED ORDER — GABAPENTIN 300 MG PO CAPS: 600.0000 mg | ORAL_CAPSULE | Freq: Every day | ORAL | Status: DC | PRN

## 2021-07-28 MED ORDER — GABAPENTIN 300 MG PO CAPS
1200.0000 mg | ORAL_CAPSULE | Freq: Two times a day (BID) | ORAL | Status: DC
  Administered 2021-07-28 – 2021-07-30 (×5): 1200 mg via ORAL
  Filled 2021-07-28 (×6): qty 4

## 2021-07-28 MED ORDER — NITROGLYCERIN 0.4 MG SL SUBL
0.4000 mg | SUBLINGUAL_TABLET | SUBLINGUAL | Status: DC | PRN
  Filled 2021-07-28: qty 1

## 2021-07-28 MED ORDER — FUROSEMIDE 10 MG/ML IJ SOLN
40.0000 mg | Freq: Once | INTRAMUSCULAR | Status: AC
  Administered 2021-07-28: 40 mg via INTRAVENOUS
  Filled 2021-07-28: qty 4

## 2021-07-28 MED ORDER — FUROSEMIDE 10 MG/ML IJ SOLN
40.0000 mg | Freq: Two times a day (BID) | INTRAMUSCULAR | Status: DC
  Administered 2021-07-28 – 2021-08-04 (×14): 40 mg via INTRAVENOUS
  Filled 2021-07-28 (×14): qty 4

## 2021-07-28 MED ORDER — ONDANSETRON HCL 4 MG/2ML IJ SOLN
4.0000 mg | Freq: Four times a day (QID) | INTRAMUSCULAR | Status: DC | PRN
  Administered 2021-08-01 – 2021-08-03 (×3): 4 mg via INTRAVENOUS
  Filled 2021-07-28 (×3): qty 2

## 2021-07-28 MED ORDER — FERROUS SULFATE 325 (65 FE) MG PO TABS
325.0000 mg | ORAL_TABLET | Freq: Two times a day (BID) | ORAL | Status: DC
  Administered 2021-07-29 – 2021-08-04 (×13): 325 mg via ORAL
  Filled 2021-07-28 (×13): qty 1

## 2021-07-28 MED ORDER — PANTOPRAZOLE SODIUM 40 MG PO TBEC
40.0000 mg | DELAYED_RELEASE_TABLET | Freq: Every day | ORAL | Status: DC
  Administered 2021-07-29 – 2021-08-04 (×7): 40 mg via ORAL
  Filled 2021-07-28 (×7): qty 1

## 2021-07-28 NOTE — ED Provider Notes (Signed)
[facility] EMERGENCY DEPARTMENT Provider Note   CSN: [id number] Arrival date & time: 07/28/21  1215     History  Chief Complaint  Patient presents with   Shortness of Breath    Ryan [name] is a 71 y.o. male.  71 yo M with a chief complaints of difficulty breathing.  Feels like his prior heart failure exacerbations to him.  Having some orthopnea.  He has been taking his Lasix as prescribed but has not had significant improvement.  Things have been bad for a while but got worse last night.  He was unable to sleep because her tummy laid back he woke up and felt like he is having trouble breathing.  Has had worsening breathing today with exertion.  Has had some chest discomfort with this as well.  Denies cough congestion or fever.  Had an appointment today with his cardiologist but felt he was unable to make it and so came here for evaluation.   Shortness of Breath     Home Medications Prior to Admission medications   Medication Sig Start Date End Date Taking? Authorizing Provider  albuterol (VENTOLIN HFA) 108 (90 Base) MCG/ACT inhaler Inhale 2 puffs into the lungs every 6 (six) hours as needed for wheezing or shortness of breath.   Yes [provider]  carvedilol (COREG) 3.125 MG tablet Take 3.125 mg by mouth 2 (two) times daily with a meal.   Yes [provider]  digoxin (LANOXIN) 0.125 MG tablet Take 0.125 mg by mouth daily.   Yes [provider]  DOCUSATE SODIUM PO Take 2 capsules by mouth daily as needed for constipation.   Yes [provider]  dorzolamide-timolol (COSOPT) 22.3-6.8 MG/ML ophthalmic solution Place 1 drop into both eyes 2 (two) times daily.   Yes [provider]  ferrous sulfate 325 (65 FE) MG tablet Take 325 mg by mouth 2 (two) times daily with a meal.   Yes [provider]  furosemide (LASIX) 20 MG tablet Take 1 tablet (20 mg total) by mouth daily. Patient taking differently: Take 20 mg by  mouth every morning. 07/07/21  Yes Dwyane Dee, MD  gabapentin (NEURONTIN) 300 MG capsule Take 600 mg by mouth See admin instructions. Take two capsules (600 mg) by mouth twice daily, may take a 3rd dose (600 mg) midday as needed for pain   Yes [provider]  latanoprost (XALATAN) 0.005 % ophthalmic solution Place 1 drop into both eyes at bedtime.   Yes [provider]  lipase/protease/amylase (CREON) 12000-38000 units CPEP capsule Take 12,000 Units by mouth 3 (three) times daily with meals. Take with a 24000 unit capsule   Yes [provider]  loratadine (CLARITIN) 10 MG tablet Take 10 mg by mouth at bedtime.   Yes [provider]  midodrine (PROAMATINE) 5 MG tablet Take 1 tablet (5 mg total) by mouth 3 (three) times daily with meals. 07/06/21  Yes Dwyane Dee, MD  Netarsudil Dimesylate 0.02 % SOLN Place 1 drop into both eyes at bedtime.   Yes [provider]  Pancrelipase, Lip-Prot-Amyl, (CREON) 24000-76000 units CPEP Take 24,000 Units by mouth 3 (three) times daily with meals. Take with a 12000 unit capsule   Yes [provider]  pantoprazole (PROTONIX) 40 MG tablet Take 1 tablet (40 mg total) by mouth daily. Patient taking differently: Take 40 mg by mouth at bedtime. 10/08/19  Yes Nuala Alpha A, PA-C  potassium chloride (KLOR-CON) 10 MEQ tablet Take 10 mEq by  mouth every morning.   Yes [provider]  rosuvastatin (CRESTOR) 20 MG tablet Take 20 mg by mouth daily.   Yes [provider]  sacubitril-valsartan (ENTRESTO) 24-26 MG Take 1 tablet by mouth 2 (two) times daily.   Yes [provider]  spironolactone (ALDACTONE) 25 MG tablet Take 0.5 tablets (12.5 mg total) by mouth daily. Patient taking differently: Take 12.5 mg by mouth every morning. 07/06/21  Yes Dwyane Dee, MD  traMADol (ULTRAM) 50 MG tablet Take 100 mg by mouth 2 (two) times daily as needed (pain).   Yes [provider]  albuterol  (PROVENTIL) (2.5 MG/3ML) 0.083% nebulizer solution Take 2.5 mg by nebulization every 6 (six) hours as needed for wheezing or shortness of breath. Patient not taking: Reported on 07/28/2021    [provider]      Allergies    Tape    Review of Systems   Review of Systems  Respiratory:  Positive for shortness of breath.    Physical Exam Updated Vital Signs BP 122/85   Pulse 100   Temp 97.8 F (36.6 C) (Oral)   Resp 11   Ht '5\' 11"'$  (1.803 m)   Wt 75.8 kg   SpO2 100%   BMI 23.29 kg/m  Physical Exam Vitals and nursing note reviewed.  Constitutional:      Appearance: He is well-developed.  HENT:     Head: Normocephalic and atraumatic.  Eyes:     Pupils: Pupils are equal, round, and reactive to light.  Neck:     Vascular: No JVD.  Cardiovascular:     Rate and Rhythm: Normal rate and regular rhythm.     Heart sounds: No murmur heard.   No friction rub. No gallop.  Pulmonary:     Effort: Tachypnea present. No respiratory distress.     Breath sounds: Examination of the right-middle field reveals rales. Examination of the left-middle field reveals rales. Examination of the right-lower field reveals rales. Examination of the left-lower field reveals rales. Rales present. No wheezing.     Comments: Rales up to the midlung worse on the right than the left.  JVD to the angle of the jaw bilaterally. Abdominal:     General: There is no distension.     Tenderness: There is no abdominal tenderness. There is no guarding or rebound.  Musculoskeletal:        General: Normal range of motion.     Cervical back: Normal range of motion and neck supple.     Right lower leg: No edema.     Left lower leg: No edema.  Skin:    Coloration: Skin is not pale.     Findings: No rash.  Neurological:     Mental Status: He is alert and oriented to person, place, and time.  Psychiatric:        Behavior: Behavior normal.    ED Results / Procedures / Treatments   Labs (all labs ordered are  listed, but only abnormal results are displayed) Labs Reviewed  BASIC METABOLIC PANEL - Abnormal; Notable for the following components:      Result Value   CO2 18 (*)    Creatinine, Ser 1.25 (*)    All other components within normal limits  CBC - Abnormal; Notable for the following components:   RDW 17.2 (*)    All other components within normal limits  BRAIN NATRIURETIC PEPTIDE - Abnormal; Notable for the following components:   B Natriuretic Peptide 2,637.5 (*)  All other components within normal limits  LACTIC ACID, PLASMA  LACTIC ACID, PLASMA  URINALYSIS, ROUTINE W REFLEX MICROSCOPIC  TROPONIN I (HIGH SENSITIVITY)  TROPONIN I (HIGH SENSITIVITY)    EKG EKG Interpretation  Date/Time:  Thursday Jul 28 2021 12:18:56 EDT Ventricular Rate:  109 PR Interval:  178 QRS Duration: 104 QT Interval:  356 QTC Calculation: 479 R Axis:   91 Text Interpretation: Sinus tachycardia Rightward axis Pulmonary disease pattern Abnormal ECG No significant change since last tracing Confirmed by Deno Etienne [phone]) on 07/28/2021 12:54:47 PM  Radiology DG Chest 2 View  Result Date: 07/28/2021 CLINICAL DATA:  Shortness of breath. EXAM: CHEST - 2 VIEW COMPARISON:  Radiographs 06/27/2021.  CT 06/28/2021. FINDINGS: The heart is enlarged. Partially loculated right pleural effusion has increased in volume. There is a small left pleural effusion. Underlying mild scarring or atelectasis at both lung bases without edema or confluent airspace opacity. Old rib fractures noted on the right. No acute osseous findings are seen. IMPRESSION: Cardiomegaly with increased right greater than left pleural effusions. No edema or new airspace disease. Electronically Signed   By: Richardean Sale M.D.   On: 07/28/2021 12:49    Procedures Procedures    Medications Ordered in ED Medications  furosemide (LASIX) injection 40 mg (40 mg Intravenous Given 07/28/21 1419)    ED Course/ Medical Decision Making/ A&P                            Medical Decision Making Amount and/or Complexity of Data Reviewed Labs: ordered. Radiology: ordered.  Risk Prescription drug management. Decision regarding hospitalization.   71 yo M with a cc of difficulty breathing.  Per the patient this feels like a heart failure exacerbation.  He does clinically appear to be fluid overloaded.  Chest x-ray independently interpreted by me with worsening pulmonary edema.  We will check lab work on him.  Bolus of IV diuretics.  Will discuss with cardiology.  No significant electrolyte abnormality.  Mild bump in his renal function.  No significant anemia.  He does have a metabolic acidosis without known etiology. Will add a lactate and ua.   Spoke with Wannetta Sender, cardmaster.  Recommends involvement of heartfailure APP.   The patients results and plan were reviewed and discussed.   Any x-rays performed were independently reviewed by myself.   Differential diagnosis were considered with the presenting HPI.  Medications  furosemide (LASIX) injection 40 mg (40 mg Intravenous Given 07/28/21 1419)    Vitals:   07/28/21 1220 07/28/21 1251 07/28/21 1300 07/28/21 1345  BP: 116/85  112/84 122/85  Pulse: (!) 109  (!) 107 100  Resp: (!) 22  (!) 23 11  Temp: 97.8 F (36.6 C)     TempSrc: Oral     SpO2: 99%  100% 100%  Weight:  75.8 kg    Height:  '5\' 11"'$  (1.803 m)      Final diagnoses:  Acute on chronic systolic congestive heart failure (HCC)    Admission/ observation were discussed with the admitting physician, patient and/or family and they are comfortable with the plan.          Final Clinical Impression(s) / ED Diagnoses Final diagnoses:  Acute on chronic systolic congestive heart failure [facility])    Rx / DC Orders ED Discharge Orders     None         Deno Etienne, DO 07/28/21 1453

## 2021-07-28 NOTE — ED Notes (Signed)
Patient transported to X-ray 

## 2021-07-28 NOTE — ED Provider Triage Note (Signed)
Emergency Medicine Provider Triage Evaluation Note  Ryan [name] , a 71 y.o. male  was evaluated in triage.  Pt complains of shortness of breath. Intermittent chest pain and lightheadedness. Symptoms have been going on for several months but worsened in the past few days.  Intermittent lower extremity swelling.  No fever.  Review of Systems  Positive: Shortness of breath chest pain, lightheadedness Negative: Fever  Physical Exam  BP 116/85 (BP Location: Right Arm)   Pulse (!) 109   Temp 97.8 F (36.6 C) (Oral)   Resp (!) 22   SpO2 99%  Gen:   Awake, no distress   Resp:  Normal effort  MSK:   Moves extremities without difficulty  Other:  Lungs clear  Medical Decision Making  Medically screening exam initiated at 12:29 PM.  Appropriate orders placed.  DIRON HADDON was informed that the remainder of the evaluation will be completed by another provider, this initial triage assessment does not replace that evaluation, and the importance of remaining in the ED until their evaluation is complete.  Labs ordered   Delia Heady, [place] 07/28/21 1231
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
## 2021-07-28 NOTE — ED Triage Notes (Signed)
Patient here with complaint of shortness of breath, history of CHF. Patient has appointment at 1400 today with the heart failure clinic but states he came to the ED instead. Patient speaking in complete sentences, room air SpO2 100%, is alert, oriented, and in no apparent distress at this time.

## 2021-07-28 NOTE — ED Notes (Signed)
Received verbal report from [place] at this time

## 2021-07-29 ENCOUNTER — Observation Stay (HOSPITAL_COMMUNITY): Payer: No Typology Code available for payment source

## 2021-07-29 DIAGNOSIS — N179 Acute kidney failure, unspecified: Secondary | ICD-10-CM

## 2021-07-29 DIAGNOSIS — I5082 Biventricular heart failure: Secondary | ICD-10-CM | POA: Diagnosis present

## 2021-07-29 DIAGNOSIS — N1831 Chronic kidney disease, stage 3a: Secondary | ICD-10-CM | POA: Diagnosis present

## 2021-07-29 DIAGNOSIS — I5023 Acute on chronic systolic (congestive) heart failure: Secondary | ICD-10-CM | POA: Diagnosis present

## 2021-07-29 DIAGNOSIS — H409 Unspecified glaucoma: Secondary | ICD-10-CM | POA: Diagnosis present

## 2021-07-29 DIAGNOSIS — Z79899 Other long term (current) drug therapy: Secondary | ICD-10-CM | POA: Diagnosis not present

## 2021-07-29 DIAGNOSIS — Z833 Family history of diabetes mellitus: Secondary | ICD-10-CM | POA: Diagnosis not present

## 2021-07-29 DIAGNOSIS — I272 Pulmonary hypertension, unspecified: Secondary | ICD-10-CM | POA: Diagnosis present

## 2021-07-29 DIAGNOSIS — I130 Hypertensive heart and chronic kidney disease with heart failure and stage 1 through stage 4 chronic kidney disease, or unspecified chronic kidney disease: Secondary | ICD-10-CM | POA: Diagnosis present

## 2021-07-29 DIAGNOSIS — Z806 Family history of leukemia: Secondary | ICD-10-CM | POA: Diagnosis not present

## 2021-07-29 DIAGNOSIS — I5043 Acute on chronic combined systolic (congestive) and diastolic (congestive) heart failure: Secondary | ICD-10-CM | POA: Diagnosis present

## 2021-07-29 DIAGNOSIS — Z720 Tobacco use: Secondary | ICD-10-CM | POA: Diagnosis not present

## 2021-07-29 DIAGNOSIS — M545 Low back pain, unspecified: Secondary | ICD-10-CM | POA: Diagnosis present

## 2021-07-29 DIAGNOSIS — J9601 Acute respiratory failure with hypoxia: Secondary | ICD-10-CM | POA: Diagnosis present

## 2021-07-29 DIAGNOSIS — K860 Alcohol-induced chronic pancreatitis: Secondary | ICD-10-CM | POA: Diagnosis present

## 2021-07-29 DIAGNOSIS — I4729 Other ventricular tachycardia: Secondary | ICD-10-CM | POA: Diagnosis not present

## 2021-07-29 DIAGNOSIS — J449 Chronic obstructive pulmonary disease, unspecified: Secondary | ICD-10-CM | POA: Diagnosis present

## 2021-07-29 DIAGNOSIS — K219 Gastro-esophageal reflux disease without esophagitis: Secondary | ICD-10-CM | POA: Diagnosis present

## 2021-07-29 DIAGNOSIS — F1010 Alcohol abuse, uncomplicated: Secondary | ICD-10-CM | POA: Diagnosis present

## 2021-07-29 DIAGNOSIS — I472 Ventricular tachycardia, unspecified: Secondary | ICD-10-CM | POA: Diagnosis present

## 2021-07-29 DIAGNOSIS — Z91048 Other nonmedicinal substance allergy status: Secondary | ICD-10-CM | POA: Diagnosis not present

## 2021-07-29 DIAGNOSIS — F32A Depression, unspecified: Secondary | ICD-10-CM | POA: Diagnosis present

## 2021-07-29 DIAGNOSIS — I428 Other cardiomyopathies: Secondary | ICD-10-CM | POA: Diagnosis present

## 2021-07-29 DIAGNOSIS — F17210 Nicotine dependence, cigarettes, uncomplicated: Secondary | ICD-10-CM | POA: Diagnosis present

## 2021-07-29 DIAGNOSIS — G8929 Other chronic pain: Secondary | ICD-10-CM | POA: Diagnosis present

## 2021-07-29 LAB — CBC
HCT: 38.6 % — ABNORMAL LOW (ref 39.0–52.0)
Hemoglobin: 12.3 g/dL — ABNORMAL LOW (ref 13.0–17.0)
MCH: 28.3 pg (ref 26.0–34.0)
MCHC: 31.9 g/dL (ref 30.0–36.0)
MCV: 88.9 fL (ref 80.0–100.0)
Platelets: 207 10*3/uL (ref 150–400)
RBC: 4.34 MIL/uL (ref 4.22–5.81)
RDW: 16.8 % — ABNORMAL HIGH (ref 11.5–15.5)
WBC: 5.8 10*3/uL (ref 4.0–10.5)
nRBC: 0 % (ref 0.0–0.2)

## 2021-07-29 LAB — BASIC METABOLIC PANEL
Anion gap: 8 (ref 5–15)
BUN: 15 mg/dL (ref 8–23)
CO2: 20 mmol/L — ABNORMAL LOW (ref 22–32)
Calcium: 8.8 mg/dL — ABNORMAL LOW (ref 8.9–10.3)
Chloride: 110 mmol/L (ref 98–111)
Creatinine, Ser: 1.36 mg/dL — ABNORMAL HIGH (ref 0.61–1.24)
GFR, Estimated: 56 mL/min — ABNORMAL LOW (ref >60)
Glucose, Bld: 109 mg/dL — ABNORMAL HIGH (ref 70–99)
Potassium: 3.6 mmol/L (ref 3.5–5.1)
Sodium: 138 mmol/L (ref 135–145)

## 2021-07-29 LAB — HEPATIC FUNCTION PANEL
ALT: 21 U/L (ref 0–44)
AST: 29 U/L (ref 15–41)
Albumin: 3.1 g/dL — ABNORMAL LOW (ref 3.5–5.0)
Alkaline Phosphatase: 137 U/L — ABNORMAL HIGH (ref 38–126)
Bilirubin, Direct: 0.1 mg/dL (ref 0.0–0.2)
Indirect Bilirubin: 0.5 mg/dL (ref 0.3–0.9)
Total Bilirubin: 0.6 mg/dL (ref 0.3–1.2)
Total Protein: 6.4 g/dL — ABNORMAL LOW (ref 6.5–8.1)

## 2021-07-29 LAB — MAGNESIUM: Magnesium: 1.9 mg/dL (ref 1.7–2.4)

## 2021-07-29 LAB — ECHOCARDIOGRAM COMPLETE
AR max vel: 3.39 cm2
AV Area VTI: 3.07 cm2
AV Area mean vel: 2.99 cm2
AV Mean grad: 1 mmHg
AV Peak grad: 2.3 mmHg
Ao pk vel: 0.75 m/s
Area-P 1/2: 8.25 cm2
Calc EF: 8.5 %
Height: 71 in
MV M vel: 3.7 m/s
MV Peak grad: 54.8 mmHg
Radius: 0.5 cm
S' Lateral: 6.45 cm
Single Plane A2C EF: 12.2 %
Single Plane A4C EF: 3.8 %
Weight: 2737.23 oz

## 2021-07-29 LAB — LIPASE, BLOOD: Lipase: 26 U/L (ref 11–51)

## 2021-07-29 LAB — MRSA NEXT GEN BY PCR, NASAL: MRSA by PCR Next Gen: DETECTED — AB

## 2021-07-29 MED ORDER — CHLORHEXIDINE GLUCONATE CLOTH 2 % EX PADS
6.0000 | MEDICATED_PAD | Freq: Every day | CUTANEOUS | Status: DC
  Administered 2021-07-29: 6 via TOPICAL

## 2021-07-29 MED ORDER — POTASSIUM CHLORIDE CRYS ER 20 MEQ PO TBCR
40.0000 meq | EXTENDED_RELEASE_TABLET | Freq: Once | ORAL | Status: AC
  Administered 2021-07-29: 40 meq via ORAL
  Filled 2021-07-29: qty 2

## 2021-07-29 MED ORDER — IVABRADINE HCL 5 MG PO TABS
5.0000 mg | ORAL_TABLET | Freq: Two times a day (BID) | ORAL | Status: DC
Start: 2021-07-29 — End: 2021-08-04
  Administered 2021-07-29 – 2021-08-04 (×13): 5 mg via ORAL
  Filled 2021-07-29 (×13): qty 1

## 2021-07-29 MED ORDER — PNEUMOCOCCAL 20-VAL CONJ VACC 0.5 ML IM SUSY: 0.5000 mL | PREFILLED_SYRINGE | INTRAMUSCULAR | Status: DC | PRN

## 2021-07-29 MED ORDER — MUPIROCIN 2 % EX OINT
1.0000 "application " | TOPICAL_OINTMENT | Freq: Two times a day (BID) | CUTANEOUS | Status: AC
  Administered 2021-07-29 – 2021-08-02 (×10): 1 via NASAL
  Filled 2021-07-29: qty 22

## 2021-07-29 MED ORDER — ORAL CARE MOUTH RINSE
15.0000 mL | Freq: Two times a day (BID) | OROMUCOSAL | Status: DC
  Administered 2021-07-29 – 2021-08-04 (×8): 15 mL via OROMUCOSAL

## 2021-07-29 MED ORDER — MAGNESIUM OXIDE -MG SUPPLEMENT 400 (240 MG) MG PO TABS
400.0000 mg | ORAL_TABLET | Freq: Every day | ORAL | Status: DC
Start: 2021-07-29 — End: 2021-08-04
  Administered 2021-07-29 – 2021-08-04 (×7): 400 mg via ORAL
  Filled 2021-07-29 (×7): qty 1

## 2021-07-29 NOTE — Progress Notes (Addendum)
Progress Note  Patient Name: Ryan [name] Date of Encounter: 07/29/2021  [facility] Cardiologist: Carlyle Dolly, MD   Subjective   Incomplete I/O's but recorded as -700 cc yesterday.  Weight down 1 pound from yesterday.  BP 97/67 this morning.  Creatinine stable (1.25 > 1.37 > 1.36).  Reports dyspnea mildly improved but continues to feel short of breath  Inpatient Medications    Scheduled Meds:  Chlorhexidine Gluconate Cloth  6 each Topical Q0600   ferrous sulfate  325 mg Oral BID WC   furosemide  40 mg Intravenous BID   gabapentin  1,200 mg Oral BID   heparin  5,000 Units Subcutaneous Q8H   mouth rinse  15 mL Mouth Rinse BID   mupirocin ointment  1 application. Nasal BID   pantoprazole  40 mg Oral Daily   rosuvastatin  20 mg Oral Daily   spironolactone  12.5 mg Oral Daily   Continuous Infusions:  PRN Meds: acetaminophen, gabapentin, nitroGLYCERIN, ondansetron (ZOFRAN) IV, oxyCODONE-acetaminophen, pneumococcal 20-valent conjugate vaccine   Vital Signs    Vitals:   07/29/21 0007 07/29/21 0434 07/29/21 0446 07/29/21 0740  BP: 115/74 100/71  97/67  Pulse: 98 87  100  Resp: '20 16  20  '$ Temp: 98 F (36.7 C) 97.9 F (36.6 C)  97.6 F (36.4 C)  TempSrc: Oral Oral  Oral  SpO2: 98% 99%  95%  Weight:   77.6 kg   Height:        Intake/Output Summary (Last 24 hours) at 07/29/2021 0840 Last data filed at 07/29/2021 0800 Gross per 24 hour  Intake 960 ml  Output 1425 ml  Net -465 ml      07/29/2021    4:46 AM 07/28/2021    8:00 PM 07/28/2021   12:51 PM  Last 3 Weights  Weight (lbs) 171 lb 1.2 oz 172 lb 6.4 oz 167 lb  Weight (kg) 77.6 kg 78.2 kg 75.751 kg      Telemetry    Normal sinus rhythm rate 90s to 100s, NSVT x15 beats- Personally Reviewed  ECG    No new ECG- Personally Reviewed  Physical Exam   GEN: No acute distress.   Neck:+ JVD Cardiac: Regular, tachycardic, no murmurs Respiratory: Bibasilar crackles GI: Soft, nontender MS: No edema; No  deformity. Neuro:  Nonfocal  Psych: Normal affect   Labs    High Sensitivity Troponin:   Recent Labs  Lab 07/28/21 1230 07/28/21 1418  TROPONINIHS 13 14     Chemistry Recent Labs  Lab 07/28/21 1230 07/28/21 2231 07/29/21 0052  NA 137  --  138  K 4.2  --  3.6  CL 110  --  110  CO2 18*  --  20*  GLUCOSE 96  --  109*  BUN 14  --  15  CREATININE 1.25* 1.37* 1.36*  CALCIUM 9.0  --  8.8*  GFRNONAA >60 55* 56*  ANIONGAP 9  --  8    Lipids No results for input(s): CHOL, TRIG, HDL, LABVLDL, LDLCALC, CHOLHDL in the last 168 hours.  Hematology Recent Labs  Lab 07/28/21 1230 07/28/21 2231 07/29/21 0052  WBC 6.4 6.7 5.8  RBC 4.58 4.46 4.34  HGB 13.0 12.9* 12.3*  HCT 42.0 39.1 38.6*  MCV 91.7 87.7 88.9  MCH 28.4 28.9 28.3  MCHC 31.0 33.0 31.9  RDW 17.2* 17.1* 16.8*  PLT 228 211 207   Thyroid No results for input(s): TSH, FREET4 in the last 168 hours.  BNP Recent Labs  Lab 07/28/21 1230  BNP 2,637.5*    DDimer No results for input(s): DDIMER in the last 168 hours.   Radiology    DG Chest 2 View  Result Date: 07/28/2021 CLINICAL DATA:  Shortness of breath. EXAM: CHEST - 2 VIEW COMPARISON:  Radiographs 06/27/2021.  CT 06/28/2021. FINDINGS: The heart is enlarged. Partially loculated right pleural effusion has increased in volume. There is a small left pleural effusion. Underlying mild scarring or atelectasis at both lung bases without edema or confluent airspace opacity. Old rib fractures noted on the right. No acute osseous findings are seen. IMPRESSION: Cardiomegaly with increased right greater than left pleural effusions. No edema or new airspace disease. Electronically Signed   By: Richardean Sale M.D.   On: 07/28/2021 12:49    Cardiac Studies     Patient Profile     71 y.o. male with chronic systolic congestive heart failure, nonischemic cardiomyopathy, pulmonary hypertension (mild), hypertension, tobacco use, and prior history of alcohol abuse with chronic  pancreatitis s/p biliary stent who presents to the ER for worsening dyspnea found to have acute on chronic combined systolic and diastolic heart failure exacerbation.  Assessment & Plan    Acute on chronic combined heart failure: EF 15 to 20% on echo 11/2020. Nonischemic cardiomyopathy. Overloaded on exam. BNP and chest x-ray suggestive of heart failure exacerbation.  -Will update Echo  -Continue IV Lasix 40 mg twice daily -Strict INO and daily weight -Continue home Aldactone 12.5 mg daily  -Continue home Corlanor -Midodrine held on admission given SBP greater than 100, will monitor BP   AKI: Baseline creatinine 1.0-1.1.  Creatinine 1.36 today.  Monitor with diuresis   Tobacco abuse: Recommended cessation   Chest tightness: Troponin negative. Prior cath in 2018 showed normal coronaries.  Abdominal pain: Reports chronic abdominal pain likely due to chronic pancreatitis.  Check LFTs, lipase  NSVT: 15 beats overnight.  Maintain K>4, Mag>2  For questions or updates, please contact [place] Please consult [email or web address] for contact info under        Signed, Donato Heinz, MD  07/29/2021, 8:40 AM

## 2021-07-29 NOTE — Progress Notes (Signed)
Echocardiogram 2D Echocardiogram has been performed.  Joette Catching 07/29/2021, 9:38 AM

## 2021-07-29 NOTE — TOC Initial Note (Signed)
Transition of Care [facility]) - Initial/Assessment Note    Patient Details  Name: Ryan [name] MRN: [id number] Date of Birth: 07-07-1950  Transition of Care [facility]) CM/SW Contact:    Angelita Ingles, RN Phone [phone]  07/29/2021, 10:24 AM  Clinical Narrative:                  Transition of Care [facility]) Screening Note   Patient Details  Name: Ryan [name] Date of Birth: Apr 29, 1950   Transition of Care [facility]) CM/SW Contact:    Angelita Ingles, RN Phone Number: 07/29/2021, 10:24 AM    Transition of Care Department (TOC) has reviewed patient and no TOC needs have been identified at this time. We will continue to monitor patient advancement through interdisciplinary progression rounds. If new patient transition needs arise, please place a TOC consult.           Patient Goals and CMS Choice        Expected Discharge Plan and Services                                                Prior Living Arrangements/Services                       Activities of Daily Living Home Assistive Devices/Equipment: Cane (specify quad or straight) ADL Screening (condition at time of admission) Patient's cognitive ability adequate to safely complete daily activities?: Yes Is the patient deaf or have difficulty hearing?: No Does the patient have difficulty seeing, even when wearing glasses/contacts?: No Does the patient have difficulty concentrating, remembering, or making decisions?: No Patient able to express need for assistance with ADLs?: Yes Does the patient have difficulty dressing or bathing?: No Independently performs ADLs?: Yes (appropriate for developmental age) Does the patient have difficulty walking or climbing stairs?: Yes Weakness of Legs: Both Weakness of Arms/Hands: None  Permission Sought/Granted                  Emotional Assessment              Admission diagnosis:  Acute on chronic systolic congestive heart failure (HCC)  [I50.23] Acute on chronic systolic (congestive) heart failure ([place]) [I50.23] Patient Active Problem List   Diagnosis Date Noted   NSVT (nonsustained ventricular tachycardia) ([place])    Acute on chronic systolic (congestive) heart failure ([place]) 07/28/2021   Acute respiratory failure with hypoxia ([place]) 06/28/2021   Enteritis 06/28/2021   Abdominal pain 06/28/2021   COPD with acute exacerbation ([place]) 12/06/2020   Constipation    Dilation of biliary tract    Dilation of pancreatic duct    Anemia    Pain    Pancreatic pseudocyst    Alcohol abuse    Chronic pancreatitis ([place]) 10/25/2020   Acute pancreatitis 10/24/2020   Chronic HFrEF (heart failure with reduced ejection fraction) ([place])    Adenomatous polyp of descending colon    Nonischemic cardiomyopathy ([place])    Alcohol use disorder    AKI (acute kidney injury) ([place]) 11/27/2015   Hypokalemia 11/27/2015   Acute on chronic combined systolic and diastolic CHF (congestive heart failure) ([place]) 11/22/2015   COPD (chronic obstructive pulmonary disease) ([place]) 12/19/2013   CKD (chronic kidney disease), stage II 12/03/2013   Post-traumatic osteoarthritis of left hip 11/26/2013   Post-traumatic osteoarthritis of left knee 11/26/2013  Glaucoma 04/23/2013   Low back pain 08/29/2010   Tobacco use disorder 07/08/2010   Essential hypertension 05/16/2010   PCP:  [facility], [place]:   [id number] [place] [id number]: [phone]: [phone] [address] [place]Phone: [phone]: [phone]    Social Determinants of Health (SDOH) Interventions    Readmission Risk Interventions    12/03/2020    1:04 PM  Readmission Risk Prevention Plan  Transportation Screening Complete  HRI or Home Care Consult Complete  Social Work Consult for RSpartaPlanning/Counseling Complete  Palliative Care Screening Not Applicable  Medication Review (Press photographer Complete

## 2021-07-29 NOTE — Plan of Care (Signed)

## 2021-07-30 DIAGNOSIS — I5043 Acute on chronic combined systolic (congestive) and diastolic (congestive) heart failure: Secondary | ICD-10-CM | POA: Diagnosis not present

## 2021-07-30 DIAGNOSIS — N179 Acute kidney failure, unspecified: Secondary | ICD-10-CM | POA: Diagnosis not present

## 2021-07-30 DIAGNOSIS — I4729 Other ventricular tachycardia: Secondary | ICD-10-CM | POA: Diagnosis not present

## 2021-07-30 LAB — BASIC METABOLIC PANEL
Anion gap: 9 (ref 5–15)
BUN: 21 mg/dL (ref 8–23)
CO2: 22 mmol/L (ref 22–32)
Calcium: 8.9 mg/dL (ref 8.9–10.3)
Chloride: 106 mmol/L (ref 98–111)
Creatinine, Ser: 1.4 mg/dL — ABNORMAL HIGH (ref 0.61–1.24)
GFR, Estimated: 54 mL/min — ABNORMAL LOW (ref >60)
Glucose, Bld: 144 mg/dL — ABNORMAL HIGH (ref 70–99)
Potassium: 4.3 mmol/L (ref 3.5–5.1)
Sodium: 137 mmol/L (ref 135–145)

## 2021-07-30 LAB — MAGNESIUM: Magnesium: 2.1 mg/dL (ref 1.7–2.4)

## 2021-07-30 NOTE — Progress Notes (Signed)
Progress Note  Patient Name: Ryan [name] Date of Encounter: 07/30/2021  [facility] Cardiologist: Carlyle Dolly, MD   Subjective   Net -1.8 L yesterday.  BP 102/84 this morning.  Creatinine stable (1.25 > 1.37 > 1.36 > 1.40).  Reports dyspnea improved but continues to feel short of breath.  Reports intermittent chest pain lasting about 5 seconds   Inpatient Medications    Scheduled Meds:  Chlorhexidine Gluconate Cloth  6 each Topical Q0600   ferrous sulfate  325 mg Oral BID WC   furosemide  40 mg Intravenous BID   gabapentin  1,200 mg Oral BID   heparin  5,000 Units Subcutaneous Q8H   ivabradine  5 mg Oral BID WC   magnesium oxide  400 mg Oral Daily   mouth rinse  15 mL Mouth Rinse BID   mupirocin ointment  1 application. Nasal BID   pantoprazole  40 mg Oral Daily   rosuvastatin  20 mg Oral Daily   spironolactone  12.5 mg Oral Daily   Continuous Infusions:  PRN Meds: acetaminophen, gabapentin, nitroGLYCERIN, ondansetron (ZOFRAN) IV, oxyCODONE-acetaminophen, pneumococcal 20-valent conjugate vaccine   Vital Signs    Vitals:   07/29/21 2003 07/29/21 2352 07/30/21 0353 07/30/21 0500  BP: 102/84 110/83 93/66   Pulse: 88 90 79   Resp: '20 15 19   '$ Temp: 98.1 F (36.7 C) 98 F (36.7 C) 97.7 F (36.5 C)   TempSrc: Oral Oral Oral   SpO2: 99% 100% 97%   Weight:    77.6 kg  Height:        Intake/Output Summary (Last 24 hours) at 07/30/2021 0725 Last data filed at 07/30/2021 9735 Gross per 24 hour  Intake 1290 ml  Output 3075 ml  Net -1785 ml       07/30/2021    5:00 AM 07/29/2021    4:46 AM 07/28/2021    8:00 PM  Last 3 Weights  Weight (lbs) 171 lb 1.2 oz 171 lb 1.2 oz 172 lb 6.4 oz  Weight (kg) 77.6 kg 77.6 kg 78.2 kg      Telemetry    Normal sinus rhythm rate 80s- Personally Reviewed  ECG    No new ECG- Personally Reviewed  Physical Exam   GEN: No acute distress.   Neck:+ JVD Cardiac: Regular, tachycardic, no murmurs Respiratory: Bibasilar  crackles GI: Soft, nontender MS: No edema; No deformity. Neuro:  Nonfocal  Psych: Normal affect   Labs    High Sensitivity Troponin:   Recent Labs  Lab 07/28/21 1230 07/28/21 1418  TROPONINIHS 13 14      Chemistry Recent Labs  Lab 07/28/21 1230 07/28/21 2231 07/29/21 0052 07/30/21 0041  NA 137  --  138 137  K 4.2  --  3.6 4.3  CL 110  --  110 106  CO2 18*  --  20* 22  GLUCOSE 96  --  109* 144*  BUN 14  --  15 21  CREATININE 1.25* 1.37* 1.36* 1.40*  CALCIUM 9.0  --  8.8* 8.9  MG  --   --  1.9 2.1  PROT  --   --  6.4*  --   ALBUMIN  --   --  3.1*  --   AST  --   --  29  --   ALT  --   --  21  --   ALKPHOS  --   --  137*  --   BILITOT  --   --  0.6  --   GFRNONAA >60 55* 56* 54*  ANIONGAP 9  --  8 9     Lipids No results for input(s): CHOL, TRIG, HDL, LABVLDL, LDLCALC, CHOLHDL in the last 168 hours.  Hematology Recent Labs  Lab 07/28/21 1230 07/28/21 2231 07/29/21 0052  WBC 6.4 6.7 5.8  RBC 4.58 4.46 4.34  HGB 13.0 12.9* 12.3*  HCT 42.0 39.1 38.6*  MCV 91.7 87.7 88.9  MCH 28.4 28.9 28.3  MCHC 31.0 33.0 31.9  RDW 17.2* 17.1* 16.8*  PLT 228 211 207    Thyroid No results for input(s): TSH, FREET4 in the last 168 hours.  BNP Recent Labs  Lab 07/28/21 1230  BNP 2,637.5*     DDimer No results for input(s): DDIMER in the last 168 hours.   Radiology    DG Chest 2 View  Result Date: 07/28/2021 CLINICAL DATA:  Shortness of breath. EXAM: CHEST - 2 VIEW COMPARISON:  Radiographs 06/27/2021.  CT 06/28/2021. FINDINGS: The heart is enlarged. Partially loculated right pleural effusion has increased in volume. There is a small left pleural effusion. Underlying mild scarring or atelectasis at both lung bases without edema or confluent airspace opacity. Old rib fractures noted on the right. No acute osseous findings are seen. IMPRESSION: Cardiomegaly with increased right greater than left pleural effusions. No edema or new airspace disease. Electronically Signed    By: Richardean Sale M.D.   On: 07/28/2021 12:49   ECHOCARDIOGRAM COMPLETE  Result Date: 07/29/2021    ECHOCARDIOGRAM REPORT   Patient Name:   Ryan [name] Date of Exam: 07/29/2021 Medical Rec #:  [id number]       Height:       71.0 in Accession #:    [id number]      Weight:       171.1 lb Date of Birth:  1950-07-05       BSA:          1.973 m Patient Age:    71 years        BP:           97/67 mmHg Patient Gender: M               HR:           97 bpm. Exam Location:  Inpatient Procedure: 2D Echo, Cardiac Doppler and Color Doppler Indications:    CHF  History:        Patient has prior history of Echocardiogram examinations, most                 recent 11/29/2020. CHF, COPD; Risk Factors:Hypertension.  Sonographer:    Joette Catching RCS Referring Phys: [phone] [place]  1. Left ventricular ejection fraction, by estimation, is <20%. The left ventricle has severely decreased function. The left ventricle demonstrates global hypokinesis. The left ventricular internal cavity size was moderately dilated. Left ventricular diastolic parameters are consistent with Grade III diastolic dysfunction (restrictive).  2. Right ventricular systolic function is severely reduced. The right ventricular size is mildly enlarged. There is moderately elevated pulmonary artery systolic pressure. The estimated right ventricular systolic pressure is 81.8 mmHg.  3. Left atrial size was moderately dilated.  4. Right atrial size was moderately dilated.  5. The mitral valve is normal in structure. Moderate mitral valve regurgitation. No evidence of mitral stenosis.  6. The aortic valve is tricuspid. Aortic valve regurgitation is not visualized. No aortic stenosis is present.  7. Aortic dilatation noted. There  is mild dilatation of the aortic root, measuring 37 mm.  8. The inferior vena cava is dilated in size with <50% respiratory variability, suggesting right atrial pressure of 15 mmHg. FINDINGS  Left Ventricle: Left  ventricular ejection fraction, by estimation, is <20%. The left ventricle has severely decreased function. The left ventricle demonstrates global hypokinesis. The left ventricular internal cavity size was moderately dilated. There is no left ventricular hypertrophy. Left ventricular diastolic parameters are consistent with Grade III diastolic dysfunction (restrictive). Right Ventricle: The right ventricular size is mildly enlarged. No increase in right ventricular wall thickness. Right ventricular systolic function is severely reduced. There is moderately elevated pulmonary artery systolic pressure. The tricuspid regurgitant velocity is 2.76 m/s, and with an assumed right atrial pressure of 15 mmHg, the estimated right ventricular systolic pressure is 09.4 mmHg. Left Atrium: Left atrial size was moderately dilated. Right Atrium: Right atrial size was moderately dilated. Pericardium: There is no evidence of pericardial effusion. Mitral Valve: The mitral valve is normal in structure. Moderate mitral valve regurgitation. No evidence of mitral valve stenosis. Tricuspid Valve: The tricuspid valve is normal in structure. Tricuspid valve regurgitation is mild. Aortic Valve: The aortic valve is tricuspid. Aortic valve regurgitation is not visualized. No aortic stenosis is present. Aortic valve mean gradient measures 1.0 mmHg. Aortic valve peak gradient measures 2.3 mmHg. Aortic valve area, by VTI measures 3.07 cm. Pulmonic Valve: The pulmonic valve was normal in structure. Pulmonic valve regurgitation is trivial. Aorta: Aortic dilatation noted. There is mild dilatation of the aortic root, measuring 37 mm. Venous: The inferior vena cava is dilated in size with less than 50% respiratory variability, suggesting right atrial pressure of 15 mmHg. IAS/Shunts: No atrial level shunt detected by color flow Doppler.  LEFT VENTRICLE PLAX 2D LVIDd:         6.60 cm      Diastology LVIDs:         6.45 cm      LV e' lateral:   6.53 cm/s  LV PW:         0.85 cm      LV E/e' lateral: 11.4 LV IVS:        0.80 cm LVOT diam:     2.10 cm LV SV:         33 LV SV Index:   17 LVOT Area:     3.46 cm  LV Volumes (MOD) LV vol d, MOD A2C: 238.0 ml LV vol d, MOD A4C: 263.0 ml LV vol s, MOD A2C: 209.0 ml LV vol s, MOD A4C: 253.0 ml LV SV MOD A2C:     29.0 ml LV SV MOD A4C:     263.0 ml LV SV MOD BP:      22.2 ml RIGHT VENTRICLE            IVC RV Basal diam:  4.50 cm    IVC diam: 2.40 cm RV Mid diam:    2.80 cm RV S prime:     7.40 cm/s TAPSE (M-mode): 1.2 cm LEFT ATRIUM              Index        RIGHT ATRIUM           Index LA diam:        4.80 cm  2.43 cm/m   RA Area:     29.10 cm LA Vol (A2C):   107.0 ml 54.23 ml/m  RA Volume:   103.00 ml 52.20 ml/m LA  Vol (A4C):   76.7 ml  38.87 ml/m LA Biplane Vol: 90.1 ml  45.66 ml/m  AORTIC VALVE                    PULMONIC VALVE AV Area (Vmax):    3.39 cm     PV Vmax:          0.63 m/s AV Area (Vmean):   2.99 cm     PV Peak grad:     1.6 mmHg AV Area (VTI):     3.07 cm     PR End Diast Vel: 6.86 msec AV Vmax:           75.40 cm/s AV Vmean:          58.100 cm/s AV VTI:            0.107 m AV Peak Grad:      2.3 mmHg AV Mean Grad:      1.0 mmHg LVOT Vmax:         73.80 cm/s LVOT Vmean:        50.200 cm/s LVOT VTI:          0.095 m LVOT/AV VTI ratio: 0.89  AORTA Ao Root diam: 3.70 cm Ao Asc diam:  3.20 cm MITRAL VALVE                  TRICUSPID VALVE MV Area (PHT): 8.25 cm       TR Peak grad:   30.5 mmHg MV Decel Time: 92 msec        TR Vmax:        276.00 cm/s MR Peak grad:    54.8 mmHg MR Mean grad:    42.0 mmHg    SHUNTS MR Vmax:         370.00 cm/s  Systemic VTI:  0.09 m MR Vmean:        316.0 cm/s   Systemic Diam: 2.10 cm MR PISA:         1.57 cm MR PISA Eff ROA: 16 mm MR PISA Radius:  0.50 cm MV E velocity: 74.60 cm/s Dalton McleanMD Electronically signed by Franki Monte Signature Date/Time: 07/29/2021/5:59:37 PM    Final     Cardiac Studies   Echo 07/29/21:  1. Left ventricular ejection fraction, by  estimation, is <20%. The left  ventricle has severely decreased function. The left ventricle demonstrates  global hypokinesis. The left ventricular internal cavity size was  moderately dilated. Left ventricular  diastolic parameters are consistent with Grade III diastolic dysfunction  (restrictive).   2. Right ventricular systolic function is severely reduced. The right  ventricular size is mildly enlarged. There is moderately elevated  pulmonary artery systolic pressure. The estimated right ventricular  systolic pressure is 65.7 mmHg.   3. Left atrial size was moderately dilated.   4. Right atrial size was moderately dilated.   5. The mitral valve is normal in structure. Moderate mitral valve  regurgitation. No evidence of mitral stenosis.   6. The aortic valve is tricuspid. Aortic valve regurgitation is not  visualized. No aortic stenosis is present.   7. Aortic dilatation noted. There is mild dilatation of the aortic root,  measuring 37 mm.   8. The inferior vena cava is dilated in size with <50% respiratory  variability, suggesting right atrial pressure of 15 mmHg.   Patient Profile     71 y.o. male with chronic systolic congestive heart failure, nonischemic cardiomyopathy, pulmonary hypertension (mild), hypertension,  tobacco use, and prior history of alcohol abuse with chronic pancreatitis s/p biliary stent who presents to the ER for worsening dyspnea found to have acute on chronic combined systolic and diastolic heart failure exacerbation.  Assessment & Plan    Acute on chronic combined heart failure: EF 15 to 20% on echo 11/2020. Nonischemic cardiomyopathy. Overloaded on exam. BNP and chest x-ray suggestive of heart failure exacerbation.  Echo 5/19 shows EF less than 87%, grade 3 diastolic dysfunction, severe RV dysfunction, RVSP 45, moderate MR, RAP 15 -Continue IV Lasix 40 mg twice daily -Strict INO and daily weight -Continue home [place] -Hold home spironolactone given soft  BP -Midodrine held on admission given SBP greater than 100, will monitor BP, may need to add back   AKI: Baseline creatinine 1.0-1.2.  Creatinine 1.40 today.  Monitor with diuresis   Tobacco abuse: Recommended cessation   Chest tightness: Troponin negative. Prior cath in 2018 showed normal coronaries.  Abdominal pain: Reports chronic abdominal pain likely due to chronic pancreatitis.  LFTs, lipase unremarkable  NSVT: Maintain K>4, Mag>2  For questions or updates, please contact [place] Please consult [email or web address] for contact info under        Signed, Donato Heinz, MD  07/30/2021, 7:25 AM

## 2021-07-31 DIAGNOSIS — I5043 Acute on chronic combined systolic (congestive) and diastolic (congestive) heart failure: Secondary | ICD-10-CM | POA: Diagnosis not present

## 2021-07-31 DIAGNOSIS — I4729 Other ventricular tachycardia: Secondary | ICD-10-CM | POA: Diagnosis not present

## 2021-07-31 DIAGNOSIS — N179 Acute kidney failure, unspecified: Secondary | ICD-10-CM | POA: Diagnosis not present

## 2021-07-31 LAB — BASIC METABOLIC PANEL
Anion gap: 8 (ref 5–15)
BUN: 20 mg/dL (ref 8–23)
CO2: 26 mmol/L (ref 22–32)
Calcium: 8.8 mg/dL — ABNORMAL LOW (ref 8.9–10.3)
Chloride: 107 mmol/L (ref 98–111)
Creatinine, Ser: 1.42 mg/dL — ABNORMAL HIGH (ref 0.61–1.24)
GFR, Estimated: 53 mL/min — ABNORMAL LOW (ref >60)
Glucose, Bld: 116 mg/dL — ABNORMAL HIGH (ref 70–99)
Potassium: 3.8 mmol/L (ref 3.5–5.1)
Sodium: 141 mmol/L (ref 135–145)

## 2021-07-31 LAB — MAGNESIUM: Magnesium: 1.9 mg/dL (ref 1.7–2.4)

## 2021-07-31 MED ORDER — GABAPENTIN 300 MG PO CAPS
600.0000 mg | ORAL_CAPSULE | Freq: Two times a day (BID) | ORAL | Status: DC
  Administered 2021-07-31 – 2021-08-04 (×9): 600 mg via ORAL
  Filled 2021-07-31 (×9): qty 2

## 2021-07-31 MED ORDER — ALBUTEROL SULFATE (2.5 MG/3ML) 0.083% IN NEBU
2.5000 mg | INHALATION_SOLUTION | Freq: Four times a day (QID) | RESPIRATORY_TRACT | Status: DC | PRN
  Administered 2021-07-31: 2.5 mg via RESPIRATORY_TRACT
  Filled 2021-07-31: qty 3

## 2021-07-31 MED ORDER — POTASSIUM CHLORIDE CRYS ER 20 MEQ PO TBCR
20.0000 meq | EXTENDED_RELEASE_TABLET | Freq: Once | ORAL | Status: AC
  Administered 2021-07-31: 20 meq via ORAL
  Filled 2021-07-31: qty 1

## 2021-07-31 MED ORDER — ENOXAPARIN SODIUM 40 MG/0.4ML IJ SOSY
40.0000 mg | PREFILLED_SYRINGE | INTRAMUSCULAR | Status: DC
  Filled 2021-07-31: qty 0.4

## 2021-07-31 NOTE — Plan of Care (Signed)

## 2021-07-31 NOTE — Progress Notes (Addendum)
Progress Note  Patient Name: JACQUE GARRELS Date of Encounter: 07/31/2021  [facility] Cardiologist: Carlyle Dolly, MD   Subjective   Net -1.6 L yesterday, -4.1 L on admission.  BP 108/67 this morning.  Creatinine stable (1.25 > 1.37 > 1.36 > 1.40 >1.42).  Reports continues to feel short of breath but improving   Inpatient Medications    Scheduled Meds:  Chlorhexidine Gluconate Cloth  6 each Topical Q0600   ferrous sulfate  325 mg Oral BID WC   furosemide  40 mg Intravenous BID   gabapentin  1,200 mg Oral BID   heparin  5,000 Units Subcutaneous Q8H   ivabradine  5 mg Oral BID WC   magnesium oxide  400 mg Oral Daily   mouth rinse  15 mL Mouth Rinse BID   mupirocin ointment  1 application. Nasal BID   pantoprazole  40 mg Oral Daily   rosuvastatin  20 mg Oral Daily   Continuous Infusions:  PRN Meds: acetaminophen, gabapentin, nitroGLYCERIN, ondansetron (ZOFRAN) IV, oxyCODONE-acetaminophen, pneumococcal 20-valent conjugate vaccine   Vital Signs    Vitals:   07/30/21 2000 07/30/21 2352 07/31/21 0358 07/31/21 0810  BP: 106/81 106/77 98/69 108/67  Pulse: 82 88 80 80  Resp: '19 20 20 16  '$ Temp: 98.2 F (36.8 C) 98.7 F (37.1 C) 98.2 F (36.8 C)   TempSrc: Oral Oral Oral   SpO2: 98% 100% 100% 100%  Weight:   77 kg   Height:        Intake/Output Summary (Last 24 hours) at 07/31/2021 0825 Last data filed at 07/31/2021 0401 Gross per 24 hour  Intake 420 ml  Output 2050 ml  Net -1630 ml       07/31/2021    3:58 AM 07/30/2021    5:00 AM 07/29/2021    4:46 AM  Last 3 Weights  Weight (lbs) 169 lb 12.1 oz 171 lb 1.2 oz 171 lb 1.2 oz  Weight (kg) 77 kg 77.6 kg 77.6 kg      Telemetry    Normal sinus rhythm rate 70-80s- Personally Reviewed  ECG    No new ECG- Personally Reviewed  Physical Exam   GEN: No acute distress.   Neck:+ JVD Cardiac: RRR, no murmurs Respiratory: Diminished breath sounds at bases GI: Soft, nontender MS: No edema; No  deformity. Neuro:  Nonfocal  Psych: Normal affect   Labs    High Sensitivity Troponin:   Recent Labs  Lab 07/28/21 1230 07/28/21 1418  TROPONINIHS 13 14      Chemistry Recent Labs  Lab 07/29/21 0052 07/30/21 0041 07/31/21 0119  NA 138 137 141  K 3.6 4.3 3.8  CL 110 106 107  CO2 20* 22 26  GLUCOSE 109* 144* 116*  BUN '15 21 20  '$ CREATININE 1.36* 1.40* 1.42*  CALCIUM 8.8* 8.9 8.8*  MG 1.9 2.1 1.9  PROT 6.4*  --   --   ALBUMIN 3.1*  --   --   AST 29  --   --   ALT 21  --   --   ALKPHOS 137*  --   --   BILITOT 0.6  --   --   GFRNONAA 56* 54* 53*  ANIONGAP '8 9 8     '$ Lipids No results for input(s): CHOL, TRIG, HDL, LABVLDL, LDLCALC, CHOLHDL in the last 168 hours.  Hematology Recent Labs  Lab 07/28/21 1230 07/28/21 2231 07/29/21 0052  WBC 6.4 6.7 5.8  RBC 4.58 4.46 4.34  HGB  13.0 12.9* 12.3*  HCT 42.0 39.1 38.6*  MCV 91.7 87.7 88.9  MCH 28.4 28.9 28.3  MCHC 31.0 33.0 31.9  RDW 17.2* 17.1* 16.8*  PLT 228 211 207    Thyroid No results for input(s): TSH, FREET4 in the last 168 hours.  BNP Recent Labs  Lab 07/28/21 1230  BNP 2,637.5*     DDimer No results for input(s): DDIMER in the last 168 hours.   Radiology    ECHOCARDIOGRAM COMPLETE  Result Date: 07/29/2021    ECHOCARDIOGRAM REPORT   Patient Name:   STANFORD STRAUCH Date of Exam: 07/29/2021 Medical Rec #:  [id number]       Height:       71.0 in Accession #:    [id number]      Weight:       171.1 lb Date of Birth:  [birth year]       BSA:          1.973 m Patient Age:    5 years        BP:           97/67 mmHg Patient Gender: M               HR:           97 bpm. Exam Location:  Inpatient Procedure: 2D Echo, Cardiac Doppler and Color Doppler Indications:    CHF  History:        Patient has prior history of Echocardiogram examinations, most                 recent 11/29/2020. CHF, COPD; Risk Factors:Hypertension.  Sonographer:    Joette Catching RCS Referring Phys: [phone] [place]  1.  Left ventricular ejection fraction, by estimation, is <20%. The left ventricle has severely decreased function. The left ventricle demonstrates global hypokinesis. The left ventricular internal cavity size was moderately dilated. Left ventricular diastolic parameters are consistent with Grade III diastolic dysfunction (restrictive).  2. Right ventricular systolic function is severely reduced. The right ventricular size is mildly enlarged. There is moderately elevated pulmonary artery systolic pressure. The estimated right ventricular systolic pressure is 09.2 mmHg.  3. Left atrial size was moderately dilated.  4. Right atrial size was moderately dilated.  5. The mitral valve is normal in structure. Moderate mitral valve regurgitation. No evidence of mitral stenosis.  6. The aortic valve is tricuspid. Aortic valve regurgitation is not visualized. No aortic stenosis is present.  7. Aortic dilatation noted. There is mild dilatation of the aortic root, measuring 37 mm.  8. The inferior vena cava is dilated in size with <50% respiratory variability, suggesting right atrial pressure of 15 mmHg. FINDINGS  Left Ventricle: Left ventricular ejection fraction, by estimation, is <20%. The left ventricle has severely decreased function. The left ventricle demonstrates global hypokinesis. The left ventricular internal cavity size was moderately dilated. There is no left ventricular hypertrophy. Left ventricular diastolic parameters are consistent with Grade III diastolic dysfunction (restrictive). Right Ventricle: The right ventricular size is mildly enlarged. No increase in right ventricular wall thickness. Right ventricular systolic function is severely reduced. There is moderately elevated pulmonary artery systolic pressure. The tricuspid regurgitant velocity is 2.76 m/s, and with an assumed right atrial pressure of 15 mmHg, the estimated right ventricular systolic pressure is 33.0 mmHg. Left Atrium: Left atrial size was  moderately dilated. Right Atrium: Right atrial size was moderately dilated. Pericardium: There is no evidence of pericardial effusion. Mitral Valve: The mitral  valve is normal in structure. Moderate mitral valve regurgitation. No evidence of mitral valve stenosis. Tricuspid Valve: The tricuspid valve is normal in structure. Tricuspid valve regurgitation is mild. Aortic Valve: The aortic valve is tricuspid. Aortic valve regurgitation is not visualized. No aortic stenosis is present. Aortic valve mean gradient measures 1.0 mmHg. Aortic valve peak gradient measures 2.3 mmHg. Aortic valve area, by VTI measures 3.07 cm. Pulmonic Valve: The pulmonic valve was normal in structure. Pulmonic valve regurgitation is trivial. Aorta: Aortic dilatation noted. There is mild dilatation of the aortic root, measuring 37 mm. Venous: The inferior vena cava is dilated in size with less than 50% respiratory variability, suggesting right atrial pressure of 15 mmHg. IAS/Shunts: No atrial level shunt detected by color flow Doppler.  LEFT VENTRICLE PLAX 2D LVIDd:         6.60 cm      Diastology LVIDs:         6.45 cm      LV e' lateral:   6.53 cm/s LV PW:         0.85 cm      LV E/e' lateral: 11.4 LV IVS:        0.80 cm LVOT diam:     2.10 cm LV SV:         33 LV SV Index:   17 LVOT Area:     3.46 cm  LV Volumes (MOD) LV vol d, MOD A2C: 238.0 ml LV vol d, MOD A4C: 263.0 ml LV vol s, MOD A2C: 209.0 ml LV vol s, MOD A4C: 253.0 ml LV SV MOD A2C:     29.0 ml LV SV MOD A4C:     263.0 ml LV SV MOD BP:      22.2 ml RIGHT VENTRICLE            IVC RV Basal diam:  4.50 cm    IVC diam: 2.40 cm RV Mid diam:    2.80 cm RV S prime:     7.40 cm/s TAPSE (M-mode): 1.2 cm LEFT ATRIUM              Index        RIGHT ATRIUM           Index LA diam:        4.80 cm  2.43 cm/m   RA Area:     29.10 cm LA Vol (A2C):   107.0 ml 54.23 ml/m  RA Volume:   103.00 ml 52.20 ml/m LA Vol (A4C):   76.7 ml  38.87 ml/m LA Biplane Vol: 90.1 ml  45.66 ml/m  AORTIC VALVE                     PULMONIC VALVE AV Area (Vmax):    3.39 cm     PV Vmax:          0.63 m/s AV Area (Vmean):   2.99 cm     PV Peak grad:     1.6 mmHg AV Area (VTI):     3.07 cm     PR End Diast Vel: 6.86 msec AV Vmax:           75.40 cm/s AV Vmean:          58.100 cm/s AV VTI:            0.107 m AV Peak Grad:      2.3 mmHg AV Mean Grad:      1.0 mmHg LVOT Vmax:  73.80 cm/s LVOT Vmean:        50.200 cm/s LVOT VTI:          0.095 m LVOT/AV VTI ratio: 0.89  AORTA Ao Root diam: 3.70 cm Ao Asc diam:  3.20 cm MITRAL VALVE                  TRICUSPID VALVE MV Area (PHT): 8.25 cm       TR Peak grad:   30.5 mmHg MV Decel Time: 92 msec        TR Vmax:        276.00 cm/s MR Peak grad:    54.8 mmHg MR Mean grad:    42.0 mmHg    SHUNTS MR Vmax:         370.00 cm/s  Systemic VTI:  0.09 m MR Vmean:        316.0 cm/s   Systemic Diam: 2.10 cm MR PISA:         1.57 cm MR PISA Eff ROA: 16 mm MR PISA Radius:  0.50 cm MV E velocity: 74.60 cm/s Dalton McleanMD Electronically signed by Franki Monte Signature Date/Time: 07/29/2021/5:59:37 PM    Final     Cardiac Studies   Echo 07/29/21:  1. Left ventricular ejection fraction, by estimation, is <20%. The left  ventricle has severely decreased function. The left ventricle demonstrates  global hypokinesis. The left ventricular internal cavity size was  moderately dilated. Left ventricular  diastolic parameters are consistent with Grade III diastolic dysfunction  (restrictive).   2. Right ventricular systolic function is severely reduced. The right  ventricular size is mildly enlarged. There is moderately elevated  pulmonary artery systolic pressure. The estimated right ventricular  systolic pressure is 47.4 mmHg.   3. Left atrial size was moderately dilated.   4. Right atrial size was moderately dilated.   5. The mitral valve is normal in structure. Moderate mitral valve  regurgitation. No evidence of mitral stenosis.   6. The aortic valve is tricuspid. Aortic  valve regurgitation is not  visualized. No aortic stenosis is present.   7. Aortic dilatation noted. There is mild dilatation of the aortic root,  measuring 37 mm.   8. The inferior vena cava is dilated in size with <50% respiratory  variability, suggesting right atrial pressure of 15 mmHg.   Patient Profile     71 y.o. male with chronic systolic congestive heart failure, nonischemic cardiomyopathy, pulmonary hypertension (mild), hypertension, tobacco use, and prior history of alcohol abuse with chronic pancreatitis s/p biliary stent who presents to the ER for worsening dyspnea found to have acute on chronic combined systolic and diastolic heart failure exacerbation.  Assessment & Plan    Acute on chronic combined heart failure: EF 15 to 20% on echo 11/2020. Nonischemic cardiomyopathy. Overloaded on exam. BNP and chest x-ray suggestive of heart failure exacerbation.  Echo 5/19 shows EF less than 25%, grade 3 diastolic dysfunction, severe RV dysfunction, RVSP 45, moderate MR, RAP 15 -Continue IV Lasix 40 mg twice daily -Strict INO and daily weight -Continue home Corlanor -Hold home spironolactone given soft BP -Previously required midodrine but BP has been stable though soft off midodrine this admission.  Will monitor, may need to add back midodrine at some point   AKI: Baseline creatinine 1.0-1.2.  Creatinine 1.42 today.  Monitor with diuresis   Tobacco abuse: Recommended cessation   Chest tightness: Troponin negative. Prior cath in 2018 showed normal coronaries.  Abdominal pain: Reports chronic abdominal pain  likely due to chronic pancreatitis.  LFTs, lipase unremarkable  NSVT: Maintain K>4, Mag>2  Chronic pain: continue home gabapentin  For questions or updates, please contact [place] Please consult [email or web address] for contact info under        Signed, Donato Heinz, MD  07/31/2021, 8:25 AM

## 2021-07-31 NOTE — Progress Notes (Addendum)
Rn notified  at 1248 of 12 beat run of vtach per central tele around 9 am this morning  Pt doesn't recall symptoms  MD notified

## 2021-07-31 NOTE — Progress Notes (Signed)
Pharmacist Heart Failure Core Measure Documentation  Assessment: Ryan [name] has an EF documented as 20%  by ECHO.  Rationale: Heart failure patients with left ventricular systolic dysfunction (LVSD) and an EF < 40% should be prescribed an angiotensin converting enzyme inhibitor (ACEI) or angiotensin receptor blocker (ARB) at discharge unless a contraindication is documented in the medical record.  This patient is not currently on an ACEI or ARB for HF.  This note is being placed in the record in order to provide documentation that a contraindication to the use of these agents is present for this encounter.  ACE Inhibitor or Angiotensin Receptor Blocker is contraindicated (specify all that apply)  '[]'$   ACEI allergy AND ARB allergy '[]'$   Angioedema '[]'$   Moderate or severe aortic stenosis '[]'$   Hyperkalemia '[x]'$   Hypotension '[]'$   Renal artery stenosis '[]'$   Worsening renal function, preexisting renal disease or dysfunction   Bonnita Nasuti Pharm.D. CPP, BCPS Clinical Pharmacist [phone] 07/31/2021 3:13 PM

## 2021-08-01 DIAGNOSIS — Z720 Tobacco use: Secondary | ICD-10-CM | POA: Diagnosis not present

## 2021-08-01 DIAGNOSIS — I5023 Acute on chronic systolic (congestive) heart failure: Secondary | ICD-10-CM | POA: Diagnosis not present

## 2021-08-01 LAB — BASIC METABOLIC PANEL
Anion gap: 9 (ref 5–15)
BUN: 20 mg/dL (ref 8–23)
CO2: 25 mmol/L (ref 22–32)
Calcium: 9 mg/dL (ref 8.9–10.3)
Chloride: 105 mmol/L (ref 98–111)
Creatinine, Ser: 1.55 mg/dL — ABNORMAL HIGH (ref 0.61–1.24)
GFR, Estimated: 48 mL/min — ABNORMAL LOW (ref >60)
Glucose, Bld: 114 mg/dL — ABNORMAL HIGH (ref 70–99)
Potassium: 4 mmol/L (ref 3.5–5.1)
Sodium: 139 mmol/L (ref 135–145)

## 2021-08-01 LAB — MAGNESIUM: Magnesium: 2 mg/dL (ref 1.7–2.4)

## 2021-08-01 NOTE — Plan of Care (Signed)
  Problem: Education: Goal: Knowledge of General Education information will improve Description: Including pain rating scale, medication(s)/side effects and non-pharmacologic comfort measures Outcome: Progressing   Problem: Clinical Measurements: Goal: Will remain free from infection Outcome: Progressing   Problem: Nutrition: Goal: Adequate nutrition will be maintained Outcome: Progressing   Problem: Coping: Goal: Level of anxiety will decrease Outcome: Progressing   Problem: Pain Managment: Goal: General experience of comfort will improve Outcome: Progressing   Problem: Safety: Goal: Ability to remain free from injury will improve Outcome: Progressing

## 2021-08-01 NOTE — Progress Notes (Signed)
Progress Note  Patient Name: Ryan [name] Date of Encounter: 08/01/2021  Primary Cardiologist: Carlyle Dolly, MD   Subjective   Patient seen examined his bedside.  He is lying in bed when I arrived.  He tells me that his shortness of breath has improved-but cannot worsen without oxygen.  Net output over the last 24 hours 745 milliliters.  Blood pressure stable.  Inpatient Medications    Scheduled Meds:  Chlorhexidine Gluconate Cloth  6 each Topical Q0600   enoxaparin (LOVENOX) injection  40 mg Subcutaneous Q24H   ferrous sulfate  325 mg Oral BID WC   furosemide  40 mg Intravenous BID   gabapentin  600 mg Oral BID   ivabradine  5 mg Oral BID WC   magnesium oxide  400 mg Oral Daily   mouth rinse  15 mL Mouth Rinse BID   mupirocin ointment  1 application. Nasal BID   pantoprazole  40 mg Oral Daily   rosuvastatin  20 mg Oral Daily   Continuous Infusions:  PRN Meds: acetaminophen, albuterol, gabapentin, nitroGLYCERIN, ondansetron (ZOFRAN) IV, oxyCODONE-acetaminophen, pneumococcal 20-valent conjugate vaccine   Vital Signs    Vitals:   07/31/21 2026 08/01/21 0000 08/01/21 0500 08/01/21 0741  BP: 107/73 105/72  109/79  Pulse: 77 81  86  Resp: 20 13  (!) 21  Temp: 98.2 F (36.8 C) 98.1 F (36.7 C)  98 F (36.7 C)  TempSrc: Oral Oral  Oral  SpO2: 99% 100%  99%  Weight:  76.6 kg 76.6 kg   Height:        Intake/Output Summary (Last 24 hours) at 08/01/2021 0926 Last data filed at 07/31/2021 1700 Gross per 24 hour  Intake 720 ml  Output 1465 ml  Net -745 ml   Filed Weights   07/31/21 0358 08/01/21 0000 08/01/21 0500  Weight: 77 kg 76.6 kg 76.6 kg    Telemetry    Sinus rhythm- Personally Reviewed  ECG    None today- Personally Reviewed  Physical Exam     General: Comfortable, lying in the bed, with nasal cannula Head: Atraumatic, normal size  Eyes: PEERLA, EOMI  Neck: Supple, normal JVD Cardiac: Normal S1, S2; RRR; no murmurs, rubs, or  gallops Lungs: Clear to auscultation bilaterally Abd: Soft, nontender, no hepatomegaly  Ext: warm, no edema Musculoskeletal: No deformities, BUE and BLE strength normal and equal Skin: Warm and dry, no rashes   Neuro: Alert and oriented to person, place, time, and situation, CNII-XII grossly intact, no focal deficits  Psych: Normal mood and affect   Labs    Chemistry Recent Labs  Lab 07/29/21 0052 07/30/21 0041 07/31/21 0119 08/01/21 0046  NA 138 137 141 139  K 3.6 4.3 3.8 4.0  CL 110 106 107 105  CO2 20* '22 26 25  '$ GLUCOSE 109* 144* 116* 114*  BUN '15 21 20 20  '$ CREATININE 1.36* 1.40* 1.42* 1.55*  CALCIUM 8.8* 8.9 8.8* 9.0  PROT 6.4*  --   --   --   ALBUMIN 3.1*  --   --   --   AST 29  --   --   --   ALT 21  --   --   --   ALKPHOS 137*  --   --   --   BILITOT 0.6  --   --   --   GFRNONAA 56* 54* 53* 48*  ANIONGAP '8 9 8 9     '$ Hematology Recent Labs  Lab 07/28/21 1230  07/28/21 2231 07/29/21 0052  WBC 6.4 6.7 5.8  RBC 4.58 4.46 4.34  HGB 13.0 12.9* 12.3*  HCT 42.0 39.1 38.6*  MCV 91.7 87.7 88.9  MCH 28.4 28.9 28.3  MCHC 31.0 33.0 31.9  RDW 17.2* 17.1* 16.8*  PLT 228 211 207    Cardiac EnzymesNo results for input(s): TROPONINI in the last 168 hours. No results for input(s): TROPIPOC in the last 168 hours.   BNP Recent Labs  Lab 07/28/21 1230  BNP 2,637.5*     DDimer No results for input(s): DDIMER in the last 168 hours.   Radiology    No results found.  Cardiac Studies   Echo 07/29/21:  1. Left ventricular ejection fraction, by estimation, is <20%. The left  ventricle has severely decreased function. The left ventricle demonstrates  global hypokinesis. The left ventricular internal cavity size was  moderately dilated. Left ventricular  diastolic parameters are consistent with Grade III diastolic dysfunction  (restrictive).   2. Right ventricular systolic function is severely reduced. The right  ventricular size is mildly enlarged. There is  moderately elevated  pulmonary artery systolic pressure. The estimated right ventricular  systolic pressure is 32.9 mmHg.   3. Left atrial size was moderately dilated.   4. Right atrial size was moderately dilated.   5. The mitral valve is normal in structure. Moderate mitral valve  regurgitation. No evidence of mitral stenosis.   6. The aortic valve is tricuspid. Aortic valve regurgitation is not  visualized. No aortic stenosis is present.   7. Aortic dilatation noted. There is mild dilatation of the aortic root,  measuring 37 mm.   8. The inferior vena cava is dilated in size with <50% respiratory  variability, suggesting right atrial pressure of 15 mmHg.    Patient Profile     71 y.o. male  with chronic systolic congestive heart failure, nonischemic cardiomyopathy, pulmonary hypertension (mild), hypertension, tobacco use, and prior history of alcohol abuse with chronic pancreatitis s/p biliary stent who presents to the ER for worsening dyspnea found to have acute on chronic combined systolic and diastolic heart failure exacerbation.  Assessment & Plan    Acute on chronic combined heart failure with underlying nonischemic cardiomyopathy-EF less than 20% on echo with severe right ventricular dysfunction and pulmonary hypertension.I will like to keep the patient on IV Lasix for now.  Strict input and output, please continue daily weights. His blood pressure has been low normal-his Aldactone has been held.  He is on midodrine in the outpatient to support his blood pressure.  I will restart his midodrine and if his blood pressure can support he is also on low-dose beta-blocker 3.125 mg carvedilol twice daily as well as Entresto 24-26 mg twice a day.  Respiratory failure-appears to be dependent on oxygen inpatient.  He is not on oxygen at home.  We will walk the patient to determine need for oxygen, likely he will need to be discharged on home O2.  Tobacco use-smoking cessation  advised.  Chest tightness-he had negative troponin previous catheterization with normal coronaries.  Doubt ACS.  Hypertension-low normal blood pressure.  Guideline medical therapy has been held due to low normal blood pressure.  Plan to restart his midodrine today.  Chronic kidney disease stage III-creatinine 1.55 today.  Baseline appears to be 1.0-1.2.  If his creatinine increases tomorrow we will transition him off the p.o. diuretic.  Chronic alcoholic pancreatitis-abdominal pain in the setting of his chronic pancreatitis due to alcohol use.  Recent NSVT-keep mag  above 2 and potassium above 4.  Continue home gabapentin for his chronic pain.   For questions or updates, please contact [place] Please consult [email or web address] for contact info under Cardiology/STEMI.      Signed, Berniece Salines, DO  08/01/2021, 9:26 AM

## 2021-08-01 NOTE — Progress Notes (Signed)
Heart Failure Navigator Progress Note  Assessed for Heart & Vascular TOC clinic readiness.  Patient does not meet criteria due to established with [facility] Heart failure Team.    Earnestine Leys, BSN, RN Heart Failure Nurse Navigator Secure Chat Only

## 2021-08-01 NOTE — Progress Notes (Addendum)
   07/31/21 2307  Pain Assessment  Pain Scale 0-10  Pain Score 7  Pain Type Chronic pain  Pain Location Abdomen  Pain Orientation Medial  Pain Descriptors / Indicators Aching;Sharp  Pain Frequency Constant  Pain Onset On-going  Pain Intervention(s) RN made aware;Medication (See eMAR)  Provider Notification  Provider Name/Title Dr. Marcelle Smiling  Date Provider Notified 08/01/21  Time Provider Notified 0024  Method of Notification Page  Notification Reason Red med refusal   RN text paged MD to notify patient refused Lovenox '40mg'$  subcu injection.

## 2021-08-01 NOTE — Progress Notes (Signed)
SATURATION QUALIFICATIONS: (This note is used to comply with regulatory documentation for home oxygen)  Patient Saturations on Room Air at Rest = 99%  Patient Saturations on Room Air while Ambulating = 97%  Patient Saturations on 3 Liters of oxygen while Ambulating = 66%  Please briefly explain why patient needs home oxygen:  Patient felt winded and short of breath while ambulating with and without supplemental oxygen.

## 2021-08-01 NOTE — Progress Notes (Signed)
Wrong person

## 2021-08-01 NOTE — Progress Notes (Signed)
   08/01/21 2202  Vitals  Pulse Rate 81  ECG Heart Rate 81  Resp 19  Oxygen Therapy  SpO2 100 %  MEWS Score  MEWS Temp 0  MEWS Systolic 0  MEWS Pulse 0  MEWS RR 0  MEWS LOC 0  MEWS Score 0  MEWS Score Color Green  Provider Notification  Provider Name/Title Dr. Alfred Levins  Date Provider Notified 08/01/21  Time Provider Notified 2202  Method of Notification Page  Notification Reason Red med refusal   Text page sent to Dr. Alfred Levins at 2202 for red med refusal as patient refused lovenox '40mg'$  subcutaneous injection.

## 2021-08-01 NOTE — Progress Notes (Signed)
Mobility Specialist Progress Note    08/01/21 1440  Mobility  Activity Ambulated with assistance in hallway  Level of Assistance Contact guard assist, steadying assist  Assistive Device Other (Comment) (forearm crutch)  Distance Ambulated (ft) 200 ft  Activity Response Tolerated fair  $Mobility charge 1 Mobility   Pre-Mobility: 78 HR, 103/72 BP During Mobility: 98% on 2LO2 SpO2 Post-Mobility: 98% on RA SpO2  Pt received in bed and agreeable. Attempted to wean O2, but pleth not reliable. Changed sensor and received reliable 98% SpO2 on 2LO2. Returned to bed with call bell in reach. RN advised to leave on RA for monitoring.   Hildred Alamin Mobility Specialist  Primary: 5N M.S. Phone: [phone] Secondary: 6N M.S. Phone: [phone]

## 2021-08-02 DIAGNOSIS — I5023 Acute on chronic systolic (congestive) heart failure: Secondary | ICD-10-CM | POA: Diagnosis not present

## 2021-08-02 LAB — BASIC METABOLIC PANEL
Anion gap: 8 (ref 5–15)
BUN: 22 mg/dL (ref 8–23)
CO2: 29 mmol/L (ref 22–32)
Calcium: 9 mg/dL (ref 8.9–10.3)
Chloride: 100 mmol/L (ref 98–111)
Creatinine, Ser: 1.35 mg/dL — ABNORMAL HIGH (ref 0.61–1.24)
GFR, Estimated: 56 mL/min — ABNORMAL LOW (ref >60)
Glucose, Bld: 222 mg/dL — ABNORMAL HIGH (ref 70–99)
Potassium: 4 mmol/L (ref 3.5–5.1)
Sodium: 137 mmol/L (ref 135–145)

## 2021-08-02 LAB — MAGNESIUM: Magnesium: 2 mg/dL (ref 1.7–2.4)

## 2021-08-02 MED ORDER — MIDODRINE HCL 5 MG PO TABS
5.0000 mg | ORAL_TABLET | Freq: Three times a day (TID) | ORAL | Status: DC
  Administered 2021-08-02 – 2021-08-04 (×6): 5 mg via ORAL
  Filled 2021-08-02 (×6): qty 1

## 2021-08-02 NOTE — Plan of Care (Signed)

## 2021-08-02 NOTE — Progress Notes (Signed)
Mobility Specialist Progress Note    08/02/21 1559  Mobility  Activity Ambulated with assistance in hallway  Level of Assistance Contact guard assist, steadying assist  Assistive Device Other (Comment) (forearm crutch)  Distance Ambulated (ft) 170 ft  Activity Response Tolerated well  $Mobility charge 1 Mobility   Pre-Mobility: 82 HR, 99% SpO2 Post-Mobility: 88 HR, 99% SpO2  Pt received in bed and agreeable. Ambulated on RA w/ no complaints but pleth not always consistent. Returned to sitting EOB with call bell in reach.   Hildred Alamin Mobility Specialist  Primary: 5N M.S. Phone: [phone] Secondary: 6N M.S. Phone: [phone]

## 2021-08-02 NOTE — Progress Notes (Signed)
Progress Note  Patient Name: Ryan [name] Date of Encounter: 08/02/2021  Primary Cardiologist: Carlyle Dolly, MD   Subjective   Patient seen examined his bedside.  He is lying in bed when.   We will need to 24-hour output -280 mL  Inpatient Medications    Scheduled Meds:  enoxaparin (LOVENOX) injection  40 mg Subcutaneous Q24H   ferrous sulfate  325 mg Oral BID WC   furosemide  40 mg Intravenous BID   gabapentin  600 mg Oral BID   ivabradine  5 mg Oral BID WC   magnesium oxide  400 mg Oral Daily   mouth rinse  15 mL Mouth Rinse BID   mupirocin ointment  1 application. Nasal BID   pantoprazole  40 mg Oral Daily   rosuvastatin  20 mg Oral Daily   Continuous Infusions:  PRN Meds: acetaminophen, albuterol, gabapentin, nitroGLYCERIN, ondansetron (ZOFRAN) IV, oxyCODONE-acetaminophen, pneumococcal 20-valent conjugate vaccine   Vital Signs    Vitals:   08/02/21 0300 08/02/21 0357 08/02/21 0655 08/02/21 0800  BP:  109/73  92/62  Pulse: 72 74 79 71  Resp: '16 14 15 18  '$ Temp:  98.4 F (36.9 C)  97.9 F (36.6 C)  TempSrc:  Oral  Oral  SpO2: 92% 100% 98% 97%  Weight:  76 kg    Height:        Intake/Output Summary (Last 24 hours) at 08/02/2021 0839 Last data filed at 08/02/2021 0800 Gross per 24 hour  Intake 120 ml  Output 400 ml  Net -280 ml   Filed Weights   08/01/21 0000 08/01/21 0500 08/02/21 0357  Weight: 76.6 kg 76.6 kg 76 kg    Telemetry    Sinus rhythm- Personally Reviewed  ECG    None today- Personally Reviewed  Physical Exam     General: Comfortable, lying in the bed, with nasal cannula Head: Atraumatic, normal size  Eyes: PEERLA, EOMI  Neck: Supple, normal JVD Cardiac: Normal S1, S2; RRR; no murmurs, rubs, or gallops Lungs: Clear to auscultation bilaterally Abd: Soft, nontender, no hepatomegaly  Ext: warm, no edema Musculoskeletal: No deformities, BUE and BLE strength normal and equal Skin: Warm and dry, no rashes   Neuro: Alert and  oriented to person, place, time, and situation, CNII-XII grossly intact, no focal deficits  Psych: Normal mood and affect   Labs    Chemistry Recent Labs  Lab 07/29/21 0052 07/30/21 0041 07/31/21 0119 08/01/21 0046  NA 138 137 141 139  K 3.6 4.3 3.8 4.0  CL 110 106 107 105  CO2 20* '22 26 25  '$ GLUCOSE 109* 144* 116* 114*  BUN '15 21 20 20  '$ CREATININE 1.36* 1.40* 1.42* 1.55*  CALCIUM 8.8* 8.9 8.8* 9.0  PROT 6.4*  --   --   --   ALBUMIN 3.1*  --   --   --   AST 29  --   --   --   ALT 21  --   --   --   ALKPHOS 137*  --   --   --   BILITOT 0.6  --   --   --   GFRNONAA 56* 54* 53* 48*  ANIONGAP '8 9 8 9     '$ Hematology Recent Labs  Lab 07/28/21 1230 07/28/21 2231 07/29/21 0052  WBC 6.4 6.7 5.8  RBC 4.58 4.46 4.34  HGB 13.0 12.9* 12.3*  HCT 42.0 39.1 38.6*  MCV 91.7 87.7 88.9  MCH 28.4 28.9 28.3  MCHC  31.0 33.0 31.9  RDW 17.2* 17.1* 16.8*  PLT 228 211 207    Cardiac EnzymesNo results for input(s): TROPONINI in the last 168 hours. No results for input(s): TROPIPOC in the last 168 hours.   BNP Recent Labs  Lab 07/28/21 1230  BNP 2,637.5*     DDimer No results for input(s): DDIMER in the last 168 hours.   Radiology    No results found.  Cardiac Studies   Echo 07/29/21:  1. Left ventricular ejection fraction, by estimation, is <20%. The left  ventricle has severely decreased function. The left ventricle demonstrates  global hypokinesis. The left ventricular internal cavity size was  moderately dilated. Left ventricular  diastolic parameters are consistent with Grade III diastolic dysfunction  (restrictive).   2. Right ventricular systolic function is severely reduced. The right  ventricular size is mildly enlarged. There is moderately elevated  pulmonary artery systolic pressure. The estimated right ventricular  systolic pressure is 62.8 mmHg.   3. Left atrial size was moderately dilated.   4. Right atrial size was moderately dilated.   5. The mitral  valve is normal in structure. Moderate mitral valve  regurgitation. No evidence of mitral stenosis.   6. The aortic valve is tricuspid. Aortic valve regurgitation is not  visualized. No aortic stenosis is present.   7. Aortic dilatation noted. There is mild dilatation of the aortic root,  measuring 37 mm.   8. The inferior vena cava is dilated in size with <50% respiratory variability, suggesting right atrial pressure of 15 mmHg.   Patient Profile     71 y.o. male  with chronic systolic congestive heart failure, nonischemic cardiomyopathy, pulmonary hypertension (mild), hypertension, tobacco use, and prior history of alcohol abuse with chronic pancreatitis s/p biliary stent who presents to the ER for worsening dyspnea found to have acute on chronic combined systolic and diastolic heart failure exacerbation.  Assessment & Plan    Acute on chronic combined heart failure with underlying nonischemic cardiomyopathy-EF less than 20% on echo with severe right ventricular dysfunction and pulmonary hypertension.we will transition to p.o Lasix today..  Strict input and output, please continue daily weights.  His blood pressure has been low normal-his blood pressure is still on the lower end.  His midodrine has been restarted.  Once blood pressure can tolerate we will resume his guideline directed medical therapy: Low-dose beta-blocker 3.125 mg carvedilol twice daily, Aldactone as well as Entresto 24-26 mg twice a day.  Respiratory failure-appears to be dependent on oxygen inpatient.  We will perform testing today to determine the need for home oxygen.    Tobacco use-smoking cessation advised.  Chest tightness-he had negative troponin previous catheterization with normal coronaries.  Doubt ACS.  Hypertension-low normal blood pressure.  Guideline medical therapy has been held due to low normal blood pressure.    Chronic kidney disease stage III-creatinine 1.55 today.  Baseline appears to be 1.0-1.2.   If his creatinine increases tomorrow we will transition him off the p.o. diuretic.  Chronic alcoholic pancreatitis-abdominal pain in the setting of his chronic pancreatitis due to alcohol use.  Recent NSVT-keep mag above 2 and potassium above 4.  Continue home gabapentin for his chronic pain.   For questions or updates, please contact [place] Please consult [email or web address] for contact info under Cardiology/STEMI.      Rolly Pancake, DO  08/02/2021, 8:39 AM

## 2021-08-03 DIAGNOSIS — I5023 Acute on chronic systolic (congestive) heart failure: Secondary | ICD-10-CM | POA: Diagnosis not present

## 2021-08-03 LAB — MAGNESIUM: Magnesium: 2.1 mg/dL (ref 1.7–2.4)

## 2021-08-03 LAB — BASIC METABOLIC PANEL
Anion gap: 9 (ref 5–15)
BUN: 24 mg/dL — ABNORMAL HIGH (ref 8–23)
CO2: 29 mmol/L (ref 22–32)
Calcium: 9.2 mg/dL (ref 8.9–10.3)
Chloride: 98 mmol/L (ref 98–111)
Creatinine, Ser: 1.42 mg/dL — ABNORMAL HIGH (ref 0.61–1.24)
GFR, Estimated: 53 mL/min — ABNORMAL LOW (ref >60)
Glucose, Bld: 150 mg/dL — ABNORMAL HIGH (ref 70–99)
Potassium: 4 mmol/L (ref 3.5–5.1)
Sodium: 136 mmol/L (ref 135–145)

## 2021-08-03 LAB — BRAIN NATRIURETIC PEPTIDE: B Natriuretic Peptide: 1334.1 pg/mL — ABNORMAL HIGH (ref 0.0–100.0)

## 2021-08-03 NOTE — Progress Notes (Signed)
   08/02/21 2207  Provider Notification  Provider Name/Title Dr. Alfred Levins  Date Provider Notified 08/03/21  Time Provider Notified 0205  Method of Notification Page  Notification Reason Red med refusal   RN sent text page to MD on call to notify of patient's red med refusal. Medication refused by patient is lovenox '40mg'$  subcutaneous injection.

## 2021-08-03 NOTE — Plan of Care (Signed)

## 2021-08-03 NOTE — Progress Notes (Signed)
Mobility Specialist Progress Note    08/03/21 1141  Mobility  Activity Ambulated with assistance in hallway  Level of Assistance Standby assist, set-up cues, supervision of patient - no hands on  Assistive Device Other (Comment) (forearm crutch)  Distance Ambulated (ft) 320 ft  Activity Response Tolerated well  $Mobility charge 1 Mobility   Pre-Mobility: 71 HR, 101/71 BP, 95% SpO2 Post-Mobility: 82 HR, 98% SpO2  Pt received in bed and agreeable. No complaints on walk. Returned to bed with call bell in reach. Encouraged pt sit up in chair.     Hildred Alamin Mobility Specialist  Primary: 5N M.S. Phone: [phone] Secondary: 6N M.S. Phone: [phone]

## 2021-08-03 NOTE — Progress Notes (Signed)
Progress Note  Patient Name: Ryan [name] Date of Encounter: 08/03/2021  Primary Cardiologist: Carlyle Dolly, MD   Subjective   Patient seen examined his bedside.  He is lying in bed when.   Overall 24 hours output -920 mL.  Inpatient Medications    Scheduled Meds:  enoxaparin (LOVENOX) injection  40 mg Subcutaneous Q24H   ferrous sulfate  325 mg Oral BID WC   furosemide  40 mg Intravenous BID   gabapentin  600 mg Oral BID   ivabradine  5 mg Oral BID WC   magnesium oxide  400 mg Oral Daily   mouth rinse  15 mL Mouth Rinse BID   midodrine  5 mg Oral TID WC   pantoprazole  40 mg Oral Daily   rosuvastatin  20 mg Oral Daily   Continuous Infusions:  PRN Meds: acetaminophen, albuterol, gabapentin, nitroGLYCERIN, ondansetron (ZOFRAN) IV, oxyCODONE-acetaminophen, pneumococcal 20-valent conjugate vaccine   Vital Signs    Vitals:   08/03/21 0300 08/03/21 0321 08/03/21 0433 08/03/21 0643  BP:  108/77    Pulse: 68 80 74 70  Resp: '17 19 16 16  '$ Temp:  97.7 F (36.5 C)    TempSrc:  Oral    SpO2: 97% 95% 100% 97%  Weight:   77.1 kg   Height:        Intake/Output Summary (Last 24 hours) at 08/03/2021 0739 Last data filed at 08/03/2021 0400 Gross per 24 hour  Intake 730 ml  Output 1650 ml  Net -920 ml   Filed Weights   08/01/21 0500 08/02/21 0357 08/03/21 0433  Weight: 76.6 kg 76 kg 77.1 kg    Telemetry    Sinus rhythm- Personally Reviewed  ECG    None today- Personally Reviewed  Physical Exam     General: Comfortable, lying in the bed, with nasal cannula Head: Atraumatic, normal size  Eyes: PEERLA, EOMI  Neck: Supple, normal JVD Cardiac: Normal S1, S2; RRR; no murmurs, rubs, or gallops Lungs: Clear to auscultation bilaterally Abd: Soft, nontender, no hepatomegaly  Ext: warm, no edema Musculoskeletal: No deformities, BUE and BLE strength normal and equal Skin: Warm and dry, no rashes   Neuro: Alert and oriented to person, place, time, and situation,  CNII-XII grossly intact, no focal deficits  Psych: Normal mood and affect   Labs    Chemistry Recent Labs  Lab 07/29/21 0052 07/30/21 0041 07/31/21 0119 08/01/21 0046 08/02/21 0917  NA 138   < > 141 139 137  K 3.6   < > 3.8 4.0 4.0  CL 110   < > 107 105 100  CO2 20*   < > '26 25 29  '$ GLUCOSE 109*   < > 116* 114* 222*  BUN 15   < > '20 20 22  '$ CREATININE 1.36*   < > 1.42* 1.55* 1.35*  CALCIUM 8.8*   < > 8.8* 9.0 9.0  PROT 6.4*  --   --   --   --   ALBUMIN 3.1*  --   --   --   --   AST 29  --   --   --   --   ALT 21  --   --   --   --   ALKPHOS 137*  --   --   --   --   BILITOT 0.6  --   --   --   --   GFRNONAA 56*   < > 53* 48* 56*  ANIONGAP  8   < > '8 9 8   '$ < > = values in this interval not displayed.     Hematology Recent Labs  Lab 07/28/21 1230 07/28/21 2231 07/29/21 0052  WBC 6.4 6.7 5.8  RBC 4.58 4.46 4.34  HGB 13.0 12.9* 12.3*  HCT 42.0 39.1 38.6*  MCV 91.7 87.7 88.9  MCH 28.4 28.9 28.3  MCHC 31.0 33.0 31.9  RDW 17.2* 17.1* 16.8*  PLT 228 211 207    Cardiac EnzymesNo results for input(s): TROPONINI in the last 168 hours. No results for input(s): TROPIPOC in the last 168 hours.   BNP Recent Labs  Lab 07/28/21 1230  BNP 2,637.5*     DDimer No results for input(s): DDIMER in the last 168 hours.   Radiology    No results found.  Cardiac Studies   Echo 07/29/21:  1. Left ventricular ejection fraction, by estimation, is <20%. The left  ventricle has severely decreased function. The left ventricle demonstrates  global hypokinesis. The left ventricular internal cavity size was  moderately dilated. Left ventricular  diastolic parameters are consistent with Grade III diastolic dysfunction  (restrictive).   2. Right ventricular systolic function is severely reduced. The right  ventricular size is mildly enlarged. There is moderately elevated  pulmonary artery systolic pressure. The estimated right ventricular  systolic pressure is 36.4 mmHg.   3. Left  atrial size was moderately dilated.   4. Right atrial size was moderately dilated.   5. The mitral valve is normal in structure. Moderate mitral valve  regurgitation. No evidence of mitral stenosis.   6. The aortic valve is tricuspid. Aortic valve regurgitation is not  visualized. No aortic stenosis is present.   7. Aortic dilatation noted. There is mild dilatation of the aortic root,  measuring 37 mm.   8. The inferior vena cava is dilated in size with <50% respiratory variability, suggesting right atrial pressure of 15 mmHg.   Patient Profile     71 y.o. male  with chronic systolic congestive heart failure, nonischemic cardiomyopathy, pulmonary hypertension (mild), hypertension, tobacco use, and prior history of alcohol abuse with chronic pancreatitis s/p biliary stent who presents to the ER for worsening dyspnea found to have acute on chronic combined systolic and diastolic heart failure exacerbation.  Assessment & Plan    Acute on chronic combined heart failure with underlying nonischemic cardiomyopathy-EF less than 20% on echo with severe right ventricular dysfunction and pulmonary hypertension.did not transition the patient yesterday to p.o. Lasix due to some JVD on clinical exam.  Still on Lasix 40 mg twice daily we will continue this for the next 24 hours.  His output slightly increased compared to yesterday.  This is good.  Shortness of breath is improving he tells me.   His blood pressure has been low normal-his blood pressure is still on the lower end.  His blood pressure has improved since starting his midodrine.  I am going to start his guideline directed medication: I will start a low-dose beta-blocker instead of his usual carvedilol we will start metoprolol succinate 12.5 mg daily.  Hopefully we can add his Aldactone and Entresto during his hospitalization.   Respiratory failure-he has been titrated off nasal cannula he is doing well.  May not need home oxygen.  Tobacco  use-smoking cessation advised.  Chest tightness-he had negative troponin previous catheterization with normal coronaries.  Doubt ACS.  Hypertension-low normal blood pressure.  Guideline medical therapy has been held due to low normal blood pressure.  Chronic kidney disease stage III-creatinine trended down 1.35.  Baseline appears to be 1.0-1.2.  We will get BMP and mag.  Chronic alcoholic pancreatitis-abdominal pain in the setting of his chronic pancreatitis due to alcohol use.  Recent NSVT-keep mag above 2 and potassium above 4.  Continue home gabapentin for his chronic pain.  Full code On Lovenox for DVT prophylaxis.   For questions or updates, please contact [place] Please consult [email or web address] for contact info under Cardiology/STEMI.      Signed, Berniece Salines, DO  08/03/2021, 7:39 AM

## 2021-08-04 DIAGNOSIS — I5023 Acute on chronic systolic (congestive) heart failure: Secondary | ICD-10-CM | POA: Diagnosis not present

## 2021-08-04 DIAGNOSIS — I272 Pulmonary hypertension, unspecified: Secondary | ICD-10-CM

## 2021-08-04 LAB — BASIC METABOLIC PANEL
Anion gap: 9 (ref 5–15)
BUN: 23 mg/dL (ref 8–23)
CO2: 28 mmol/L (ref 22–32)
Calcium: 9.3 mg/dL (ref 8.9–10.3)
Chloride: 101 mmol/L (ref 98–111)
Creatinine, Ser: 1.36 mg/dL — ABNORMAL HIGH (ref 0.61–1.24)
GFR, Estimated: 56 mL/min — ABNORMAL LOW (ref >60)
Glucose, Bld: 156 mg/dL — ABNORMAL HIGH (ref 70–99)
Potassium: 3.8 mmol/L (ref 3.5–5.1)
Sodium: 138 mmol/L (ref 135–145)

## 2021-08-04 LAB — MAGNESIUM: Magnesium: 2.1 mg/dL (ref 1.7–2.4)

## 2021-08-04 MED ORDER — IVABRADINE HCL 5 MG PO TABS: 5.0000 mg | ORAL_TABLET | Freq: Two times a day (BID) | ORAL | 3 refills | Status: DC

## 2021-08-04 MED ORDER — FUROSEMIDE 40 MG PO TABS: 40.0000 mg | ORAL_TABLET | Freq: Two times a day (BID) | ORAL | Status: DC

## 2021-08-04 MED ORDER — METOPROLOL SUCCINATE ER 25 MG PO TB24: 12.5000 mg | ORAL_TABLET | Freq: Every day | ORAL | 3 refills | Status: DC

## 2021-08-04 MED ORDER — METOPROLOL SUCCINATE ER 25 MG PO TB24
12.5000 mg | ORAL_TABLET | Freq: Every day | ORAL | Status: DC
  Administered 2021-08-04: 12.5 mg via ORAL
  Filled 2021-08-04: qty 1

## 2021-08-04 MED ORDER — FUROSEMIDE 40 MG PO TABS
40.0000 mg | ORAL_TABLET | Freq: Two times a day (BID) | ORAL | 3 refills | Status: DC
Start: 2021-08-04 — End: 2021-09-01

## 2021-08-04 MED ORDER — POTASSIUM CHLORIDE ER 10 MEQ PO TBCR: 10.0000 meq | EXTENDED_RELEASE_TABLET | Freq: Every day | ORAL | 3 refills | Status: DC

## 2021-08-04 NOTE — Plan of Care (Signed)

## 2021-08-04 NOTE — Progress Notes (Signed)
Discharge instructions given to patient. Patient understands medication changes and follow up appointment with the [facility]. Patient has transport at bedside.

## 2021-08-04 NOTE — Progress Notes (Signed)
Progress Note  Patient Name: Ryan [name] Date of Encounter: 08/04/2021  Primary Cardiologist: Carlyle Dolly, MD   Subjective   Patient seen examined his bedside.  He is lying in bed when.   Overall 24 hours output -714m.  Inpatient Medications    Scheduled Meds:  enoxaparin (LOVENOX) injection  40 mg Subcutaneous Q24H   ferrous sulfate  325 mg Oral BID WC   furosemide  40 mg Intravenous BID   gabapentin  600 mg Oral BID   ivabradine  5 mg Oral BID WC   magnesium oxide  400 mg Oral Daily   mouth rinse  15 mL Mouth Rinse BID   midodrine  5 mg Oral TID WC   pantoprazole  40 mg Oral Daily   rosuvastatin  20 mg Oral Daily   Continuous Infusions:  PRN Meds: acetaminophen, albuterol, gabapentin, nitroGLYCERIN, ondansetron (ZOFRAN) IV, oxyCODONE-acetaminophen, pneumococcal 20-valent conjugate vaccine   Vital Signs    Vitals:   08/03/21 1924 08/03/21 2330 08/04/21 0351 08/04/21 0743  BP: 106/64 107/74 111/72 117/76  Pulse: 74 70 82 80  Resp: '13 14 20 15  '$ Temp: 98.2 F (36.8 C) 98 F (36.7 C) 97.8 F (36.6 C) 97.8 F (36.6 C)  TempSrc: Oral Oral Oral Oral  SpO2: 99% 98% 99% 95%  Weight:   76.2 kg   Height:        Intake/Output Summary (Last 24 hours) at 08/04/2021 0816 Last data filed at 08/04/2021 0800 Gross per 24 hour  Intake 1194 ml  Output 1900 ml  Net -706 ml   Filed Weights   08/02/21 0357 08/03/21 0433 08/04/21 0351  Weight: 76 kg 77.1 kg 76.2 kg    Telemetry    Sinus rhythm- Personally Reviewed  ECG    None today- Personally Reviewed  Physical Exam     General: Comfortable, lying in the bed Head: Atraumatic, normal size  Eyes: PEERLA, EOMI  Neck: Supple, normal JVD Cardiac: Normal S1, S2; RRR; no murmurs, rubs, or gallops Lungs: Clear to auscultation bilaterally Abd: Soft, nontender, no hepatomegaly  Ext: warm, no edema Musculoskeletal: No deformities, BUE and BLE strength normal and equal Skin: Warm and dry, no rashes   Neuro:  Alert and oriented to person, place, time, and situation, CNII-XII grossly intact, no focal deficits  Psych: Normal mood and affect   Labs    Chemistry Recent Labs  Lab 07/29/21 0052 07/30/21 0041 08/01/21 0046 08/02/21 0917 08/03/21 1001  NA 138   < > 139 137 136  K 3.6   < > 4.0 4.0 4.0  CL 110   < > 105 100 98  CO2 20*   < > '25 29 29  '$ GLUCOSE 109*   < > 114* 222* 150*  BUN 15   < > 20 22 24*  CREATININE 1.36*   < > 1.55* 1.35* 1.42*  CALCIUM 8.8*   < > 9.0 9.0 9.2  PROT 6.4*  --   --   --   --   ALBUMIN 3.1*  --   --   --   --   AST 29  --   --   --   --   ALT 21  --   --   --   --   ALKPHOS 137*  --   --   --   --   BILITOT 0.6  --   --   --   --   GFRNONAA 56*   < >  48* 56* 53*  ANIONGAP 8   < > '9 8 9   '$ < > = values in this interval not displayed.     Hematology Recent Labs  Lab 07/28/21 1230 07/28/21 2231 07/29/21 0052  WBC 6.4 6.7 5.8  RBC 4.58 4.46 4.34  HGB 13.0 12.9* 12.3*  HCT 42.0 39.1 38.6*  MCV 91.7 87.7 88.9  MCH 28.4 28.9 28.3  MCHC 31.0 33.0 31.9  RDW 17.2* 17.1* 16.8*  PLT 228 211 207    Cardiac EnzymesNo results for input(s): TROPONINI in the last 168 hours. No results for input(s): TROPIPOC in the last 168 hours.   BNP Recent Labs  Lab 07/28/21 1230 08/03/21 1001  BNP 2,637.5* 1,334.1*     DDimer No results for input(s): DDIMER in the last 168 hours.   Radiology    No results found.  Cardiac Studies   Echo 07/29/21:  1. Left ventricular ejection fraction, by estimation, is <20%. The left  ventricle has severely decreased function. The left ventricle demonstrates  global hypokinesis. The left ventricular internal cavity size was  moderately dilated. Left ventricular  diastolic parameters are consistent with Grade III diastolic dysfunction  (restrictive).   2. Right ventricular systolic function is severely reduced. The right  ventricular size is mildly enlarged. There is moderately elevated  pulmonary artery systolic  pressure. The estimated right ventricular  systolic pressure is 58.5 mmHg.   3. Left atrial size was moderately dilated.   4. Right atrial size was moderately dilated.   5. The mitral valve is normal in structure. Moderate mitral valve  regurgitation. No evidence of mitral stenosis.   6. The aortic valve is tricuspid. Aortic valve regurgitation is not  visualized. No aortic stenosis is present.   7. Aortic dilatation noted. There is mild dilatation of the aortic root,  measuring 37 mm.   8. The inferior vena cava is dilated in size with <50% respiratory variability, suggesting right atrial pressure of 15 mmHg.   Patient Profile     71 y.o. male  with chronic systolic congestive heart failure, nonischemic cardiomyopathy, pulmonary hypertension (mild), hypertension, tobacco use, and prior history of alcohol abuse with chronic pancreatitis s/p biliary stent who presents to the ER for worsening dyspnea found to have acute on chronic combined systolic and diastolic heart failure exacerbation.  Assessment & Plan    Acute on chronic combined heart failure with underlying nonischemic cardiomyopathy-EF less than 20% on echo with severe right ventricular dysfunction and pulmonary hypertension. Transitioning to p.o. Lasix today.    Shortness of breath is improving he tells me.   His blood pressure has been low normal-his blood pressure is still on the lower end.  His blood pressure has improved since starting his midodrine.  I am going to start his guideline directed medication: Started on metoprolol succinate 12.5 mg daily.  we can add his Aldactone and Entresto in the outpatient setting.   Respiratory failure-he has been titrated off nasal cannula he is doing well.  May not need home oxygen.  Tobacco use-smoking cessation advised.  Chest tightness-he had negative troponin previous catheterization with normal coronaries.  Doubt ACS.  Hypertension-low normal blood pressure.  Guideline medical  therapy has been held due to low normal blood pressure.    Chronic kidney disease stage III-creatinine trended down 1.42 yesterday baseline appears to be 1.0-1.2.   Chronic alcoholic pancreatitis-abdominal pain in the setting of his chronic pancreatitis due to alcohol use.  Recent NSVT-keep mag above 2 and potassium  above 4.  Continue home gabapentin for his chronic pain.  Full code On Lovenox for DVT prophylaxis.  Plan for discharge to home today.  For questions or updates, please contact [place] Please consult [email or web address] for contact info under Cardiology/STEMI.      Signed, Jaxson Anglin, DO  08/04/2021, 8:16 AM

## 2021-08-04 NOTE — Discharge Summary (Signed)
Discharge Summary    Patient ID: Ryan [name] MRN: [id number]; DOB: 1950-12-16  Admit date: 07/28/2021 Discharge date: 08/04/2021  PCP:  [facility] [place] Providers Cardiologist:  Carlyle Dolly, MD  Advanced Heart Failure:  Glori Bickers, MD  {     Discharge Diagnoses    Principal Problem:   Acute on chronic combined systolic and diastolic CHF (congestive heart failure) ([place]) Active Problems:   Essential hypertension   Tobacco use disorder   CKD (chronic kidney disease), stage II   COPD (chronic obstructive pulmonary disease) ([place])   AKI (acute kidney injury) ([place])   Nonischemic cardiomyopathy ([place])   Acute respiratory failure with hypoxia (HCC)   Abdominal pain   NSVT (nonsustained ventricular tachycardia) ([place])   Acute on chronic systolic CHF (congestive heart failure) ([place])   Pulmonary hypertension, unspecified ([place])    Diagnostic Studies/Procedures    Echo 07/29/21: 1. Left ventricular ejection fraction, by estimation, is <20%. The left  ventricle has severely decreased function. The left ventricle demonstrates  global hypokinesis. The left ventricular internal cavity size was  moderately dilated. Left ventricular  diastolic parameters are consistent with Grade III diastolic dysfunction  (restrictive).   2. Right ventricular systolic function is severely reduced. The right  ventricular size is mildly enlarged. There is moderately elevated  pulmonary artery systolic pressure. The estimated right ventricular  systolic pressure is 44.3 mmHg.   3. Left atrial size was moderately dilated.   4. Right atrial size was moderately dilated.   5. The mitral valve is normal in structure. Moderate mitral valve  regurgitation. No evidence of mitral stenosis.   6. The aortic valve is tricuspid. Aortic valve regurgitation is not  visualized. No aortic stenosis is present.   7. Aortic dilatation noted. There is mild dilatation of the aortic root,   measuring 37 mm.   8. The inferior vena cava is dilated in size with <50% respiratory  variability, suggesting right atrial pressure of 15 mmHg. _____________   History of Present Illness     Ryan [name] is a 71 y.o. male with chronic systolic congestive heart failure, nonischemic cardiomyopathy, pulmonary hypertension (mild), hypertension, tobacco use, CKD stage III, and prior history of alcohol abuse with chronic pancreatitis s/p biliary stent who is being seen 07/28/2021 for the evaluation of shortness of breath/heart failure.   Hx of HFrEF (EF 15-20% in 2012, 35-40% in 04/2013, cath in 11/23018 showing normal cors and mild pulmonary HTN, EF 15-20% in 04/2018, 11/2020 and 01/2021).   Ryan [name] was last examined by Ryan [name] in 04/2021 following a recent hospitalization at the [facility] for recurrent pancreatitis and required biliary stenting during admission. He did report having lost over 65 pounds and was on a liquid diet. His weight was at 160 lbs and given hypotension, Coreg was reduced to 3.125 mg twice daily and Entresto was reduced to 24-26 mg twice daily. He was continued on Corlanor 5 mg twice daily and Lasix 40 mg twice daily with plans to add Spironolactone or an SGLT2 inhibitor eventually.   Admitted 06/2021 at [facility] at heart failure exacerbation.  Had excellent diuresis.  Has some orthostasis as well he was discharged on  Aldactone 12.5 mg daily, otherwise continue Lasix 20 mg daily, Corlanor 5 mg twice daily, and midodrine 5 mg 3 times a day.  Coreg and Entresto were stopped during admission due to orthostasis.  Ryan [name] was doing well after discharge up until 3 days ago  when started to having shortness of breath.  Reports compliance with medications and low-sodium diet.  He reported shortness of breath with activity and laying down.  Had orthopnea and PND.  Reported upper sternal chest tightness.  He had appointment in heart and vascular center this afternoon but due  to worsening breathing he came to ER this morning.  BNP elevated at 2637.  Chest x-ray with cardiomegaly and pleural effusion.  High-sensitivity troponin negative x2.  He was given Lasix 40 mg x 1 with approximately 2 L of output.  Breathing improved minimally.  Lactic acid normal.  Cardiology is asked for further evaluation.  Hospital Course     Consultants: none  Nonischemic cardiomyopathy Acute on chronic biventricular heart failure Pulmonary hypertension Low-normal blood pressure - hypertension Respiratory failure - resolved Echo this admission with continued LVEF < 20% with grade III diastolic dysfunction, severely reduced RV function, and RV systolic pressure 16.1 mmHg. He was diuresed with IV lasix and transitioned to PO lasix today. Weight down to  168 lbs from 172 lbs on admission. Dyspnea has improved. GDMT has bee complicated by marginal pressure - improved by midodrine. Started toprol 12.5 mg daily. Plan to add spironolactone and enresto in the outpatient setting as pressure allows. He has been weaned off of [place], will not require home O2 at this time.  Will collect BMP at follow up in AHF clinic. Will discharge on 10 mEq K.    CKD stage III sCr at discharge 1.42, K 4.0   Chest tightness Negative CE and prior heart cath with normal coronaries.  Suspect may be related to volume.   Chronic alcoholic pancreatitis Abdominal pain improved.   Recent NSVT Mg > 2.0, K > 4.0 BB as above   Chronic pain Continue home gabapentin   Pt seen and examined by Dr. Harriet Masson and deemed stable for discharge. Follow up has been arranged.   Did the patient have an acute coronary syndrome (MI, NSTEMI, STEMI, etc) this admission?:  No                               Did the patient have a percutaneous coronary intervention (stent / angioplasty)?:  No.        The patient will be scheduled for a TOC follow up appointment in 7-14 days.  A message has been sent to the [facility] and Scheduling  Pool at the office where the patient should be seen for follow up.  _____________  Discharge Vitals Blood pressure 117/76, pulse 80, temperature 97.8 F (36.6 C), temperature source Oral, resp. rate 15, height '5\' 11"'$  (1.803 m), weight 76.2 kg, SpO2 95 %.  Filed Weights   08/02/21 0357 08/03/21 0433 08/04/21 0351  Weight: 76 kg 77.1 kg 76.2 kg    Labs & Radiologic Studies    CBC No results for input(s): WBC, NEUTROABS, HGB, HCT, MCV, PLT in the last 72 hours. Basic Metabolic Panel Recent Labs    08/03/21 1001 08/04/21 0925  NA 136 138  K 4.0 3.8  CL 98 101  CO2 29 28  GLUCOSE 150* 156*  BUN 24* 23  CREATININE 1.42* 1.36*  CALCIUM 9.2 9.3  MG 2.1 2.1   Liver Function Tests No results for input(s): AST, ALT, ALKPHOS, BILITOT, PROT, ALBUMIN in the last 72 hours. No results for input(s): LIPASE, AMYLASE in the last 72 hours. High Sensitivity Troponin:   Recent Labs  Lab 07/28/21 1230  07/28/21 1418  TROPONINIHS 13 14    BNP Invalid input(s): POCBNP D-Dimer No results for input(s): DDIMER in the last 72 hours. Hemoglobin A1C No results for input(s): HGBA1C in the last 72 hours. Fasting Lipid Panel No results for input(s): CHOL, HDL, LDLCALC, TRIG, CHOLHDL, LDLDIRECT in the last 72 hours. Thyroid Function Tests No results for input(s): TSH, T4TOTAL, T3FREE, THYROIDAB in the last 72 hours.  Invalid input(s): FREET3 _____________  DG Chest 2 View  Result Date: 07/28/2021 CLINICAL DATA:  Shortness of breath. EXAM: CHEST - 2 VIEW COMPARISON:  Radiographs 06/27/2021.  CT 06/28/2021. FINDINGS: The heart is enlarged. Partially loculated right pleural effusion has increased in volume. There is a small left pleural effusion. Underlying mild scarring or atelectasis at both lung bases without edema or confluent airspace opacity. Old rib fractures noted on the right. No acute osseous findings are seen. IMPRESSION: Cardiomegaly with increased right greater than left pleural  effusions. No edema or new airspace disease. Electronically Signed   By: Richardean Sale M.D.   On: 07/28/2021 12:49   ECHOCARDIOGRAM COMPLETE  Result Date: 07/29/2021    ECHOCARDIOGRAM REPORT   Patient Name:   TYRONNE BLANN Date of Exam: 07/29/2021 Medical Rec #:  [id number]       Height:       71.0 in Accession #:    [id number]      Weight:       171.1 lb Date of Birth:  12/30/1950       BSA:          1.973 m Patient Age:    66 years        BP:           97/67 mmHg Patient Gender: M               HR:           97 bpm. Exam Location:  Inpatient Procedure: 2D Echo, Cardiac Doppler and Color Doppler Indications:    CHF  History:        Patient has prior history of Echocardiogram examinations, most                 recent 11/29/2020. CHF, COPD; Risk Factors:Hypertension.  Sonographer:    Joette Catching RCS Referring Phys: [phone] [place]  1. Left ventricular ejection fraction, by estimation, is <20%. The left ventricle has severely decreased function. The left ventricle demonstrates global hypokinesis. The left ventricular internal cavity size was moderately dilated. Left ventricular diastolic parameters are consistent with Grade III diastolic dysfunction (restrictive).  2. Right ventricular systolic function is severely reduced. The right ventricular size is mildly enlarged. There is moderately elevated pulmonary artery systolic pressure. The estimated right ventricular systolic pressure is 95.6 mmHg.  3. Left atrial size was moderately dilated.  4. Right atrial size was moderately dilated.  5. The mitral valve is normal in structure. Moderate mitral valve regurgitation. No evidence of mitral stenosis.  6. The aortic valve is tricuspid. Aortic valve regurgitation is not visualized. No aortic stenosis is present.  7. Aortic dilatation noted. There is mild dilatation of the aortic root, measuring 37 mm.  8. The inferior vena cava is dilated in size with <50% respiratory variability,  suggesting right atrial pressure of 15 mmHg. FINDINGS  Left Ventricle: Left ventricular ejection fraction, by estimation, is <20%. The left ventricle has severely decreased function. The left ventricle demonstrates global hypokinesis. The left ventricular internal cavity size was moderately dilated.  There is no left ventricular hypertrophy. Left ventricular diastolic parameters are consistent with Grade III diastolic dysfunction (restrictive). Right Ventricle: The right ventricular size is mildly enlarged. No increase in right ventricular wall thickness. Right ventricular systolic function is severely reduced. There is moderately elevated pulmonary artery systolic pressure. The tricuspid regurgitant velocity is 2.76 m/s, and with an assumed right atrial pressure of 15 mmHg, the estimated right ventricular systolic pressure is 32.9 mmHg. Left Atrium: Left atrial size was moderately dilated. Right Atrium: Right atrial size was moderately dilated. Pericardium: There is no evidence of pericardial effusion. Mitral Valve: The mitral valve is normal in structure. Moderate mitral valve regurgitation. No evidence of mitral valve stenosis. Tricuspid Valve: The tricuspid valve is normal in structure. Tricuspid valve regurgitation is mild. Aortic Valve: The aortic valve is tricuspid. Aortic valve regurgitation is not visualized. No aortic stenosis is present. Aortic valve mean gradient measures 1.0 mmHg. Aortic valve peak gradient measures 2.3 mmHg. Aortic valve area, by VTI measures 3.07 cm. Pulmonic Valve: The pulmonic valve was normal in structure. Pulmonic valve regurgitation is trivial. Aorta: Aortic dilatation noted. There is mild dilatation of the aortic root, measuring 37 mm. Venous: The inferior vena cava is dilated in size with less than 50% respiratory variability, suggesting right atrial pressure of 15 mmHg. IAS/Shunts: No atrial level shunt detected by color flow Doppler.  LEFT VENTRICLE PLAX 2D LVIDd:          6.60 cm      Diastology LVIDs:         6.45 cm      LV e' lateral:   6.53 cm/s LV PW:         0.85 cm      LV E/e' lateral: 11.4 LV IVS:        0.80 cm LVOT diam:     2.10 cm LV SV:         33 LV SV Index:   17 LVOT Area:     3.46 cm  LV Volumes (MOD) LV vol d, MOD A2C: 238.0 ml LV vol d, MOD A4C: 263.0 ml LV vol s, MOD A2C: 209.0 ml LV vol s, MOD A4C: 253.0 ml LV SV MOD A2C:     29.0 ml LV SV MOD A4C:     263.0 ml LV SV MOD BP:      22.2 ml RIGHT VENTRICLE            IVC RV Basal diam:  4.50 cm    IVC diam: 2.40 cm RV Mid diam:    2.80 cm RV S prime:     7.40 cm/s TAPSE (M-mode): 1.2 cm LEFT ATRIUM              Index        RIGHT ATRIUM           Index LA diam:        4.80 cm  2.43 cm/m   RA Area:     29.10 cm LA Vol (A2C):   107.0 ml 54.23 ml/m  RA Volume:   103.00 ml 52.20 ml/m LA Vol (A4C):   76.7 ml  38.87 ml/m LA Biplane Vol: 90.1 ml  45.66 ml/m  AORTIC VALVE                    PULMONIC VALVE AV Area (Vmax):    3.39 cm     PV Vmax:          0.63 m/s  AV Area (Vmean):   2.99 cm     PV Peak grad:     1.6 mmHg AV Area (VTI):     3.07 cm     PR End Diast Vel: 6.86 msec AV Vmax:           75.40 cm/s AV Vmean:          58.100 cm/s AV VTI:            0.107 m AV Peak Grad:      2.3 mmHg AV Mean Grad:      1.0 mmHg LVOT Vmax:         73.80 cm/s LVOT Vmean:        50.200 cm/s LVOT VTI:          0.095 m LVOT/AV VTI ratio: 0.89  AORTA Ao Root diam: 3.70 cm Ao Asc diam:  3.20 cm MITRAL VALVE                  TRICUSPID VALVE MV Area (PHT): 8.25 cm       TR Peak grad:   30.5 mmHg MV Decel Time: 92 msec        TR Vmax:        276.00 cm/s MR Peak grad:    54.8 mmHg MR Mean grad:    42.0 mmHg    SHUNTS MR Vmax:         370.00 cm/s  Systemic VTI:  0.09 m MR Vmean:        316.0 cm/s   Systemic Diam: 2.10 cm MR PISA:         1.57 cm MR PISA Eff ROA: 16 mm MR PISA Radius:  0.50 cm MV E velocity: 74.60 cm/s Ryan McleanMD Electronically signed by Franki Monte Signature Date/Time: 07/29/2021/5:59:37 PM    Final      Disposition   Pt is being discharged home today in good condition.  Follow-up Plans & Appointments     Follow-up Information     [facility]. Go on 08/22/2021.   Specialty: Cardiology Why: 9:30 AM, parking code 1004 Contact information: [address] [id number] [place] [phone]               Discharge Instructions     Diet - low sodium heart healthy   Complete by: As directed    Increase activity slowly   Complete by: As directed        Discharge Medications   Allergies as of 08/04/2021       Reactions   Tape Other (See Comments)   "TAPE TEARS OFF MY SKIN"        Medication List     STOP taking these medications    carvedilol 3.125 MG tablet Commonly known as: COREG   digoxin 0.125 MG tablet Commonly known as: LANOXIN   Entresto 24-26 MG Generic drug: sacubitril-valsartan   spironolactone 25 MG tablet Commonly known as: ALDACTONE       TAKE these medications    albuterol 108 (90 Base) MCG/ACT inhaler Commonly known as: VENTOLIN HFA Inhale 2 puffs into the lungs every 6 (six) hours as needed for wheezing or shortness of breath.   albuterol (2.5 MG/3ML) 0.083% nebulizer solution Commonly known as: PROVENTIL Take 2.5 mg by nebulization every 6 (six) hours as needed for wheezing or shortness of breath.   DOCUSATE SODIUM PO Take 2 capsules by mouth daily as needed for constipation.  dorzolamide-timolol 22.3-6.8 MG/ML ophthalmic solution Commonly known as: COSOPT Place 1 drop into both eyes 2 (two) times daily.   ferrous sulfate 325 (65 FE) MG tablet Take 325 mg by mouth 2 (two) times daily with a meal.   furosemide 40 MG tablet Commonly known as: LASIX Take 1 tablet (40 mg total) by mouth 2 (two) times daily. What changed:  medication strength how much to take when to take this   gabapentin 300 MG capsule Commonly known as: NEURONTIN Take 600  mg by mouth See admin instructions. Take two capsules (600 mg) by mouth twice daily, may take a 3rd dose (600 mg) midday as needed for pain   ivabradine 5 MG Tabs tablet Commonly known as: CORLANOR Take 1 tablet (5 mg total) by mouth 2 (two) times daily with a meal.   latanoprost 0.005 % ophthalmic solution Commonly known as: XALATAN Place 1 drop into both eyes at bedtime.   lipase/protease/amylase 12000-38000 units Cpep capsule Commonly known as: CREON Take 12,000 Units by mouth 3 (three) times daily with meals. Take with a 24000 unit capsule   Creon 24000-76000 units Cpep Generic drug: Pancrelipase (Lip-Prot-Amyl) Take 24,000 Units by mouth 3 (three) times daily with meals. Take with a 12000 unit capsule   loratadine 10 MG tablet Commonly known as: CLARITIN Take 10 mg by mouth at bedtime.   metoprolol succinate 25 MG 24 hr tablet Commonly known as: TOPROL-XL Take 0.5 tablets (12.5 mg total) by mouth daily. Start taking on: Aug 05, 2021   midodrine 5 MG tablet Commonly known as: PROAMATINE Take 1 tablet (5 mg total) by mouth 3 (three) times daily with meals.   Netarsudil Dimesylate 0.02 % Soln Place 1 drop into both eyes at bedtime.   pantoprazole 40 MG tablet Commonly known as: PROTONIX Take 1 tablet (40 mg total) by mouth daily. What changed: when to take this   potassium chloride 10 MEQ tablet Commonly known as: KLOR-CON Take 1 tablet (10 mEq total) by mouth daily. What changed: when to take this   rosuvastatin 20 MG tablet Commonly known as: CRESTOR Take 20 mg by mouth daily.   traMADol 50 MG tablet Commonly known as: ULTRAM Take 100 mg by mouth 2 (two) times daily as needed (pain).           Outstanding Labs/Studies   BMP - adjust potassium  Add back spiro and entersto  Duration of Discharge Encounter   Greater than 30 minutes including physician time.  Signed, Tami Lin Cleland Simkins, PA 08/04/2021, 10:54 AM

## 2021-08-19 ENCOUNTER — Telehealth (HOSPITAL_COMMUNITY): Payer: Self-pay

## 2021-08-19 NOTE — Telephone Encounter (Signed)
Called to confirm/remind patient of their appointment at the [facility] on 08/22/21.

## 2021-08-22 ENCOUNTER — Encounter (HOSPITAL_COMMUNITY): Payer: Self-pay

## 2021-08-22 ENCOUNTER — Ambulatory Visit (HOSPITAL_BASED_OUTPATIENT_CLINIC_OR_DEPARTMENT_OTHER)
Admit: 2021-08-22 | Discharge: 2021-08-22 | Disposition: A | Discharge status: Home / Self Care | Payer: Medicare Other | Attending: Family Medicine | Admitting: Family Medicine

## 2021-08-22 VITALS — BP 126/76 | HR 102 | Wt 177.8 lb

## 2021-08-22 DIAGNOSIS — K219 Gastro-esophageal reflux disease without esophagitis: Secondary | ICD-10-CM | POA: Diagnosis not present

## 2021-08-22 DIAGNOSIS — N183 Chronic kidney disease, stage 3 unspecified: Secondary | ICD-10-CM | POA: Insufficient documentation

## 2021-08-22 DIAGNOSIS — F17210 Nicotine dependence, cigarettes, uncomplicated: Secondary | ICD-10-CM | POA: Insufficient documentation

## 2021-08-22 DIAGNOSIS — Z91048 Other nonmedicinal substance allergy status: Secondary | ICD-10-CM | POA: Diagnosis not present

## 2021-08-22 DIAGNOSIS — G8929 Other chronic pain: Secondary | ICD-10-CM | POA: Diagnosis not present

## 2021-08-22 DIAGNOSIS — N179 Acute kidney failure, unspecified: Secondary | ICD-10-CM | POA: Diagnosis not present

## 2021-08-22 DIAGNOSIS — I130 Hypertensive heart and chronic kidney disease with heart failure and stage 1 through stage 4 chronic kidney disease, or unspecified chronic kidney disease: Secondary | ICD-10-CM | POA: Insufficient documentation

## 2021-08-22 DIAGNOSIS — H409 Unspecified glaucoma: Secondary | ICD-10-CM | POA: Diagnosis not present

## 2021-08-22 DIAGNOSIS — K861 Other chronic pancreatitis: Secondary | ICD-10-CM | POA: Diagnosis not present

## 2021-08-22 DIAGNOSIS — F410 Panic disorder [episodic paroxysmal anxiety] without agoraphobia: Secondary | ICD-10-CM | POA: Diagnosis present

## 2021-08-22 DIAGNOSIS — Z9689 Presence of other specified functional implants: Secondary | ICD-10-CM | POA: Insufficient documentation

## 2021-08-22 DIAGNOSIS — J410 Simple chronic bronchitis: Secondary | ICD-10-CM | POA: Diagnosis not present

## 2021-08-22 DIAGNOSIS — M7989 Other specified soft tissue disorders: Secondary | ICD-10-CM | POA: Diagnosis not present

## 2021-08-22 DIAGNOSIS — D631 Anemia in chronic kidney disease: Secondary | ICD-10-CM | POA: Diagnosis not present

## 2021-08-22 DIAGNOSIS — N1831 Chronic kidney disease, stage 3a: Secondary | ICD-10-CM | POA: Diagnosis not present

## 2021-08-22 DIAGNOSIS — J449 Chronic obstructive pulmonary disease, unspecified: Secondary | ICD-10-CM | POA: Diagnosis not present

## 2021-08-22 DIAGNOSIS — Z79899 Other long term (current) drug therapy: Secondary | ICD-10-CM | POA: Insufficient documentation

## 2021-08-22 DIAGNOSIS — R0602 Shortness of breath: Secondary | ICD-10-CM | POA: Diagnosis not present

## 2021-08-22 DIAGNOSIS — M545 Low back pain, unspecified: Secondary | ICD-10-CM | POA: Diagnosis not present

## 2021-08-22 DIAGNOSIS — E785 Hyperlipidemia, unspecified: Secondary | ICD-10-CM | POA: Diagnosis not present

## 2021-08-22 DIAGNOSIS — I10 Essential (primary) hypertension: Secondary | ICD-10-CM

## 2021-08-22 DIAGNOSIS — D649 Anemia, unspecified: Secondary | ICD-10-CM | POA: Diagnosis not present

## 2021-08-22 DIAGNOSIS — F1011 Alcohol abuse, in remission: Secondary | ICD-10-CM

## 2021-08-22 DIAGNOSIS — I5022 Chronic systolic (congestive) heart failure: Secondary | ICD-10-CM

## 2021-08-22 DIAGNOSIS — I509 Heart failure, unspecified: Secondary | ICD-10-CM | POA: Diagnosis not present

## 2021-08-22 DIAGNOSIS — I5023 Acute on chronic systolic (congestive) heart failure: Secondary | ICD-10-CM

## 2021-08-22 DIAGNOSIS — K860 Alcohol-induced chronic pancreatitis: Secondary | ICD-10-CM | POA: Insufficient documentation

## 2021-08-22 DIAGNOSIS — I5043 Acute on chronic combined systolic (congestive) and diastolic (congestive) heart failure: Secondary | ICD-10-CM | POA: Diagnosis not present

## 2021-08-22 DIAGNOSIS — E876 Hypokalemia: Secondary | ICD-10-CM | POA: Diagnosis not present

## 2021-08-22 DIAGNOSIS — J9811 Atelectasis: Secondary | ICD-10-CM | POA: Diagnosis not present

## 2021-08-22 DIAGNOSIS — I951 Orthostatic hypotension: Secondary | ICD-10-CM | POA: Diagnosis not present

## 2021-08-22 DIAGNOSIS — Z823 Family history of stroke: Secondary | ICD-10-CM | POA: Diagnosis not present

## 2021-08-22 DIAGNOSIS — E611 Iron deficiency: Secondary | ICD-10-CM | POA: Diagnosis not present

## 2021-08-22 DIAGNOSIS — E869 Volume depletion, unspecified: Secondary | ICD-10-CM | POA: Diagnosis not present

## 2021-08-22 DIAGNOSIS — I110 Hypertensive heart disease with heart failure: Secondary | ICD-10-CM | POA: Diagnosis not present

## 2021-08-22 DIAGNOSIS — F17200 Nicotine dependence, unspecified, uncomplicated: Secondary | ICD-10-CM | POA: Diagnosis not present

## 2021-08-22 DIAGNOSIS — R42 Dizziness and giddiness: Secondary | ICD-10-CM | POA: Diagnosis not present

## 2021-08-22 DIAGNOSIS — I428 Other cardiomyopathies: Secondary | ICD-10-CM | POA: Diagnosis not present

## 2021-08-22 DIAGNOSIS — Z8249 Family history of ischemic heart disease and other diseases of the circulatory system: Secondary | ICD-10-CM | POA: Diagnosis not present

## 2021-08-22 DIAGNOSIS — Z833 Family history of diabetes mellitus: Secondary | ICD-10-CM | POA: Diagnosis not present

## 2021-08-22 LAB — BASIC METABOLIC PANEL
Anion gap: 7 (ref 5–15)
BUN: 15 mg/dL (ref 8–23)
CO2: 24 mmol/L (ref 22–32)
Calcium: 9.1 mg/dL (ref 8.9–10.3)
Chloride: 106 mmol/L (ref 98–111)
Creatinine, Ser: 1.08 mg/dL (ref 0.61–1.24)
GFR, Estimated: >60 mL/min (ref >60)
Glucose, Bld: 104 mg/dL — ABNORMAL HIGH (ref 70–99)
Potassium: 3.8 mmol/L (ref 3.5–5.1)
Sodium: 137 mmol/L (ref 135–145)

## 2021-08-22 LAB — CBC
HCT: 39.3 % (ref 39.0–52.0)
Hemoglobin: 12.5 g/dL — ABNORMAL LOW (ref 13.0–17.0)
MCH: 28.4 pg (ref 26.0–34.0)
MCHC: 31.8 g/dL (ref 30.0–36.0)
MCV: 89.3 fL (ref 80.0–100.0)
Platelets: 270 10*3/uL (ref 150–400)
RBC: 4.4 MIL/uL (ref 4.22–5.81)
RDW: 17.6 % — ABNORMAL HIGH (ref 11.5–15.5)
WBC: 7.3 10*3/uL (ref 4.0–10.5)
nRBC: 0 % (ref 0.0–0.2)

## 2021-08-22 LAB — BRAIN NATRIURETIC PEPTIDE: B Natriuretic Peptide: 1892.6 pg/mL — ABNORMAL HIGH (ref 0.0–100.0)

## 2021-08-22 NOTE — Patient Instructions (Signed)
Thank you for coming in today  Labs were done today, if any labs are abnormal the clinic will call you No news is good news  INCREASE Lasix to 80 mg twice daily for 3 days with Potassium 20 meq for 3 days with Lasix dosage. After those 3 days go back to regular medication regime.  TAKE Metolazone 2.5 mg with Potassium 40 meq take with Lasix dose. Total dose of Potassium 60 meq today 08/22/2021  Your physician recommends that you return for lab work in: 1 week  Your physician recommends that you schedule a follow-up appointment in: 2 weeks in clinic  At the [facility], you and your health needs are our priority. As part of our continuing mission to provide you with exceptional heart care, we have created designated Provider Care Teams. These Care Teams include your primary Cardiologist (physician) and Advanced Practice Providers (APPs- Physician Assistants and Nurse Practitioners) who all work together to provide you with the care you need, when you need it.   You may see any of the following providers on your designated Care Team at your next follow up: Dr Glori Bickers Dr Haynes Kerns, NP Lyda Jester, [facility] [place], [place] Audry Riles, PharmD   Please be sure to bring in all your medications bottles to every appointment.   If you have any questions or concerns before your next appointment please send [place] a message through [place] or call our office at [phone].    TO LEAVE A MESSAGE FOR THE NURSE SELECT OPTION 2, PLEASE LEAVE A MESSAGE INCLUDING: YOUR NAME DATE OF BIRTH CALL BACK NUMBER REASON FOR CALL**this is important as we prioritize the call backs  YOU WILL RECEIVE A CALL BACK THE SAME DAY AS LONG AS YOU CALL BEFORE 4:00 PM
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
## 2021-08-22 NOTE — Progress Notes (Signed)
ReDS Vest / Clip - 08/22/21 0900       ReDS Vest / Clip   Station Marker C    Ruler Value 28.5    ReDS Value Range High volume overload    ReDS Actual Value 43

## 2021-08-25 ENCOUNTER — Emergency Department (HOSPITAL_COMMUNITY): Payer: Medicare Other

## 2021-08-25 ENCOUNTER — Telehealth (HOSPITAL_COMMUNITY): Payer: Self-pay | Admitting: *Deleted

## 2021-08-25 ENCOUNTER — Other Ambulatory Visit: Payer: Self-pay

## 2021-08-25 ENCOUNTER — Encounter (HOSPITAL_COMMUNITY): Payer: Self-pay

## 2021-08-25 ENCOUNTER — Inpatient Hospital Stay (HOSPITAL_COMMUNITY)
Admission: EM | Admit: 2021-08-25 | Discharge: 2021-09-01 | DRG: 291 | Disposition: A | Discharge status: Home / Self Care | Payer: Medicare Other | Attending: Family Medicine | Admitting: Family Medicine

## 2021-08-25 DIAGNOSIS — F17210 Nicotine dependence, cigarettes, uncomplicated: Secondary | ICD-10-CM | POA: Diagnosis present

## 2021-08-25 DIAGNOSIS — Z833 Family history of diabetes mellitus: Secondary | ICD-10-CM | POA: Diagnosis not present

## 2021-08-25 DIAGNOSIS — T502X5A Adverse effect of carbonic-anhydrase inhibitors, benzothiadiazides and other diuretics, initial encounter: Secondary | ICD-10-CM | POA: Diagnosis not present

## 2021-08-25 DIAGNOSIS — Z79899 Other long term (current) drug therapy: Secondary | ICD-10-CM

## 2021-08-25 DIAGNOSIS — I509 Heart failure, unspecified: Principal | ICD-10-CM

## 2021-08-25 DIAGNOSIS — I428 Other cardiomyopathies: Secondary | ICD-10-CM | POA: Diagnosis not present

## 2021-08-25 DIAGNOSIS — N1831 Chronic kidney disease, stage 3a: Secondary | ICD-10-CM | POA: Diagnosis present

## 2021-08-25 DIAGNOSIS — E611 Iron deficiency: Secondary | ICD-10-CM | POA: Diagnosis present

## 2021-08-25 DIAGNOSIS — D631 Anemia in chronic kidney disease: Secondary | ICD-10-CM | POA: Diagnosis not present

## 2021-08-25 DIAGNOSIS — G8929 Other chronic pain: Secondary | ICD-10-CM | POA: Diagnosis not present

## 2021-08-25 DIAGNOSIS — Z91048 Other nonmedicinal substance allergy status: Secondary | ICD-10-CM

## 2021-08-25 DIAGNOSIS — K860 Alcohol-induced chronic pancreatitis: Secondary | ICD-10-CM | POA: Diagnosis present

## 2021-08-25 DIAGNOSIS — F410 Panic disorder [episodic paroxysmal anxiety] without agoraphobia: Secondary | ICD-10-CM | POA: Diagnosis present

## 2021-08-25 DIAGNOSIS — I5043 Acute on chronic combined systolic (congestive) and diastolic (congestive) heart failure: Secondary | ICD-10-CM | POA: Diagnosis not present

## 2021-08-25 DIAGNOSIS — N179 Acute kidney failure, unspecified: Secondary | ICD-10-CM | POA: Diagnosis not present

## 2021-08-25 DIAGNOSIS — J9811 Atelectasis: Secondary | ICD-10-CM | POA: Diagnosis present

## 2021-08-25 DIAGNOSIS — Z91199 Patient's noncompliance with other medical treatment and regimen due to unspecified reason: Secondary | ICD-10-CM

## 2021-08-25 DIAGNOSIS — J449 Chronic obstructive pulmonary disease, unspecified: Secondary | ICD-10-CM | POA: Diagnosis not present

## 2021-08-25 DIAGNOSIS — J410 Simple chronic bronchitis: Secondary | ICD-10-CM | POA: Diagnosis not present

## 2021-08-25 DIAGNOSIS — E785 Hyperlipidemia, unspecified: Secondary | ICD-10-CM | POA: Diagnosis present

## 2021-08-25 DIAGNOSIS — I130 Hypertensive heart and chronic kidney disease with heart failure and stage 1 through stage 4 chronic kidney disease, or unspecified chronic kidney disease: Principal | ICD-10-CM | POA: Diagnosis present

## 2021-08-25 DIAGNOSIS — Z823 Family history of stroke: Secondary | ICD-10-CM

## 2021-08-25 DIAGNOSIS — M545 Low back pain, unspecified: Secondary | ICD-10-CM | POA: Diagnosis present

## 2021-08-25 DIAGNOSIS — Z8249 Family history of ischemic heart disease and other diseases of the circulatory system: Secondary | ICD-10-CM | POA: Diagnosis not present

## 2021-08-25 DIAGNOSIS — D649 Anemia, unspecified: Secondary | ICD-10-CM | POA: Diagnosis not present

## 2021-08-25 DIAGNOSIS — I10 Essential (primary) hypertension: Secondary | ICD-10-CM | POA: Diagnosis not present

## 2021-08-25 DIAGNOSIS — I951 Orthostatic hypotension: Secondary | ICD-10-CM | POA: Diagnosis not present

## 2021-08-25 DIAGNOSIS — K219 Gastro-esophageal reflux disease without esophagitis: Secondary | ICD-10-CM | POA: Diagnosis present

## 2021-08-25 DIAGNOSIS — E869 Volume depletion, unspecified: Secondary | ICD-10-CM | POA: Diagnosis not present

## 2021-08-25 DIAGNOSIS — E876 Hypokalemia: Secondary | ICD-10-CM | POA: Diagnosis not present

## 2021-08-25 DIAGNOSIS — H409 Unspecified glaucoma: Secondary | ICD-10-CM | POA: Diagnosis not present

## 2021-08-25 LAB — CBC WITH DIFFERENTIAL/PLATELET
Abs Immature Granulocytes: 0.03 10*3/uL (ref 0.00–0.07)
Basophils Absolute: 0 10*3/uL (ref 0.0–0.1)
Basophils Relative: 1 %
Eosinophils Absolute: 0.3 10*3/uL (ref 0.0–0.5)
Eosinophils Relative: 4 %
HCT: 38.9 % — ABNORMAL LOW (ref 39.0–52.0)
Hemoglobin: 12.8 g/dL — ABNORMAL LOW (ref 13.0–17.0)
Immature Granulocytes: 0 %
Lymphocytes Relative: 18 %
Lymphs Abs: 1.2 10*3/uL (ref 0.7–4.0)
MCH: 28.8 pg (ref 26.0–34.0)
MCHC: 32.9 g/dL (ref 30.0–36.0)
MCV: 87.4 fL (ref 80.0–100.0)
Monocytes Absolute: 0.5 10*3/uL (ref 0.1–1.0)
Monocytes Relative: 7 %
Neutro Abs: 4.9 10*3/uL (ref 1.7–7.7)
Neutrophils Relative %: 70 %
Platelets: 239 10*3/uL (ref 150–400)
RBC: 4.45 MIL/uL (ref 4.22–5.81)
RDW: 17.7 % — ABNORMAL HIGH (ref 11.5–15.5)
WBC: 7 10*3/uL (ref 4.0–10.5)
nRBC: 0 % (ref 0.0–0.2)

## 2021-08-25 LAB — COMPREHENSIVE METABOLIC PANEL
ALT: 23 U/L (ref 0–44)
AST: 29 U/L (ref 15–41)
Albumin: 3.5 g/dL (ref 3.5–5.0)
Alkaline Phosphatase: 125 U/L (ref 38–126)
Anion gap: 11 (ref 5–15)
BUN: 15 mg/dL (ref 8–23)
CO2: 22 mmol/L (ref 22–32)
Calcium: 8.9 mg/dL (ref 8.9–10.3)
Chloride: 104 mmol/L (ref 98–111)
Creatinine, Ser: 1.1 mg/dL (ref 0.61–1.24)
GFR, Estimated: >60 mL/min (ref >60)
Glucose, Bld: 108 mg/dL — ABNORMAL HIGH (ref 70–99)
Potassium: 3.7 mmol/L (ref 3.5–5.1)
Sodium: 137 mmol/L (ref 135–145)
Total Bilirubin: 0.9 mg/dL (ref 0.3–1.2)
Total Protein: 6.9 g/dL (ref 6.5–8.1)

## 2021-08-25 LAB — TROPONIN I (HIGH SENSITIVITY)
Troponin I (High Sensitivity): 19 ng/L — ABNORMAL HIGH (ref <18)
Troponin I (High Sensitivity): 18 ng/L — ABNORMAL HIGH (ref <18)

## 2021-08-25 LAB — BRAIN NATRIURETIC PEPTIDE: B Natriuretic Peptide: 1922.3 pg/mL — ABNORMAL HIGH (ref 0.0–100.0)

## 2021-08-25 MED ORDER — IVABRADINE HCL 5 MG PO TABS
5.0000 mg | ORAL_TABLET | Freq: Two times a day (BID) | ORAL | Status: DC
  Administered 2021-08-26 – 2021-09-01 (×13): 5 mg via ORAL
  Filled 2021-08-25 (×14): qty 1

## 2021-08-25 MED ORDER — ROSUVASTATIN CALCIUM 5 MG PO TABS
20.0000 mg | ORAL_TABLET | Freq: Every day | ORAL | Status: DC
  Administered 2021-08-26 – 2021-09-01 (×7): 20 mg via ORAL
  Filled 2021-08-25 (×8): qty 4

## 2021-08-25 MED ORDER — TRAMADOL HCL 50 MG PO TABS
50.0000 mg | ORAL_TABLET | Freq: Three times a day (TID) | ORAL | Status: DC | PRN
  Administered 2021-08-26 – 2021-08-31 (×8): 50 mg via ORAL
  Filled 2021-08-25 (×9): qty 1

## 2021-08-25 MED ORDER — PANTOPRAZOLE SODIUM 40 MG PO TBEC
40.0000 mg | DELAYED_RELEASE_TABLET | Freq: Every day | ORAL | Status: DC
  Administered 2021-08-25 – 2021-08-31 (×7): 40 mg via ORAL
  Filled 2021-08-25 (×7): qty 1

## 2021-08-25 MED ORDER — ACETAMINOPHEN 325 MG PO TABS
650.0000 mg | ORAL_TABLET | Freq: Four times a day (QID) | ORAL | Status: DC | PRN
  Administered 2021-08-26 – 2021-08-27 (×3): 650 mg via ORAL
  Filled 2021-08-25 (×3): qty 2

## 2021-08-25 MED ORDER — FUROSEMIDE 10 MG/ML IJ SOLN
80.0000 mg | Freq: Once | INTRAMUSCULAR | Status: AC
  Administered 2021-08-25: 80 mg via INTRAVENOUS
  Filled 2021-08-25: qty 8

## 2021-08-25 MED ORDER — ALBUTEROL SULFATE (2.5 MG/3ML) 0.083% IN NEBU: 3.0000 mL | INHALATION_SOLUTION | Freq: Four times a day (QID) | RESPIRATORY_TRACT | Status: DC | PRN

## 2021-08-25 MED ORDER — METOPROLOL SUCCINATE ER 25 MG PO TB24
12.5000 mg | ORAL_TABLET | Freq: Every day | ORAL | Status: DC
  Administered 2021-08-26 – 2021-08-28 (×2): 12.5 mg via ORAL
  Filled 2021-08-25 (×3): qty 1

## 2021-08-25 MED ORDER — ENOXAPARIN SODIUM 40 MG/0.4ML IJ SOSY
40.0000 mg | PREFILLED_SYRINGE | INTRAMUSCULAR | Status: DC
  Filled 2021-08-25 (×3): qty 0.4

## 2021-08-25 MED ORDER — ACETAMINOPHEN 650 MG RE SUPP: 650.0000 mg | Freq: Four times a day (QID) | RECTAL | Status: DC | PRN

## 2021-08-25 MED ORDER — MIDODRINE HCL 5 MG PO TABS
5.0000 mg | ORAL_TABLET | Freq: Three times a day (TID) | ORAL | Status: DC
  Administered 2021-08-26 – 2021-08-29 (×10): 5 mg via ORAL
  Filled 2021-08-25 (×10): qty 1

## 2021-08-25 MED ORDER — NETARSUDIL DIMESYLATE 0.02 % OP SOLN: 1.0000 [drp] | Freq: Every day | OPHTHALMIC | Status: DC

## 2021-08-25 MED ORDER — GABAPENTIN 300 MG PO CAPS: 300.0000 mg | ORAL_CAPSULE | Freq: Every day | ORAL | Status: DC | PRN

## 2021-08-25 MED ORDER — POLYETHYLENE GLYCOL 3350 17 G PO PACK
17.0000 g | PACK | Freq: Every day | ORAL | Status: DC | PRN
Start: 2021-08-25 — End: 2021-09-01

## 2021-08-25 MED ORDER — FERROUS SULFATE 325 (65 FE) MG PO TABS
325.0000 mg | ORAL_TABLET | ORAL | Status: DC
  Administered 2021-08-26 – 2021-09-01 (×4): 325 mg via ORAL
  Filled 2021-08-25 (×4): qty 1

## 2021-08-25 MED ORDER — LATANOPROST 0.005 % OP SOLN
1.0000 [drp] | Freq: Every day | OPHTHALMIC | Status: DC
  Administered 2021-08-26 – 2021-08-30 (×5): 1 [drp] via OPHTHALMIC
  Filled 2021-08-25: qty 2.5

## 2021-08-25 MED ORDER — FUROSEMIDE 10 MG/ML IJ SOLN
80.0000 mg | Freq: Two times a day (BID) | INTRAMUSCULAR | Status: DC
  Administered 2021-08-26 (×2): 80 mg via INTRAVENOUS
  Filled 2021-08-25 (×3): qty 8

## 2021-08-25 MED ORDER — SODIUM CHLORIDE 0.9% FLUSH
3.0000 mL | Freq: Two times a day (BID) | INTRAVENOUS | Status: DC
Start: 2021-08-25 — End: 2021-09-01
  Administered 2021-08-25 – 2021-09-01 (×14): 3 mL via INTRAVENOUS

## 2021-08-25 MED ORDER — DORZOLAMIDE HCL-TIMOLOL MAL 2-0.5 % OP SOLN
1.0000 [drp] | Freq: Two times a day (BID) | OPHTHALMIC | Status: DC
  Administered 2021-08-26 – 2021-08-30 (×9): 1 [drp] via OPHTHALMIC
  Filled 2021-08-25: qty 10

## 2021-08-25 MED ORDER — PANCRELIPASE (LIP-PROT-AMYL) 12000-38000 UNITS PO CPEP
36000.0000 [IU] | ORAL_CAPSULE | Freq: Three times a day (TID) | ORAL | Status: DC
  Administered 2021-08-26 – 2021-09-01 (×18): 36000 [IU] via ORAL
  Filled 2021-08-25 (×2): qty 3
  Filled 2021-08-25: qty 1
  Filled 2021-08-25: qty 3
  Filled 2021-08-25: qty 1
  Filled 2021-08-25 (×15): qty 3

## 2021-08-25 MED ORDER — GABAPENTIN 300 MG PO CAPS
600.0000 mg | ORAL_CAPSULE | Freq: Two times a day (BID) | ORAL | Status: DC
  Administered 2021-08-25 – 2021-09-01 (×14): 600 mg via ORAL
  Filled 2021-08-25 (×14): qty 2

## 2021-08-25 MED ORDER — GABAPENTIN 300 MG PO CAPS: 600.0000 mg | ORAL_CAPSULE | ORAL | Status: DC
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
## 2021-08-25 NOTE — ED Triage Notes (Signed)
Hx of heart failure and complaining of sob, leg swelling and dizziness. Sent by Cards for evaluation

## 2021-08-25 NOTE — Telephone Encounter (Signed)
Spoke with patient and pts wife. Both think he needs to be admitted. Pts wife said he will come to [facility]. Pt is very short of breath.

## 2021-08-25 NOTE — Telephone Encounter (Signed)
Pt called stating he is more short of breath and his foot and knee are swollen.  Pt said since med changes (see below) he is urinating more he just doesn't feel any better. Pts weight is 177lbs the same as it was during his office visit on 6/12.   Med changes per 6/12 AVS INCREASE Lasix to 80 mg twice daily for 3 days with Potassium 20 meq for 3 days with Lasix dosage. After those 3 days go back to regular medication regime.   TAKE Metolazone 2.5 mg with Potassium 40 meq take with Lasix dose. Total dose of Potassium 60 meq today 08/22/2021  Routed to Amy Clegg,NP

## 2021-08-25 NOTE — ED Provider Notes (Signed)
[facility] EMERGENCY DEPARTMENT Provider Note   CSN: [id number] Arrival date & time: 08/25/21  1201     History  Chief Complaint  Patient presents with   Shortness of Breath    Ryan [name] is a 71 y.o. male.  Patient is a 71 year old male who presents with shortness of breath.  He has a history of advanced CHF and is followed by Dr. Haroldine Laws.  He also has a history of hypertension, chronic kidney disease and COPD.  He was recently admitted to the hospital from May 18 to May 25 based on chart review.  This was for CHF exacerbation.  He says over the last 3 days he has had worsening shortness of breath.  He has a hard time laying down to sleep at night.  He has had some increased swelling in his legs, mostly in his left leg.  He says he always has more swelling in his left leg than his right leg so this is baseline for him.  He was seen at the cardiologist office on June 12 and was told to increase his Lasix from 40 mg twice a day to 80 mg twice a day.  He only did this 1 day.  He says his weight is the same as it has been.  He has not had any improvement in symptoms.  His breathing has been getting worse.  He is also uses inhalers at home without improvement in symptoms.  He had an episode of chest pain which was in the center of his chest earlier today it lasted about 3 to 4 minutes and was at rest.  He has not had any further episodes.  No other chest pain.  No cough or cold symptoms.  No fevers.       Home Medications Prior to Admission medications   Medication Sig Start Date End Date Taking? Authorizing Provider  albuterol (PROVENTIL) (2.5 MG/3ML) 0.083% nebulizer solution Take 2.5 mg by nebulization every 6 (six) hours as needed for wheezing or shortness of breath.    [provider]  albuterol (VENTOLIN HFA) 108 (90 Base) MCG/ACT inhaler Inhale 2 puffs into the lungs every 6 (six) hours as needed for wheezing or shortness of breath.    [provider]  DOCUSATE SODIUM PO Take 2 capsules by mouth daily as needed for constipation.    [provider]  dorzolamide-timolol (COSOPT) 22.3-6.8 MG/ML ophthalmic solution Place 1 drop into both eyes 2 (two) times daily.    [provider]  ferrous sulfate 325 (65 FE) MG tablet Take 325 mg by mouth 2 (two) times daily with a meal.    [provider]  furosemide (LASIX) 40 MG tablet Take 1 tablet (40 mg total) by mouth 2 (two) times daily. 08/04/21   Duke, Tami Lin, PA  gabapentin (NEURONTIN) 300 MG capsule Take 600 mg by mouth See admin instructions. Take two capsules (600 mg) by mouth twice daily, may take a 3rd dose (600 mg) midday as needed for pain    [provider]  ivabradine (CORLANOR) 5 MG TABS tablet Take 1 tablet (5 mg total) by mouth 2 (two) times daily with a meal. 08/04/21   Duke, Tami Lin, PA  latanoprost (XALATAN) 0.005 % ophthalmic solution Place 1 drop into both eyes at bedtime.    [provider]  lipase/protease/amylase (CREON) 12000-38000 units CPEP capsule Take 12,000 Units by mouth 3 (three) times daily with meals. Take with a 24000 unit capsule  [provider]  loratadine (CLARITIN) 10 MG tablet Take 10 mg by mouth at bedtime.    [provider]  metoprolol succinate (TOPROL-XL) 25 MG 24 hr tablet Take 0.5 tablets (12.5 mg total) by mouth daily. 08/05/21   Duke, Tami Lin, PA  midodrine (PROAMATINE) 5 MG tablet Take 1 tablet (5 mg total) by mouth 3 (three) times daily with meals. 07/06/21   Dwyane Dee, MD  Netarsudil Dimesylate 0.02 % SOLN Place 1 drop into both eyes at bedtime.    [provider]  Pancrelipase, Lip-Prot-Amyl, (CREON) 24000-76000 units CPEP Take 24,000 Units by mouth 3 (three) times daily with meals. Take with a 12000 unit capsule    [provider]  pantoprazole (PROTONIX) 40 MG tablet Take 1 tablet (40 mg total) by mouth daily. Patient taking differently:  Take 40 mg by mouth at bedtime. 10/08/19   Nuala Alpha A, PA-C  potassium chloride (KLOR-CON) 10 MEQ tablet Take 1 tablet (10 mEq total) by mouth daily. 08/04/21   Duke, Tami Lin, PA  rosuvastatin (CRESTOR) 20 MG tablet Take 20 mg by mouth daily.    [provider]  traMADol (ULTRAM) 50 MG tablet Take 100 mg by mouth 2 (two) times daily as needed (pain).    [provider]      Allergies    Tape    Review of Systems   Review of Systems  Constitutional:  Positive for fatigue. Negative for chills, diaphoresis and fever.  HENT:  Negative for congestion, rhinorrhea and sneezing.   Eyes: Negative.   Respiratory:  Positive for shortness of breath. Negative for cough and chest tightness.   Cardiovascular:  Positive for chest pain and leg swelling.  Gastrointestinal:  Negative for abdominal pain, blood in stool, diarrhea, nausea and vomiting.  Genitourinary:  Negative for difficulty urinating, flank pain, frequency and hematuria.  Musculoskeletal:  Negative for arthralgias and back pain.  Skin:  Negative for rash.  Neurological:  Negative for dizziness, speech difficulty, weakness, numbness and headaches.    Physical Exam Updated Vital Signs BP 109/82   Pulse 95   Temp 98.5 F (36.9 C) (Oral)   Resp 17   Ht '5\' 11"'$  (1.803 m)   Wt 80.3 kg   SpO2 98%   BMI 24.69 kg/m  Physical Exam Constitutional:      Appearance: He is well-developed.  HENT:     Head: Normocephalic and atraumatic.  Eyes:     Pupils: Pupils are equal, round, and reactive to light.  Cardiovascular:     Rate and Rhythm: Normal rate and regular rhythm.     Heart sounds: Normal heart sounds.  Pulmonary:     Effort: Pulmonary effort is normal. No respiratory distress.     Breath sounds: Normal breath sounds. No wheezing or rales.     Comments: Diminished breath sounds in the bases Chest:     Chest wall: No tenderness.  Abdominal:     General: Bowel sounds are normal.     Palpations:  Abdomen is soft.     Tenderness: There is no abdominal tenderness. There is no guarding or rebound.  Musculoskeletal:        General: Normal range of motion.     Cervical back: Normal range of motion and neck supple.     Comments: 1+ pitting edema in the left lower extremity, trace edema in the right lower extremity, no calf tenderness  Lymphadenopathy:     Cervical: No cervical adenopathy.  Skin:  General: Skin is warm and dry.     Findings: No rash.  Neurological:     Mental Status: He is alert and oriented to person, place, and time.     ED Results / Procedures / Treatments   Labs (all labs ordered are listed, but only abnormal results are displayed) Labs Reviewed  CBC WITH DIFFERENTIAL/PLATELET - Abnormal; Notable for the following components:      Result Value   Hemoglobin 12.8 (*)    HCT 38.9 (*)    RDW 17.7 (*)    All other components within normal limits  COMPREHENSIVE METABOLIC PANEL - Abnormal; Notable for the following components:   Glucose, Bld 108 (*)    All other components within normal limits  BRAIN NATRIURETIC PEPTIDE - Abnormal; Notable for the following components:   B Natriuretic Peptide 1,922.3 (*)    All other components within normal limits  TROPONIN I (HIGH SENSITIVITY) - Abnormal; Notable for the following components:   Troponin I (High Sensitivity) 19 (*)    All other components within normal limits  TROPONIN I (HIGH SENSITIVITY) - Abnormal; Notable for the following components:   Troponin I (High Sensitivity) 18 (*)    All other components within normal limits  BASIC METABOLIC PANEL  MAGNESIUM  CBC    EKG None  Radiology DG Chest 1 View  Result Date: 08/25/2021 CLINICAL DATA:  Provided history: Shortness of breath, leg swelling, dizziness. History of heart failure. EXAM: CHEST  1 VIEW COMPARISON:  Prior chest radiographs 07/28/2021 and earlier. FINDINGS: The patient's chin partially obscures the medial lung apices, bilaterally.  Cardiomegaly. Aortic atherosclerosis. Mild ill-defined opacity right lung base, favored to reflect atelectasis. No evidence of pleural effusion or pneumothorax. No acute bony abnormality identified. IMPRESSION: The patient's chin partially obscures the medial lung apices, bilaterally. Cardiomegaly. Mild ill-defined opacity within the right lung base, favored to reflect atelectasis. Aortic Atherosclerosis (ICD10-I70.0). Electronically Signed   By: Kellie Simmering D.O.   On: 08/25/2021 13:34    Procedures Procedures    Medications Ordered in ED Medications  furosemide (LASIX) injection 80 mg (has no administration in time range)  ivabradine (CORLANOR) tablet 5 mg (has no administration in time range)  metoprolol succinate (TOPROL-XL) 24 hr tablet 12.5 mg (has no administration in time range)  midodrine (PROAMATINE) tablet 5 mg (has no administration in time range)  rosuvastatin (CRESTOR) tablet 20 mg (has no administration in time range)  lipase/protease/amylase (CREON) capsule 36,000 Units (has no administration in time range)  pantoprazole (PROTONIX) EC tablet 40 mg (has no administration in time range)  ferrous sulfate tablet 325 mg (has no administration in time range)  gabapentin (NEURONTIN) capsule 600 mg (has no administration in time range)  albuterol (PROVENTIL) (2.5 MG/3ML) 0.083% nebulizer solution 3 mL (has no administration in time range)  dorzolamide-timolol (COSOPT) 22.3-6.8 MG/ML ophthalmic solution 1 drop (has no administration in time range)  latanoprost (XALATAN) 0.005 % ophthalmic solution 1 drop (has no administration in time range)  Netarsudil Dimesylate 0.02 % SOLN 1 drop (has no administration in time range)  enoxaparin (LOVENOX) injection 40 mg (has no administration in time range)  sodium chloride flush (NS) 0.9 % injection 3 mL (has no administration in time range)  acetaminophen (TYLENOL) tablet 650 mg (has no administration in time range)    Or  acetaminophen  (TYLENOL) suppository 650 mg (has no administration in time range)  polyethylene glycol (MIRALAX / GLYCOLAX) packet 17 g (has no administration in time range)  gabapentin (  NEURONTIN) capsule 600 mg (has no administration in time range)  gabapentin (NEURONTIN) capsule 300 mg (has no administration in time range)  furosemide (LASIX) injection 80 mg (80 mg Intravenous Given 08/25/21 1841)    ED Course/ Medical Decision Making/ A&P                           Medical Decision Making Risk Prescription drug management. Decision regarding hospitalization.   Patient is a 71 year old male who presents with worsening leg swelling and shortness of breath with suggestions of fluid overload.  Chest x-ray was performed which shows no evidence of pneumonia.  No pneumothorax.  No overt pulmonary edema.  He does have cardiomegaly.  This was interpreted by me and confirmed by the radiologist.  He does feel short of breath and is a little bit tachypneic at times but is maintaining normal oxygen saturations.  His BNP is markedly elevated.  His troponins are mildly elevated but flat.  His EKG does not show any ischemic changes.  He was given dose of IV Lasix.  I spoke with the cardiologist on-call, Dr. Margaretann Loveless, who request a hospitalist admission and she will be happy to consult on the patient.  I spoke with Dr. Trilby Drummer who will admit the patient for further treatment.  Final Clinical Impression(s) / ED Diagnoses Final diagnoses:  Acute on chronic congestive heart failure, unspecified heart failure type [facility])    Rx / DC Orders ED Discharge Orders     None         Malvin Johns, MD 08/25/21 2044
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
## 2021-08-25 NOTE — ED Provider Triage Note (Signed)
Emergency Medicine Provider Triage Evaluation Note  Ryan [name] , a 71 y.o. male  was evaluated in triage.  Pt complains of shortness of breath and cough.  Dr. Danae Orleans office advised pt to come in  Review of Systems  Positive:  Negative:   Physical Exam  BP 104/76 (BP Location: Right Arm)   Pulse (!) 102   Temp 98.2 F (36.8 C) (Oral)   Resp (!) 26   Ht '5\' 11"'$  (1.803 m)   Wt 80.3 kg   SpO2 100%   BMI 24.69 kg/m  Gen:   Awake, no distress   Resp:  Normal effort  MSK:   Moves extremities without difficulty  Other:    Medical Decision Making  Medically screening exam initiated at 12:17 PM.  Appropriate orders placed.  MANDRELL VANGILDER was informed that the remainder of the evaluation will be completed by another provider, this initial triage assessment does not replace that evaluation, and the importance of remaining in the ED until their evaluation is complete.     Fransico Meadow, [place] 08/25/21 1218

## 2021-08-26 DIAGNOSIS — E876 Hypokalemia: Secondary | ICD-10-CM | POA: Diagnosis not present

## 2021-08-26 DIAGNOSIS — I10 Essential (primary) hypertension: Secondary | ICD-10-CM | POA: Diagnosis not present

## 2021-08-26 DIAGNOSIS — I5043 Acute on chronic combined systolic (congestive) and diastolic (congestive) heart failure: Secondary | ICD-10-CM | POA: Diagnosis not present

## 2021-08-26 DIAGNOSIS — H409 Unspecified glaucoma: Secondary | ICD-10-CM | POA: Diagnosis not present

## 2021-08-26 DIAGNOSIS — N179 Acute kidney failure, unspecified: Secondary | ICD-10-CM | POA: Diagnosis not present

## 2021-08-26 DIAGNOSIS — J410 Simple chronic bronchitis: Secondary | ICD-10-CM | POA: Diagnosis not present

## 2021-08-26 LAB — CBC
HCT: 43.1 % (ref 39.0–52.0)
Hemoglobin: 13.7 g/dL (ref 13.0–17.0)
MCH: 28.1 pg (ref 26.0–34.0)
MCHC: 31.8 g/dL (ref 30.0–36.0)
MCV: 88.5 fL (ref 80.0–100.0)
Platelets: 255 10*3/uL (ref 150–400)
RBC: 4.87 MIL/uL (ref 4.22–5.81)
RDW: 18 % — ABNORMAL HIGH (ref 11.5–15.5)
WBC: 6.7 10*3/uL (ref 4.0–10.5)
nRBC: 0 % (ref 0.0–0.2)

## 2021-08-26 LAB — BASIC METABOLIC PANEL
Anion gap: 11 (ref 5–15)
BUN: 13 mg/dL (ref 8–23)
CO2: 25 mmol/L (ref 22–32)
Calcium: 8.8 mg/dL — ABNORMAL LOW (ref 8.9–10.3)
Chloride: 103 mmol/L (ref 98–111)
Creatinine, Ser: 1.2 mg/dL (ref 0.61–1.24)
GFR, Estimated: >60 mL/min (ref >60)
Glucose, Bld: 102 mg/dL — ABNORMAL HIGH (ref 70–99)
Potassium: 3.2 mmol/L — ABNORMAL LOW (ref 3.5–5.1)
Sodium: 139 mmol/L (ref 135–145)

## 2021-08-26 LAB — MAGNESIUM: Magnesium: 2.1 mg/dL (ref 1.7–2.4)

## 2021-08-26 MED ORDER — POTASSIUM CHLORIDE CRYS ER 20 MEQ PO TBCR
40.0000 meq | EXTENDED_RELEASE_TABLET | Freq: Two times a day (BID) | ORAL | Status: AC
  Administered 2021-08-26 (×2): 40 meq via ORAL
  Filled 2021-08-26 (×2): qty 2
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
## 2021-08-26 NOTE — Plan of Care (Signed)
  Problem: Activity: Goal: Capacity to carry out activities will improve Outcome: Progressing   Problem: Clinical Measurements: Goal: Respiratory complications will improve Outcome: Progressing   Problem: Pain Managment: Goal: General experience of comfort will improve Outcome: Progressing   Problem: Safety: Goal: Ability to remain free from injury will improve Outcome: Progressing

## 2021-08-26 NOTE — Assessment & Plan Note (Signed)
Hypokalemia.   Renal function with serum cr at 1,29 with K at 5,1 and serum bicarbonate at 20  Hold on K supplementation Continue to hold on furosemide Follow up renal function in am, avoid hypotension and nephrotoxic medications.

## 2021-08-26 NOTE — Assessment & Plan Note (Signed)
-  Chronically on netarsudil dimesylate -medication not available in house; family made aware to bring eyedrops for tx continuation.

## 2021-08-26 NOTE — Plan of Care (Signed)

## 2021-08-26 NOTE — Progress Notes (Signed)
Mobility Specialist Progress Note:   08/26/21 1032  Mobility  Activity Ambulated with assistance in hallway  Level of Assistance Modified independent, requires aide device or extra time  Assistive Device [facility] Ambulated (ft) 200 ft  Activity Response Tolerated well  $Mobility charge 1 Mobility   Pt received in bed willing to participate in mobility. No complaints of pain. Left in bed with call bell in reach and all needs met.   [facility] Ryan [name] Mobility Specialist

## 2021-08-26 NOTE — TOC Initial Note (Addendum)
Transition of Care [facility]) - Initial/Assessment Note    Patient Details  Name: Ryan [name] MRN: [id number] Date of Birth: Jun 02, 1950  [facility]) CM/SW Contact:    Zenon Mayo, RN Phone Number: 08/26/2021, 2:52 PM  Clinical Narrative:                 NCM spoke with patient at bedside, he lives with son.  He states his BIL will transport him home at dc.  He uses a cane. He states he weighs himself daily and checks his bp daily, and eats a low salt and no salt diet.  He goes to [place].  NCM called [place] notification , spoke with Jenell Milliner number is  [phone].   Expected Discharge Plan: Home/Self Care Barriers to Discharge: Continued Medical Work up   Patient Goals and CMS Choice Patient states their goals for this hospitalization and ongoing recovery are:: return home   Choice offered to / list presented to : NA  Expected Discharge Plan and Services Expected Discharge Plan: Home/Self Care In-house Referral: NA Discharge Planning Services: CM Consult Post Acute Care Choice: NA Living arrangements for the past 2 months: Single Family Home                   DME Agency: NA       HH Arranged: NA          Prior Living Arrangements/Services Living arrangements for the past 2 months: Single Family Home Lives with:: Adult Children Patient language and need for interpreter reviewed:: Yes Do you feel safe going back to the place where you live?: Yes      Need for Family Participation in Patient Care: Yes (Comment) Care giver support system in place?: Yes (comment) Current home services: DME (cane) Criminal Activity/Legal Involvement Pertinent to Current Situation/Hospitalization: No - Comment as needed  Activities of Daily Living Home Assistive Devices/Equipment: Cane (specify quad or straight) ADL Screening (condition at time of admission) Patient's cognitive ability adequate to safely complete daily activities?: Yes Is the patient deaf or  have difficulty hearing?: No Does the patient have difficulty seeing, even when wearing glasses/contacts?: No Does the patient have difficulty concentrating, remembering, or making decisions?: No Patient able to express need for assistance with ADLs?: Yes Does the patient have difficulty dressing or bathing?: No Independently performs ADLs?: Yes (appropriate for developmental age) Does the patient have difficulty walking or climbing stairs?: Yes Weakness of Legs: Both Weakness of Arms/Hands: None  Permission Sought/Granted                  Emotional Assessment Appearance:: Appears stated age Attitude/Demeanor/Rapport: Engaged Affect (typically observed): Appropriate Orientation: : Oriented to Self, Oriented to Place, Oriented to  Time, Oriented to Situation Alcohol / Substance Use: Not Applicable Psych Involvement: No (comment)  Admission diagnosis:  Acute on chronic combined systolic (congestive) and diastolic (congestive) heart failure (HCC) [I50.43] Acute on chronic congestive heart failure, unspecified heart failure type ([place]) [I50.9] Patient Active Problem List   Diagnosis Date Noted   Hypokalemia 08/26/2021   Acute on chronic combined systolic (congestive) and diastolic (congestive) heart failure ([place]) 08/25/2021   Pulmonary hypertension, unspecified ([place]) 08/04/2021   NSVT (nonsustained ventricular tachycardia) ([place])    Enteritis 06/28/2021   Abdominal pain 06/28/2021   Constipation    Dilation of biliary tract    Dilation of pancreatic duct    Anemia    Pain    Pancreatic pseudocyst  Alcohol abuse    Chronic pancreatitis ([place]) 10/25/2020   Acute pancreatitis 10/24/2020   Chronic HFrEF (heart failure with reduced ejection fraction) (HCC)    Adenomatous polyp of descending colon    Nonischemic cardiomyopathy ([place])    Alcohol use disorder    COPD (chronic obstructive pulmonary disease) ([place]) 12/19/2013   CKD (chronic kidney disease), stage II 12/03/2013    Post-traumatic osteoarthritis of left hip 11/26/2013   Post-traumatic osteoarthritis of left knee 11/26/2013   Glaucoma 04/23/2013   Low back pain 08/29/2010   Tobacco use disorder 07/08/2010   Essential hypertension 05/16/2010   PCP:  Center, [place]:   [id number] [place]Phone: [phone]: [place]Phone: [phone]: [phone]    Social Determinants of Health (SDOH) Interventions    Readmission Risk Interventions    08/26/2021    2:51 PM 12/03/2020    1:04 PM  Readmission Risk Prevention Plan  Transportation Screening Complete Complete  PCP or Specialist Appt within 3-5 Days Complete   HRI or Home Care Consult Complete Complete  Social Work Consult for [place]/Counseling Complete Complete  Palliative Care Screening Not Applicable Not Applicable  Medication Review (Press photographer Complete Complete

## 2021-08-26 NOTE — Progress Notes (Addendum)
Progress Note   Patient: Ryan [name] DOB: Jun 28, 1950 DOA: 08/25/2021     1 DOS: the patient was seen and examined on 08/26/2021   Brief hospital course: As per H&P written by Dr. Trilby Drummer on 08/25/2021 Ryan [name] is a 71 y.o. male with medical history significant of hypertension, back pain, glaucoma, COPD, CHF, CKD 2, acute and chronic pancreatitis, alcohol use, anemia, orthostatic hypotension presenting with increasing shortness of breath, lower extreme edema, dizziness.  Patient reports for the past several days he has had gradually increasing symptoms including shortness of breath and dizziness and is also noticing increasing edema.  He was seen by heart failure provider on the 12th and had his Lasix dose increased from 40 to 80 mg twice daily.  He took 1 dose of this without improvement but does not appear to have taken more than 1 increased dose.  He has continued to have orthopnea and an episode of chest pain that was nonexertional.  He called his heart failure team today and they recommended ED evaluation.  He denies fevers, chills, abdominal pain, constipation, diarrhea, nausea, vomiting.   ED Course: Vital signs in the ED significant for heart rate in the 90s to 100s and respiratory rate in the teens to 20s.  Lab work-up included CMP with glucose 108.  CBC showed hemoglobin stable at 12.8.  Troponin flat at 19 and then 18 on repeat.  BNP elevated to 1922.  Chest x-ray showing cardiomegaly and right lower lobe opacity favoring atelectasis.  Patient received 80 mg IV Lasix in the ED.  Cardiology was consulted and have placed some orders and will be following along.  Assessment and Plan: * Acute on chronic combined systolic (congestive) and diastolic (congestive) heart failure (HCC) -In the setting of diet indiscretion most likely. -Demonstrating adequate diuresis with IV Lasix -Patient denies chest pain, no palpitations. -Continue to follow daily weights, strict I's and  O's and low-sodium diet -Cardiology service on board for GDMT; will follow recommendation. -continue IV diuretics, b-blocker and ivabradine.  Hypokalemia - In the setting of diuresis -Magnesium within normal limit -Follow ultralights and replete as needed.  Anemia - Anemia of chronic disease -No overt bleeding -Stable hemoglobin -Continue to follow trend. -No transfusion needed.  COPD (chronic obstructive pulmonary disease) (HCC) - No wheezing currently -Shortness of breath symptoms driven by heart failure exacerbation -Continue bronchodilator management.  Glaucoma -Chronically on netarsudil dimesylate -medication not available in house; family made aware to bring eyedrops for tx continuation.    Low back pain -Chronic and unchanged -Continue as needed analgesics.  Essential hypertension -stable and well controlled. -Continue current antihypertensive regimen -Follow vital signs.   Subjective:  No chest pain, no nausea, no vomiting; reports feeling short winded with activity and having mild orthopnea.  No requiring oxygen supplementation.  Expressed good urine output.  Physical Exam: Vitals:   08/26/21 0300 08/26/21 0403 08/26/21 0425 08/26/21 0442  BP: 95/80 116/75 113/78   Pulse: 93 96 (!) 102   Resp: 20 (!) 22 18   Temp:  98.4 F (36.9 C) 97.8 F (36.6 C)   TempSrc:  Oral Oral   SpO2: 100% 100% 100%   Weight:    77.9 kg  Height:       General exam: Alert, awake, oriented x 3; no overnight events.  Short winded with activity and expressing mild orthopnea.  No chest pain. Respiratory system: Decreased breath sounds at the bases; no wheezing, no using accessory muscle. Cardiovascular system:RRR. No  rubs or gallops. Gastrointestinal system: Abdomen is nondistended, soft and nontender. No organomegaly or masses felt. Normal bowel sounds heard. Central nervous system: Alert and oriented. No focal neurological deficits. Extremities: No cyanosis or  clubbing. Skin: No petechiae. Psychiatry: Judgement and insight appear normal. Mood & affect appropriate.   Data Reviewed: Basic metabolic panel demonstrating sodium 139, potassium 3.2, chloride 103, BUN 13, creatinine 1.20 CBC: WBC 6.7, hemoglobin 13.7, platelet count 255 K  Family Communication: No family at bedside.  Disposition: Status is: Inpatient Remains inpatient appropriate because: Receiving IV diuresis for his CHF exacerbation.   Planned Discharge Destination: Home   Author: Barton Dubois, MD 08/26/2021 7:47 AM  For on call review [email or web address].

## 2021-08-26 NOTE — Assessment & Plan Note (Addendum)
-   Anemia of chronic disease -No overt bleeding -Stable hemoglobin -Continue to follow trend. -No transfusion needed.  Iron deficiency, continue with oral iron supplementation.

## 2021-08-26 NOTE — Assessment & Plan Note (Signed)
-  Chronic and unchanged -Continue as needed analgesics.  Positive anxiety, added alprazolam as needed.

## 2021-08-26 NOTE — Progress Notes (Signed)
Heart Failure Navigator Progress Note  Assessed for Heart & Vascular TOC clinic readiness.  Patient does not meet criteria due to established patient with advanced heart failure team. .     Earnestine Leys, BSN, RN Heart Failure Nurse Navigator Secure Chat Only

## 2021-08-26 NOTE — Assessment & Plan Note (Signed)
Increased midodrine to 10 mg tid. Holding antihypertensive medications.   Dyslipidemia, continue with statin therapy.

## 2021-08-26 NOTE — Assessment & Plan Note (Addendum)
Echocardiogram from 05/19 with reduced LV systolic function less than 20%, global hypokinesis, moderate enlarged cavity. Severe reduction in RV systolic function. RVSP 45 mmHg. No significant valvular disease.   Urine output over last 24 hrs is 162 ml Systolic blood pressure 99 to 100 mmHg.    Continue medical therapy with ivabridine, metoprolol and midodrine. Limited medical therapy due to risk of hypotension. Continue to hold on furosemide for today, due to risk of hypotension and worsening renal function.

## 2021-08-26 NOTE — Assessment & Plan Note (Signed)
-   No wheezing currently -Shortness of breath symptoms driven by heart failure exacerbation -Continue bronchodilator management.

## 2021-08-27 DIAGNOSIS — I951 Orthostatic hypotension: Secondary | ICD-10-CM

## 2021-08-27 DIAGNOSIS — E876 Hypokalemia: Secondary | ICD-10-CM | POA: Diagnosis not present

## 2021-08-27 DIAGNOSIS — I10 Essential (primary) hypertension: Secondary | ICD-10-CM | POA: Diagnosis not present

## 2021-08-27 DIAGNOSIS — H409 Unspecified glaucoma: Secondary | ICD-10-CM | POA: Diagnosis not present

## 2021-08-27 DIAGNOSIS — I5043 Acute on chronic combined systolic (congestive) and diastolic (congestive) heart failure: Secondary | ICD-10-CM | POA: Diagnosis not present

## 2021-08-27 MED ORDER — POTASSIUM CHLORIDE CRYS ER 20 MEQ PO TBCR
40.0000 meq | EXTENDED_RELEASE_TABLET | Freq: Two times a day (BID) | ORAL | Status: DC
  Administered 2021-08-27 (×2): 40 meq via ORAL
  Filled 2021-08-27 (×2): qty 2

## 2021-08-27 NOTE — Progress Notes (Signed)
Pharmacist Heart Failure Core Measure Documentation  Assessment: Ryan [name] has an EF documented as <20 on 07/29/21 by echo.  Rationale: Heart failure patients with left ventricular systolic dysfunction (LVSD) and an EF < 40% should be prescribed an angiotensin converting enzyme inhibitor (ACEI) or angiotensin receptor blocker (ARB) at discharge unless a contraindication is documented in the medical record.  This patient is not currently on an ACEI or ARB for HF.  This note is being placed in the record in order to provide documentation that a contraindication to the use of these agents is present for this encounter.  ACE Inhibitor or Angiotensin Receptor Blocker is contraindicated (specify all that apply)  '[]'$   ACEI allergy AND ARB allergy '[]'$   Angioedema '[]'$   Moderate or severe aortic stenosis '[]'$   Hyperkalemia '[x]'$   Hypotension '[]'$   Renal artery stenosis '[]'$   Worsening renal function, preexisting renal disease or dysfunction   Ursula Beath 08/27/2021 1:18 PM

## 2021-08-27 NOTE — Progress Notes (Signed)
Progress Note   Patient: Ryan [name] YFV:[id number] DOB: [birth year] DOA: 08/25/2021     2 DOS: the patient was seen and examined on 08/27/2021   Brief hospital course: Mr. Hogg was admitted to the hospital with the working diagnosis of decompensated heart failure.  71 yo male with the past medical history of hypertension, COPD, heart failure and CKD stage 2 who presented with dyspnea, and edema. Reported several days of worsening symptoms, associated with edema and orthopnea. As outpatient his furosemide was increased with no significant improvement in his symptoms. On his initial physical examination his blood pressure was 111/78, HR 98, RR 24 and 02 saturation 99%, lungs with bilateral rales, heart with S1 and S2 present and rhythmic, abdomen soft and no lower extremity edema.   Na 137, K 3,7 CL 104 bicarbonate 22, glucose 108 bun 15 cr 1,1 BNP 1,922 High sensitive troponin 19 and 18  Wbc 7,0 hgb 12,8 plt 239   Chest radiograph with cardiomegaly with bilateral hilar vascular congestion and bilateral interstitial infiltrates.   EKG 114 bpm, left axis deviation, qtc 515, sinus rhythm, with no significant ST segment or T wave changes. Positive LVH.   Patient was placed on furosemide for diuresis.     Assessment and Plan: * Acute on chronic combined systolic (congestive) and diastolic (congestive) heart failure (HCC) Echocardiogram from 05/19 with reduced LV systolic function less than 20%, global hypokinesis, moderate enlarged cavity. Severe reduction in RV systolic function. RVSP 45 mmHg. No significant valvular disease.   Urine output over last 24 hrs is 1,638 ml Systolic blood pressure 96 to 90 mmHg.   Improved volume status per physical examination.  Plan to continue holding furosemide for today.  Continue with ivabridine, metoprolol and midodrine. Limited medical therapy due to risk of hypotension.    Hypokalemia Renal function with serum cr at 1,2. K is 3,2 and serum  bicarbonate at 25.  Plan to continue k supplementation Follow up on renal function in am. To consider adding spironolactone.   Essential hypertension Continue with metoprolol. Blood pressure support with midodrine.   Glaucoma -Chronically on netarsudil dimesylate -medication not available in house; family made aware to bring eyedrops for tx continuation.    COPD (chronic obstructive pulmonary disease) (HCC) - No wheezing currently -Shortness of breath symptoms driven by heart failure exacerbation -Continue bronchodilator management.  Anemia - Anemia of chronic disease -No overt bleeding -Stable hemoglobin -Continue to follow trend. -No transfusion needed.  Orthostatic hypotension Hold diuresis for today.   Low back pain -Chronic and unchanged -Continue as needed analgesics.        Subjective: Dyspnea has been improving, positive hypotension today.   Physical Exam: Vitals:   08/27/21 0937 08/27/21 1000 08/27/21 1100 08/27/21 1130  BP: 96/69   92/68  Pulse: 79 75 76 81  Resp:  '19 11 18  '$ Temp:    97.8 F (36.6 C)  TempSrc:    Oral  SpO2:  99% 100% 100%  Weight:      Height:       Neurology awake and alert ENT with mild pallor Cardiovascular with S1 and S2 present and rhythmic, no gallops or murmurs No JVD No lower extremity edema Respiratory with no wheezing or rales Abdomen not distended  Data Reviewed:    Family Communication: no family at the bedside  Disposition: Status is: Inpatient Remains inpatient appropriate because: heart failure with hypotension   Planned Discharge Destination: Home      Author: Tawni Millers,  MD 08/27/2021 4:19 PM  For on call review [email or web address].

## 2021-08-27 NOTE — Plan of Care (Signed)
?  Problem: Activity: ?Goal: Capacity to carry out activities will improve ?Outcome: Progressing ?  ?Problem: Clinical Measurements: ?Goal: Respiratory complications will improve ?Outcome: Progressing ?  ?Problem: Activity: ?Goal: Risk for activity intolerance will decrease ?Outcome: Progressing ?  ?
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
## 2021-08-27 NOTE — Hospital Course (Signed)
Mr. Deringer was admitted to the hospital with the working diagnosis of decompensated heart failure.  71 yo male with the past medical history of hypertension, COPD, heart failure and CKD stage 2 who presented with dyspnea, and edema. Reported several days of worsening symptoms, associated with edema and orthopnea. As outpatient his furosemide was increased with no significant improvement in his symptoms. On his initial physical examination his blood pressure was 111/78, HR 98, RR 24 and 02 saturation 99%, lungs with bilateral rales, heart with S1 and S2 present and rhythmic, abdomen soft and no lower extremity edema.   Na 137, K 3,7 CL 104 bicarbonate 22, glucose 108 bun 15 cr 1,1 BNP 1,922 High sensitive troponin 19 and 18  Wbc 7,0 hgb 12,8 plt 239   Chest radiograph with cardiomegaly with bilateral hilar vascular congestion and bilateral interstitial infiltrates.   EKG 114 bpm, left axis deviation, qtc 515, sinus rhythm, with no significant ST segment or T wave changes. Positive LVH.   Patient was placed on furosemide for diuresis.

## 2021-08-27 NOTE — Assessment & Plan Note (Signed)
Hold diuresis for today.

## 2021-08-28 DIAGNOSIS — N179 Acute kidney failure, unspecified: Secondary | ICD-10-CM | POA: Diagnosis not present

## 2021-08-28 DIAGNOSIS — I5043 Acute on chronic combined systolic (congestive) and diastolic (congestive) heart failure: Secondary | ICD-10-CM | POA: Diagnosis not present

## 2021-08-28 DIAGNOSIS — I10 Essential (primary) hypertension: Secondary | ICD-10-CM | POA: Diagnosis not present

## 2021-08-28 DIAGNOSIS — I951 Orthostatic hypotension: Secondary | ICD-10-CM | POA: Diagnosis not present

## 2021-08-28 DIAGNOSIS — E876 Hypokalemia: Secondary | ICD-10-CM | POA: Diagnosis not present

## 2021-08-28 DIAGNOSIS — H409 Unspecified glaucoma: Secondary | ICD-10-CM | POA: Diagnosis not present

## 2021-08-28 LAB — BASIC METABOLIC PANEL
Anion gap: 13 (ref 5–15)
BUN: 23 mg/dL (ref 8–23)
CO2: 20 mmol/L — ABNORMAL LOW (ref 22–32)
Calcium: 9.6 mg/dL (ref 8.9–10.3)
Chloride: 105 mmol/L (ref 98–111)
Creatinine, Ser: 1.29 mg/dL — ABNORMAL HIGH (ref 0.61–1.24)
GFR, Estimated: 59 mL/min — ABNORMAL LOW (ref >60)
Glucose, Bld: 126 mg/dL — ABNORMAL HIGH (ref 70–99)
Potassium: 5.1 mmol/L (ref 3.5–5.1)
Sodium: 138 mmol/L (ref 135–145)

## 2021-08-28 MED ORDER — ALPRAZOLAM 0.25 MG PO TABS
0.2500 mg | ORAL_TABLET | Freq: Three times a day (TID) | ORAL | Status: DC | PRN
  Administered 2021-08-28 – 2021-09-01 (×10): 0.25 mg via ORAL
  Filled 2021-08-28 (×11): qty 1

## 2021-08-28 MED ORDER — ALPRAZOLAM 0.25 MG PO TABS: 0.2500 mg | ORAL_TABLET | Freq: Every evening | ORAL | Status: DC | PRN

## 2021-08-28 NOTE — Progress Notes (Signed)
Mobility Specialist: Progress Note   08/28/21 1640  Mobility  Activity Ambulated with assistance in hallway  Level of Assistance Standby assist, set-up cues, supervision of patient - no hands on  Assistive Device Cane  Distance Ambulated (ft) 420 ft  Activity Response Tolerated well  $Mobility charge 1 Mobility   Pre-Mobility: 76 HR, 100% SpO2 Post-Mobility: 100 HR,  Received pt in bed having no complaints and agreeable to mobility. Pt was asymptomatic throughout ambulation and returned to room w/o fault. Left in bed w/ call bell in reach and all needs met.  [facility] Ryan [name] Mobility Specialist Mobility Specialist [address]: [phone]
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
## 2021-08-28 NOTE — Progress Notes (Addendum)
Progress Note   Patient: Ryan [name] JSH:[id number] DOB: Sep 16, 1950 DOA: 08/25/2021     3 DOS: the patient was seen and examined on 08/28/2021   Brief hospital course: Mr. Errico was admitted to the hospital with the working diagnosis of decompensated heart failure.  71 yo male with the past medical history of hypertension, COPD, heart failure and CKD stage 2 who presented with dyspnea, and edema. Reported several days of worsening symptoms, associated with edema and orthopnea. As outpatient his furosemide was increased with no significant improvement in his symptoms. On his initial physical examination his blood pressure was 111/78, HR 98, RR 24 and 02 saturation 99%, lungs with bilateral rales, heart with S1 and S2 present and rhythmic, abdomen soft and no lower extremity edema.   Na 137, K 3,7 CL 104 bicarbonate 22, glucose 108 bun 15 cr 1,1 BNP 1,922 High sensitive troponin 19 and 18  Wbc 7,0 hgb 12,8 plt 239   Chest radiograph with cardiomegaly with bilateral hilar vascular congestion and bilateral interstitial infiltrates.   EKG 114 bpm, left axis deviation, qtc 515, sinus rhythm, with no significant ST segment or T wave changes. Positive LVH.   Patient was placed on furosemide for diuresis.     Assessment and Plan: * Acute on chronic combined systolic (congestive) and diastolic (congestive) heart failure (HCC) Echocardiogram from 05/19 with reduced LV systolic function less than 20%, global hypokinesis, moderate enlarged cavity. Severe reduction in RV systolic function. RVSP 45 mmHg. No significant valvular disease.   Urine output over last 24 hrs is 850 ml Systolic blood pressure 99 to 100 mmHg.    Continue medical therapy with ivabridine, metoprolol and midodrine. Limited medical therapy due to risk of hypotension. Continue to hold on furosemide for today, due to risk of hypotension and worsening renal function.    AKI (acute kidney injury) ([place]) Hypokalemia.   Renal  function with serum cr at 1,29 with K at 5,1 and serum bicarbonate at 20  Hold on K supplementation Continue to hold on furosemide Follow up renal function in am, avoid hypotension and nephrotoxic medications.   Essential hypertension Continue with metoprolol. Blood pressure support with midodrine.   Dyslipidemia, continue with statin therapy.   Glaucoma -Chronically on netarsudil dimesylate -medication not available in house; family made aware to bring eyedrops for tx continuation.    COPD (chronic obstructive pulmonary disease) (HCC) - No wheezing currently -Shortness of breath symptoms driven by heart failure exacerbation -Continue bronchodilator management.  Anemia - Anemia of chronic disease -No overt bleeding -Stable hemoglobin -Continue to follow trend. -No transfusion needed.  Orthostatic hypotension Hold diuresis for today.   Low back pain -Chronic and unchanged -Continue as needed analgesics.  Positive anxiety, added alprazolam as needed.         Subjective: Patient is feeling better, no dyspnea on exertion. Patient with anxiety symptoms due to fair of dyspnea and orthopnea.   Physical Exam: Vitals:   08/27/21 1626 08/27/21 2052 08/28/21 0529 08/28/21 0814  BP: 93/72 99/74  100/75  Pulse: 76 78  90  Resp: '18 18  15  '$ Temp: 98 F (36.7 C) 98 F (36.7 C)  98 F (36.7 C)  TempSrc: Oral Oral Oral Oral  SpO2: 97% 93%  99%  Weight:      Height:       Neurology awake and alert ENT with no pallor or icterus Cardiovascular with S1 and S2 present and rhythmic with no gallops, rubs or murmurs No JVD No  lower extremity edema Respiratory with no rales or wheezing Abdomen soft and not distended  Data Reviewed:    Family Communication: no family at the bedside   Disposition: Status is: Inpatient Remains inpatient appropriate because: pending improvement in renal function   Planned Discharge Destination: Home  Author: Tawni Millers,  MD 08/28/2021 12:42 PM  For on call review [email or web address].

## 2021-08-28 NOTE — Plan of Care (Signed)
  Problem: Activity: Goal: Capacity to carry out activities will improve Outcome: Progressing   Problem: Clinical Measurements: Goal: Respiratory complications will improve Outcome: Progressing   

## 2021-08-28 NOTE — Progress Notes (Signed)
Pt. Refused am labs this am.

## 2021-08-29 DIAGNOSIS — E876 Hypokalemia: Secondary | ICD-10-CM | POA: Diagnosis not present

## 2021-08-29 DIAGNOSIS — I951 Orthostatic hypotension: Secondary | ICD-10-CM | POA: Diagnosis not present

## 2021-08-29 DIAGNOSIS — I10 Essential (primary) hypertension: Secondary | ICD-10-CM | POA: Diagnosis not present

## 2021-08-29 DIAGNOSIS — H409 Unspecified glaucoma: Secondary | ICD-10-CM | POA: Diagnosis not present

## 2021-08-29 DIAGNOSIS — N179 Acute kidney failure, unspecified: Secondary | ICD-10-CM | POA: Diagnosis not present

## 2021-08-29 DIAGNOSIS — I5043 Acute on chronic combined systolic (congestive) and diastolic (congestive) heart failure: Secondary | ICD-10-CM | POA: Diagnosis not present

## 2021-08-29 LAB — BASIC METABOLIC PANEL
Anion gap: 9 (ref 5–15)
BUN: 22 mg/dL (ref 8–23)
CO2: 22 mmol/L (ref 22–32)
Calcium: 9.5 mg/dL (ref 8.9–10.3)
Chloride: 108 mmol/L (ref 98–111)
Creatinine, Ser: 1.57 mg/dL — ABNORMAL HIGH (ref 0.61–1.24)
GFR, Estimated: 47 mL/min — ABNORMAL LOW (ref >60)
Glucose, Bld: 88 mg/dL (ref 70–99)
Potassium: 4.7 mmol/L (ref 3.5–5.1)
Sodium: 139 mmol/L (ref 135–145)

## 2021-08-29 MED ORDER — FUROSEMIDE 10 MG/ML IJ SOLN
80.0000 mg | Freq: Two times a day (BID) | INTRAMUSCULAR | Status: DC
  Administered 2021-08-29 – 2021-08-30 (×4): 80 mg via INTRAVENOUS
  Filled 2021-08-29 (×5): qty 8

## 2021-08-29 MED ORDER — MIDODRINE HCL 5 MG PO TABS
10.0000 mg | ORAL_TABLET | Freq: Three times a day (TID) | ORAL | Status: DC
Start: 2021-08-29 — End: 2021-09-01
  Administered 2021-08-29 – 2021-09-01 (×8): 10 mg via ORAL
  Filled 2021-08-29 (×9): qty 2
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
## 2021-08-29 NOTE — Care Management Important Message (Signed)
Important Message  Patient Details  Name: Ryan [name] MRN: [id number] Date of Birth: 02-05-1951   Medicare Important Message Given:  Other (see comment)     Hannah Beat 08/29/2021, 2:12 PM

## 2021-08-29 NOTE — Progress Notes (Signed)
6/19 Spoke to patient via telephone, initial letter has been rcv'd by patient. Letter explained and acknowledged.

## 2021-08-29 NOTE — Progress Notes (Signed)
Mobility Specialist: Progress Note   08/29/21 1415  Mobility  Activity Refused mobility   Pt refused mobility stating he ambulated this morning and was feeling a little light headed. Will f/u as able.   [facility] Nain Rudd Mobility Specialist Mobility Specialist [address]: [phone]

## 2021-08-29 NOTE — Progress Notes (Signed)
Progress Note   Patient: Ryan [name] [id number]:[id number] DOB: [birth year] DOA: 08/25/2021     4 DOS: the patient was seen and examined on 08/29/2021   Brief hospital course: Mr. Lachapelle was admitted to the hospital with the working diagnosis of decompensated heart failure.  71 yo male with the past medical history of hypertension, COPD, heart failure and CKD stage 2 who presented with dyspnea, and edema. Reported several days of worsening symptoms, associated with edema and orthopnea. As outpatient his furosemide was increased with no significant improvement in his symptoms. On his initial physical examination his blood pressure was 111/78, HR 98, RR 24 and 02 saturation 99%, lungs with bilateral rales, heart with S1 and S2 present and rhythmic, abdomen soft and no lower extremity edema.   Na 137, K 3,7 CL 104 bicarbonate 22, glucose 108 bun 15 cr 1,1 BNP 1,922 High sensitive troponin 19 and 18  Wbc 7,0 hgb 12,8 plt 239   Chest radiograph with cardiomegaly with bilateral hilar vascular congestion and bilateral interstitial infiltrates.   EKG 114 bpm, left axis deviation, qtc 515, sinus rhythm, with no significant ST segment or T wave changes. Positive LVH.   Patient was placed on furosemide for diuresis with improvement of his symptoms. He has developed AKI and hypotension. Placed on midodrine and waiting renal function to improve before patient can be discharged.     Assessment and Plan: * Acute on chronic combined systolic (congestive) and diastolic (congestive) heart failure (HCC) Echocardiogram from 05/19 with reduced LV systolic function less than 20%, global hypokinesis, moderate enlarged cavity. Severe reduction in RV systolic function. RVSP 45 mmHg. No significant valvular disease.   Acute on chronic core pulmonale.   Urine output over last 24 hrs is 607 ml Systolic blood pressure 99 to 116 mmHg.    On ivabridine, metoprolol and midodrine (increase dose to 10 mg tid).    Limited medical therapy due to risk of hypotension. Continue to hold on furosemide for today, due to risk of hypotension and worsening renal function.    AKI (acute kidney injury) ([place]) Hypokalemia.   Patient with worsening renal function per serum cr, today is up to 1,57 with K at 4,7 and serum bicarbonate at 22.   Continue blood pressure support with midodrine and follow up renal function in am. Avoid hypotension and nephrotoxic medications.  Continue to hold on diuretic therapy.   Essential hypertension Increased midodrine to 10 mg tid. Holding antihypertensive medications.   Dyslipidemia, continue with statin therapy.   Glaucoma -Chronically on netarsudil dimesylate -medication not available in house; family made aware to bring eyedrops for tx continuation.    COPD (chronic obstructive pulmonary disease) (HCC) - No wheezing currently -Shortness of breath symptoms driven by heart failure exacerbation -Continue bronchodilator management.  Anemia - Anemia of chronic disease -No overt bleeding -Stable hemoglobin -Continue to follow trend. -No transfusion needed.  Iron deficiency, continue with oral iron supplementation.   Orthostatic hypotension Hold diuresis for today.   Low back pain -Chronic and unchanged -Continue as needed analgesics.  Positive anxiety, added alprazolam as needed.         Subjective: Patient with no chest pain or dyspnea, no edema.   Physical Exam: Vitals:   08/29/21 0515 08/29/21 0736 08/29/21 0737 08/29/21 0902  BP: (!) 1'16/92 99/71 99/71 '$ 99/71  Pulse: 80 79 79 79  Resp: '18 10 10 10  '$ Temp: (!) 97.4 F (36.3 C) (!) 97.2 F (36.2 C) (!) 97.2 F (36.2 C) (!)  97.2 F (36.2 C)  TempSrc: Oral Oral    SpO2: 100% 100%    Weight:      Height:       Neurology awake and alert ENT With no pallor Cardiovascular with S1 and S2 present and rhythmic, no murmurs, rubs or gallops No JVD No lower extremity edema Respiratory with no  rales or wheezing Abdomen not distended Data Reviewed:    Family Communication: no family at the bedside   Disposition: Status is: Inpatient Remains inpatient appropriate because: worsening renal function and hypotension   Planned Discharge Destination: Home    Author: Tawni Millers, MD 08/29/2021 9:59 AM  For on call review [email or web address].
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
## 2021-08-30 DIAGNOSIS — I10 Essential (primary) hypertension: Secondary | ICD-10-CM | POA: Diagnosis not present

## 2021-08-30 DIAGNOSIS — N179 Acute kidney failure, unspecified: Secondary | ICD-10-CM | POA: Diagnosis not present

## 2021-08-30 DIAGNOSIS — H409 Unspecified glaucoma: Secondary | ICD-10-CM | POA: Diagnosis not present

## 2021-08-30 DIAGNOSIS — I5043 Acute on chronic combined systolic (congestive) and diastolic (congestive) heart failure: Secondary | ICD-10-CM | POA: Diagnosis not present

## 2021-08-30 LAB — BASIC METABOLIC PANEL WITH GFR
Anion gap: 11 (ref 5–15)
BUN: 25 mg/dL — ABNORMAL HIGH (ref 8–23)
CO2: 21 mmol/L — ABNORMAL LOW (ref 22–32)
Calcium: 8.9 mg/dL (ref 8.9–10.3)
Chloride: 104 mmol/L (ref 98–111)
Creatinine, Ser: 1.4 mg/dL — ABNORMAL HIGH (ref 0.61–1.24)
GFR, Estimated: 54 mL/min — ABNORMAL LOW (ref >60)
Glucose, Bld: 173 mg/dL — ABNORMAL HIGH (ref 70–99)
Potassium: 4.2 mmol/L (ref 3.5–5.1)
Sodium: 136 mmol/L (ref 135–145)

## 2021-08-30 MED ORDER — POTASSIUM CHLORIDE CRYS ER 20 MEQ PO TBCR
20.0000 meq | EXTENDED_RELEASE_TABLET | Freq: Once | ORAL | Status: AC
  Administered 2021-08-30: 20 meq via ORAL
  Filled 2021-08-30: qty 1

## 2021-08-30 NOTE — Progress Notes (Signed)
Progress Note   Patient: Ryan [name] FWY:[id number] DOB: 1950-03-26 DOA: 08/25/2021     5 DOS: the patient was seen and examined on 08/30/2021   Brief hospital course: Ryan [name] was admitted to the hospital with the working diagnosis of decompensated heart failure.  71 yo male with the past medical history of hypertension, COPD, heart failure and CKD stage 2 who presented with dyspnea, and edema. Reported several days of worsening symptoms, associated with edema and orthopnea. As outpatient his furosemide was increased with no significant improvement in his symptoms. On his initial physical examination his blood pressure was 111/78, HR 98, RR 24 and 02 saturation 99%, lungs with bilateral rales, heart with S1 and S2 present and rhythmic, abdomen soft and no lower extremity edema.   Na 137, K 3,7 CL 104 bicarbonate 22, glucose 108 bun 15 cr 1,1 BNP 1,922 High sensitive troponin 19 and 18  Wbc 7,0 hgb 12,8 plt 239   Chest radiograph with cardiomegaly with bilateral hilar vascular congestion and bilateral interstitial infiltrates.   EKG 114 bpm, left axis deviation, qtc 515, sinus rhythm, with no significant ST segment or T wave changes. Positive LVH.   Patient was placed on furosemide for diuresis with improvement of his symptoms. He has developed AKI and hypotension. Placed on midodrine for blood pressure support.   06/20 renal function now improving, possible dc home in 24 hrs.   Assessment and Plan: * Acute on chronic combined systolic (congestive) and diastolic (congestive) heart failure (HCC) Echocardiogram from 05/19 with reduced LV systolic function less than 20%, global hypokinesis, moderate enlarged cavity. Severe reduction in RV systolic function. RVSP 45 mmHg. No significant valvular disease.   Acute on chronic core pulmonale.   Urine output over last 24 hrs is 2774 ml Systolic blood pressure 94 to 104 mmHg.    Continue with ivabridine, metoprolol and midodrine for  medical therapy.   Diuresis with furosemide 80 mg IV q12 hrs.   Limited therapy due to risk of hypotension.    AKI (acute kidney injury) ([place]) Hypokalemia.   Furosemide has been resumed with good toleration, renal function with serum cr at 1,40 with K at 4,2 and serum bicarbonate at 21.   Plan to continue with midodrine for blood pressure control and continue diuresis with IV furosemide. Plan to follow up renal function in am.   Essential hypertension Continue with midodrine to 10 mg tid. Diuresis with furosemide.   Dyslipidemia, continue with statin therapy.   Glaucoma -Chronically on netarsudil dimesylate -medication not available in house; family made aware to bring eyedrops for tx continuation.    COPD (chronic obstructive pulmonary disease) (HCC) - No wheezing currently -Shortness of breath symptoms driven by heart failure exacerbation -Continue bronchodilator management.  Anemia - Anemia of chronic disease -No overt bleeding -Stable hemoglobin -Continue to follow trend. -No transfusion needed.  Iron deficiency, continue with oral iron supplementation.   Orthostatic hypotension Hold diuresis for today.   Low back pain -Chronic and unchanged -Continue as needed analgesics.  Positive anxiety, added alprazolam as needed.         Subjective: Patient is feeling better, no chest pain, dyspnea is improving.   Physical Exam: Vitals:   08/29/21 2009 08/30/21 0420 08/30/21 0742 08/30/21 1202  BP: 111/73 104/69 104/70 91/72  Pulse: 72 65 79 74  Resp: '18 18 20 18  '$ Temp: 97.8 F (36.6 C) 97.8 F (36.6 C) 97.8 F (36.6 C)   TempSrc:   Oral   SpO2:  98% 99% 97% 100%  Weight:      Height:       Neurology awake and alert ENT with mild pallor Cardiovascular with S1 and S2 present and rhythmic with no gallops, rubs or murmurs Respiratory with no rales or wheezing  Abdomen soft and not distended No lower extremity edema  Data Reviewed:    Family  Communication: no family at the bedside   Disposition: Status is: Inpatient Remains inpatient appropriate because: heart failure   Planned Discharge Destination: Home    Author: Tawni Millers, MD 08/30/2021 1:39 PM  For on call review [email or web address].

## 2021-08-30 NOTE — Progress Notes (Signed)
Mobility Specialist Progress Note:   08/30/21 0945  Mobility  Activity Ambulated independently to bathroom  Level of Assistance Independent  Assistive Device Cane  Distance Ambulated (ft) 20 ft  Activity Response Tolerated well  $Mobility charge 1 Mobility   Pt ambulating to BR upon entry. No complaints. Will follow-up shortly for hallway ambulation.   [facility] Burnis Halling Mobility Specialist

## 2021-08-30 NOTE — Progress Notes (Signed)
Mobility Specialist Progress Note:   08/30/21 1036  Mobility  Activity Ambulated with assistance in hallway  Level of Assistance Independent  Assistive Device Cane  Distance Ambulated (ft) 550 ft  Activity Response Tolerated well  $Mobility charge 1 Mobility   Pt received EOB asking to walk. No complaints of pain. Left EOB with call bell in reach and all needs met.   [facility] Codee Tutson Mobility Specialist

## 2021-08-30 NOTE — Progress Notes (Addendum)
Advanced Heart Failure Rounding Note  PCP-Cardiologist: Carlyle Dolly, MD  HF: Dr. Haroldine Laws  Subjective:     Brisk diuresis yesterday with IV lasix. No weight today. Scr improving, 1.57>1.40.   SBP stable low 100s  Feeling somewhat better. Still short of breath with conversation and activity.   Objective:   Weight Range: 78 kg Body mass index is 23.98 kg/m.   Vital Signs:   Temp:  [97.2 F (36.2 C)-97.8 F (36.6 C)] 97.8 F (36.6 C) (06/20 0742) Pulse Rate:  [65-84] 79 (06/20 0742) Resp:  [10-22] 20 (06/20 0742) BP: (98-113)/(69-87) 104/70 (06/20 0742) SpO2:  [97 %-100 %] 97 % (06/20 0742) Last BM Date : 08/26/21  Weight change: Filed Weights   08/26/21 0442 08/27/21 0355 08/29/21 0506  Weight: 77.9 kg 73.3 kg 78 kg    Intake/Output:   Intake/Output Summary (Last 24 hours) at 08/30/2021 0808 Last data filed at 08/30/2021 0419 Gross per 24 hour  Intake 600 ml  Output 4000 ml  Net -3400 ml      Physical Exam    General:  No distress. Lying comfortably in bed. HEENT: Normal Neck: Supple. JVP 12-14. Carotids 2+ bilat; no bruits.  Cor: PMI nondisplaced. Regular rate & rhythm. No rubs, gallops or murmurs. Lungs: Clear Abdomen: Soft, nontender, nondistended.  Extremities: No cyanosis, clubbing, rash, edema Neuro: Alert & orientedx3, cranial nerves grossly intact. moves all 4 extremities w/o difficulty. Affect pleasant   Telemetry   SR 60s-70s  Labs    CBC No results for input(s): "WBC", "NEUTROABS", "HGB", "HCT", "MCV", "PLT" in the last 72 hours. Basic Metabolic Panel Recent Labs    08/29/21 0406 08/30/21 0410  NA 139 136  K 4.7 4.2  CL 108 104  CO2 22 21*  GLUCOSE 88 173*  BUN 22 25*  CREATININE 1.57* 1.40*  CALCIUM 9.5 8.9   Liver Function Tests No results for input(s): "AST", "ALT", "ALKPHOS", "BILITOT", "PROT", "ALBUMIN" in the last 72 hours. No results for input(s): "LIPASE", "AMYLASE" in the last 72 hours. Cardiac  Enzymes No results for input(s): "CKTOTAL", "CKMB", "CKMBINDEX", "TROPONINI" in the last 72 hours.  BNP: BNP (last 3 results) Recent Labs    08/03/21 1001 08/22/21 0954 08/25/21 1222  BNP 1,334.1* 1,892.6* 1,922.3*    ProBNP (last 3 results) No results for input(s): "PROBNP" in the last 8760 hours.   D-Dimer No results for input(s): "DDIMER" in the last 72 hours. Hemoglobin A1C No results for input(s): "HGBA1C" in the last 72 hours. Fasting Lipid Panel No results for input(s): "CHOL", "HDL", "LDLCALC", "TRIG", "CHOLHDL", "LDLDIRECT" in the last 72 hours. Thyroid Function Tests No results for input(s): "TSH", "T4TOTAL", "T3FREE", "THYROIDAB" in the last 72 hours.  Invalid input(s): "FREET3"  Other results:   Imaging    No results found.   Medications:     Scheduled Medications:  dorzolamide-timolol  1 drop Both Eyes BID   enoxaparin (LOVENOX) injection  40 mg Subcutaneous Q24H   ferrous sulfate  325 mg Oral QODAY   furosemide  80 mg Intravenous BID   gabapentin  600 mg Oral BID   ivabradine  5 mg Oral BID WC   latanoprost  1 drop Both Eyes QHS   lipase/protease/amylase  36,000 Units Oral TID WC   midodrine  10 mg Oral TID WC   Netarsudil Dimesylate  1 drop Both Eyes QHS   pantoprazole  40 mg Oral QHS   rosuvastatin  20 mg Oral Daily   sodium chloride flush  3 mL Intravenous Q12H    Infusions:   PRN Medications: acetaminophen **OR** acetaminophen, albuterol, ALPRAZolam, gabapentin, polyethylene glycol, traMADol    Patient Profile   71 y.o. male with history of chronic systolic CHF, chronic CP, orthostatic hypotension, CKD IIIa, hx polysubstance abuse, noncompliance with medical therapy. Admitted with a/c CHF.  Assessment/Plan   Acute on Chronic Systolic Heart Failure. - Echo (9/17): EF 20-25%, Grade 1 DD.  - Echo (9/18): EF 10-15%.  - Echo (8/20): EF 10-15% - Had R/LHC (11/18): with normal coronaries and normal hemodynamics.  - Echo (11/22)  ([facility]) EF 15-20% mild MR. RV ok  - Echo (5/23) EF < 20%, RV severely reduced, RVSP 46 mmHg, moderate MR - Multiple recent admissions with a/c CHF. Recently failed outpatient escalation in diuretics and not taking correctly. - Good diuresis with IV lasix 80 BID. Continue today. Still appears volume overloaded. - ? May be low output. If Scr continues to trend up with another attempt at diuresis, may need RHC to assess filling pressures and CO. - Continue Ivabradine 5 mg BID - Metoprolol held on admit d/t orthostatic hypotension - Has been off entresto and spiro with low BP - Continue midodrine 10 TID - Cooks with seasonings containing salt. Discussed limiting sodium intake. - No currently candidate for advanced therapies or ICD at this point   2. Chest Pain   - Normal cors 01/2017  - HS troponin minimally elevated 19>18 - No evidence ACS. Minimal troponin elevation likely 2/2 demand ischemia   3. CKD stage IIIa - Creatinine baseline 1.1-1.4.  - Scr trending up this admit, 1.1>1.2>1.3>1.57>1.40. Renal function now improving with diuresis.  - Follow closely   4. Orthostatic hypotension - Continue midodrine 10 TID - Continue abdominal binder and TED hose   5. ETOH abuse - Sober x 8 months. - Congratulated.   6. Tobacco use - Cut back, now smoking 1-3 cigarettes a day - Cessation discussed   7. Chronic ETOH pancreatitis - Has quit ETOH - Now s/p biliary stent  - Follows with [facility]   Length of Stay: 5  FINCH, LINDSAY N, PA-C  08/30/2021, 8:08 AM  Advanced Heart Failure Team Pager [phone] (M-F; 7a - 5p)  Please contact [facility] for night-coverage after hours (5p -7a ) and weekends on [email or web address]   Patient seen and examined with the above-signed Advanced Practice Provider and/or Housestaff. I personally reviewed laboratory data, imaging studies and relevant notes. I independently examined the patient and formulated the important aspects of the plan. I have edited  the note to reflect any of my changes or salient points. I have personally discussed the plan with the patient and/or family.  Diuresed well with IV lasix yesterday. Breathing better. No orthopnea or PND. Scr improved.   General:  Well appearing. No resp difficulty HEENT: normal Neck: supple. JVP 10  Carotids 2+ bilat; no bruits. No lymphadenopathy or thryomegaly appreciated. Cor: PMI nondisplaced. Regular rate & rhythm. No rubs, gallops or murmurs. Lungs: clear Abdomen: soft, nontender, nondistended. No hepatosplenomegaly. No bruits or masses. Good bowel sounds. Extremities: no cyanosis, clubbing, rash, 1+ edema Neuro: alert & orientedx3, cranial nerves grossly intact. moves all 4 extremities w/o difficulty. Affect pleasant  Volume status improving. Scr improving. Continue IV diuresis today. On midodrine for BP support so GDMT limited.   Glori Bickers, MD  8:53 AM

## 2021-08-30 NOTE — Plan of Care (Signed)
  Problem: Education: Goal: Ability to demonstrate management of disease process will improve Outcome: Progressing   Problem: Activity: Goal: Capacity to carry out activities will improve 08/30/2021 2050 by Barton Dubois, RN Outcome: Progressing 08/30/2021 2044 by Barton Dubois, RN Outcome: Progressing   Problem: Education: Goal: Knowledge of General Education information will improve Description: Including pain rating scale, medication(s)/side effects and non-pharmacologic comfort measures Outcome: Progressing   Problem: Clinical Measurements: Goal: Respiratory complications will improve 08/30/2021 2050 by Barton Dubois, RN Outcome: Progressing 08/30/2021 2044 by Barton Dubois, RN Outcome: Progressing   Problem: Activity: Goal: Risk for activity intolerance will decrease 08/30/2021 2050 by Barton Dubois, RN Outcome: Progressing 08/30/2021 2044 by Barton Dubois, RN Outcome: Progressing   Problem: Safety: Goal: Ability to remain free from injury will improve Outcome: Progressing

## 2021-08-30 NOTE — Plan of Care (Signed)
  Problem: Activity: Goal: Capacity to carry out activities will improve Outcome: Progressing   Problem: Activity: Goal: Risk for activity intolerance will decrease Outcome: Progressing   Problem: Elimination: Goal: Will not experience complications related to urinary retention Outcome: Progressing

## 2021-08-30 NOTE — Plan of Care (Signed)
  Problem: Education: Goal: Ability to demonstrate management of disease process will improve Outcome: Progressing   Problem: Activity: Goal: Capacity to carry out activities will improve Outcome: Progressing   Problem: Education: Goal: Knowledge of General Education information will improve Description: Including pain rating scale, medication(s)/side effects and non-pharmacologic comfort measures Outcome: Progressing   Problem: Clinical Measurements: Goal: Respiratory complications will improve Outcome: Progressing   Problem: Activity: Goal: Risk for activity intolerance will decrease Outcome: Progressing   Problem: Safety: Goal: Ability to remain free from injury will improve Outcome: Progressing

## 2021-08-31 DIAGNOSIS — I10 Essential (primary) hypertension: Secondary | ICD-10-CM | POA: Diagnosis not present

## 2021-08-31 DIAGNOSIS — N179 Acute kidney failure, unspecified: Secondary | ICD-10-CM | POA: Diagnosis not present

## 2021-08-31 DIAGNOSIS — I5043 Acute on chronic combined systolic (congestive) and diastolic (congestive) heart failure: Secondary | ICD-10-CM | POA: Diagnosis not present

## 2021-08-31 DIAGNOSIS — J410 Simple chronic bronchitis: Secondary | ICD-10-CM | POA: Diagnosis not present

## 2021-08-31 LAB — CBC
HCT: 39 % (ref 39.0–52.0)
Hemoglobin: 12.9 g/dL — ABNORMAL LOW (ref 13.0–17.0)
MCH: 29.2 pg (ref 26.0–34.0)
MCHC: 33.1 g/dL (ref 30.0–36.0)
MCV: 88.2 fL (ref 80.0–100.0)
Platelets: 207 10*3/uL (ref 150–400)
RBC: 4.42 MIL/uL (ref 4.22–5.81)
RDW: 17.7 % — ABNORMAL HIGH (ref 11.5–15.5)
WBC: 6.8 10*3/uL (ref 4.0–10.5)
nRBC: 0 % (ref 0.0–0.2)

## 2021-08-31 LAB — BASIC METABOLIC PANEL
Anion gap: 11 (ref 5–15)
BUN: 26 mg/dL — ABNORMAL HIGH (ref 8–23)
CO2: 22 mmol/L (ref 22–32)
Calcium: 9 mg/dL (ref 8.9–10.3)
Chloride: 103 mmol/L (ref 98–111)
Creatinine, Ser: 1.42 mg/dL — ABNORMAL HIGH (ref 0.61–1.24)
GFR, Estimated: 53 mL/min — ABNORMAL LOW (ref >60)
Glucose, Bld: 176 mg/dL — ABNORMAL HIGH (ref 70–99)
Potassium: 3.6 mmol/L (ref 3.5–5.1)
Sodium: 136 mmol/L (ref 135–145)

## 2021-08-31 LAB — MAGNESIUM: Magnesium: 2 mg/dL (ref 1.7–2.4)

## 2021-08-31 MED ORDER — FUROSEMIDE 10 MG/ML IJ SOLN
80.0000 mg | Freq: Once | INTRAMUSCULAR | Status: AC
  Administered 2021-08-31: 80 mg via INTRAVENOUS
  Filled 2021-08-31: qty 8

## 2021-08-31 MED ORDER — TORSEMIDE 20 MG PO TABS
60.0000 mg | ORAL_TABLET | Freq: Every day | ORAL | Status: DC
  Administered 2021-09-01: 60 mg via ORAL
  Filled 2021-08-31: qty 3

## 2021-08-31 MED ORDER — POTASSIUM CHLORIDE CRYS ER 20 MEQ PO TBCR
40.0000 meq | EXTENDED_RELEASE_TABLET | Freq: Four times a day (QID) | ORAL | Status: AC
  Administered 2021-08-31 (×2): 40 meq via ORAL
  Filled 2021-08-31 (×2): qty 2

## 2021-08-31 NOTE — Progress Notes (Addendum)
Advanced Heart Failure Rounding Note  PCP-Cardiologist: Carlyle Dolly, MD  HF: Dr. Haroldine Laws  Subjective:    Continues to diurese well with IV lasix.  Dyspnea improving. Has been walking laps around the unit.  Scr stable 1.4.     Objective:   Weight Range: 74.7 kg Body mass index is 22.96 kg/m.   Vital Signs:   Temp:  [97.5 F (36.4 C)-97.9 F (36.6 C)] 97.9 F (36.6 C) (06/21 0808) Pulse Rate:  [65-74] 74 (06/21 0808) Resp:  [15-22] 22 (06/21 0808) BP: (90-108)/(55-75) 108/75 (06/21 0808) SpO2:  [91 %-100 %] 100 % (06/21 0808) Weight:  [74.7 kg] 74.7 kg (06/21 0427) Last BM Date : 08/30/21  Weight change: Filed Weights   08/27/21 0355 08/29/21 0506 08/31/21 0427  Weight: 73.3 kg 78 kg 74.7 kg    Intake/Output:   Intake/Output Summary (Last 24 hours) at 08/31/2021 0859 Last data filed at 08/31/2021 0809 Gross per 24 hour  Intake 1113 ml  Output 3150 ml  Net -2037 ml      Physical Exam    General:  Sitting up on side of bed eating breakfast. HEENT: normal Neck: supple. Jvp ~ 8 cm. Carotids 2+ bilat; no bruits.  Cor: PMI nondisplaced. Regular rate & rhythm. No rubs, gallops or murmurs. Lungs: clear Abdomen: soft, nontender, nondistended. Extremities: no cyanosis, clubbing, rash, edema Neuro: alert & orientedx3, cranial nerves grossly intact. moves all 4 extremities w/o difficulty. Affect pleasant    Telemetry   SR 70s-80s  Labs    CBC Recent Labs    08/31/21 0241  WBC 6.8  HGB 12.9*  HCT 39.0  MCV 88.2  PLT 532   Basic Metabolic Panel Recent Labs    08/30/21 0410 08/31/21 0241  NA 136 136  K 4.2 3.6  CL 104 103  CO2 21* 22  GLUCOSE 173* 176*  BUN 25* 26*  CREATININE 1.40* 1.42*  CALCIUM 8.9 9.0  MG  --  2.0   Liver Function Tests No results for input(s): "AST", "ALT", "ALKPHOS", "BILITOT", "PROT", "ALBUMIN" in the last 72 hours. No results for input(s): "LIPASE", "AMYLASE" in the last 72 hours. Cardiac Enzymes No  results for input(s): "CKTOTAL", "CKMB", "CKMBINDEX", "TROPONINI" in the last 72 hours.  BNP: BNP (last 3 results) Recent Labs    08/03/21 1001 08/22/21 0954 08/25/21 1222  BNP 1,334.1* 1,892.6* 1,922.3*    ProBNP (last 3 results) No results for input(s): "PROBNP" in the last 8760 hours.   D-Dimer No results for input(s): "DDIMER" in the last 72 hours. Hemoglobin A1C No results for input(s): "HGBA1C" in the last 72 hours. Fasting Lipid Panel No results for input(s): "CHOL", "HDL", "LDLCALC", "TRIG", "CHOLHDL", "LDLDIRECT" in the last 72 hours. Thyroid Function Tests No results for input(s): "TSH", "T4TOTAL", "T3FREE", "THYROIDAB" in the last 72 hours.  Invalid input(s): "FREET3"  Other results:   Imaging    No results found.   Medications:     Scheduled Medications:  dorzolamide-timolol  1 drop Both Eyes BID   enoxaparin (LOVENOX) injection  40 mg Subcutaneous Q24H   ferrous sulfate  325 mg Oral QODAY   gabapentin  600 mg Oral BID   ivabradine  5 mg Oral BID WC   latanoprost  1 drop Both Eyes QHS   lipase/protease/amylase  36,000 Units Oral TID WC   midodrine  10 mg Oral TID WC   Netarsudil Dimesylate  1 drop Both Eyes QHS   pantoprazole  40 mg Oral QHS  potassium chloride  40 mEq Oral Q6H   rosuvastatin  20 mg Oral Daily   sodium chloride flush  3 mL Intravenous Q12H    Infusions:   PRN Medications: acetaminophen **OR** acetaminophen, albuterol, ALPRAZolam, gabapentin, polyethylene glycol, traMADol    Patient Profile   71 y.o. male with history of chronic systolic CHF, chronic CP, orthostatic hypotension, CKD IIIa, hx polysubstance abuse, noncompliance with medical therapy. Admitted with a/c CHF.  Assessment/Plan   Acute on Chronic Systolic Heart Failure. - Echo (9/17): EF 20-25%, Grade 1 DD.  - Echo (9/18): EF 10-15%.  - Echo (8/20): EF 10-15% - Had R/LHC (11/18): with normal coronaries and normal hemodynamics.  - Echo (11/22) ([facility])  EF 15-20% mild MR. RV ok  - Echo (5/23) EF < 20%, RV severely reduced, RVSP 46 mmHg, moderate MR - Multiple recent admissions with a/c CHF. Recently failed outpatient escalation in diuretics and not taking correctly. - Good diuresis with IV lasix. Will give one more dose 80 IV lasix today then transition to Torsemide 60 daily (taking furosemide 40 BID prior to admit). Will try to keep regimen as simple as possible with compliance issues. - Continue Ivabradine 5 mg BID - Metoprolol held on admit d/t orthostatic hypotension - Has been off entresto and spiro with low BP - Continue midodrine 10 TID - Cooks with seasonings containing salt. Discussed limiting sodium intake. - No currently candidate for advanced therapies or ICD at this point   2. Chest Pain   - Normal cors 01/2017  - HS troponin minimally elevated 19>18 - No evidence ACS. Minimal troponin elevation likely 2/2 demand ischemia   3. CKD stage IIIa - Creatinine baseline 1.1-1.4.  - Scr trending up this admit, 1.1>1.2>1.3>1.57>1.40>1.42. Renal function now improving with diuresis.  - Follow closely   4. Orthostatic hypotension - Continue midodrine 10 TID - Continue abdominal binder and TED hose   5. ETOH abuse - Sober x 8 months. - Congratulated.   6. Tobacco use - Cut back, now smoking 1-3 cigarettes a day - Cessation discussed   7. Chronic ETOH pancreatitis - Has quit ETOH - Now s/p biliary stent  - Follows with [facility]   Give one more dose IV lasix today then start po Torsemide 60 mg daily tomorrow. Anticipate may be able to go home tomorrow.   Length of Stay: 6  FINCH, [place], PA-C  08/31/2021, 8:59 AM  Advanced Heart Failure Team Pager [phone] (M-F; 7a - 5p)  Please contact [facility] for night-coverage after hours (5p -7a ) and weekends on [email or web address]   Patient seen and examined with the above-signed Advanced Practice Provider and/or Housestaff. I personally reviewed laboratory data, imaging  studies and relevant notes. I independently examined the patient and formulated the important aspects of the plan. I have edited the note to reflect any of my changes or salient points. I have personally discussed the plan with the patient and/or family.  Diuresing well with IV lasix. Renal function stable. Scr stable.   General:  Well appearing. No resp difficulty HEENT: normal Neck: supple. JVP 8. Carotids 2+ bilat; no bruits. No lymphadenopathy or thryomegaly appreciated. Cor: PMI nondisplaced. Regular rate & rhythm. No rubs, gallops or murmurs. Lungs: clear Abdomen: soft, nontender, nondistended. No hepatosplenomegaly. No bruits or masses. Good bowel sounds. Extremities: no cyanosis, clubbing, rash, trace edema Neuro: alert & orientedx3, cranial nerves grossly intact. moves all 4 extremities w/o difficulty. Affect pleasant  Agree with one more day of IV lasix. Watch renal  function. Continue midodrine for BP support.   Glori Bickers, MD  3:14 PM

## 2021-08-31 NOTE — Progress Notes (Signed)
Mobility Specialist Progress Note:   08/31/21 1034  Mobility  Activity Ambulated with assistance in hallway  Level of [place]  Distance Ambulated (ft) 1650 ft  Activity Response Tolerated well  $Mobility charge 1 Mobility   Pt received in hallway after completing one lap on the unit. No complaints of pain. Left in bed with call bell in reach and all needs met.   [facility] Sahithi Ordoyne Mobility Specialist

## 2021-08-31 NOTE — Progress Notes (Signed)
  Progress Note   Patient: Ryan [name] MLY:[id number] DOB: 01-Sep-1950 DOA: 08/25/2021     6 DOS: the patient was seen and examined on 08/31/2021   Brief hospital course: 71 year old man presented with dyspnea and edema.  Admitted for decompensated heart failure.  Assessment and Plan: * Acute on chronic combined systolic and diastolic heart failure (HCC) --Echocardiogram from 05/19 with reduced LV systolic function less than 20% --appears euvolemic; continue Rx per AHF. Possibly home tomorrow.  AKI superimposed on CKD stage IIIa --baseline ~1.08; secondary to diuresis; stable --monitor; check BMP in AM  Essential hypertension --stable; continue midodrine   Glaucoma --Chronically on netarsudil dimesylate --medication not available in house   COPD (chronic obstructive pulmonary disease) ([place]) --appears stable -Continue bronchodilator management.  Anemia of chronic disease   --borderline --ron deficiency, continue with oral iron supplementation.   Orthostatic hypotension --resolved      Subjective:  Feels better Breathing better today Ambulating in hallway to "get wind back" Eating ok Chronic abd pain  Physical Exam: Vitals:   08/30/21 2000 08/31/21 0427 08/31/21 0808 08/31/21 0900  BP: (!) 91/55 90/66 108/75   Pulse: 69 65 74   Resp: 15 20 (!) 22 18  Temp: 97.9 F (36.6 C) (!) 97.5 F (36.4 C) 97.9 F (36.6 C)   TempSrc: Oral Oral Oral   SpO2: 91% 98% 100%   Weight:  74.7 kg    Height:       Physical Exam Vitals reviewed.  Constitutional:      General: He is not in acute distress.    Appearance: He is not ill-appearing or toxic-appearing.  Cardiovascular:     Rate and Rhythm: Normal rate and regular rhythm.     Heart sounds: No murmur heard. Pulmonary:     Effort: Pulmonary effort is normal. No respiratory distress.     Breath sounds: No wheezing, rhonchi or rales.  Musculoskeletal:     Right lower leg: No edema.     Left lower leg: No edema.   Neurological:     Mental Status: He is alert.  Psychiatric:        Mood and Affect: Mood normal.        Behavior: Behavior normal.     Data Reviewed:  UOP 3150 Creatinine stable 1.42 CBC stable  Family Communication: none  Disposition: Status is: Inpatient Remains inpatient appropriate because: acute CHF on IV diuresis  Planned Discharge Destination: Home    Time spent: 35 minutes  Author: Murray Hodgkins, MD 08/31/2021 6:37 PM  For on call review [email or web address].

## 2021-09-01 ENCOUNTER — Encounter (HOSPITAL_COMMUNITY): Payer: Self-pay

## 2021-09-01 ENCOUNTER — Other Ambulatory Visit (HOSPITAL_COMMUNITY): Payer: Self-pay

## 2021-09-01 DIAGNOSIS — I5043 Acute on chronic combined systolic (congestive) and diastolic (congestive) heart failure: Secondary | ICD-10-CM | POA: Diagnosis not present

## 2021-09-01 DIAGNOSIS — J410 Simple chronic bronchitis: Secondary | ICD-10-CM | POA: Diagnosis not present

## 2021-09-01 DIAGNOSIS — N179 Acute kidney failure, unspecified: Secondary | ICD-10-CM | POA: Diagnosis not present

## 2021-09-01 DIAGNOSIS — I10 Essential (primary) hypertension: Secondary | ICD-10-CM | POA: Diagnosis not present

## 2021-09-01 LAB — BASIC METABOLIC PANEL
Anion gap: 8 (ref 5–15)
BUN: 24 mg/dL — ABNORMAL HIGH (ref 8–23)
CO2: 21 mmol/L — ABNORMAL LOW (ref 22–32)
Calcium: 9 mg/dL (ref 8.9–10.3)
Chloride: 106 mmol/L (ref 98–111)
Creatinine, Ser: 1.58 mg/dL — ABNORMAL HIGH (ref 0.61–1.24)
GFR, Estimated: 46 mL/min — ABNORMAL LOW (ref >60)
Glucose, Bld: 132 mg/dL — ABNORMAL HIGH (ref 70–99)
Potassium: 4.1 mmol/L (ref 3.5–5.1)
Sodium: 135 mmol/L (ref 135–145)

## 2021-09-01 MED ORDER — MIDODRINE HCL 5 MG PO TABS: 10.0000 mg | ORAL_TABLET | Freq: Three times a day (TID) | ORAL | 1 refills | Status: DC

## 2021-09-01 MED ORDER — MIDODRINE HCL 5 MG PO TABS
10.0000 mg | ORAL_TABLET | Freq: Three times a day (TID) | ORAL | 1 refills | Status: DC
  Filled 2021-09-01: qty 80, 14d supply, fill #0
  Filled 2021-09-01: qty 180, 30d supply, fill #0

## 2021-09-01 MED ORDER — TORSEMIDE 20 MG PO TABS
60.0000 mg | ORAL_TABLET | Freq: Every day | ORAL | 2 refills | Status: DC
  Filled 2021-09-01: qty 90, 30d supply, fill #0
  Filled 2021-09-01: qty 42, 14d supply, fill #0

## 2021-09-01 MED ORDER — TORSEMIDE 20 MG PO TABS: 60.0000 mg | ORAL_TABLET | Freq: Every day | ORAL | 2 refills | Status: DC

## 2021-09-01 NOTE — Progress Notes (Signed)
ReDs 38%. Transition to PO torsemide today, 60 mg.

## 2021-09-01 NOTE — Progress Notes (Unsigned)
ReDS Vest / Clip - 09/01/21 0900       ReDS Vest / Clip   Station Marker C    Ruler Value 33.5    ReDS Value Range Moderate volume overload    ReDS Actual Value 38

## 2021-09-05 ENCOUNTER — Encounter (HOSPITAL_COMMUNITY): Payer: No Typology Code available for payment source

## 2021-09-08 ENCOUNTER — Other Ambulatory Visit: Payer: Self-pay

## 2021-09-08 ENCOUNTER — Inpatient Hospital Stay (HOSPITAL_COMMUNITY)
Admission: EM | Admit: 2021-09-08 | Discharge: 2021-09-14 | DRG: 439 | Disposition: A | Discharge status: Home / Self Care | Payer: No Typology Code available for payment source | Attending: Internal Medicine | Admitting: Internal Medicine

## 2021-09-08 ENCOUNTER — Encounter (HOSPITAL_COMMUNITY): Payer: Self-pay

## 2021-09-08 ENCOUNTER — Emergency Department (HOSPITAL_COMMUNITY): Payer: No Typology Code available for payment source

## 2021-09-08 DIAGNOSIS — Z806 Family history of leukemia: Secondary | ICD-10-CM

## 2021-09-08 DIAGNOSIS — I5042 Chronic combined systolic (congestive) and diastolic (congestive) heart failure: Secondary | ICD-10-CM | POA: Diagnosis present

## 2021-09-08 DIAGNOSIS — Z8249 Family history of ischemic heart disease and other diseases of the circulatory system: Secondary | ICD-10-CM

## 2021-09-08 DIAGNOSIS — R1010 Upper abdominal pain, unspecified: Secondary | ICD-10-CM | POA: Diagnosis not present

## 2021-09-08 DIAGNOSIS — K219 Gastro-esophageal reflux disease without esophagitis: Secondary | ICD-10-CM | POA: Diagnosis present

## 2021-09-08 DIAGNOSIS — Z823 Family history of stroke: Secondary | ICD-10-CM

## 2021-09-08 DIAGNOSIS — K859 Acute pancreatitis without necrosis or infection, unspecified: Secondary | ICD-10-CM | POA: Diagnosis not present

## 2021-09-08 DIAGNOSIS — R109 Unspecified abdominal pain: Secondary | ICD-10-CM | POA: Diagnosis present

## 2021-09-08 DIAGNOSIS — Z833 Family history of diabetes mellitus: Secondary | ICD-10-CM

## 2021-09-08 DIAGNOSIS — J410 Simple chronic bronchitis: Secondary | ICD-10-CM | POA: Diagnosis not present

## 2021-09-08 DIAGNOSIS — F17200 Nicotine dependence, unspecified, uncomplicated: Secondary | ICD-10-CM | POA: Diagnosis present

## 2021-09-08 DIAGNOSIS — I951 Orthostatic hypotension: Secondary | ICD-10-CM | POA: Diagnosis present

## 2021-09-08 DIAGNOSIS — K8689 Other specified diseases of pancreas: Secondary | ICD-10-CM | POA: Diagnosis present

## 2021-09-08 DIAGNOSIS — N1831 Chronic kidney disease, stage 3a: Secondary | ICD-10-CM | POA: Diagnosis present

## 2021-09-08 DIAGNOSIS — K861 Other chronic pancreatitis: Secondary | ICD-10-CM | POA: Diagnosis not present

## 2021-09-08 DIAGNOSIS — I10 Essential (primary) hypertension: Secondary | ICD-10-CM | POA: Diagnosis present

## 2021-09-08 DIAGNOSIS — I428 Other cardiomyopathies: Secondary | ICD-10-CM | POA: Diagnosis present

## 2021-09-08 DIAGNOSIS — J449 Chronic obstructive pulmonary disease, unspecified: Secondary | ICD-10-CM | POA: Diagnosis present

## 2021-09-08 DIAGNOSIS — F32A Depression, unspecified: Secondary | ICD-10-CM | POA: Diagnosis present

## 2021-09-08 DIAGNOSIS — I130 Hypertensive heart and chronic kidney disease with heart failure and stage 1 through stage 4 chronic kidney disease, or unspecified chronic kidney disease: Secondary | ICD-10-CM | POA: Diagnosis present

## 2021-09-08 DIAGNOSIS — K2980 Duodenitis without bleeding: Secondary | ICD-10-CM | POA: Diagnosis present

## 2021-09-08 DIAGNOSIS — Z79899 Other long term (current) drug therapy: Secondary | ICD-10-CM

## 2021-09-08 DIAGNOSIS — I5022 Chronic systolic (congestive) heart failure: Secondary | ICD-10-CM | POA: Diagnosis present

## 2021-09-08 DIAGNOSIS — F17210 Nicotine dependence, cigarettes, uncomplicated: Secondary | ICD-10-CM | POA: Diagnosis present

## 2021-09-08 LAB — COMPREHENSIVE METABOLIC PANEL
ALT: 29 U/L (ref 0–44)
AST: 30 U/L (ref 15–41)
Albumin: 3.6 g/dL (ref 3.5–5.0)
Alkaline Phosphatase: 128 U/L — ABNORMAL HIGH (ref 38–126)
Anion gap: 6 (ref 5–15)
BUN: 14 mg/dL (ref 8–23)
CO2: 29 mmol/L (ref 22–32)
Calcium: 9.9 mg/dL (ref 8.9–10.3)
Chloride: 101 mmol/L (ref 98–111)
Creatinine, Ser: 1.04 mg/dL (ref 0.61–1.24)
GFR, Estimated: >60 mL/min (ref >60)
Glucose, Bld: 114 mg/dL — ABNORMAL HIGH (ref 70–99)
Potassium: 3.6 mmol/L (ref 3.5–5.1)
Sodium: 136 mmol/L (ref 135–145)
Total Bilirubin: 0.9 mg/dL (ref 0.3–1.2)
Total Protein: 7.1 g/dL (ref 6.5–8.1)

## 2021-09-08 LAB — CBC WITH DIFFERENTIAL/PLATELET
Abs Immature Granulocytes: 0.02 10*3/uL (ref 0.00–0.07)
Basophils Absolute: 0 10*3/uL (ref 0.0–0.1)
Basophils Relative: 0 %
Eosinophils Absolute: 0.3 10*3/uL (ref 0.0–0.5)
Eosinophils Relative: 4 %
HCT: 47.8 % (ref 39.0–52.0)
Hemoglobin: 15.4 g/dL (ref 13.0–17.0)
Immature Granulocytes: 0 %
Lymphocytes Relative: 15 %
Lymphs Abs: 1.3 10*3/uL (ref 0.7–4.0)
MCH: 28.3 pg (ref 26.0–34.0)
MCHC: 32.2 g/dL (ref 30.0–36.0)
MCV: 87.7 fL (ref 80.0–100.0)
Monocytes Absolute: 0.6 10*3/uL (ref 0.1–1.0)
Monocytes Relative: 7 %
Neutro Abs: 6.7 10*3/uL (ref 1.7–7.7)
Neutrophils Relative %: 74 %
Platelets: 263 10*3/uL (ref 150–400)
RBC: 5.45 MIL/uL (ref 4.22–5.81)
RDW: 18.9 % — ABNORMAL HIGH (ref 11.5–15.5)
WBC: 9 10*3/uL (ref 4.0–10.5)
nRBC: 0 % (ref 0.0–0.2)

## 2021-09-08 LAB — ETHANOL: Alcohol, Ethyl (B): <10 mg/dL (ref <10)

## 2021-09-08 LAB — LIPASE, BLOOD: Lipase: 157 U/L — ABNORMAL HIGH (ref 11–51)

## 2021-09-08 LAB — TROPONIN I (HIGH SENSITIVITY)
Troponin I (High Sensitivity): 11 ng/L (ref <18)
Troponin I (High Sensitivity): 11 ng/L (ref <18)

## 2021-09-08 MED ORDER — DORZOLAMIDE HCL-TIMOLOL MAL 2-0.5 % OP SOLN
1.0000 [drp] | Freq: Two times a day (BID) | OPHTHALMIC | Status: DC
  Administered 2021-09-08 – 2021-09-14 (×12): 1 [drp] via OPHTHALMIC
  Filled 2021-09-08: qty 10

## 2021-09-08 MED ORDER — LATANOPROST 0.005 % OP SOLN
1.0000 [drp] | Freq: Every day | OPHTHALMIC | Status: DC
  Administered 2021-09-08 – 2021-09-13 (×6): 1 [drp] via OPHTHALMIC
  Filled 2021-09-08: qty 2.5

## 2021-09-08 MED ORDER — IVABRADINE HCL 5 MG PO TABS
5.0000 mg | ORAL_TABLET | Freq: Two times a day (BID) | ORAL | Status: DC
  Administered 2021-09-09 – 2021-09-10 (×3): 5 mg via ORAL
  Filled 2021-09-08 (×7): qty 1

## 2021-09-08 MED ORDER — ENOXAPARIN SODIUM 40 MG/0.4ML IJ SOSY
40.0000 mg | PREFILLED_SYRINGE | INTRAMUSCULAR | Status: DC
  Filled 2021-09-08: qty 0.4

## 2021-09-08 MED ORDER — ASPIRIN 81 MG PO CHEW
324.0000 mg | CHEWABLE_TABLET | Freq: Once | ORAL | Status: AC
  Administered 2021-09-08: 324 mg via ORAL
  Filled 2021-09-08: qty 4

## 2021-09-08 MED ORDER — PANCRELIPASE (LIP-PROT-AMYL) 12000-38000 UNITS PO CPEP
36000.0000 [IU] | ORAL_CAPSULE | Freq: Three times a day (TID) | ORAL | Status: DC
  Administered 2021-09-09 – 2021-09-14 (×16): 36000 [IU] via ORAL
  Filled 2021-09-08 (×16): qty 3

## 2021-09-08 MED ORDER — ACETAMINOPHEN 650 MG RE SUPP: 650.0000 mg | Freq: Four times a day (QID) | RECTAL | Status: DC | PRN

## 2021-09-08 MED ORDER — HEPARIN SODIUM (PORCINE) 5000 UNIT/ML IJ SOLN
5000.0000 [IU] | Freq: Three times a day (TID) | INTRAMUSCULAR | Status: DC
  Administered 2021-09-12 – 2021-09-13 (×4): 5000 [IU] via SUBCUTANEOUS
  Filled 2021-09-08 (×11): qty 1

## 2021-09-08 MED ORDER — MIDODRINE HCL 5 MG PO TABS
10.0000 mg | ORAL_TABLET | Freq: Three times a day (TID) | ORAL | Status: DC
  Administered 2021-09-09 – 2021-09-14 (×15): 10 mg via ORAL
  Filled 2021-09-08 (×15): qty 2

## 2021-09-08 MED ORDER — ALBUTEROL SULFATE (2.5 MG/3ML) 0.083% IN NEBU: 2.5000 mg | INHALATION_SOLUTION | Freq: Four times a day (QID) | RESPIRATORY_TRACT | Status: DC | PRN

## 2021-09-08 MED ORDER — MORPHINE SULFATE (PF) 4 MG/ML IV SOLN
4.0000 mg | Freq: Once | INTRAVENOUS | Status: AC
  Administered 2021-09-08: 4 mg via INTRAVENOUS
  Filled 2021-09-08: qty 1

## 2021-09-08 MED ORDER — GABAPENTIN 300 MG PO CAPS: 600.0000 mg | ORAL_CAPSULE | Freq: Every day | ORAL | Status: DC | PRN

## 2021-09-08 MED ORDER — HYDROMORPHONE HCL 1 MG/ML IJ SOLN
1.0000 mg | INTRAMUSCULAR | Status: DC | PRN
  Administered 2021-09-08 – 2021-09-13 (×13): 1 mg via INTRAVENOUS
  Filled 2021-09-08 (×13): qty 1

## 2021-09-08 MED ORDER — PANTOPRAZOLE SODIUM 40 MG IV SOLR
40.0000 mg | Freq: Every day | INTRAVENOUS | Status: DC
  Administered 2021-09-08 – 2021-09-13 (×6): 40 mg via INTRAVENOUS
  Filled 2021-09-08 (×6): qty 10

## 2021-09-08 MED ORDER — POTASSIUM CHLORIDE IN NACL 20-0.9 MEQ/L-% IV SOLN: INTRAVENOUS | Status: AC

## 2021-09-08 MED ORDER — ONDANSETRON HCL 4 MG/2ML IJ SOLN
4.0000 mg | Freq: Once | INTRAMUSCULAR | Status: AC
  Administered 2021-09-08: 4 mg via INTRAVENOUS
  Filled 2021-09-08: qty 2

## 2021-09-08 MED ORDER — ENSURE ENLIVE PO LIQD
237.0000 mL | Freq: Two times a day (BID) | ORAL | Status: DC
Start: 2021-09-09 — End: 2021-09-14
  Administered 2021-09-13 – 2021-09-14 (×3): 237 mL via ORAL

## 2021-09-08 MED ORDER — ACETAMINOPHEN 325 MG PO TABS
650.0000 mg | ORAL_TABLET | Freq: Four times a day (QID) | ORAL | Status: DC | PRN
  Administered 2021-09-11 – 2021-09-13 (×2): 650 mg via ORAL
  Filled 2021-09-08 (×2): qty 2

## 2021-09-08 MED ORDER — ENSURE ENLIVE PO LIQD: 237.0000 mL | Freq: Two times a day (BID) | ORAL | Status: DC

## 2021-09-08 MED ORDER — POLYETHYLENE GLYCOL 3350 17 G PO PACK: 17.0000 g | PACK | Freq: Every day | ORAL | Status: DC | PRN

## 2021-09-08 MED ORDER — GABAPENTIN 300 MG PO CAPS
600.0000 mg | ORAL_CAPSULE | Freq: Two times a day (BID) | ORAL | Status: DC
  Administered 2021-09-08 – 2021-09-14 (×12): 600 mg via ORAL
  Filled 2021-09-08 (×12): qty 2

## 2021-09-08 MED ORDER — IOHEXOL 300 MG/ML  SOLN
100.0000 mL | Freq: Once | INTRAMUSCULAR | Status: AC | PRN
  Administered 2021-09-08: 100 mL via INTRAVENOUS

## 2021-09-08 NOTE — Assessment & Plan Note (Addendum)
Stable on RA -Resume home bronchodilator regimen

## 2021-09-08 NOTE — Assessment & Plan Note (Addendum)
CT/CT shows diffuse dilatation of the main pancreatic duct.  Consideration for pancreatic mass in the head of the pancreas.  Also possible duodenitis or infiltrative changes.   - Per care everywhere 04/2021-at [facility] clinic, per general surgery notes.  A follow-up MRI Abdomen with, without contrast completed on November 29, 2020 confirmed CBD dilation to 8 mm and common hepatic artery dilation to 1.5 cm. The main pancreas duct was dilated to the pancreas head with atrophy of the body and tail. An EUS and ERCP were completed on January 30, 2021. A significant amount of retained gastric contents limited the procedure. Groove pancreatitis was noted. A CT of the Abdomen completed on March 01, 2021 did reveal narrowing of the portal vein confluence along with peri-pancreatic stranding. Concerns for an impending gastric outlet obstruction was noted. An endoscopic gastroduodenostomy with an axios stent was therefore completed for a duodenal stricture. His most recent CT of the Abdomen completed on April 21, 2021 revealed ongoing inflammation within the pancreas head. Although no obvious mass is seen on this scan, significant portal vein narrowing at the confluence is still noted.   they felt the imaging findings are concerning for an occult malignancy of the pancreas and but Dr. Marrion Coy was not certain whtether an attempt at an EUS to blindly biopsy the parenchymal tissus is possbie if a mass is not evident. He obtain CA 19-9 tumor marker and stated that if diagnosed with pancreatic malignancy that he felt as though successful operative intervention would be unlikely given his cardiac history with a EF of 15 to 20% along with ominous venous compromise on imaging.  He advised possible palliative radiation with systemic treatment if diagnosis confirmed    --GI following --out pt follow up with his primary GI in [place], [place] for possible EUS

## 2021-09-08 NOTE — ED Triage Notes (Signed)
Chest pain radiating down left arm, abdominal pain. Recently dc with pancreatitis. Dizziness and chronic SOB

## 2021-09-08 NOTE — Assessment & Plan Note (Addendum)
Stable and compensated.  Recent prolonged hospitalization for same.  -Hold torsemide 60 mg daily for now as pt was hypotensive with dizziness 7/1 and very little po intake -Gentle hydration initially>>saline lock as pt now tolerates diet - Echo (5/23) EF < 20%, G3DD RV severely reduced, RVSP 46 mmHg, moderate MR - Has been off entresto and spiro with low BP Clinically euvolemic at time of d/c>>restart home dose torsemide 60 mg daily

## 2021-09-08 NOTE — Assessment & Plan Note (Signed)
Systolic blood pressure ranging from 140-120.  On torsemide otherwise not on antihypertensives. -He is on midodrine, will resume -Hold torsemide for now with n.p.o status.

## 2021-09-08 NOTE — ED Provider Notes (Signed)
[facility] EMERGENCY DEPARTMENT Provider Note   CSN: [id number] Arrival date & time: 09/08/21  6195     History  Chief Complaint  Patient presents with   Chest Pain    Chest pain radiating down left arm    Ryan [name] is a 71 y.o. male with a history including hypertension, COPD, CHF, chronic kidney disease history of cardiomyopathy, history of alcohol abuse (no etoh intake x 1 year) with subsequent episodic pancreatitis, describes having a known pancreatic cyst and states at a work-up months ago at [place] in [place] he was told he may have a mass on his pancreas and was recommended that he have surgery but he declined this treatment, presents today with chest pain which radiates into his left arm into his upper abdomen which started yesterday.  He reports nausea with vomiting triggered by attempts to eat, no diaphoresis or fever. He does feel weak and dizzy, denies any increased shortness of breath but has shortness of breath at baseline.  He has found no alleviators for symptoms.  No medications prior to arrival.  He was discharged from this hospital 1 week ago at which time he was treated for acute on chronic CHF.      The history is provided by the patient.       Home Medications Prior to Admission medications   Medication Sig Start Date End Date Taking? Authorizing Provider  albuterol (PROVENTIL) (2.5 MG/3ML) 0.083% nebulizer solution Take 2.5 mg by nebulization every 6 (six) hours as needed for wheezing or shortness of breath.    [provider]  albuterol (VENTOLIN HFA) 108 (90 Base) MCG/ACT inhaler Inhale 2 puffs into the lungs every 6 (six) hours as needed for wheezing or shortness of breath.    [provider]  camphor-menthol Timoteo Ace) lotion Apply 1 Application topically daily as needed for itching.    [provider]  DOCUSATE SODIUM PO Take 2 capsules by mouth daily as needed for constipation.    [provider]   dorzolamide-timolol (COSOPT) 22.3-6.8 MG/ML ophthalmic solution Place 1 drop into both eyes 2 (two) times daily.    [provider]  ferrous sulfate 325 (65 FE) MG tablet Take 325 mg by mouth 2 (two) times daily with a meal.    [provider]  gabapentin (NEURONTIN) 300 MG capsule Take 300 mg by mouth See admin instructions. Take two capsules (600 mg) by mouth twice daily, may take a 3rd dose (600 mg) midday as needed for pain    [provider]  ivabradine (CORLANOR) 5 MG TABS tablet Take 1 tablet (5 mg total) by mouth 2 (two) times daily with a meal. 08/04/21   [facility], Tami Lin, PA  latanoprost (XALATAN) 0.005 % ophthalmic solution Place 1 drop into both eyes at bedtime.    [provider]  lipase/protease/amylase (CREON) 12000-38000 units CPEP capsule Take 12,000 Units by mouth 3 (three) times daily with meals. Take with a 24000 unit capsule    [provider]  loratadine (CLARITIN) 10 MG tablet Take 10 mg by mouth at bedtime.    [provider]  midodrine (PROAMATINE) 5 MG tablet Take 2 tablets (10 mg total) by mouth 3 (three) times daily with meals. 09/01/21   Samuella Cota, MD  Netarsudil Dimesylate 0.02 % SOLN Place 1 drop into both eyes at bedtime.    [provider]  pantoprazole (PROTONIX) 40 MG tablet Take 1 tablet (40 mg total) by mouth daily. Patient taking differently:  Take 40 mg by mouth at bedtime. 10/08/19   Nuala Alpha A, PA-C  potassium chloride (KLOR-CON) 10 MEQ tablet Take 1 tablet (10 mEq total) by mouth daily. 08/04/21   [facility], Tami Lin, PA  rosuvastatin (CRESTOR) 20 MG tablet Take 20 mg by mouth daily.    [provider]  torsemide (DEMADEX) 20 MG tablet Take 3 tablets (60 mg total) by mouth daily. 09/02/21   Samuella Cota, MD  traMADol (ULTRAM) 50 MG tablet Take 100 mg by mouth in the morning, at noon, and at bedtime.    [provider]      Allergies    Tape    Review of  Systems   Review of Systems  Constitutional:  Positive for appetite change. Negative for chills and fever.  HENT:  Negative for congestion and sore throat.   Eyes: Negative.   Respiratory:  Negative for chest tightness and shortness of breath.   Cardiovascular:  Positive for chest pain.  Gastrointestinal:  Positive for abdominal pain, nausea and vomiting.  Genitourinary: Negative.   Musculoskeletal:  Negative for arthralgias, joint swelling and neck pain.  Skin: Negative.  Negative for rash and wound.  Neurological:  Negative for dizziness, weakness, light-headedness, numbness and headaches.  Psychiatric/Behavioral: Negative.      Physical Exam Updated Vital Signs BP 104/76   Pulse 99   Temp 98.1 F (36.7 C) (Oral)   Resp (!) 22   Ht '5\' 11"'$  (1.803 m)   Wt 80.3 kg   SpO2 98%   BMI 24.69 kg/m  Physical Exam Vitals and nursing note reviewed.  Constitutional:      Appearance: He is well-developed.  HENT:     Head: Normocephalic and atraumatic.  Eyes:     Conjunctiva/sclera: Conjunctivae normal.  Cardiovascular:     Rate and Rhythm: Normal rate and regular rhythm.     Heart sounds: Normal heart sounds.  Pulmonary:     Effort: Pulmonary effort is normal.     Breath sounds: Normal breath sounds. No wheezing or rhonchi.  Abdominal:     General: Bowel sounds are normal.     Palpations: Abdomen is soft.     Tenderness: There is abdominal tenderness in the left upper quadrant.  Musculoskeletal:        General: Normal range of motion.     Cervical back: Normal range of motion.  Skin:    General: Skin is warm and dry.  Neurological:     Mental Status: He is alert.     ED Results / Procedures / Treatments   Labs (all labs ordered are listed, but only abnormal results are displayed) Labs Reviewed  CBC WITH DIFFERENTIAL/PLATELET - Abnormal; Notable for the following components:      Result Value   RDW 18.9 (*)    All other components within normal limits  COMPREHENSIVE  METABOLIC PANEL - Abnormal; Notable for the following components:   Glucose, Bld 114 (*)    Alkaline Phosphatase 128 (*)    All other components within normal limits  LIPASE, BLOOD - Abnormal; Notable for the following components:   Lipase 157 (*)    All other components within normal limits  ETHANOL  TROPONIN I (HIGH SENSITIVITY)  TROPONIN I (HIGH SENSITIVITY)    EKG None  Radiology CT ABDOMEN PELVIS W CONTRAST  Result Date: 09/08/2021 CLINICAL DATA:  Left upper quadrant abdominal pain. Recent diagnosis of pancreatitis. EXAM: CT ABDOMEN AND PELVIS WITH CONTRAST TECHNIQUE: Multidetector CT imaging of the  abdomen and pelvis was performed using the standard protocol following bolus administration of intravenous contrast. RADIATION DOSE REDUCTION: This exam was performed according to the departmental dose-optimization program which includes automated exposure control, adjustment of the mA and/or kV according to patient size and/or use of iterative reconstruction technique. CONTRAST:  135m OMNIPAQUE IOHEXOL 300 MG/ML  SOLN COMPARISON:  June 28, 2021 FINDINGS: Lower chest: Minimal bibasilar pleural thickening and subpleural scarring. Enlarged heart. Hepatobiliary: No focal liver abnormality is seen. No gallstones, gallbladder wall thickening, or biliary dilatation. Pancreas: Diffuse dilation of the main pancreatic duct with overall atrophic pancreatic parenchyma. Coarse calcifications within the head of the pancreas. Spleen: Normal in size without focal abnormality. Adrenals/Urinary Tract: Left upper pole renal lesion, too small to be actually characterized by CT. No evidence of hydronephrosis or hydroureter. Nonobstructive Large 1.7 cm right lower pole renal calculus. Stomach/Bowel: Again seen is tubular device connecting the stomach with the underlying jejunal segment. Persistent indistinctness/edema of the second and third portion of the duodenum. Vascular/Lymphatic: Aortic atherosclerosis. No  enlarged abdominal or pelvic lymph nodes. Reproductive: Coarse calcifications of the prostate gland. Other: Mild mesenteric edema in the upper abdomen. Musculoskeletal: Advanced lytic and sclerotic changes of the left proximal femur. 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 IMPRESSION: 1. Diffuse dilation of the main pancreatic duct with overall atrophic pancreatic parenchyma. Coarse calcifications within the head of the pancreas. These findings may represent sequela of chronic pancreatitis. Pancreatic mass in the head of the pancreas cannot be entirely excluded. 2. Persistent indistinctness/edema of the second and third portion of the duodenum, which may be due to duodenitis or infiltrative changes. Correlation to endoscopy results is recommended. 3. Nonobstructive right renal calculus. 4. Advanced lytic and sclerotic changes of the left proximal femur, which may be due to infectious/inflammatory etiology, posttraumatic changes. 5. 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 Aortic Atherosclerosis (ICD10-I70.0). Electronically Signed   By: DFidela SalisburyM.D.   On: 09/08/2021 15:24   DG Chest Port 1 View  Result Date: 09/08/2021 CLINICAL DATA:  Chest pain EXAM: PORTABLE CHEST 1 VIEW COMPARISON:  08/26/2018 FINDINGS: Transverse diameter of heart is increased. There are no signs of pulmonary edema or focal pulmonary consolidation. Small linear densities seen in the right lower lung fields. There is no significant pleural effusion or pneumothorax. IMPRESSION: Cardiomegaly. There are no signs of pulmonary edema or focal pulmonary consolidation. Small linear densities in the right lower lung fields suggest scarring or subsegmental atelectasis. Electronically Signed   By: PElmer PickerM.D.   On: 09/08/2021 10:28    Procedures Procedures    Medications Ordered in ED Medications  aspirin chewable tablet 324 mg (324 mg Oral Given 09/08/21 1116)  ondansetron (ZOFRAN) injection 4 mg (4  mg Intravenous Given 09/08/21 1225)  morphine (PF) 4 MG/ML injection 4 mg (4 mg Intravenous Given 09/08/21 1225)  iohexol (OMNIPAQUE) 300 MG/ML solution 100 mL (100 mLs Intravenous Contrast Given 09/08/21 1451)    ED Course/ Medical Decision Making/ A&P Clinical Course as of 09/08/21 1704  Thu Sep 08, 2021  1159 Lipase(!): 157 [JI]    Clinical Course User Index [JI] IEvalee Jefferson PA-C                           Medical Decision Making Patient presenting with left chest pain but also with left upper abdominal pain along with nausea, vomiting and food intolerance for the past 2 days.  He does have  a history of chronic pancreatitis.  Recent discharge from this hospital for CHF exacerbation.  He does have an elevated lipase today at 157.  His first troponin is negative, pending delta troponin at this time.  We discussed treatment for pancreatitis including n.p.o. status except for fluids, pain and nausea medicine, discussed the reasonableness of trying this treatment at home, patient does not feel he will do well with this plan, states he is already not had anything to eat in 2 days and feels like he will get worse if he goes home.  We will get a CT scan of his abdomen to further assess his pancreas but will ultimately plan admission for supportive care while his symptoms resolved.  Amount and/or Complexity of Data Reviewed Labs: ordered. Decision-making details documented in ED Course. Radiology: ordered.    Details: Chest x-ray reviewed, no significant findings.  CT abdomen and pelvis revealing calcifications of the head of the pancreas, unable to exclude pancreatic mass.  Chronic edema of the duodenum suggesting duodenitis although this finding was present previously.  Right renal calculus.  Sclerotic changes of the left proximal femur, favoring posttraumatic changes. Discussion of management or test interpretation with external provider(s): Call placed to the hospitalist service.  Discussed with Dr.  Denton Brick who accepts pt for admission.  Risk OTC drugs. Prescription drug management.           Final Clinical Impression(s) / ED Diagnoses Final diagnoses:  Acute pancreatitis without infection or necrosis, unspecified pancreatitis type    Rx / DC Orders ED Discharge Orders     None         Landis Martins 09/08/21 1704    Milton Ferguson, MD 09/09/21 1744
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
## 2021-09-08 NOTE — Assessment & Plan Note (Addendum)
Resume Creon.  History of alcohol abuse, reports he has not drunk any alcoholic beverage in a year.

## 2021-09-08 NOTE — Assessment & Plan Note (Addendum)
History of chronic pancreatitis.  CT today suggesting duodenitis, but lipase is also elevated from baseline at 157 today.  From 82s a month ago.  Initial reports of chest pain, but symptoms appear to be GI related.  Troponin 11 x 2.  EKG without significant changes.  History of nonischemic cardiomyopathy -Bowel rest, NPO -IV Dilaudid 1 mg every 4 hours as needed - N/s + 20kcl 50cc/hr x 20hrs -Will consult GI

## 2021-09-09 DIAGNOSIS — K861 Other chronic pancreatitis: Secondary | ICD-10-CM | POA: Diagnosis present

## 2021-09-09 DIAGNOSIS — Z823 Family history of stroke: Secondary | ICD-10-CM | POA: Diagnosis not present

## 2021-09-09 DIAGNOSIS — I5042 Chronic combined systolic (congestive) and diastolic (congestive) heart failure: Secondary | ICD-10-CM | POA: Diagnosis present

## 2021-09-09 DIAGNOSIS — I951 Orthostatic hypotension: Secondary | ICD-10-CM | POA: Diagnosis present

## 2021-09-09 DIAGNOSIS — Z833 Family history of diabetes mellitus: Secondary | ICD-10-CM | POA: Diagnosis not present

## 2021-09-09 DIAGNOSIS — Z79899 Other long term (current) drug therapy: Secondary | ICD-10-CM | POA: Diagnosis not present

## 2021-09-09 DIAGNOSIS — J449 Chronic obstructive pulmonary disease, unspecified: Secondary | ICD-10-CM | POA: Diagnosis present

## 2021-09-09 DIAGNOSIS — I5022 Chronic systolic (congestive) heart failure: Secondary | ICD-10-CM

## 2021-09-09 DIAGNOSIS — Z806 Family history of leukemia: Secondary | ICD-10-CM | POA: Diagnosis not present

## 2021-09-09 DIAGNOSIS — K8689 Other specified diseases of pancreas: Secondary | ICD-10-CM | POA: Diagnosis not present

## 2021-09-09 DIAGNOSIS — R109 Unspecified abdominal pain: Secondary | ICD-10-CM | POA: Diagnosis present

## 2021-09-09 DIAGNOSIS — K859 Acute pancreatitis without necrosis or infection, unspecified: Secondary | ICD-10-CM

## 2021-09-09 DIAGNOSIS — Z8249 Family history of ischemic heart disease and other diseases of the circulatory system: Secondary | ICD-10-CM | POA: Diagnosis not present

## 2021-09-09 DIAGNOSIS — F32A Depression, unspecified: Secondary | ICD-10-CM | POA: Diagnosis present

## 2021-09-09 DIAGNOSIS — R1010 Upper abdominal pain, unspecified: Secondary | ICD-10-CM

## 2021-09-09 DIAGNOSIS — I428 Other cardiomyopathies: Secondary | ICD-10-CM | POA: Diagnosis present

## 2021-09-09 DIAGNOSIS — N1831 Chronic kidney disease, stage 3a: Secondary | ICD-10-CM | POA: Diagnosis present

## 2021-09-09 DIAGNOSIS — K219 Gastro-esophageal reflux disease without esophagitis: Secondary | ICD-10-CM | POA: Diagnosis present

## 2021-09-09 DIAGNOSIS — K2980 Duodenitis without bleeding: Secondary | ICD-10-CM | POA: Diagnosis present

## 2021-09-09 DIAGNOSIS — I130 Hypertensive heart and chronic kidney disease with heart failure and stage 1 through stage 4 chronic kidney disease, or unspecified chronic kidney disease: Secondary | ICD-10-CM | POA: Diagnosis present

## 2021-09-09 DIAGNOSIS — F17210 Nicotine dependence, cigarettes, uncomplicated: Secondary | ICD-10-CM | POA: Diagnosis present

## 2021-09-09 DIAGNOSIS — K852 Alcohol induced acute pancreatitis without necrosis or infection: Secondary | ICD-10-CM | POA: Diagnosis not present

## 2021-09-09 DIAGNOSIS — J410 Simple chronic bronchitis: Secondary | ICD-10-CM

## 2021-09-09 DIAGNOSIS — I10 Essential (primary) hypertension: Secondary | ICD-10-CM

## 2021-09-09 LAB — CBC
HCT: 41.6 % (ref 39.0–52.0)
Hemoglobin: 13.1 g/dL (ref 13.0–17.0)
MCH: 28.5 pg (ref 26.0–34.0)
MCHC: 31.5 g/dL (ref 30.0–36.0)
MCV: 90.4 fL (ref 80.0–100.0)
Platelets: 203 10*3/uL (ref 150–400)
RBC: 4.6 MIL/uL (ref 4.22–5.81)
RDW: 18.4 % — ABNORMAL HIGH (ref 11.5–15.5)
WBC: 6.7 10*3/uL (ref 4.0–10.5)
nRBC: 0 % (ref 0.0–0.2)

## 2021-09-09 LAB — BASIC METABOLIC PANEL
Anion gap: 7 (ref 5–15)
BUN: 14 mg/dL (ref 8–23)
CO2: 23 mmol/L (ref 22–32)
Calcium: 8.8 mg/dL — ABNORMAL LOW (ref 8.9–10.3)
Chloride: 106 mmol/L (ref 98–111)
Creatinine, Ser: 1.09 mg/dL (ref 0.61–1.24)
GFR, Estimated: >60 mL/min (ref >60)
Glucose, Bld: 121 mg/dL — ABNORMAL HIGH (ref 70–99)
Potassium: 4.5 mmol/L (ref 3.5–5.1)
Sodium: 136 mmol/L (ref 135–145)

## 2021-09-09 MED ORDER — POTASSIUM CHLORIDE IN NACL 20-0.9 MEQ/L-% IV SOLN: INTRAVENOUS | Status: DC

## 2021-09-09 NOTE — Progress Notes (Signed)
   09/09/21 0147  Assess: MEWS Score  Temp 97.6 F (36.4 C)  BP 96/74  MAP (mmHg) 82  Pulse Rate (!) 123  Resp 18  SpO2 98 %  Assess: MEWS Score  MEWS Temp 0  MEWS Systolic 1  MEWS Pulse 2  MEWS RR 0  MEWS LOC 0  MEWS Score 3  MEWS Score Color Yellow  Assess: if the MEWS score is Yellow or Red  Were vital signs taken at a resting state? Yes  Focused Assessment No change from prior assessment  Does the patient meet 2 or more of the SIRS criteria? No  MEWS guidelines implemented *See Row Information* Yes  Take Vital Signs  Increase Vital Sign Frequency  Yellow: Q 2hr X 2 then Q 4hr X 2, if remains yellow, continue Q 4hrs  Escalate  MEWS: Escalate Yellow: discuss with charge nurse/RN and consider discussing with provider and RRT  Notify: Charge Nurse/RN  Name of Charge Nurse/RN Notified Deeann Dowse, RN  Date Charge Nurse/RN Notified 09/09/21  Time Charge Nurse/RN Notified 0148  Notify: Provider  Provider Name/Title Dr. Josephine Cables  Date Provider Notified 09/09/21  Time Provider Notified 0200  Assess: SIRS CRITERIA  SIRS Temperature  0  SIRS Pulse 1  SIRS Respirations  0  SIRS WBC 0  SIRS Score Sum  1

## 2021-09-09 NOTE — Progress Notes (Signed)
Initial Nutrition Assessment  DOCUMENTATION CODES:      INTERVENTION:  When cleared for diet: Start Boost Breeze po TID   Multivitamin daily  NUTRITION DIAGNOSIS:   Inadequate oral intake related to inability to eat, acute illness (acute on chronic pancreatitis) as evidenced by NPO status.   GOAL:  Patient will meet greater than or equal to 90% of their needs  MONITOR:  Diet advancement, Labs  REASON FOR ASSESSMENT:   Malnutrition Screening Tool    ASSESSMENT: Patient is a 71 yo male with history of COPD, CHF, CKD-3, GERD, HTN, polysubstance abuse. Presents with abdominal pain. Acute on chronic pancreatitis and acute duodenitis.  GI consulted. Creon resumed when diet advances.   Patient is NPO except for ice chips and sips. Regular diet at home and eats at regular intervals through the day. Independent in the community.   Weight encounters: August-2022 wt 81.6 kg, May 2023 wt. 76.2 kg and current 80.3 kg.   Medications: Creon (48000 units with meals and 24000 snacks), PPI  IVF-@ 50 ml/hr     Latest Ref Rng & Units 09/09/2021    4:49 AM 09/08/2021   11:02 AM 09/01/2021    4:51 AM  BMP  Glucose 70 - 99 mg/dL 121  114  132   BUN 8 - 23 mg/dL '14  14  24   '$ Creatinine 0.61 - 1.24 mg/dL 1.09  1.04  1.58   Sodium 135 - 145 mmol/L 136  136  135   Potassium 3.5 - 5.1 mmol/L 4.5  3.6  4.1   Chloride 98 - 111 mmol/L 106  101  106   CO2 22 - 32 mmol/L '23  29  21   '$ Calcium 8.9 - 10.3 mg/dL 8.8  9.9  9.0       NUTRITION - FOCUSED PHYSICAL EXAM: Nutrition-Focused physical exam completed. Findings are mild orbital and buccal fat depletion, mild temporal muscle depletion, and no edema.     Diet Order:   Diet Order             Diet NPO time specified Except for: Ice Chips, Sips with Meds  Diet effective now                   EDUCATION NEEDS:  Education needs have been addressed  Skin:  Skin Assessment: Reviewed RN Assessment  Last BM:  6/29  Height:   Ht  Readings from Last 1 Encounters:  09/08/21 '5\' 11"'$  (1.803 m)    Weight:   Wt Readings from Last 1 Encounters:  09/08/21 80.3 kg    Ideal Body Weight:   78 kg   BMI:  Body mass index is 24.69 kg/m.  Estimated Nutritional Needs:   Kcal:  2200-2400  Protein:  96-104 gr  Fluid:  < 2 liters daily  Colman Cater MS,RD,CSG,LDN Contact: Shea Evans

## 2021-09-09 NOTE — Plan of Care (Signed)
  Problem: Education: Goal: Knowledge of General Education information will improve Description Including pain rating scale, medication(s)/side effects and non-pharmacologic comfort measures Outcome: Progressing   Problem: Health Behavior/Discharge Planning: Goal: Ability to manage health-related needs will improve Outcome: Progressing   

## 2021-09-09 NOTE — Progress Notes (Signed)
  Transition of Care [facility]) Screening Note   Patient Details  Name: Ryan [name] Date of Birth: Mar 05, 1951   Transition of Care [facility]) CM/SW Contact:    Ihor Gully, LCSW Phone Number: 09/09/2021, 3:20 PM  [facility] Notification Your notification ID is: [id number]  Transition of Care Department [facility]) has reviewed patient and no TOC needs have been identified at this time. We will continue to monitor patient advancement through interdisciplinary progression rounds. If new patient transition needs arise, please place a TOC consult.

## 2021-09-09 NOTE — Progress Notes (Signed)
Patient's blood pressure has remained low this shift. His MAP remains >65. Dr stated to give Midodrine dose early this morning. Explained to patient why he was getting his 8am dose early.

## 2021-09-09 NOTE — Progress Notes (Addendum)
[facility]                                                                               Fox Salminen, is a 71 y.o. male, DOB - September 17, 1950, [id number] Admit date - 09/08/2021    Outpatient Primary MD for the patient is [facility] [place]  LOS - 0  days    Brief summary    HEMI CHACKO is a 71 y.o. male with medical history significant for systolic and diastolic CHF, chronic bronchitis, alcohol abuse, COPD, hypertension.Patient presented to the ED with complaints of upper abdominal pain of 2 days duration.  He reports symptoms today with abdominal pain in his upper abdomen that radiated up into his chest and his left arm. CT of the abdomen shows pancreatitis and duodenitis in the second and third portion of the duodenum. And was admitted for evaluation and management of acute pancreatitis and acute duodenitis.  Assessment & Plan    Assessment and Plan: * Abdominal pain, suspect a combination of acute on chronic pancreatitis and acute duodenitis. In the setting of chronic pancreatitis. CT of the abdomen today showed duodenitis in the second and third portion of the duodenum and chronic pancreatitis, dilatation of main pancreatic duct. GI consulted. N.p.o., IV fluids, IV antiemetics.   History of nonischemic cardiomyopathy Patient reports epigastric pain EKG does not show any ischemic changes. Troponins are negative. Last echo 07/2021, EF of less than 20% with grade 3 restrictive diastolic dysfunction.   Essential hypertension BP parameters are soft. Holding BP meds and continue with midodrine.   COPD (chronic obstructive pulmonary disease) (HCC) No wheezing heard and continue with bronchodilators.   Dilation of pancreatic duct CT/CT shows diffuse dilatation of the main pancreatic duct.  Consideration for pancreatic mass in the head of the pancreas.  Also possible duodenitis or infiltrative changes.   - Per care everywhere 04/2021-at [facility]  clinic, per general surgery notes.  A follow-up MRI Abdomen with, without contrast completed on November 29, 2020 confirmed CBD dilation to 8 mm and common hepatic artery dilation to 1.5 cm. The main pancreas duct was dilated to the pancreas head with atrophy of the body and tail. An EUS and ERCP were completed on January 30, 2021. A significant amount of retained gastric contents limited the procedure. Groove pancreatitis was noted. A CT of the Abdomen completed on March 01, 2021 did reveal narrowing of the portal vein confluence along with peri-pancreatic stranding. Concerns for an impending gastric outlet obstruction was noted. An endoscopic gastroduodenostomy with an axios stent was therefore completed for a duodenal stricture. His most recent CT of the Abdomen completed on April 21, 2021 revealed ongoing inflammation within the pancreas head. Although no obvious mass is seen on this scan, significant portal vein narrowing at the confluence is still noted.  The plan/assessment at that time was to defer to GI team. (Please see detailed notes including assessment and plan in care everywhere).       GI consulted for further evaluation.   Chronic pancreatitis ([place]) Resume Creon Patient has a history of alcohol abuse currently reports no alcohol intake.     Estimated body mass index is  24.69 kg/m as calculated from the following:   Height as of this encounter: '5\' 11"'$  (1.803 m).   Weight as of this encounter: 80.3 kg.  Code Status: full code.  DVT Prophylaxis:  heparin injection 5,000 Units Start: 09/08/21 2330   Level of Care: Level of care: Telemetry Family Communication: none at bedside.   Disposition Plan:     Remains inpatient appropriate:  IV  fluids and IV pain meds.   Procedures:  None.   Consultants:   None.   Antimicrobials:   Anti-infectives (From admission, onward)    None        Medications  Scheduled Meds:  dorzolamide-timolol  1 drop Both Eyes BID    feeding supplement  237 mL Oral BID BM   gabapentin  600 mg Oral BID   heparin injection (subcutaneous)  5,000 Units Subcutaneous Q8H   ivabradine  5 mg Oral BID WC   latanoprost  1 drop Both Eyes QHS   lipase/protease/amylase  36,000 Units Oral TID WC   midodrine  10 mg Oral TID WC   pantoprazole (PROTONIX) IV  40 mg Intravenous QHS   Continuous Infusions:  0.9 % NaCl with KCl 20 mEq / L 50 mL/hr at 09/08/21 2045   PRN Meds:.acetaminophen **OR** acetaminophen, albuterol, gabapentin, HYDROmorphone (DILAUDID) injection, polyethylene glycol    Subjective:   Cristoval Teall was seen and examined today.  Nauseated, intermittent abdominal pain.   Objective:   Vitals:   09/09/21 0621 09/09/21 0626 09/09/21 0753 09/09/21 1027  BP: (!) 89/70 (!) 86/70 103/82 101/74  Pulse: 88   87  Resp: 18   18  Temp: 98.4 F (36.9 C)   (!) 97.5 F (36.4 C)  TempSrc:    Oral  SpO2: 100%   100%  Weight:      Height:        Intake/Output Summary (Last 24 hours) at 09/09/2021 1047 Last data filed at 09/09/2021 0300 Gross per 24 hour  Intake 312.1 ml  Output --  Net 312.1 ml   Filed Weights   09/08/21 0958 09/08/21 1800  Weight: 80.3 kg 80.3 kg     Exam General exam: Appears calm and comfortable  Respiratory system: Clear to auscultation. Respiratory effort normal. Cardiovascular system: S1 & S2 heard, RRR. No JVD,  No pedal edema. Gastrointestinal system: Abdomen is epigastric tenderness,  Normal bowel sounds heard. Central nervous system: Alert and oriented. No focal neurological deficits. Extremities: Symmetric 5 x 5 power. Skin: No rashes, lesions or ulcers Psychiatry: Mood & affect appropriate.    Data Reviewed:  I have personally reviewed following labs and imaging studies   CBC Lab Results  Component Value Date   WBC 6.7 09/09/2021   RBC 4.60 09/09/2021   HGB 13.1 09/09/2021   HCT 41.6 09/09/2021   MCV 90.4 09/09/2021   MCH 28.5 09/09/2021   PLT 203 09/09/2021   MCHC  31.5 09/09/2021   RDW 18.4 (H) 09/09/2021   LYMPHSABS 1.3 09/08/2021   MONOABS 0.6 09/08/2021   EOSABS 0.3 09/08/2021   BASOSABS 0.0 49/70/2637     Last metabolic panel Lab Results  Component Value Date   NA 136 09/09/2021   K 4.5 09/09/2021   CL 106 09/09/2021   CO2 23 09/09/2021   BUN 14 09/09/2021   CREATININE 1.09 09/09/2021   GLUCOSE 121 (H) 09/09/2021   GFRNONAA >60 09/09/2021   GFRAA >60 10/08/2019   CALCIUM 8.8 (L) 09/09/2021   PHOS 2.3 (L) 03/07/2019  PROT 7.1 09/08/2021   ALBUMIN 3.6 09/08/2021   BILITOT 0.9 09/08/2021   ALKPHOS 128 (H) 09/08/2021   AST 30 09/08/2021   ALT 29 09/08/2021   ANIONGAP 7 09/09/2021    CBG (last 3)  No results for input(s): "GLUCAP" in the last 72 hours.    Coagulation Profile: No results for input(s): "INR", "PROTIME" in the last 168 hours.   Radiology Studies: CT ABDOMEN PELVIS W CONTRAST  Result Date: 09/08/2021 CLINICAL DATA:  Left upper quadrant abdominal pain. Recent diagnosis of pancreatitis. EXAM: CT ABDOMEN AND PELVIS WITH CONTRAST TECHNIQUE: Multidetector CT imaging of the abdomen and pelvis was performed using the standard protocol following bolus administration of intravenous contrast. RADIATION DOSE REDUCTION: This exam was performed according to the departmental dose-optimization program which includes automated exposure control, adjustment of the mA and/or kV according to patient size and/or use of iterative reconstruction technique. CONTRAST:  166m OMNIPAQUE IOHEXOL 300 MG/ML  SOLN COMPARISON:  June 28, 2021 FINDINGS: Lower chest: Minimal bibasilar pleural thickening and subpleural scarring. Enlarged heart. Hepatobiliary: No focal liver abnormality is seen. No gallstones, gallbladder wall thickening, or biliary dilatation. Pancreas: Diffuse dilation of the main pancreatic duct with overall atrophic pancreatic parenchyma. Coarse calcifications within the head of the pancreas. Spleen: Normal in size without focal  abnormality. Adrenals/Urinary Tract: Left upper pole renal lesion, too small to be actually characterized by CT. No evidence of hydronephrosis or hydroureter. Nonobstructive Large 1.7 cm right lower pole renal calculus. Stomach/Bowel: Again seen is tubular device connecting the stomach with the underlying jejunal segment. Persistent indistinctness/edema of the second and third portion of the duodenum. Vascular/Lymphatic: Aortic atherosclerosis. No enlarged abdominal or pelvic lymph nodes. Reproductive: Coarse calcifications of the prostate gland. Other: Mild mesenteric edema in the upper abdomen. Musculoskeletal: Advanced lytic and sclerotic changes of the left proximal femur. 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 IMPRESSION: 1. Diffuse dilation of the main pancreatic duct with overall atrophic pancreatic parenchyma. Coarse calcifications within the head of the pancreas. These findings may represent sequela of chronic pancreatitis. Pancreatic mass in the head of the pancreas cannot be entirely excluded. 2. Persistent indistinctness/edema of the second and third portion of the duodenum, which may be due to duodenitis or infiltrative changes. Correlation to endoscopy results is recommended. 3. Nonobstructive right renal calculus. 4. Advanced lytic and sclerotic changes of the left proximal femur, which may be due to infectious/inflammatory etiology, posttraumatic changes. 5. 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 Aortic Atherosclerosis (ICD10-I70.0). Electronically Signed   By: DFidela SalisburyM.D.   On: 09/08/2021 15:24   DG Chest Port 1 View  Result Date: 09/08/2021 CLINICAL DATA:  Chest pain EXAM: PORTABLE CHEST 1 VIEW COMPARISON:  08/26/2018 FINDINGS: Transverse diameter of heart is increased. There are no signs of pulmonary edema or focal pulmonary consolidation. Small linear densities seen in the right lower lung fields. There is no significant pleural effusion or pneumothorax.  IMPRESSION: Cardiomegaly. There are no signs of pulmonary edema or focal pulmonary consolidation. Small linear densities in the right lower lung fields suggest scarring or subsegmental atelectasis. Electronically Signed   By: PElmer PickerM.D.   On: 09/08/2021 10:28       VHosie PoissonM.D. [facility] 09/09/2021, 10:47 AM  Available via Epic secure chat 7am-7pm After 7 pm, please refer to night coverage provider listed on amion.
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
## 2021-09-09 NOTE — Progress Notes (Signed)
Manual blood pressure 86/70. MD  notified

## 2021-09-10 ENCOUNTER — Inpatient Hospital Stay (HOSPITAL_COMMUNITY): Payer: No Typology Code available for payment source

## 2021-09-10 LAB — COMPREHENSIVE METABOLIC PANEL
ALT: 21 U/L (ref 0–44)
AST: 25 U/L (ref 15–41)
Albumin: 3.8 g/dL (ref 3.5–5.0)
Alkaline Phosphatase: 126 U/L (ref 38–126)
Anion gap: 6 (ref 5–15)
BUN: 15 mg/dL (ref 8–23)
CO2: 25 mmol/L (ref 22–32)
Calcium: 9.4 mg/dL (ref 8.9–10.3)
Chloride: 104 mmol/L (ref 98–111)
Creatinine, Ser: 1.05 mg/dL (ref 0.61–1.24)
GFR, Estimated: >60 mL/min (ref >60)
Glucose, Bld: 99 mg/dL (ref 70–99)
Potassium: 4 mmol/L (ref 3.5–5.1)
Sodium: 135 mmol/L (ref 135–145)
Total Bilirubin: 0.7 mg/dL (ref 0.3–1.2)
Total Protein: 7.6 g/dL (ref 6.5–8.1)

## 2021-09-10 LAB — CBC WITH DIFFERENTIAL/PLATELET
Abs Immature Granulocytes: 0.02 10*3/uL (ref 0.00–0.07)
Basophils Absolute: 0.1 10*3/uL (ref 0.0–0.1)
Basophils Relative: 1 %
Eosinophils Absolute: 0.5 10*3/uL (ref 0.0–0.5)
Eosinophils Relative: 6 %
HCT: 45.7 % (ref 39.0–52.0)
Hemoglobin: 14 g/dL (ref 13.0–17.0)
Immature Granulocytes: 0 %
Lymphocytes Relative: 16 %
Lymphs Abs: 1.3 10*3/uL (ref 0.7–4.0)
MCH: 27.9 pg (ref 26.0–34.0)
MCHC: 30.6 g/dL (ref 30.0–36.0)
MCV: 91.2 fL (ref 80.0–100.0)
Monocytes Absolute: 0.6 10*3/uL (ref 0.1–1.0)
Monocytes Relative: 7 %
Neutro Abs: 6 10*3/uL (ref 1.7–7.7)
Neutrophils Relative %: 70 %
Platelets: 229 10*3/uL (ref 150–400)
RBC: 5.01 MIL/uL (ref 4.22–5.81)
RDW: 18.8 % — ABNORMAL HIGH (ref 11.5–15.5)
WBC: 8.4 10*3/uL (ref 4.0–10.5)
nRBC: 0 % (ref 0.0–0.2)

## 2021-09-10 LAB — LIPASE, BLOOD: Lipase: 84 U/L — ABNORMAL HIGH (ref 11–51)

## 2021-09-10 LAB — GLUCOSE, CAPILLARY
Glucose-Capillary: 237 mg/dL — ABNORMAL HIGH (ref 70–99)
Glucose-Capillary: 120 mg/dL — ABNORMAL HIGH (ref 70–99)
Glucose-Capillary: 85 mg/dL (ref 70–99)

## 2021-09-10 MED ORDER — SODIUM CHLORIDE 0.9 % IV SOLN: INTRAVENOUS | Status: DC

## 2021-09-10 MED ORDER — LACTATED RINGERS IV BOLUS
500.0000 mL | Freq: Once | INTRAVENOUS | Status: AC
  Administered 2021-09-10: 500 mL via INTRAVENOUS

## 2021-09-10 NOTE — Progress Notes (Signed)
[facility]                                                                               Ryan [name], is a 71 y.o. male, DOB - 1950-04-29, ZOX:[id number] Admit date - 09/08/2021    Outpatient Primary MD for the patient is [facility] [place]  LOS - 1  days    Brief summary    Ryan [name] is a 71 y.o. male with medical history significant for systolic and diastolic CHF, chronic bronchitis, alcohol abuse, COPD, hypertension.Patient presented to the ED with complaints of upper abdominal pain of 2 days duration.  He reports symptoms today with abdominal pain in his upper abdomen that radiated up into his chest and his left arm. CT of the abdomen shows pancreatitis and duodenitis in the second and third portion of the duodenum. And was admitted for evaluation and management of acute pancreatitis and acute duodenitis.  Assessment & Plan    Assessment and Plan: * Abdominal pain, suspect a combination of acute on chronic pancreatitis and acute duodenitis. In the setting of chronic pancreatitis. CT of the abdomen today showed duodenitis in the second and third portion of the duodenum and chronic pancreatitis, dilatation of main pancreatic duct. GI consulted appreciate recommendations. IV PPI BID, he was started on clears, advance as tolerated.  Gastroenterology recommends of repeat imaging in 8 to 12 weeks and follow up with GI as outpatient for repeat EUS.  He was started on IV fluids, anti emetics.    History of nonischemic cardiomyopathy Patient reports epigastric pain EKG does not show any ischemic changes. Troponins are negative. Last echo 07/2021, EF of less than 20% with grade 3 restrictive diastolic dysfunction.   Essential hypertension Hypotensive this afternoon, small fluid bolus, and continue with Midodrine 10 mg tid.  Hold corlanor and bp meds.   COPD (chronic obstructive pulmonary disease) (HCC) No wheezing heard and continue with  bronchodilators.   Dilation of pancreatic duct CT/CT shows diffuse dilatation of the main pancreatic duct.  Consideration for pancreatic mass in the head of the pancreas.  Also possible duodenitis or infiltrative changes.   - Per care everywhere 04/2021-at [facility], per general surgery notes.  A follow-up MRI Abdomen with, without contrast completed on November 29, 2020 confirmed CBD dilation to 8 mm and common hepatic artery dilation to 1.5 cm. The main pancreas duct was dilated to the pancreas head with atrophy of the body and tail. An EUS and ERCP were completed on January 30, 2021. A significant amount of retained gastric contents limited the procedure. Groove pancreatitis was noted. A CT of the Abdomen completed on March 01, 2021 did reveal narrowing of the portal vein confluence along with peri-pancreatic stranding. Concerns for an impending gastric outlet obstruction was noted. An endoscopic gastroduodenostomy with an axios stent was therefore completed for a duodenal stricture. His most recent CT of the Abdomen completed on April 21, 2021 revealed ongoing inflammation within the pancreas head. Although no obvious mass is seen on this scan, significant portal vein narrowing at the confluence is still noted.  The plan/assessment at that time was to defer to GI team. (Please see detailed  notes including assessment and plan in care everywhere).       GI consulted for further evaluation.  Recommend outpatient follow up with Gi for EUS.   Chronic pancreatitis ([place]) Resume Creon Patient has a history of alcohol abuse  currently reports no alcohol intake in over an year.   Interventions:  (will add ONS when diet is advanced)  Estimated body mass index is 24.69 kg/m as calculated from the following:   Height as of this encounter: '5\' 11"'$  (1.803 m).   Weight as of this encounter: 80.3 kg.  Code Status: full code.  DVT Prophylaxis:  heparin injection 5,000 Units Start: 09/08/21  2330   Level of Care: Level of care: Telemetry Family Communication: none at bedside.   Disposition Plan:     Remains inpatient appropriate:  IV  fluids and IV pain meds.   Procedures:  None.   Consultants:   GI  Antimicrobials:   Anti-infectives (From admission, onward)    None        Medications  Scheduled Meds:  dorzolamide-timolol  1 drop Both Eyes BID   feeding supplement  237 mL Oral BID BM   gabapentin  600 mg Oral BID   heparin injection (subcutaneous)  5,000 Units Subcutaneous Q8H   latanoprost  1 drop Both Eyes QHS   lipase/protease/amylase  36,000 Units Oral TID WC   midodrine  10 mg Oral TID WC   pantoprazole (PROTONIX) IV  40 mg Intravenous QHS   Continuous Infusions:   PRN Meds:.acetaminophen **OR** acetaminophen, albuterol, gabapentin, HYDROmorphone (DILAUDID) injection, polyethylene glycol    Subjective:   Ryan [name] was seen and examined today. SOME NAUSEA, abd pain improving, able to tolerate clears.    Objective:   Vitals:   09/09/21 2152 09/10/21 0455 09/10/21 1300 09/10/21 1437  BP: (!) 92/55 102/89 94/73 (!) 79/57  Pulse: 95 72 83 84  Resp: '20 18 18 '$ (!) 22  Temp: (!) 97.5 F (36.4 C) 97.7 F (36.5 C) 97.8 F (36.6 C) 97.8 F (36.6 C)  TempSrc: Oral Oral Oral Oral  SpO2: 95% 98% 100% 100%  Weight:      Height:        Intake/Output Summary (Last 24 hours) at 09/10/2021 1457 Last data filed at 09/10/2021 1200 Gross per 24 hour  Intake 1236.6 ml  Output 460 ml  Net 776.6 ml    Filed Weights   09/08/21 0958 09/08/21 1800  Weight: 80.3 kg 80.3 kg     Exam General exam: Appears calm and comfortable  Respiratory system: Clear to auscultation. Respiratory effort normal. Cardiovascular system: S1 & S2 heard, RRR. No JVD,  No pedal edema. Gastrointestinal system: Abdomen is soft, tender int he epigastric area, no distention. Normal bowel sounds heard. Central nervous system: Alert and oriented. No focal neurological  deficits. Extremities: Symmetric 5 x 5 power. Skin: No rashes, lesions or ulcers Psychiatry: Mood & affect appropriate.    Data Reviewed:  I have personally reviewed following labs and imaging studies   CBC Lab Results  Component Value Date   WBC 8.4 09/10/2021   RBC 5.01 09/10/2021   HGB 14.0 09/10/2021   HCT 45.7 09/10/2021   MCV 91.2 09/10/2021   MCH 27.9 09/10/2021   PLT 229 09/10/2021   MCHC 30.6 09/10/2021   RDW 18.8 (H) 09/10/2021   LYMPHSABS 1.3 09/10/2021   MONOABS 0.6 09/10/2021   EOSABS 0.5 09/10/2021   BASOSABS 0.1 96/28/3662     Last metabolic panel Lab Results  Component Value Date   NA 135 09/10/2021   K 4.0 09/10/2021   CL 104 09/10/2021   CO2 25 09/10/2021   BUN 15 09/10/2021   CREATININE 1.05 09/10/2021   GLUCOSE 99 09/10/2021   GFRNONAA >60 09/10/2021   GFRAA >60 10/08/2019   CALCIUM 9.4 09/10/2021   PHOS 2.3 (L) 03/07/2019   PROT 7.6 09/10/2021   ALBUMIN 3.8 09/10/2021   BILITOT 0.7 09/10/2021   ALKPHOS 126 09/10/2021   AST 25 09/10/2021   ALT 21 09/10/2021   ANIONGAP 6 09/10/2021    CBG (last 3)  Recent Labs    09/10/21 1111 09/10/21 1330  GLUCAP 237* 120*      Coagulation Profile: No results for input(s): "INR", "PROTIME" in the last 168 hours.   Radiology Studies: CT ABDOMEN PELVIS W CONTRAST  Result Date: 09/08/2021 CLINICAL DATA:  Left upper quadrant abdominal pain. Recent diagnosis of pancreatitis. EXAM: CT ABDOMEN AND PELVIS WITH CONTRAST TECHNIQUE: Multidetector CT imaging of the abdomen and pelvis was performed using the standard protocol following bolus administration of intravenous contrast. RADIATION DOSE REDUCTION: This exam was performed according to the departmental dose-optimization program which includes automated exposure control, adjustment of the mA and/or kV according to patient size and/or use of iterative reconstruction technique. CONTRAST:  134m OMNIPAQUE IOHEXOL 300 MG/ML  SOLN COMPARISON:  June 28, 2021  FINDINGS: Lower chest: Minimal bibasilar pleural thickening and subpleural scarring. Enlarged heart. Hepatobiliary: No focal liver abnormality is seen. No gallstones, gallbladder wall thickening, or biliary dilatation. Pancreas: Diffuse dilation of the main pancreatic duct with overall atrophic pancreatic parenchyma. Coarse calcifications within the head of the pancreas. Spleen: Normal in size without focal abnormality. Adrenals/Urinary Tract: Left upper pole renal lesion, too small to be actually characterized by CT. No evidence of hydronephrosis or hydroureter. Nonobstructive Large 1.7 cm right lower pole renal calculus. Stomach/Bowel: Again seen is tubular device connecting the stomach with the underlying jejunal segment. Persistent indistinctness/edema of the second and third portion of the duodenum. Vascular/Lymphatic: Aortic atherosclerosis. No enlarged abdominal or pelvic lymph nodes. Reproductive: Coarse calcifications of the prostate gland. Other: Mild mesenteric edema in the upper abdomen. Musculoskeletal: Advanced lytic and sclerotic changes of the left proximal femur. 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 IMPRESSION: 1. Diffuse dilation of the main pancreatic duct with overall atrophic pancreatic parenchyma. Coarse calcifications within the head of the pancreas. These findings may represent sequela of chronic pancreatitis. Pancreatic mass in the head of the pancreas cannot be entirely excluded. 2. Persistent indistinctness/edema of the second and third portion of the duodenum, which may be due to duodenitis or infiltrative changes. Correlation to endoscopy results is recommended. 3. Nonobstructive right renal calculus. 4. Advanced lytic and sclerotic changes of the left proximal femur, which may be due to infectious/inflammatory etiology, posttraumatic changes. 5. 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 Aortic Atherosclerosis (ICD10-I70.0). Electronically Signed   By: DFidela SalisburyM.D.   On: 09/08/2021 15:24       VHosie PoissonM.D. [facility] 09/10/2021, 2:57 PM  Available via Epic secure chat 7am-7pm After 7 pm, please refer to night coverage provider listed on amion.

## 2021-09-10 NOTE — Progress Notes (Signed)
Subjective: Patient states he is feeling slightly better today.  Abdominal pain improved.  Tolerating clear liquids.  Does note dry heaving this morning though states this was due to becoming dizzy from standing up too quickly.  Objective: Vital signs in last 24 hours: Temp:  [97.5 F (36.4 C)-97.9 F (36.6 C)] 97.7 F (36.5 C) (07/01 0455) Pulse Rate:  [72-100] 72 (07/01 0455) Resp:  [18-20] 18 (07/01 0455) BP: (92-102)/(55-89) 102/89 (07/01 0455) SpO2:  [95 %-100 %] 98 % (07/01 0455) Last BM Date : 09/09/21 General:   Alert and oriented, pleasant Head:  Normocephalic and atraumatic. Eyes:  No icterus, sclera clear. Conjuctiva pink.  Abdomen:  Bowel sounds present, soft, non-tender, non-distended. No HSM or hernias noted. No rebound or guarding. No masses appreciated  Msk:  Symmetrical without gross deformities. Normal posture. Extremities:  Without clubbing or edema. Neurologic:  Alert and  oriented x4;  grossly normal neurologically. Skin:  Warm and dry, intact without significant lesions.  Cervical Nodes:  No significant cervical adenopathy. Psych:  Alert and cooperative. Normal mood and affect.  Intake/Output from previous day: 06/30 0701 - 07/01 0700 In: 396.6 [I.V.:396.6] Out: 460 [Urine:460] Intake/Output this shift: Total I/O In: 840 [P.O.:840] Out: -   Lab Results: Recent Labs    09/08/21 1012 09/09/21 0449 09/10/21 0631  WBC 9.0 6.7 8.4  HGB 15.4 13.1 14.0  HCT 47.8 41.6 45.7  PLT 263 203 229   BMET Recent Labs    09/08/21 1102 09/09/21 0449 09/10/21 0631  NA 136 136 135  K 3.6 4.5 4.0  CL 101 106 104  CO2 '29 23 25  '$ GLUCOSE 114* 121* 99  BUN '14 14 15  '$ CREATININE 1.04 1.09 1.05  CALCIUM 9.9 8.8* 9.4   LFT Recent Labs    09/08/21 1102 09/10/21 0631  PROT 7.1 7.6  ALBUMIN 3.6 3.8  AST 30 25  ALT 29 21  ALKPHOS 128* 126  BILITOT 0.9 0.7   PT/INR No results for input(s): "LABPROT", "INR" in the last 72 hours. Hepatitis Panel No results  for input(s): "HEPBSAG", "HCVAB", "HEPAIGM", "HEPBIGM" in the last 72 hours.   Studies/Results: CT ABDOMEN PELVIS W CONTRAST  Result Date: 09/08/2021 CLINICAL DATA:  Left upper quadrant abdominal pain. Recent diagnosis of pancreatitis. EXAM: CT ABDOMEN AND PELVIS WITH CONTRAST TECHNIQUE: Multidetector CT imaging of the abdomen and pelvis was performed using the standard protocol following bolus administration of intravenous contrast. RADIATION DOSE REDUCTION: This exam was performed according to the departmental dose-optimization program which includes automated exposure control, adjustment of the mA and/or kV according to patient size and/or use of iterative reconstruction technique. CONTRAST:  128m OMNIPAQUE IOHEXOL 300 MG/ML  SOLN COMPARISON:  June 28, 2021 FINDINGS: Lower chest: Minimal bibasilar pleural thickening and subpleural scarring. Enlarged heart. Hepatobiliary: No focal liver abnormality is seen. No gallstones, gallbladder wall thickening, or biliary dilatation. Pancreas: Diffuse dilation of the main pancreatic duct with overall atrophic pancreatic parenchyma. Coarse calcifications within the head of the pancreas. Spleen: Normal in size without focal abnormality. Adrenals/Urinary Tract: Left upper pole renal lesion, too small to be actually characterized by CT. No evidence of hydronephrosis or hydroureter. Nonobstructive Large 1.7 cm right lower pole renal calculus. Stomach/Bowel: Again seen is tubular device connecting the stomach with the underlying jejunal segment. Persistent indistinctness/edema of the second and third portion of the duodenum. Vascular/Lymphatic: Aortic atherosclerosis. No enlarged abdominal or pelvic lymph nodes. Reproductive: Coarse calcifications of the prostate gland. Other: Mild mesenteric edema in the upper  abdomen. Musculoskeletal: Advanced lytic and sclerotic changes of the left proximal femur. 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 IMPRESSION: 1. Diffuse  dilation of the main pancreatic duct with overall atrophic pancreatic parenchyma. Coarse calcifications within the head of the pancreas. These findings may represent sequela of chronic pancreatitis. Pancreatic mass in the head of the pancreas cannot be entirely excluded. 2. Persistent indistinctness/edema of the second and third portion of the duodenum, which may be due to duodenitis or infiltrative changes. Correlation to endoscopy results is recommended. 3. Nonobstructive right renal calculus. 4. Advanced lytic and sclerotic changes of the left proximal femur, which may be due to infectious/inflammatory etiology, posttraumatic changes. 5. 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 Aortic Atherosclerosis (ICD10-I70.0). Electronically Signed   By: Fidela Salisbury M.D.   On: 09/08/2021 15:24    Assessment: *Acute on chronic pancreatitis *Pancreatic ductal dilatation *?  Pancreatic head mass  Plan: Patient notes clinical improvement today.  Continue conservative measures including analgesia, antiemetics.  Agree with starting on clear liquids.  Advance as tolerated.  Continue on Creon  Continue PPI twice daily given duodenitis on CT imaging.  Repeat CT imaging in 8 to 12 weeks.  We will need follow-up with primary GI, may benefit from repeat EUS.  Elon Alas. Abbey Chatters, D.O. Gastroenterology and Hepatology [facility]   LOS: 1 day    09/10/2021, 12:43 PM

## 2021-09-10 NOTE — Progress Notes (Signed)
Patient called out stating he felt dizzy and weak, Vital signs taken and MD paged, MD placed order for LR bolus refer to mar for dose, at 1611 rechecked vital signs patient still felt dizzy and weak MD paged again, MD placed order for CT of head at 1620. Will continue to assess throughout shift.

## 2021-09-10 NOTE — Progress Notes (Signed)
Pt refused Heparin injection earlier in shift, educated on purpose of Heparin injections, states "I walk and exercise, I should be OK". SRP, RN

## 2021-09-10 NOTE — Progress Notes (Signed)
   09/10/21 1437  Assess: MEWS Score  Temp 97.8 F (36.6 C)  BP (!) 79/57  MAP (mmHg) 65  Pulse Rate 84  Resp (!) 22  SpO2 100 %  O2 Device Room Air  Assess: MEWS Score  MEWS Temp 0  MEWS Systolic 2  MEWS Pulse 0  MEWS RR 1  MEWS LOC 0  MEWS Score 3  MEWS Score Color Yellow  Assess: if the MEWS score is Yellow or Red  Were vital signs taken at a resting state? Yes  Focused Assessment No change from prior assessment  MEWS guidelines implemented *See Row Information* Yes  Treat  MEWS Interventions Escalated (See documentation below);Other (Comment) (MD notified)  Pain Scale 0-10  Pain Score 7  Pain Type Acute pain  Pain Location Abdomen  Pain Orientation Mid  Pain Descriptors / Indicators Aching  Pain Frequency Intermittent  Pain Onset On-going  Pain Intervention(s) Medication (See eMAR)  Multiple Pain Sites No  Take Vital Signs  Increase Vital Sign Frequency  Yellow: Q 2hr X 2 then Q 4hr X 2, if remains yellow, continue Q 4hrs  Escalate  MEWS: Escalate Yellow: discuss with charge nurse/RN and consider discussing with provider and RRT  Notify: Charge Nurse/RN  Name of Charge Nurse/RN Notified Zenaida Deed RN  Date Charge Nurse/RN Notified 09/10/21  Time Charge Nurse/RN Notified 1441  Notify: Provider  Provider Name/Title Dr. Karleen Hampshire  Date Provider Notified 09/10/21  Time Provider Notified 1441  Method of Notification Page  Notification Reason Other (Comment) (B/P and RR change)  Provider response No new orders (at this time awaiting for response)  Assess: SIRS CRITERIA  SIRS Temperature  0  SIRS Pulse 0  SIRS Respirations  1  SIRS WBC 0  SIRS Score Sum  1

## 2021-09-11 DIAGNOSIS — N1831 Chronic kidney disease, stage 3a: Secondary | ICD-10-CM

## 2021-09-11 DIAGNOSIS — I951 Orthostatic hypotension: Secondary | ICD-10-CM

## 2021-09-11 DIAGNOSIS — K8689 Other specified diseases of pancreas: Secondary | ICD-10-CM

## 2021-09-11 MED ORDER — ONDANSETRON HCL 4 MG/2ML IJ SOLN
4.0000 mg | Freq: Four times a day (QID) | INTRAMUSCULAR | Status: DC | PRN
  Administered 2021-09-11 – 2021-09-12 (×2): 4 mg via INTRAVENOUS
  Filled 2021-09-11 (×2): qty 2

## 2021-09-11 NOTE — Hospital Course (Addendum)
71 y.o. male with medical history significant for systolic and diastolic CHF, chronic bronchitis, alcohol abuse, COPD, chronic chest pain, orthostatic hypotension. Patient presented to the ED with complaints of upper abdominal pain of 2 days duration.  He reports symptoms today with abdominal pain in his upper abdomen that radiated up into his chest and his left arm.  Reports pain is similar to his prior pancreatitis pain.  Reports occasional dizziness that is unchanged.  Reports chronic difficulty breathing, dyspnea on exertion is also unchanged. Patient states that he has been sober from alcohol for nearly 9 months. He denies any fevers, chills, chest pain, coughing, hemoptysis, vomiting, diarrhea, hematochezia, melena. ED Course: Tmax 98.6.  Heart rate 94-107.  Respiratory rate 15-25.  Blood pressure systolic 782-423.  Sats between 96% on room air.  Lipase 157.  Troponin 11 X 2.  Chest x-ray shows scarring or atelectasis.  CT abdomen and pelvis-shows chronic pancreatitis, dilatation of the main pancreatic duct, pancreatic mass and the head of the pancreas not excluded.  Also suggest duodenitis or infiltrative changes in the second and third portion of duodenum. 4 mg IV morphine given without significant improvement in patient's pain. Hospitalist to admit for acute pancreatitis. Recent hospitalization 6/15 to 6/22 for acute on chronic systolic and diastolic CHF also with AKI on CKD.  Patient was discharged with torsemide 60 mg daily.  Discharge weight was 74.7 kg.  GI was consulted to assist with management.  The patient was started on judicious IV fluids initially.  He was initially n.p.o. with bowel rest and judicious IV opioids.  His improvement has been slow.  He has been advanced to clear liquids.  09/10/2021--patient developed some dizziness with transient hypotension with systolic in the upper 53I LR bolus given.  CT brain negative.  09/12/2021--having difficulty tolerating soft diet--complains of  abd pain without frank vomiting.  Having some "spit up"  Had BM.  No cp, no sob.  Requesting something "lighter" than dilaudid for pain  09/13/2021--tolerating soft diet.  Initially wanted to go home.  Then had some social circumstances for which he could not go till 09/14/21 am. Abd pain overall better.  Seems to be doing better with xanax prn rather than IV opioids

## 2021-09-11 NOTE — Assessment & Plan Note (Signed)
Baseline creatinine 1.1-1.4 Monitor serial BMP

## 2021-09-11 NOTE — Assessment & Plan Note (Signed)
Continue midodrine 10 mg 3 times daily Place TED hose Use abdominal binder if patient is able to tolerate

## 2021-09-11 NOTE — Plan of Care (Signed)

## 2021-09-11 NOTE — Progress Notes (Signed)
Subjective: Patient states he is feeling better today.  Abdominal pain improved.  Tolerating clear liquids.  Wishes to advance diet.  Objective: Vital signs in last 24 hours: Temp:  [97.7 F (36.5 C)-97.8 F (36.6 C)] 97.7 F (36.5 C) (07/02 0501) Pulse Rate:  [78-96] 78 (07/02 0501) Resp:  [18-22] 18 (07/02 0501) BP: (79-101)/(57-79) 93/68 (07/02 0501) SpO2:  [66 %-100 %] 66 % (07/02 0501) Last BM Date : 09/10/21 General:   Alert and oriented, pleasant Head:  Normocephalic and atraumatic. Eyes:  No icterus, sclera clear. Conjuctiva pink.  Abdomen:  Bowel sounds present, soft, non-tender, non-distended. No HSM or hernias noted. No rebound or guarding. No masses appreciated  Msk:  Symmetrical without gross deformities. Normal posture. Extremities:  Without clubbing or edema. Neurologic:  Alert and  oriented x4;  grossly normal neurologically. Skin:  Warm and dry, intact without significant lesions.  Cervical Nodes:  No significant cervical adenopathy. Psych:  Alert and cooperative. Normal mood and affect.  Intake/Output from previous day: 07/01 0701 - 07/02 0700 In: 1769.2 [P.O.:1440; I.V.:11.4; IV Piggyback:317.8] Out: -  Intake/Output this shift: Total I/O In: 240 [P.O.:240] Out: -   Lab Results: Recent Labs    09/09/21 0449 09/10/21 0631  WBC 6.7 8.4  HGB 13.1 14.0  HCT 41.6 45.7  PLT 203 229   BMET Recent Labs    09/08/21 1102 09/09/21 0449 09/10/21 0631  NA 136 136 135  K 3.6 4.5 4.0  CL 101 106 104  CO2 '29 23 25  '$ GLUCOSE 114* 121* 99  BUN '14 14 15  '$ CREATININE 1.04 1.09 1.05  CALCIUM 9.9 8.8* 9.4   LFT Recent Labs    09/08/21 1102 09/10/21 0631  PROT 7.1 7.6  ALBUMIN 3.6 3.8  AST 30 25  ALT 29 21  ALKPHOS 128* 126  BILITOT 0.9 0.7   PT/INR No results for input(s): "LABPROT", "INR" in the last 72 hours. Hepatitis Panel No results for input(s): "HEPBSAG", "HCVAB", "HEPAIGM", "HEPBIGM" in the last 72 hours.   Studies/Results: CT HEAD WO  CONTRAST (5MM)  Result Date: 09/10/2021 CLINICAL DATA:  Dizziness, dehydration or hypotension. Pancreatitis. EXAM: CT HEAD WITHOUT CONTRAST TECHNIQUE: Contiguous axial images were obtained from the base of the skull through the vertex without intravenous contrast. RADIATION DOSE REDUCTION: This exam was performed according to the departmental dose-optimization program which includes automated exposure control, adjustment of the mA and/or kV according to patient size and/or use of iterative reconstruction technique. COMPARISON:  None Available. FINDINGS: Brain: Mild atrophy and white matter changes are present. No acute infarct, hemorrhage, or mass lesion is present. Basal ganglia are intact. Insular ribbon is normal bilaterally. No acute or focal cortical abnormalities are present. The brainstem and cerebellum are within normal limits. Vascular: Atherosclerotic calcifications are present within the cavernous internal carotid arteries. No hyperdense vessel is present. Skull: Calvarium is intact. No focal lytic or blastic lesions are present. No significant extracranial soft tissue lesion is present. Sinuses/Orbits: The paranasal sinuses and mastoid air cells are clear. The globes and orbits are within normal limits. IMPRESSION: 1. No acute intracranial abnormality. 2. Mild atrophy and white matter disease likely reflects the sequela of chronic microvascular ischemia. Electronically Signed   By: San Morelle M.D.   On: 09/10/2021 18:37   DG Chest Port 1 View  Result Date: 09/10/2021 CLINICAL DATA:  Dyspnea.  History of hypertension. EXAM: PORTABLE CHEST 1 VIEW COMPARISON:  09/08/2021 and older studies.  Chest CT, 06/28/2021. FINDINGS: Stable enlargement of the  cardiac silhouette. No mediastinal or hilar masses. Stable interstitial prominence bilaterally. No evidence of pneumonia or pulmonary edema. No convincing pleural effusion or pneumothorax. Skeletal structures are grossly intact. IMPRESSION: No acute  cardiopulmonary disease. Electronically Signed   By: Lajean Manes M.D.   On: 09/10/2021 16:17    Assessment: *Acute on chronic pancreatitis *Pancreatic ductal dilatation *?  Pancreatic head mass  Plan: Patient notes clinical improvement today.  Continue conservative measures including analgesia, antiemetics.  We will advance to soft diet today.  Continue on Creon  Continue PPI twice daily given duodenitis on CT imaging.  Repeat CT imaging in 8 to 12 weeks.  We will need follow-up with primary GI, may benefit from repeat EUS.  Elon Alas. Abbey Chatters, D.O. Gastroenterology and Hepatology [facility]   LOS: 2 days    09/11/2021, 10:14 AM

## 2021-09-11 NOTE — Assessment & Plan Note (Signed)
Cessation discussed Continues to smoke few cigarettes per day

## 2021-09-11 NOTE — Progress Notes (Signed)
PROGRESS NOTE  ABAS LEICHT FYB:[id number] DOB: [birth year] DOA: 09/08/2021 PCP: [facility]  Brief History:  71 y.o. male with medical history significant for systolic and diastolic CHF, chronic bronchitis, alcohol abuse, COPD, chronic chest pain, orthostatic hypotension. Patient presented to the ED with complaints of upper abdominal pain of 2 days duration.  He reports symptoms today with abdominal pain in his upper abdomen that radiated up into his chest and his left arm.  Reports pain is similar to his prior pancreatitis pain.  Reports occasional dizziness that is unchanged.  Reports chronic difficulty breathing, dyspnea on exertion is also unchanged. Patient states that he has been sober from alcohol for nearly 9 months. He denies any fevers, chills, chest pain, coughing, hemoptysis, vomiting, diarrhea, hematochezia, melena. ED Course: Tmax 98.6.  Heart rate 94-107.  Respiratory rate 15-25.  Blood pressure systolic 527-782.  Sats between 96% on room air.  Lipase 157.  Troponin 11 X 2.  Chest x-ray shows scarring or atelectasis.  CT abdomen and pelvis-shows chronic pancreatitis, dilatation of the main pancreatic duct, pancreatic mass and the head of the pancreas not excluded.  Also suggest duodenitis or infiltrative changes in the second and third portion of duodenum. 4 mg IV morphine given without significant improvement in patient's pain. Hospitalist to admit for acute pancreatitis. Recent hospitalization 6/15 to 6/22 for acute on chronic systolic and diastolic CHF also with AKI on CKD.  Patient was discharged with torsemide 60 mg daily.  Discharge weight was 74.7 kg.  GI was consulted to assist with management.  The patient was started on judicious IV fluids initially.  He was initially n.p.o. with bowel rest and judicious IV opioids.  His improvement has been slow.  He has been advanced to clear liquids.  09/10/2021--patient developed some dizziness with transient  hypotension with systolic in the upper 42P LR bolus given.  CT brain negative.     Assessment and Plan: * Acute on chronic pancreatitis ([place]) Presented with lipase 157>>84 -Overall abdominal pain is slowly improving -Still continues to have intermittent dry heaving -Appreciate GI consult and follow-up -09/08/2021 CT abd--diffuse dilatation of main pancreatic duct with overall atrophy of pancreatic parenchyma.  Persistent indistinct deafness and edema of the second and third portion of the duodenum= duodenitis -Continue clear liquids -Continue PPI twice daily -Continue Creon -Patient has been sober for nearly 9 months -Continue IV Dilaudid 1 mg every 4 hours PRN  Dilation of pancreatic duct CT/CT shows diffuse dilatation of the main pancreatic duct.  Consideration for pancreatic mass in the head of the pancreas.  Also possible duodenitis or infiltrative changes.   - Per care everywhere 04/2021-at [facility] clinic, per general surgery notes.  A follow-up MRI Abdomen with, without contrast completed on November 29, 2020 confirmed CBD dilation to 8 mm and common hepatic artery dilation to 1.5 cm. The main pancreas duct was dilated to the pancreas head with atrophy of the body and tail. An EUS and ERCP were completed on January 30, 2021. A significant amount of retained gastric contents limited the procedure. Groove pancreatitis was noted. A CT of the Abdomen completed on March 01, 2021 did reveal narrowing of the portal vein confluence along with peri-pancreatic stranding. Concerns for an impending gastric outlet obstruction was noted. An endoscopic gastroduodenostomy with an axios stent was therefore completed for a duodenal stricture. His most recent CT of the Abdomen completed on April 21, 2021 revealed ongoing inflammation within  the pancreas head. Although no obvious mass is seen on this scan, significant portal vein narrowing at the confluence is still noted.   they felt the imaging  findings are concerning for an occult malignancy of the pancreas and but Dr. Marrion Coy was not certain whtether an attempt at an EUS to blindly biopsy the parenchymal tissus is possbie if a mass is not evident. He obtain CA 19-9 tumor marker and stated that if diagnosed with pancreatic malignancy that he felt as though successful operative intervention would be unlikely given his cardiac history with a EF of 15 to 20% along with ominous venous compromise on imaging.  He advised possible palliative radiation with systemic treatment if diagnosis confirmed    --GI following  Chronic HFrEF (heart failure with reduced ejection fraction) (HCC) Stable and compensated.  Recent prolonged hospitalization for same.  -Hold torsemide 60 mg daily for now as pt was hypotensive with dizziness 7/1 -Gentle hydration initially - Echo (5/23) EF < 20%, G3DD RV severely reduced, RVSP 46 mmHg, moderate MR - Has been off entresto and spiro with low BP  COPD (chronic obstructive pulmonary disease) (HCC) Stable on RA -Resume home bronchodilator regimen  Orthostatic hypotension Continue midodrine 10 mg 3 times daily Place TED hose Use abdominal binder if patient is able to tolerate  Chronic kidney disease, stage 3a (HCC) Baseline creatinine 1.1-1.4 Monitor serial BMP  Tobacco use disorder Cessation discussed Continues to smoke few cigarettes per day       Family Communication: no  Family at bedside  Consultants:  GI  Code Status:  FULL   DVT Prophylaxis:  [place] Heparin   Procedures: As Listed in Progress Note Above  Antibiotics: None      Subjective: Patient states that dizziness is better this morning.  He still has some abdominal pain.  He had some dry heaves in the evening of 09/10/2021.  He denies any fevers, chills, chest pain, vomiting, diarrhea.  There is no hematochezia or melena.  Objective: Vitals:   09/10/21 1732 09/10/21 2110 09/11/21 0108 09/11/21 0501  BP: 100/74 94/73  100/70 93/68  Pulse: 88 82 84 78  Resp:  '18 18 18  '$ Temp:  97.8 F (36.6 C) 97.7 F (36.5 C) 97.7 F (36.5 C)  TempSrc:  Oral Oral Oral  SpO2:  100% 100% (!) 66%  Weight:      Height:        Intake/Output Summary (Last 24 hours) at 09/11/2021 9381 Last data filed at 09/10/2021 2204 Gross per 24 hour  Intake 1769.19 ml  Output --  Net 1769.19 ml   Weight change:  Exam:  General:  Pt is alert, follows commands appropriately, not in acute distress HEENT: No icterus, No thrush, No neck mass, [place]/AT Cardiovascular: RRR, S1/S2, no rubs, no gallops Respiratory: Fine bibasilar crackles.  No wheezing Abdomen: Soft/+BS, non tender, non distended, no guarding Extremities: No edema, No lymphangitis, No petechiae, No rashes, no synovitis   Data Reviewed: I have personally reviewed following labs and imaging studies Basic Metabolic Panel: Recent Labs  Lab 09/08/21 1102 09/09/21 0449 09/10/21 0631  NA 136 136 135  K 3.6 4.5 4.0  CL 101 106 104  CO2 '29 23 25  '$ GLUCOSE 114* 121* 99  BUN '14 14 15  '$ CREATININE 1.04 1.09 1.05  CALCIUM 9.9 8.8* 9.4   Liver Function Tests: Recent Labs  Lab 09/08/21 1102 09/10/21 0631  AST 30 25  ALT 29 21  ALKPHOS 128* 126  BILITOT 0.9  0.7  PROT 7.1 7.6  ALBUMIN 3.6 3.8   Recent Labs  Lab 09/08/21 1102 09/10/21 0631  LIPASE 157* 84*   No results for input(s): "AMMONIA" in the last 168 hours. Coagulation Profile: No results for input(s): "INR", "PROTIME" in the last 168 hours. CBC: Recent Labs  Lab 09/08/21 1012 09/09/21 0449 09/10/21 0631  WBC 9.0 6.7 8.4  NEUTROABS 6.7  --  6.0  HGB 15.4 13.1 14.0  HCT 47.8 41.6 45.7  MCV 87.7 90.4 91.2  PLT 263 203 229   Cardiac Enzymes: No results for input(s): "CKTOTAL", "CKMB", "CKMBINDEX", "TROPONINI" in the last 168 hours. BNP: Invalid input(s): "POCBNP" CBG: Recent Labs  Lab 09/10/21 1111 09/10/21 1330 09/10/21 1519  GLUCAP 237* 120* 85   HbA1C: No results for input(s):  "HGBA1C" in the last 72 hours. Urine analysis:    Component Value Date/Time   COLORURINE YELLOW 07/28/2021 1501   APPEARANCEUR HAZY (A) 07/28/2021 1501   LABSPEC 1.009 07/28/2021 1501   PHURINE 5.0 07/28/2021 1501   GLUCOSEU NEGATIVE 07/28/2021 1501   HGBUR NEGATIVE 07/28/2021 1501   BILIRUBINUR NEGATIVE 07/28/2021 1501   KETONESUR NEGATIVE 07/28/2021 1501   PROTEINUR NEGATIVE 07/28/2021 1501   UROBILINOGEN 0.2 04/23/2013 2034   NITRITE NEGATIVE 07/28/2021 1501   LEUKOCYTESUR MODERATE (A) 07/28/2021 1501   Sepsis Labs: '@LABRCNTIP'$ (procalcitonin:4,lacticidven:4) )No results found for this or any previous visit (from the past 240 hour(s)).   Scheduled Meds:  dorzolamide-timolol  1 drop Both Eyes BID   feeding supplement  237 mL Oral BID BM   gabapentin  600 mg Oral BID   heparin injection (subcutaneous)  5,000 Units Subcutaneous Q8H   latanoprost  1 drop Both Eyes QHS   lipase/protease/amylase  36,000 Units Oral TID WC   midodrine  10 mg Oral TID WC   pantoprazole (PROTONIX) IV  40 mg Intravenous QHS   Continuous Infusions:  sodium chloride 50 mL/hr at 09/10/21 1731    Procedures/Studies: CT HEAD WO CONTRAST (5MM)  Result Date: 09/10/2021 CLINICAL DATA:  Dizziness, dehydration or hypotension. Pancreatitis. EXAM: CT HEAD WITHOUT CONTRAST TECHNIQUE: Contiguous axial images were obtained from the base of the skull through the vertex without intravenous contrast. RADIATION DOSE REDUCTION: This exam was performed according to the departmental dose-optimization program which includes automated exposure control, adjustment of the mA and/or kV according to patient size and/or use of iterative reconstruction technique. COMPARISON:  None Available. FINDINGS: Brain: Mild atrophy and white matter changes are present. No acute infarct, hemorrhage, or mass lesion is present. Basal ganglia are intact. Insular ribbon is normal bilaterally. No acute or focal cortical abnormalities are present. The  brainstem and cerebellum are within normal limits. Vascular: Atherosclerotic calcifications are present within the cavernous internal carotid arteries. No hyperdense vessel is present. Skull: Calvarium is intact. No focal lytic or blastic lesions are present. No significant extracranial soft tissue lesion is present. Sinuses/Orbits: The paranasal sinuses and mastoid air cells are clear. The globes and orbits are within normal limits. IMPRESSION: 1. No acute intracranial abnormality. 2. Mild atrophy and white matter disease likely reflects the sequela of chronic microvascular ischemia. Electronically Signed   By: San Morelle M.D.   On: 09/10/2021 18:37   DG Chest Port 1 View  Result Date: 09/10/2021 CLINICAL DATA:  Dyspnea.  History of hypertension. EXAM: PORTABLE CHEST 1 VIEW COMPARISON:  09/08/2021 and older studies.  Chest CT, 06/28/2021. FINDINGS: Stable enlargement of the cardiac silhouette. No mediastinal or hilar masses. Stable interstitial prominence bilaterally. No evidence  of pneumonia or pulmonary edema. No convincing pleural effusion or pneumothorax. Skeletal structures are grossly intact. IMPRESSION: No acute cardiopulmonary disease. Electronically Signed   By: Lajean Manes M.D.   On: 09/10/2021 16:17   CT ABDOMEN PELVIS W CONTRAST  Result Date: 09/08/2021 CLINICAL DATA:  Left upper quadrant abdominal pain. Recent diagnosis of pancreatitis. EXAM: CT ABDOMEN AND PELVIS WITH CONTRAST TECHNIQUE: Multidetector CT imaging of the abdomen and pelvis was performed using the standard protocol following bolus administration of intravenous contrast. RADIATION DOSE REDUCTION: This exam was performed according to the departmental dose-optimization program which includes automated exposure control, adjustment of the mA and/or kV according to patient size and/or use of iterative reconstruction technique. CONTRAST:  134m OMNIPAQUE IOHEXOL 300 MG/ML  SOLN COMPARISON:  June 28, 2021 FINDINGS: Lower  chest: Minimal bibasilar pleural thickening and subpleural scarring. Enlarged heart. Hepatobiliary: No focal liver abnormality is seen. No gallstones, gallbladder wall thickening, or biliary dilatation. Pancreas: Diffuse dilation of the main pancreatic duct with overall atrophic pancreatic parenchyma. Coarse calcifications within the head of the pancreas. Spleen: Normal in size without focal abnormality. Adrenals/Urinary Tract: Left upper pole renal lesion, too small to be actually characterized by CT. No evidence of hydronephrosis or hydroureter. Nonobstructive Large 1.7 cm right lower pole renal calculus. Stomach/Bowel: Again seen is tubular device connecting the stomach with the underlying jejunal segment. Persistent indistinctness/edema of the second and third portion of the duodenum. Vascular/Lymphatic: Aortic atherosclerosis. No enlarged abdominal or pelvic lymph nodes. Reproductive: Coarse calcifications of the prostate gland. Other: Mild mesenteric edema in the upper abdomen. Musculoskeletal: Advanced lytic and sclerotic changes of the left proximal femur. 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 IMPRESSION: 1. Diffuse dilation of the main pancreatic duct with overall atrophic pancreatic parenchyma. Coarse calcifications within the head of the pancreas. These findings may represent sequela of chronic pancreatitis. Pancreatic mass in the head of the pancreas cannot be entirely excluded. 2. Persistent indistinctness/edema of the second and third portion of the duodenum, which may be due to duodenitis or infiltrative changes. Correlation to endoscopy results is recommended. 3. Nonobstructive right renal calculus. 4. Advanced lytic and sclerotic changes of the left proximal femur, which may be due to infectious/inflammatory etiology, posttraumatic changes. 5. 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 Aortic Atherosclerosis (ICD10-I70.0). Electronically Signed   By: DFidela SalisburyM.D.    On: 09/08/2021 15:24   DG Chest Port 1 View  Result Date: 09/08/2021 CLINICAL DATA:  Chest pain EXAM: PORTABLE CHEST 1 VIEW COMPARISON:  08/26/2018 FINDINGS: Transverse diameter of heart is increased. There are no signs of pulmonary edema or focal pulmonary consolidation. Small linear densities seen in the right lower lung fields. There is no significant pleural effusion or pneumothorax. IMPRESSION: Cardiomegaly. There are no signs of pulmonary edema or focal pulmonary consolidation. Small linear densities in the right lower lung fields suggest scarring or subsegmental atelectasis. Electronically Signed   By: PElmer PickerM.D.   On: 09/08/2021 10:28   DG Chest 1 View  Result Date: 08/25/2021 CLINICAL DATA:  Provided history: Shortness of breath, leg swelling, dizziness. History of heart failure. EXAM: CHEST  1 VIEW COMPARISON:  Prior chest radiographs 07/28/2021 and earlier. FINDINGS: The patient's chin partially obscures the medial lung apices, bilaterally. Cardiomegaly. Aortic atherosclerosis. Mild ill-defined opacity right lung base, favored to reflect atelectasis. No evidence of pleural effusion or pneumothorax. No acute bony abnormality identified. IMPRESSION: The patient's chin partially obscures the medial lung apices, bilaterally. Cardiomegaly. Mild ill-defined opacity  within the right lung base, favored to reflect atelectasis. Aortic Atherosclerosis (ICD10-I70.0). Electronically Signed   By: Kellie Simmering D.O.   On: 08/25/2021 13:34    Orson Eva, DO  [facility]  If 7PM-7AM, please contact night-coverage [email or web address] Password [email or web address] 09/11/2021, 9:03 AM   LOS: 2 days

## 2021-09-11 NOTE — Plan of Care (Signed)
  Problem: Education: Goal: Knowledge of General Education information will improve Description Including pain rating scale, medication(s)/side effects and non-pharmacologic comfort measures Outcome: Progressing   Problem: Health Behavior/Discharge Planning: Goal: Ability to manage health-related needs will improve Outcome: Progressing   

## 2021-09-11 NOTE — Assessment & Plan Note (Addendum)
Presented with lipase 157>>84 -Overall abdominal pain is slowly improving -Still continues to have intermittent dry heaving -Appreciate GI consult and follow-up -09/08/2021 CT abd--diffuse dilatation of main pancreatic duct with overall atrophy of pancreatic parenchyma.  Persistent indistinct deafness and edema of the second and third portion of the duodenum= duodenitis -Continue clear liquids>>soft diet 7/3>>having increase pain with soft diet -Continue PPI twice daily -Continue Creon -Patient has been sober for nearly 9 months -Changing dilaudid to morphine per patient request -seems to be doing better with xanax than IV opioids>>d/c IV dilaudid

## 2021-09-11 NOTE — Progress Notes (Signed)
Pt lying in bed watching TV. States nausea relieved after med admin'd. States only ate a few bites of his lunch. Pt c/o mid/upper abd pain, requesting pain medication at this time. IVF infusing without s/s infiltration. Voiding clear yellow urine. Call bell within reach, advised to call for any further needs.

## 2021-09-12 DIAGNOSIS — F17200 Nicotine dependence, unspecified, uncomplicated: Secondary | ICD-10-CM

## 2021-09-12 MED ORDER — ALPRAZOLAM 0.25 MG PO TABS
0.2500 mg | ORAL_TABLET | Freq: Once | ORAL | Status: AC
  Administered 2021-09-12: 0.25 mg via ORAL
  Filled 2021-09-12: qty 1

## 2021-09-12 MED ORDER — ONDANSETRON HCL 4 MG PO TABS
4.0000 mg | ORAL_TABLET | Freq: Three times a day (TID) | ORAL | Status: DC
  Administered 2021-09-12 – 2021-09-14 (×6): 4 mg via ORAL
  Filled 2021-09-12 (×6): qty 1

## 2021-09-12 NOTE — Progress Notes (Signed)
Patient attempted to eat breakfast this morning, patient began feeling nauseous, gave prn Zofran refer to mar for dose and frequency. Will  monitor throughout shift.

## 2021-09-12 NOTE — Progress Notes (Signed)
PROGRESS NOTE  Ryan [name] KAJ:[id number] DOB: [birth year] DOA: 09/08/2021 PCP: [facility]  Brief History:  71 y.o. male with medical history significant for systolic and diastolic CHF, chronic bronchitis, alcohol abuse, COPD, chronic chest pain, orthostatic hypotension. Patient presented to the ED with complaints of upper abdominal pain of 2 days duration.  He reports symptoms today with abdominal pain in his upper abdomen that radiated up into his chest and his left arm.  Reports pain is similar to his prior pancreatitis pain.  Reports occasional dizziness that is unchanged.  Reports chronic difficulty breathing, dyspnea on exertion is also unchanged. Patient states that he has been sober from alcohol for nearly 9 months. He denies any fevers, chills, chest pain, coughing, hemoptysis, vomiting, diarrhea, hematochezia, melena. ED Course: Tmax 98.6.  Heart rate 94-107.  Respiratory rate 15-25.  Blood pressure systolic 035-597.  Sats between 96% on room air.  Lipase 157.  Troponin 11 X 2.  Chest x-ray shows scarring or atelectasis.  CT abdomen and pelvis-shows chronic pancreatitis, dilatation of the main pancreatic duct, pancreatic mass and the head of the pancreas not excluded.  Also suggest duodenitis or infiltrative changes in the second and third portion of duodenum. 4 mg IV morphine given without significant improvement in patient's pain. Hospitalist to admit for acute pancreatitis. Recent hospitalization 6/15 to 6/22 for acute on chronic systolic and diastolic CHF also with AKI on CKD.  Patient was discharged with torsemide 60 mg daily.  Discharge weight was 74.7 kg.  GI was consulted to assist with management.  The patient was started on judicious IV fluids initially.  He was initially n.p.o. with bowel rest and judicious IV opioids.  His improvement has been slow.  He has been advanced to clear liquids.  09/10/2021--patient developed some dizziness with transient  hypotension with systolic in the upper 41U LR bolus given.  CT brain negative.  09/12/2021--having difficulty tolerating soft diet--complains of abd pain without frank vomiting.  Having some "spit up"  Had BM.  No cp, no sob.  Requesting something "lighter" than dilaudid for pain    Assessment and Plan: * Acute on chronic pancreatitis ([place]) Presented with lipase 157>>84 -Overall abdominal pain is slowly improving -Still continues to have intermittent dry heaving -Appreciate GI consult and follow-up -09/08/2021 CT abd--diffuse dilatation of main pancreatic duct with overall atrophy of pancreatic parenchyma.  Persistent indistinct deafness and edema of the second and third portion of the duodenum= duodenitis -Continue clear liquids>>soft diet 7/3>>having increase pain with soft diet -Continue PPI twice daily -Continue Creon -Patient has been sober for nearly 9 months -Changing dilaudid to morphine per patient request  Dilation of pancreatic duct CT/CT shows diffuse dilatation of the main pancreatic duct.  Consideration for pancreatic mass in the head of the pancreas.  Also possible duodenitis or infiltrative changes.   - Per care everywhere 04/2021-at [facility] clinic, per general surgery notes.  A follow-up MRI Abdomen with, without contrast completed on November 29, 2020 confirmed CBD dilation to 8 mm and common hepatic artery dilation to 1.5 cm. The main pancreas duct was dilated to the pancreas head with atrophy of the body and tail. An EUS and ERCP were completed on January 30, 2021. A significant amount of retained gastric contents limited the procedure. Groove pancreatitis was noted. A CT of the Abdomen completed on March 01, 2021 did reveal narrowing of the portal vein confluence along with peri-pancreatic stranding. Concerns for  an impending gastric outlet obstruction was noted. An endoscopic gastroduodenostomy with an axios stent was therefore completed for a duodenal stricture. His most  recent CT of the Abdomen completed on April 21, 2021 revealed ongoing inflammation within the pancreas head. Although no obvious mass is seen on this scan, significant portal vein narrowing at the confluence is still noted.   they felt the imaging findings are concerning for an occult malignancy of the pancreas and but Dr. Marrion Coy was not certain whtether an attempt at an EUS to blindly biopsy the parenchymal tissus is possbie if a mass is not evident. He obtain CA 19-9 tumor marker and stated that if diagnosed with pancreatic malignancy that he felt as though successful operative intervention would be unlikely given his cardiac history with a EF of 15 to 20% along with ominous venous compromise on imaging.  He advised possible palliative radiation with systemic treatment if diagnosis confirmed    --GI following  Chronic HFrEF (heart failure with reduced ejection fraction) (HCC) Stable and compensated.  Recent prolonged hospitalization for same.  -Hold torsemide 60 mg daily for now as pt was hypotensive with dizziness 7/1 and very little po intake -Gentle hydration initially - Echo (5/23) EF < 20%, G3DD RV severely reduced, RVSP 46 mmHg, moderate MR - Has been off entresto and spiro with low BP  COPD (chronic obstructive pulmonary disease) (HCC) Stable on RA -Resume home bronchodilator regimen  Orthostatic hypotension Continue midodrine 10 mg 3 times daily Place TED hose Use abdominal binder if patient is able to tolerate  Chronic kidney disease, stage 3a (HCC) Baseline creatinine 1.1-1.4 Monitor serial BMP  Tobacco use disorder Cessation discussed Continues to smoke few cigarettes per day       Family Communication: no  Family at bedside   Consultants:  GI   Code Status:  FULL    DVT Prophylaxis:  [place] Heparin     Procedures: As Listed in Progress Note Above   Antibiotics: None            Subjective: Patient having pain with soft diet. Denies cp, sob,  vomiting, diarrhea.  Had BM today.  No f/c  Objective: Vitals:   09/11/21 1212 09/11/21 2115 09/12/21 0532 09/12/21 1300  BP: 106/82 92/65 100/78 102/75  Pulse: 92 80 87 87  Resp: '18 20 18 18  '$ Temp: (!) 97.5 F (36.4 C) 97.6 F (36.4 C) 98.5 F (36.9 C) 98.8 F (37.1 C)  TempSrc: Oral Oral Oral Axillary  SpO2: 100% 100% 100% 91%  Weight:      Height:        Intake/Output Summary (Last 24 hours) at 09/12/2021 1744 Last data filed at 09/12/2021 1500 Gross per 24 hour  Intake 2271.93 ml  Output 600 ml  Net 1671.93 ml   Weight change:  Exam:  General:  Pt is alert, follows commands appropriately, not in acute distress HEENT: No icterus, No thrush, No neck mass, [place]/AT Cardiovascular: RRR, S1/S2, no rubs, no gallops Respiratory: CTA bilaterally, no wheezing, no crackles, no rhonchi Abdomen: Soft/+BS, upper abd tender, non distended, no guarding Extremities: No edema, No lymphangitis, No petechiae, No rashes, no synovitis   Data Reviewed: I have personally reviewed following labs and imaging studies Basic Metabolic Panel: Recent Labs  Lab 09/08/21 1102 09/09/21 0449 09/10/21 0631  NA 136 136 135  K 3.6 4.5 4.0  CL 101 106 104  CO2 '29 23 25  '$ GLUCOSE 114* 121* 99  BUN '14 14 15  '$ CREATININE  1.04 1.09 1.05  CALCIUM 9.9 8.8* 9.4   Liver Function Tests: Recent Labs  Lab 09/08/21 1102 09/10/21 0631  AST 30 25  ALT 29 21  ALKPHOS 128* 126  BILITOT 0.9 0.7  PROT 7.1 7.6  ALBUMIN 3.6 3.8   Recent Labs  Lab 09/08/21 1102 09/10/21 0631  LIPASE 157* 84*   No results for input(s): "AMMONIA" in the last 168 hours. Coagulation Profile: No results for input(s): "INR", "PROTIME" in the last 168 hours. CBC: Recent Labs  Lab 09/08/21 1012 09/09/21 0449 09/10/21 0631  WBC 9.0 6.7 8.4  NEUTROABS 6.7  --  6.0  HGB 15.4 13.1 14.0  HCT 47.8 41.6 45.7  MCV 87.7 90.4 91.2  PLT 263 203 229   Cardiac Enzymes: No results for input(s): "CKTOTAL", "CKMB", "CKMBINDEX",  "TROPONINI" in the last 168 hours. BNP: Invalid input(s): "POCBNP" CBG: Recent Labs  Lab 09/10/21 1111 09/10/21 1330 09/10/21 1519  GLUCAP 237* 120* 85   HbA1C: No results for input(s): "HGBA1C" in the last 72 hours. Urine analysis:    Component Value Date/Time   COLORURINE YELLOW 07/28/2021 1501   APPEARANCEUR HAZY (A) 07/28/2021 1501   LABSPEC 1.009 07/28/2021 1501   PHURINE 5.0 07/28/2021 1501   GLUCOSEU NEGATIVE 07/28/2021 1501   HGBUR NEGATIVE 07/28/2021 1501   BILIRUBINUR NEGATIVE 07/28/2021 1501   KETONESUR NEGATIVE 07/28/2021 1501   PROTEINUR NEGATIVE 07/28/2021 1501   UROBILINOGEN 0.2 04/23/2013 2034   NITRITE NEGATIVE 07/28/2021 1501   LEUKOCYTESUR MODERATE (A) 07/28/2021 1501   Sepsis Labs: '@LABRCNTIP'$ (procalcitonin:4,lacticidven:4) )No results found for this or any previous visit (from the past 240 hour(s)).   Scheduled Meds:  dorzolamide-timolol  1 drop Both Eyes BID   feeding supplement  237 mL Oral BID BM   gabapentin  600 mg Oral BID   heparin injection (subcutaneous)  5,000 Units Subcutaneous Q8H   latanoprost  1 drop Both Eyes QHS   lipase/protease/amylase  36,000 Units Oral TID WC   midodrine  10 mg Oral TID WC   ondansetron  4 mg Oral TID AC   pantoprazole (PROTONIX) IV  40 mg Intravenous QHS   Continuous Infusions:  sodium chloride 50 mL/hr at 09/12/21 0445    Procedures/Studies: CT HEAD WO CONTRAST (5MM)  Result Date: 09/10/2021 CLINICAL DATA:  Dizziness, dehydration or hypotension. Pancreatitis. EXAM: CT HEAD WITHOUT CONTRAST TECHNIQUE: Contiguous axial images were obtained from the base of the skull through the vertex without intravenous contrast. RADIATION DOSE REDUCTION: This exam was performed according to the departmental dose-optimization program which includes automated exposure control, adjustment of the mA and/or kV according to patient size and/or use of iterative reconstruction technique. COMPARISON:  None Available. FINDINGS: Brain:  Mild atrophy and white matter changes are present. No acute infarct, hemorrhage, or mass lesion is present. Basal ganglia are intact. Insular ribbon is normal bilaterally. No acute or focal cortical abnormalities are present. The brainstem and cerebellum are within normal limits. Vascular: Atherosclerotic calcifications are present within the cavernous internal carotid arteries. No hyperdense vessel is present. Skull: Calvarium is intact. No focal lytic or blastic lesions are present. No significant extracranial soft tissue lesion is present. Sinuses/Orbits: The paranasal sinuses and mastoid air cells are clear. The globes and orbits are within normal limits. IMPRESSION: 1. No acute intracranial abnormality. 2. Mild atrophy and white matter disease likely reflects the sequela of chronic microvascular ischemia. Electronically Signed   By: San Morelle M.D.   On: 09/10/2021 18:37   DG Chest [facility]  Result Date: 09/10/2021 CLINICAL DATA:  Dyspnea.  History of hypertension. EXAM: PORTABLE CHEST 1 VIEW COMPARISON:  09/08/2021 and older studies.  Chest CT, 06/28/2021. FINDINGS: Stable enlargement of the cardiac silhouette. No mediastinal or hilar masses. Stable interstitial prominence bilaterally. No evidence of pneumonia or pulmonary edema. No convincing pleural effusion or pneumothorax. Skeletal structures are grossly intact. IMPRESSION: No acute cardiopulmonary disease. Electronically Signed   By: Lajean Manes M.D.   On: 09/10/2021 16:17   CT ABDOMEN PELVIS W CONTRAST  Result Date: 09/08/2021 CLINICAL DATA:  Left upper quadrant abdominal pain. Recent diagnosis of pancreatitis. EXAM: CT ABDOMEN AND PELVIS WITH CONTRAST TECHNIQUE: Multidetector CT imaging of the abdomen and pelvis was performed using the standard protocol following bolus administration of intravenous contrast. RADIATION DOSE REDUCTION: This exam was performed according to the departmental dose-optimization program which includes  automated exposure control, adjustment of the mA and/or kV according to patient size and/or use of iterative reconstruction technique. CONTRAST:  182m OMNIPAQUE IOHEXOL 300 MG/ML  SOLN COMPARISON:  June 28, 2021 FINDINGS: Lower chest: Minimal bibasilar pleural thickening and subpleural scarring. Enlarged heart. Hepatobiliary: No focal liver abnormality is seen. No gallstones, gallbladder wall thickening, or biliary dilatation. Pancreas: Diffuse dilation of the main pancreatic duct with overall atrophic pancreatic parenchyma. Coarse calcifications within the head of the pancreas. Spleen: Normal in size without focal abnormality. Adrenals/Urinary Tract: Left upper pole renal lesion, too small to be actually characterized by CT. No evidence of hydronephrosis or hydroureter. Nonobstructive Large 1.7 cm right lower pole renal calculus. Stomach/Bowel: Again seen is tubular device connecting the stomach with the underlying jejunal segment. Persistent indistinctness/edema of the second and third portion of the duodenum. Vascular/Lymphatic: Aortic atherosclerosis. No enlarged abdominal or pelvic lymph nodes. Reproductive: Coarse calcifications of the prostate gland. Other: Mild mesenteric edema in the upper abdomen. Musculoskeletal: Advanced lytic and sclerotic changes of the left proximal femur. 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 IMPRESSION: 1. Diffuse dilation of the main pancreatic duct with overall atrophic pancreatic parenchyma. Coarse calcifications within the head of the pancreas. These findings may represent sequela of chronic pancreatitis. Pancreatic mass in the head of the pancreas cannot be entirely excluded. 2. Persistent indistinctness/edema of the second and third portion of the duodenum, which may be due to duodenitis or infiltrative changes. Correlation to endoscopy results is recommended. 3. Nonobstructive right renal calculus. 4. Advanced lytic and sclerotic changes of the left proximal  femur, which may be due to infectious/inflammatory etiology, posttraumatic changes. 5. 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 Aortic Atherosclerosis (ICD10-I70.0). Electronically Signed   By: DFidela SalisburyM.D.   On: 09/08/2021 15:24   DG Chest Port 1 View  Result Date: 09/08/2021 CLINICAL DATA:  Chest pain EXAM: PORTABLE CHEST 1 VIEW COMPARISON:  08/26/2018 FINDINGS: Transverse diameter of heart is increased. There are no signs of pulmonary edema or focal pulmonary consolidation. Small linear densities seen in the right lower lung fields. There is no significant pleural effusion or pneumothorax. IMPRESSION: Cardiomegaly. There are no signs of pulmonary edema or focal pulmonary consolidation. Small linear densities in the right lower lung fields suggest scarring or subsegmental atelectasis. Electronically Signed   By: PElmer PickerM.D.   On: 09/08/2021 10:28   DG Chest 1 View  Result Date: 08/25/2021 CLINICAL DATA:  Provided history: Shortness of breath, leg swelling, dizziness. History of heart failure. EXAM: CHEST  1 VIEW COMPARISON:  Prior chest radiographs 07/28/2021 and earlier. FINDINGS: The patient's chin partially obscures the  medial lung apices, bilaterally. Cardiomegaly. Aortic atherosclerosis. Mild ill-defined opacity right lung base, favored to reflect atelectasis. No evidence of pleural effusion or pneumothorax. No acute bony abnormality identified. IMPRESSION: The patient's chin partially obscures the medial lung apices, bilaterally. Cardiomegaly. Mild ill-defined opacity within the right lung base, favored to reflect atelectasis. Aortic Atherosclerosis (ICD10-I70.0). Electronically Signed   By: Kellie Simmering D.O.   On: 08/25/2021 13:34    Orson Eva, DO  [facility]  If 7PM-7AM, please contact night-coverage [email or web address] Password [email or web address] 09/12/2021, 5:44 PM   LOS: 3 days

## 2021-09-12 NOTE — Progress Notes (Signed)
Gastroenterology Progress Note    Primary Care Physician:  [facility] [place] Primary Gastroenterologist:  [facility]   Patient ID: Ryan [name]; [id number]; 1950-07-26    Subjective   Nausea this morning . Worsened when starting soft diet. No vomiting. Unable to eat dinner last night due to nausea. Abdominal pain improved. States his allergies are bothering him.    Objective   Vital signs in last 24 hours Temp:  [97.5 F (36.4 C)-98.5 F (36.9 C)] 98.5 F (36.9 C) (07/03 0532) Pulse Rate:  [80-92] 87 (07/03 0532) Resp:  [18-20] 18 (07/03 0532) BP: (92-106)/(65-82) 100/78 (07/03 0532) SpO2:  [100 %] 100 % (07/03 0532) Last BM Date : 09/11/21  Physical Exam General:   Alert and oriented, pleasant Head:  Normocephalic and atraumatic. Eyes:  No icterus, sclera clear. Conjuctiva pink.  Abdomen:  Bowel sounds present, soft, mild TTP epigastric, non-distended.  Neurologic:  Alert and  oriented x4    Intake/Output from previous day: 07/02 0701 - 07/03 0700 In: 2563.8 [P.O.:820; I.V.:1743.8] Out: 175 [Urine:175] Intake/Output this shift: No intake/output data recorded.  Lab Results  Recent Labs    09/10/21 0631  WBC 8.4  HGB 14.0  HCT 45.7  PLT 229   BMET Recent Labs    09/10/21 0631  NA 135  K 4.0  CL 104  CO2 25  GLUCOSE 99  BUN 15  CREATININE 1.05  CALCIUM 9.4   LFT Recent Labs    09/10/21 0631  PROT 7.6  ALBUMIN 3.8  AST 25  ALT 21  ALKPHOS 126  BILITOT 0.7   Lab Results  Component Value Date   LIPASE 84 (H) 09/10/2021    Studies/Results CT HEAD WO CONTRAST (5MM)  Result Date: 09/10/2021 CLINICAL DATA:  Dizziness, dehydration or hypotension. Pancreatitis. EXAM: CT HEAD WITHOUT CONTRAST TECHNIQUE: Contiguous axial images were obtained from the base of the skull through the vertex without intravenous contrast. RADIATION DOSE REDUCTION: This exam was performed according to the departmental dose-optimization program which  includes automated exposure control, adjustment of the mA and/or kV according to patient size and/or use of iterative reconstruction technique. COMPARISON:  None Available. FINDINGS: Brain: Mild atrophy and white matter changes are present. No acute infarct, hemorrhage, or mass lesion is present. Basal ganglia are intact. Insular ribbon is normal bilaterally. No acute or focal cortical abnormalities are present. The brainstem and cerebellum are within normal limits. Vascular: Atherosclerotic calcifications are present within the cavernous internal carotid arteries. No hyperdense vessel is present. Skull: Calvarium is intact. No focal lytic or blastic lesions are present. No significant extracranial soft tissue lesion is present. Sinuses/Orbits: The paranasal sinuses and mastoid air cells are clear. The globes and orbits are within normal limits. IMPRESSION: 1. No acute intracranial abnormality. 2. Mild atrophy and white matter disease likely reflects the sequela of chronic microvascular ischemia. Electronically Signed   By: San Morelle M.D.   On: 09/10/2021 18:37   DG Chest Port 1 View  Result Date: 09/10/2021 CLINICAL DATA:  Dyspnea.  History of hypertension. EXAM: PORTABLE CHEST 1 VIEW COMPARISON:  09/08/2021 and older studies.  Chest CT, 06/28/2021. FINDINGS: Stable enlargement of the cardiac silhouette. No mediastinal or hilar masses. Stable interstitial prominence bilaterally. No evidence of pneumonia or pulmonary edema. No convincing pleural effusion or pneumothorax. Skeletal structures are grossly intact. IMPRESSION: No acute cardiopulmonary disease. Electronically Signed   By: Lajean Manes M.D.   On: 09/10/2021 16:17   CT ABDOMEN PELVIS W  CONTRAST  Result Date: 09/08/2021 CLINICAL DATA:  Left upper quadrant abdominal pain. Recent diagnosis of pancreatitis. EXAM: CT ABDOMEN AND PELVIS WITH CONTRAST TECHNIQUE: Multidetector CT imaging of the abdomen and pelvis was performed using the standard  protocol following bolus administration of intravenous contrast. RADIATION DOSE REDUCTION: This exam was performed according to the departmental dose-optimization program which includes automated exposure control, adjustment of the mA and/or kV according to patient size and/or use of iterative reconstruction technique. CONTRAST:  185m OMNIPAQUE IOHEXOL 300 MG/ML  SOLN COMPARISON:  June 28, 2021 FINDINGS: Lower chest: Minimal bibasilar pleural thickening and subpleural scarring. Enlarged heart. Hepatobiliary: No focal liver abnormality is seen. No gallstones, gallbladder wall thickening, or biliary dilatation. Pancreas: Diffuse dilation of the main pancreatic duct with overall atrophic pancreatic parenchyma. Coarse calcifications within the head of the pancreas. Spleen: Normal in size without focal abnormality. Adrenals/Urinary Tract: Left upper pole renal lesion, too small to be actually characterized by CT. No evidence of hydronephrosis or hydroureter. Nonobstructive Large 1.7 cm right lower pole renal calculus. Stomach/Bowel: Again seen is tubular device connecting the stomach with the underlying jejunal segment. Persistent indistinctness/edema of the second and third portion of the duodenum. Vascular/Lymphatic: Aortic atherosclerosis. No enlarged abdominal or pelvic lymph nodes. Reproductive: Coarse calcifications of the prostate gland. Other: Mild mesenteric edema in the upper abdomen. Musculoskeletal: Advanced lytic and sclerotic changes of the left proximal femur. 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 IMPRESSION: 1. Diffuse dilation of the main pancreatic duct with overall atrophic pancreatic parenchyma. Coarse calcifications within the head of the pancreas. These findings may represent sequela of chronic pancreatitis. Pancreatic mass in the head of the pancreas cannot be entirely excluded. 2. Persistent indistinctness/edema of the second and third portion of the duodenum, which may be due to  duodenitis or infiltrative changes. Correlation to endoscopy results is recommended. 3. Nonobstructive right renal calculus. 4. Advanced lytic and sclerotic changes of the left proximal femur, which may be due to infectious/inflammatory etiology, posttraumatic changes. 5. 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 Aortic Atherosclerosis (ICD10-I70.0). Electronically Signed   By: DFidela SalisburyM.D.   On: 09/08/2021 15:24   DG Chest Port 1 View  Result Date: 09/08/2021 CLINICAL DATA:  Chest pain EXAM: PORTABLE CHEST 1 VIEW COMPARISON:  08/26/2018 FINDINGS: Transverse diameter of heart is increased. There are no signs of pulmonary edema or focal pulmonary consolidation. Small linear densities seen in the right lower lung fields. There is no significant pleural effusion or pneumothorax. IMPRESSION: Cardiomegaly. There are no signs of pulmonary edema or focal pulmonary consolidation. Small linear densities in the right lower lung fields suggest scarring or subsegmental atelectasis. Electronically Signed   By: PElmer PickerM.D.   On: 09/08/2021 10:28   DG Chest 1 View  Result Date: 08/25/2021 CLINICAL DATA:  Provided history: Shortness of breath, leg swelling, dizziness. History of heart failure. EXAM: CHEST  1 VIEW COMPARISON:  Prior chest radiographs 07/28/2021 and earlier. FINDINGS: The patient's chin partially obscures the medial lung apices, bilaterally. Cardiomegaly. Aortic atherosclerosis. Mild ill-defined opacity right lung base, favored to reflect atelectasis. No evidence of pleural effusion or pneumothorax. No acute bony abnormality identified. IMPRESSION: The patient's chin partially obscures the medial lung apices, bilaterally. Cardiomegaly. Mild ill-defined opacity within the right lung base, favored to reflect atelectasis. Aortic Atherosclerosis (ICD10-I70.0). Electronically Signed   By: KKellie SimmeringD.O.   On: 08/25/2021 13:34    Assessment  70y.o. male with a history of  heart failure,  alcohol abuse, COPD, HTN, chronic pancreatitis, presenting with acute on chronic pancreatitis. CT imaging with diffuse dilation of main pancreatic duct, coarse pancreatic calcifications, unable to exclude pancreatic head mass, persistent edema of duodenum raisin concerns for duodenitis.   Acute on chronic pancreatitis: evaluation as outlined in consult note dated 6/30. Abdominal pain improving but noting nausea with advancement to soft diet. No vomiting. He desires to remain on soft diet and eager to go home. Recommend Zofran before meals.  Duodenitis: PPI BID.     Plan / Recommendations  Zofran before meals If continues to improve today, appropriate for discharge Continue Creon with meals  PPI BID Will need to follow-up with primary GI as outpatient and could benefit from EUS.  Repeat dedicated imaging of pancreas in 8-12 weeks    LOS: 3 days    09/12/2021, 9:59 AM  Annitta Needs, PhD, [facility] Gastroenterology

## 2021-09-12 NOTE — TOC Initial Note (Signed)
Transition of Care [facility]) - Initial/Assessment Note    Patient Details  Name: Ryan [name] MRN: [id number] Date of Birth: February 16, 1951  [facility]) CM/SW Contact:    Shade Flood, LCSW Phone Number: 09/12/2021, 11:17 AM  Clinical Narrative:                  Pt admitted from home. He has a high readmission risk score. Spoke with pt today to assess. Pt reports that he lives with family at home. He states that he is independent in ADLs at home. Pt has a cane for ambulation. Per pt, he does not drive. His family assists with transportation to appointments and pt reports he is able to obtain his medications as needed.   MD anticipating dc home tomorrow. Pt is not expecting any new TOC needs for dc. TOC will follow and assist if needed.  Expected Discharge Plan: Home/Self Care Barriers to Discharge: Continued Medical Work up   Patient Goals and CMS Choice Patient states their goals for this hospitalization and ongoing recovery are:: go home      Expected Discharge Plan and Services Expected Discharge Plan: Home/Self Care In-house Referral: Clinical Social Work     Living arrangements for the past 2 months: Single Family Home                                      Prior Living Arrangements/Services Living arrangements for the past 2 months: Single Family Home Lives with:: Adult Children Patient language and need for interpreter reviewed:: Yes Do you feel safe going back to the place where you live?: Yes      Need for Family Participation in Patient Care: Yes (Comment) Care giver support system in place?: Yes (comment) Current home services: DME Criminal Activity/Legal Involvement Pertinent to Current Situation/Hospitalization: No - Comment as needed  Activities of Daily Living Home Assistive Devices/Equipment: Cane (specify quad or straight) ADL Screening (condition at time of admission) Patient's cognitive ability adequate to safely complete daily  activities?: Yes Is the patient deaf or have difficulty hearing?: No Does the patient have difficulty seeing, even when wearing glasses/contacts?: No Does the patient have difficulty concentrating, remembering, or making decisions?: No Patient able to express need for assistance with ADLs?: Yes Does the patient have difficulty dressing or bathing?: No Independently performs ADLs?: Yes (appropriate for developmental age) Does the patient have difficulty walking or climbing stairs?: Yes Weakness of Legs: Both Weakness of Arms/Hands: None  Permission Sought/Granted                  Emotional Assessment Appearance:: Appears stated age Attitude/Demeanor/Rapport: Engaged Affect (typically observed): Pleasant Orientation: : Oriented to Self, Oriented to Place, Oriented to  Time, Oriented to Situation Alcohol / Substance Use: Not Applicable Psych Involvement: No (comment)  Admission diagnosis:  Abdominal pain [R10.9] Acute pancreatitis without infection or necrosis, unspecified pancreatitis type [K85.90] Acute pancreatitis [K85.90] Patient Active Problem List   Diagnosis Date Noted   Chronic kidney disease, stage 3a ([place]) 09/11/2021   Orthostatic hypotension    AKI (acute kidney injury) ([place]) 08/26/2021   Acute on chronic combined systolic (congestive) and diastolic (congestive) heart failure ([place]) 08/25/2021   Pulmonary hypertension, unspecified ([place]) 08/04/2021   NSVT (nonsustained ventricular tachycardia) ([place])    Enteritis 06/28/2021   Constipation    Dilation of biliary tract    Dilation of pancreatic duct  Anemia    Pain    Pancreatic pseudocyst    Alcohol abuse    Acute on chronic pancreatitis ([place]) 10/24/2020   Chronic HFrEF (heart failure with reduced ejection fraction) (H[facility])    Adenomatous polyp of descending colon    Alcohol use disorder    COPD (chronic obstructive pulmonary disease) ([place]) 12/19/2013   CKD (chronic kidney disease), stage II 12/03/2013    Post-traumatic osteoarthritis of left hip 11/26/2013   Post-traumatic osteoarthritis of left knee 11/26/2013   Glaucoma 04/23/2013   Low back pain 08/29/2010   Tobacco use disorder 07/08/2010   PCP:  [facility] [place]:   [id number] [place] [place]Phone: [phone]: [place]Phone: [phone]: [phone]    Social Determinants of Health (SDOH) Interventions    Readmission Risk Interventions    09/12/2021   11:17 AM 09/12/2021   11:16 AM 08/26/2021    2:51 PM  Readmission Risk Prevention Plan  Transportation Screening  Complete Complete  PCP or Specialist Appt within 3-5 Days Complete  Complete  HRI or Home Care Consult  Complete Complete  Social Work Consult for [place]sel[place]  Complete Complete  Palliative Care Screening  Not Applicable Not Applicable  Medication Review (Press photographer  Complete Complete

## 2021-09-13 LAB — COMPREHENSIVE METABOLIC PANEL
ALT: 19 U/L (ref 0–44)
AST: 22 U/L (ref 15–41)
Albumin: 3.2 g/dL — ABNORMAL LOW (ref 3.5–5.0)
Alkaline Phosphatase: 151 U/L — ABNORMAL HIGH (ref 38–126)
Anion gap: 5 (ref 5–15)
BUN: 16 mg/dL (ref 8–23)
CO2: 20 mmol/L — ABNORMAL LOW (ref 22–32)
Calcium: 8.7 mg/dL — ABNORMAL LOW (ref 8.9–10.3)
Chloride: 109 mmol/L (ref 98–111)
Creatinine, Ser: 1.17 mg/dL (ref 0.61–1.24)
GFR, Estimated: >60 mL/min (ref >60)
Glucose, Bld: 105 mg/dL — ABNORMAL HIGH (ref 70–99)
Potassium: 4.2 mmol/L (ref 3.5–5.1)
Sodium: 134 mmol/L — ABNORMAL LOW (ref 135–145)
Total Bilirubin: 0.9 mg/dL (ref 0.3–1.2)
Total Protein: 6.5 g/dL (ref 6.5–8.1)

## 2021-09-13 LAB — MAGNESIUM: Magnesium: 2.1 mg/dL (ref 1.7–2.4)

## 2021-09-13 MED ORDER — ALPRAZOLAM 0.25 MG PO TABS: 0.2500 mg | ORAL_TABLET | Freq: Every day | ORAL | 0 refills | Status: DC | PRN

## 2021-09-13 MED ORDER — MORPHINE SULFATE (PF) 2 MG/ML IV SOLN
2.0000 mg | INTRAVENOUS | Status: DC | PRN
  Administered 2021-09-13: 2 mg via INTRAVENOUS
  Filled 2021-09-13: qty 1

## 2021-09-13 MED ORDER — ALPRAZOLAM 0.25 MG PO TABS
0.2500 mg | ORAL_TABLET | Freq: Once | ORAL | Status: AC
  Administered 2021-09-13: 0.25 mg via ORAL
  Filled 2021-09-13: qty 1

## 2021-09-13 MED ORDER — ALPRAZOLAM 0.25 MG PO TABS
0.2500 mg | ORAL_TABLET | Freq: Every day | ORAL | Status: DC | PRN
  Administered 2021-09-14: 0.25 mg via ORAL
  Filled 2021-09-13: qty 1

## 2021-09-13 NOTE — Progress Notes (Signed)
Discharge order in however patient informed MD he would not be able to leave until tomorrow morning 7/5 due to transportation and having someone at home.

## 2021-09-13 NOTE — Discharge Summary (Addendum)
Physician Discharge Summary   Patient: Ryan [name] MRN: [id number] DOB: 15-May-1950  Admit date:     09/08/2021  Discharge date: 09/14/2021  Discharge Physician: Shanon Brow Dick Hark   PCP: [facility]   Recommendations at discharge:   Please follow up with primary care provider within 1-2 weeks  Please repeat BMP and CBC in one week     Hospital Course: 71 y.o. male with medical history significant for systolic and diastolic CHF, chronic bronchitis, alcohol abuse, COPD, chronic chest pain, orthostatic hypotension. Patient presented to the ED with complaints of upper abdominal pain of 2 days duration.  He reports symptoms today with abdominal pain in his upper abdomen that radiated up into his chest and his left arm.  Reports pain is similar to his prior pancreatitis pain.  Reports occasional dizziness that is unchanged.  Reports chronic difficulty breathing, dyspnea on exertion is also unchanged. Patient states that he has been sober from alcohol for nearly 9 months. He denies any fevers, chills, chest pain, coughing, hemoptysis, vomiting, diarrhea, hematochezia, melena. ED Course: Tmax 98.6.  Heart rate 94-107.  Respiratory rate 15-25.  Blood pressure systolic 093-235.  Sats between 96% on room air.  Lipase 157.  Troponin 11 X 2.  Chest x-ray shows scarring or atelectasis.  CT abdomen and pelvis-shows chronic pancreatitis, dilatation of the main pancreatic duct, pancreatic mass and the head of the pancreas not excluded.  Also suggest duodenitis or infiltrative changes in the second and third portion of duodenum. 4 mg IV morphine given without significant improvement in patient's pain. Hospitalist to admit for acute pancreatitis. Recent hospitalization 6/15 to 6/22 for acute on chronic systolic and diastolic CHF also with AKI on CKD.  Patient was discharged with torsemide 60 mg daily.  Discharge weight was 74.7 kg.  GI was consulted to assist with management.  The patient was started on  judicious IV fluids initially.  He was initially n.p.o. with bowel rest and judicious IV opioids.  His improvement has been slow.  He has been advanced to clear liquids.  09/10/2021--patient developed some dizziness with transient hypotension with systolic in the upper 57D LR bolus given.  CT brain negative.  09/12/2021--having difficulty tolerating soft diet--complains of abd pain without frank vomiting.  Having some "spit up"  Had BM.  No cp, no sob.  Requesting something "lighter" than dilaudid for pain  09/13/2021--tolerating soft diet.  Initially wanted to go home.  Then had some social circumstances for which he could not go till 09/14/21 am. Abd pain overall better.  Seems to be doing better with xanax prn rather than IV opioids  Assessment and Plan: * Acute on chronic pancreatitis (HCC) Presented with lipase 157>>84 -Overall abdominal pain is slowly improving -Still continues to have intermittent dry heaving -Appreciate GI consult and follow-up -09/08/2021 CT abd--diffuse dilatation of main pancreatic duct with overall atrophy of pancreatic parenchyma.  Persistent indistinct deafness and edema of the second and third portion of the duodenum= duodenitis -Continue clear liquids>>soft diet 7/3>>having increase pain with soft diet -Continue PPI twice daily -Continue Creon -Patient has been sober for nearly 9 months -Changing dilaudid to morphine per patient request -seems to be doing better with xanax than IV opioids>>d/c IV dilaudid  Dilation of pancreatic duct CT/CT shows diffuse dilatation of the main pancreatic duct.  Consideration for pancreatic mass in the head of the pancreas.  Also possible duodenitis or infiltrative changes.   - Per care everywhere 04/2021-at [facility] clinic, per general surgery notes.  A follow-up MRI Abdomen with, without contrast completed on November 29, 2020 confirmed CBD dilation to 8 mm and common hepatic artery dilation to 1.5 cm. The main pancreas duct was  dilated to the pancreas head with atrophy of the body and tail. An EUS and ERCP were completed on January 30, 2021. A significant amount of retained gastric contents limited the procedure. Groove pancreatitis was noted. A CT of the Abdomen completed on March 01, 2021 did reveal narrowing of the portal vein confluence along with peri-pancreatic stranding. Concerns for an impending gastric outlet obstruction was noted. An endoscopic gastroduodenostomy with an axios stent was therefore completed for a duodenal stricture. His most recent CT of the Abdomen completed on April 21, 2021 revealed ongoing inflammation within the pancreas head. Although no obvious mass is seen on this scan, significant portal vein narrowing at the confluence is still noted.   they felt the imaging findings are concerning for an occult malignancy of the pancreas and but Dr. Marrion Coy was not certain whtether an attempt at an EUS to blindly biopsy the parenchymal tissus is possbie if a mass is not evident. He obtain CA 19-9 tumor marker and stated that if diagnosed with pancreatic malignancy that he felt as though successful operative intervention would be unlikely given his cardiac history with a EF of 15 to 20% along with ominous venous compromise on imaging.  He advised possible palliative radiation with systemic treatment if diagnosis confirmed    --GI following --out pt follow up with his primary GI in [place], [place] for possible EUS  Chronic HFrEF (heart failure with reduced ejection fraction) ([place]) Stable and compensated.  Recent prolonged hospitalization for same.  -Hold torsemide 60 mg daily for now as pt was hypotensive with dizziness 7/1 and very little po intake -Gentle hydration initially>>saline lock as pt now tolerates diet - Echo (5/23) EF < 20%, G3DD RV severely reduced, RVSP 46 mmHg, moderate MR - Has been off entresto and spiro with low BP Clinically euvolemic at time of d/c>>restart home dose torsemide 60  mg daily  COPD (chronic obstructive pulmonary disease) (HCC) Stable on RA -Resume home bronchodilator regimen  Orthostatic hypotension Continue midodrine 10 mg 3 times daily Place TED hose Use abdominal binder if patient is able to tolerate  Chronic kidney disease, stage 3a (HCC) Baseline creatinine 1.1-1.4 Monitor serial BMP  Tobacco use disorder Cessation discussed Continues to smoke few cigarettes per day         Consultants: GI Procedures performed: home  Disposition: Home Diet recommendation:  Cardiac diet DISCHARGE MEDICATION: Allergies as of 09/13/2021       Reactions   Tape Other (See Comments)   "TAPE TEARS OFF MY SKIN"        Medication List     TAKE these medications    albuterol 108 (90 Base) MCG/ACT inhaler Commonly known as: VENTOLIN HFA Inhale 2 puffs into the lungs every 6 (six) hours as needed for wheezing or shortness of breath.   albuterol (2.5 MG/3ML) 0.083% nebulizer solution Commonly known as: PROVENTIL Take 2.5 mg by nebulization every 6 (six) hours as needed for wheezing or shortness of breath.   ALPRAZolam 0.25 MG tablet Commonly known as: XANAX Take 1 tablet (0.25 mg total) by mouth daily as needed for anxiety.   camphor-menthol lotion Commonly known as: SARNA Apply 1 Application topically daily as needed for itching.   DOCUSATE SODIUM PO Take 2 capsules by mouth daily as needed for constipation.   dorzolamide-timolol 22.3-6.8  MG/ML ophthalmic solution Commonly known as: COSOPT Place 1 drop into both eyes 2 (two) times daily.   ferrous sulfate 325 (65 FE) MG tablet Take 325 mg by mouth 2 (two) times daily with a meal.   gabapentin 300 MG capsule Commonly known as: NEURONTIN Take 300 mg by mouth See admin instructions. Take two capsules (600 mg) by mouth twice daily, may take a 3rd dose (600 mg) midday as needed for pain   ivabradine 5 MG Tabs tablet Commonly known as: CORLANOR Take 1 tablet (5 mg total) by mouth 2  (two) times daily with a meal.   latanoprost 0.005 % ophthalmic solution Commonly known as: XALATAN Place 1 drop into both eyes at bedtime.   lipase/protease/amylase 12000-38000 units Cpep capsule Commonly known as: CREON Take 12,000 Units by mouth 3 (three) times daily with meals. Take with a 24000 unit capsule   loratadine 10 MG tablet Commonly known as: CLARITIN Take 10 mg by mouth at bedtime.   midodrine 5 MG tablet Commonly known as: PROAMATINE Take 2 tablets (10 mg total) by mouth 3 (three) times daily with meals.   Netarsudil Dimesylate 0.02 % Soln Place 1 drop into both eyes at bedtime.   pantoprazole 40 MG tablet Commonly known as: PROTONIX Take 1 tablet (40 mg total) by mouth daily. What changed: when to take this   potassium chloride 10 MEQ tablet Commonly known as: KLOR-CON Take 1 tablet (10 mEq total) by mouth daily.   rosuvastatin 20 MG tablet Commonly known as: CRESTOR Take 20 mg by mouth daily.   torsemide 20 MG tablet Commonly known as: DEMADEX Take 3 tablets (60 mg total) by mouth daily.   traMADol 50 MG tablet Commonly known as: ULTRAM Take 100 mg by mouth in the morning, at noon, and at bedtime.        Discharge Exam: Filed Weights   09/08/21 0958 09/08/21 1800  Weight: 80.3 kg 80.3 kg   HEENT:  [place]/AT, No thrush, no icterus CV:  RRR, no rub, no S3, no S4 Lung:  fine bibasilar rales. No wheeze Abd:  soft/+BS, NT Ext:  No edema, no lymphangitis, no synovitis, no rash   Condition at discharge: stable  The results of significant diagnostics from this hospitalization (including imaging, microbiology, ancillary and laboratory) are listed below for reference.   Imaging Studies: CT HEAD WO CONTRAST (5MM)  Result Date: 09/10/2021 CLINICAL DATA:  Dizziness, dehydration or hypotension. Pancreatitis. EXAM: CT HEAD WITHOUT CONTRAST TECHNIQUE: Contiguous axial images were obtained from the base of the skull through the vertex without intravenous  contrast. RADIATION DOSE REDUCTION: This exam was performed according to the departmental dose-optimization program which includes automated exposure control, adjustment of the mA and/or kV according to patient size and/or use of iterative reconstruction technique. COMPARISON:  None Available. FINDINGS: Brain: Mild atrophy and white matter changes are present. No acute infarct, hemorrhage, or mass lesion is present. Basal ganglia are intact. Insular ribbon is normal bilaterally. No acute or focal cortical abnormalities are present. The brainstem and cerebellum are within normal limits. Vascular: Atherosclerotic calcifications are present within the cavernous internal carotid arteries. No hyperdense vessel is present. Skull: Calvarium is intact. No focal lytic or blastic lesions are present. No significant extracranial soft tissue lesion is present. Sinuses/Orbits: The paranasal sinuses and mastoid air cells are clear. The globes and orbits are within normal limits. IMPRESSION: 1. No acute intracranial abnormality. 2. Mild atrophy and white matter disease likely reflects the sequela of chronic microvascular ischemia.  Electronically Signed   By: San Morelle M.D.   On: 09/10/2021 18:37   DG Chest Port 1 View  Result Date: 09/10/2021 CLINICAL DATA:  Dyspnea.  History of hypertension. EXAM: PORTABLE CHEST 1 VIEW COMPARISON:  09/08/2021 and older studies.  Chest CT, 06/28/2021. FINDINGS: Stable enlargement of the cardiac silhouette. No mediastinal or hilar masses. Stable interstitial prominence bilaterally. No evidence of pneumonia or pulmonary edema. No convincing pleural effusion or pneumothorax. Skeletal structures are grossly intact. IMPRESSION: No acute cardiopulmonary disease. Electronically Signed   By: Lajean Manes M.D.   On: 09/10/2021 16:17   CT ABDOMEN PELVIS W CONTRAST  Result Date: 09/08/2021 CLINICAL DATA:  Left upper quadrant abdominal pain. Recent diagnosis of pancreatitis. EXAM: CT  ABDOMEN AND PELVIS WITH CONTRAST TECHNIQUE: Multidetector CT imaging of the abdomen and pelvis was performed using the standard protocol following bolus administration of intravenous contrast. RADIATION DOSE REDUCTION: This exam was performed according to the departmental dose-optimization program which includes automated exposure control, adjustment of the mA and/or kV according to patient size and/or use of iterative reconstruction technique. CONTRAST:  149m OMNIPAQUE IOHEXOL 300 MG/ML  SOLN COMPARISON:  June 28, 2021 FINDINGS: Lower chest: Minimal bibasilar pleural thickening and subpleural scarring. Enlarged heart. Hepatobiliary: No focal liver abnormality is seen. No gallstones, gallbladder wall thickening, or biliary dilatation. Pancreas: Diffuse dilation of the main pancreatic duct with overall atrophic pancreatic parenchyma. Coarse calcifications within the head of the pancreas. Spleen: Normal in size without focal abnormality. Adrenals/Urinary Tract: Left upper pole renal lesion, too small to be actually characterized by CT. No evidence of hydronephrosis or hydroureter. Nonobstructive Large 1.7 cm right lower pole renal calculus. Stomach/Bowel: Again seen is tubular device connecting the stomach with the underlying jejunal segment. Persistent indistinctness/edema of the second and third portion of the duodenum. Vascular/Lymphatic: Aortic atherosclerosis. No enlarged abdominal or pelvic lymph nodes. Reproductive: Coarse calcifications of the prostate gland. Other: Mild mesenteric edema in the upper abdomen. Musculoskeletal: Advanced lytic and sclerotic changes of the left proximal femur. 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 IMPRESSION: 1. Diffuse dilation of the main pancreatic duct with overall atrophic pancreatic parenchyma. Coarse calcifications within the head of the pancreas. These findings may represent sequela of chronic pancreatitis. Pancreatic mass in the head of the pancreas cannot  be entirely excluded. 2. Persistent indistinctness/edema of the second and third portion of the duodenum, which may be due to duodenitis or infiltrative changes. Correlation to endoscopy results is recommended. 3. Nonobstructive right renal calculus. 4. Advanced lytic and sclerotic changes of the left proximal femur, which may be due to infectious/inflammatory etiology, posttraumatic changes. 5. 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 Aortic Atherosclerosis (ICD10-I70.0). Electronically Signed   By: DFidela SalisburyM.D.   On: 09/08/2021 15:24   DG Chest Port 1 View  Result Date: 09/08/2021 CLINICAL DATA:  Chest pain EXAM: PORTABLE CHEST 1 VIEW COMPARISON:  08/26/2018 FINDINGS: Transverse diameter of heart is increased. There are no signs of pulmonary edema or focal pulmonary consolidation. Small linear densities seen in the right lower lung fields. There is no significant pleural effusion or pneumothorax. IMPRESSION: Cardiomegaly. There are no signs of pulmonary edema or focal pulmonary consolidation. Small linear densities in the right lower lung fields suggest scarring or subsegmental atelectasis. Electronically Signed   By: PElmer PickerM.D.   On: 09/08/2021 10:28   DG Chest 1 View  Result Date: 08/25/2021 CLINICAL DATA:  Provided history: Shortness of breath, leg swelling, dizziness. History of  heart failure. EXAM: CHEST  1 VIEW COMPARISON:  Prior chest radiographs 07/28/2021 and earlier. FINDINGS: The patient's chin partially obscures the medial lung apices, bilaterally. Cardiomegaly. Aortic atherosclerosis. Mild ill-defined opacity right lung base, favored to reflect atelectasis. No evidence of pleural effusion or pneumothorax. No acute bony abnormality identified. IMPRESSION: The patient's chin partially obscures the medial lung apices, bilaterally. Cardiomegaly. Mild ill-defined opacity within the right lung base, favored to reflect atelectasis. Aortic Atherosclerosis  (ICD10-I70.0). Electronically Signed   By: Kellie Simmering D.O.   On: 08/25/2021 13:34    Microbiology: Results for orders placed or performed during the hospital encounter of 07/28/21  MRSA Next Gen by PCR, Nasal     Status: Abnormal   Collection Time: 07/29/21  3:40 AM   Specimen: Nasal Mucosa; Nasal Swab  Result Value Ref Range Status   MRSA by PCR Next Gen DETECTED (A) NOT DETECTED Final    Comment: RESULT CALLED TO, READ BACK BY AND VERIFIED WITH: RN Erling Conte 07/29/21'@4'$ :43 BY TW (NOTE) The GeneXpert MRSA Assay (FDA approved for NASAL specimens only), is one component of a comprehensive MRSA colonization surveillance program. It is not intended to diagnose MRSA infection nor to guide or monitor treatment for MRSA infections. Test performance is not FDA approved in patients less than 13 years old. Performed at [facility], [place] [address], [place], [place] [address]     Labs: CBC: Recent Labs  Lab 09/08/21 1012 09/09/21 0449 09/10/21 0631  WBC 9.0 6.7 8.4  NEUTROABS 6.7  --  6.0  HGB 15.4 13.1 14.0  HCT 47.8 41.6 45.7  MCV 87.7 90.4 91.2  PLT 263 203 295   Basic Metabolic Panel: Recent Labs  Lab 09/08/21 1102 09/09/21 0449 09/10/21 0631 09/13/21 0501  NA 136 136 135 134*  K 3.6 4.5 4.0 4.2  CL 101 106 104 109  CO2 '29 23 25 '$ 20*  GLUCOSE 114* 121* 99 105*  BUN '14 14 15 16  '$ CREATININE 1.04 1.09 1.05 1.17  CALCIUM 9.9 8.8* 9.4 8.7*  MG  --   --   --  2.1   Liver Function Tests: Recent Labs  Lab 09/08/21 1102 09/10/21 0631 09/13/21 0501  AST '30 25 22  '$ ALT '29 21 19  '$ ALKPHOS 128* 126 151*  BILITOT 0.9 0.7 0.9  PROT 7.1 7.6 6.5  ALBUMIN 3.6 3.8 3.2*   CBG: Recent Labs  Lab 09/10/21 1111 09/10/21 1330 09/10/21 1519  GLUCAP 237* 120* 85    Discharge time spent: greater than 30 minutes.  Signed: Orson Eva, MD [facility] 09/13/2021

## 2021-09-13 NOTE — Plan of Care (Signed)

## 2021-09-14 ENCOUNTER — Telehealth (HOSPITAL_COMMUNITY): Payer: Self-pay

## 2021-09-14 NOTE — Telephone Encounter (Signed)
Called and spoke to patient's sister to confirm/remind patient of their appointment at the [facility] on 09/15/21.   In addition, to bring all medications and/or complete list.

## 2021-09-15 ENCOUNTER — Encounter (HOSPITAL_COMMUNITY): Payer: Medicare Other

## 2021-10-03 ENCOUNTER — Telehealth (HOSPITAL_COMMUNITY): Payer: Self-pay

## 2021-10-03 NOTE — Telephone Encounter (Signed)
Called and spoke to patient's sister Elisha Headland to confirm/remind patient of their appointment at the [facility] on 10/04/21.   In addition, if patient could  bring all medications and/or complete list.

## 2021-10-04 ENCOUNTER — Encounter (HOSPITAL_COMMUNITY): Payer: No Typology Code available for payment source

## 2021-10-05 ENCOUNTER — Ambulatory Visit (HOSPITAL_COMMUNITY)
Admission: RE | Admit: 2021-10-05 | Discharge: 2021-10-05 | Disposition: A | Discharge status: Home / Self Care | Payer: Medicare Other | Source: Ambulatory Visit | Attending: Family Medicine | Admitting: Family Medicine

## 2021-10-05 ENCOUNTER — Encounter (HOSPITAL_COMMUNITY): Payer: Self-pay

## 2021-10-05 VITALS — BP 90/80 | HR 98 | Wt 172.6 lb

## 2021-10-05 DIAGNOSIS — K861 Other chronic pancreatitis: Secondary | ICD-10-CM

## 2021-10-05 DIAGNOSIS — Z87898 Personal history of other specified conditions: Secondary | ICD-10-CM | POA: Diagnosis not present

## 2021-10-05 DIAGNOSIS — Z79899 Other long term (current) drug therapy: Secondary | ICD-10-CM | POA: Insufficient documentation

## 2021-10-05 DIAGNOSIS — R0602 Shortness of breath: Secondary | ICD-10-CM | POA: Insufficient documentation

## 2021-10-05 DIAGNOSIS — I951 Orthostatic hypotension: Secondary | ICD-10-CM | POA: Diagnosis not present

## 2021-10-05 DIAGNOSIS — K860 Alcohol-induced chronic pancreatitis: Secondary | ICD-10-CM | POA: Insufficient documentation

## 2021-10-05 DIAGNOSIS — F1010 Alcohol abuse, uncomplicated: Secondary | ICD-10-CM | POA: Insufficient documentation

## 2021-10-05 DIAGNOSIS — F17210 Nicotine dependence, cigarettes, uncomplicated: Secondary | ICD-10-CM | POA: Diagnosis not present

## 2021-10-05 DIAGNOSIS — F17200 Nicotine dependence, unspecified, uncomplicated: Secondary | ICD-10-CM

## 2021-10-05 DIAGNOSIS — I5023 Acute on chronic systolic (congestive) heart failure: Secondary | ICD-10-CM | POA: Insufficient documentation

## 2021-10-05 DIAGNOSIS — N183 Chronic kidney disease, stage 3 unspecified: Secondary | ICD-10-CM

## 2021-10-05 DIAGNOSIS — I130 Hypertensive heart and chronic kidney disease with heart failure and stage 1 through stage 4 chronic kidney disease, or unspecified chronic kidney disease: Secondary | ICD-10-CM | POA: Diagnosis not present

## 2021-10-05 DIAGNOSIS — R079 Chest pain, unspecified: Secondary | ICD-10-CM | POA: Diagnosis not present

## 2021-10-05 DIAGNOSIS — N1831 Chronic kidney disease, stage 3a: Secondary | ICD-10-CM | POA: Insufficient documentation

## 2021-10-05 DIAGNOSIS — F1011 Alcohol abuse, in remission: Secondary | ICD-10-CM

## 2021-10-05 LAB — BASIC METABOLIC PANEL WITH GFR
Anion gap: 10 (ref 5–15)
BUN: 16 mg/dL (ref 8–23)
CO2: 22 mmol/L (ref 22–32)
Calcium: 9.4 mg/dL (ref 8.9–10.3)
Chloride: 106 mmol/L (ref 98–111)
Creatinine, Ser: 1.22 mg/dL (ref 0.61–1.24)
GFR, Estimated: >60 mL/min
Glucose, Bld: 77 mg/dL (ref 70–99)
Potassium: 3.5 mmol/L (ref 3.5–5.1)
Sodium: 138 mmol/L (ref 135–145)

## 2021-10-05 LAB — BRAIN NATRIURETIC PEPTIDE: B Natriuretic Peptide: 2050.1 pg/mL — ABNORMAL HIGH (ref 0.0–100.0)

## 2021-10-05 MED ORDER — POTASSIUM CHLORIDE ER 10 MEQ PO TBCR: 20.0000 meq | EXTENDED_RELEASE_TABLET | Freq: Every day | ORAL | 3 refills | Status: AC

## 2021-10-05 MED ORDER — TORSEMIDE 20 MG PO TABS: 80.0000 mg | ORAL_TABLET | Freq: Every day | ORAL | 5 refills | Status: DC
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
## 2021-10-05 NOTE — Patient Instructions (Addendum)
Increase your Torsemide to '60mg'$  Twice daily for the next 3 days, then start '80mg'$  daily.  Increase Potassium to 46mq for the next 3 days (with the extra Torsemide), then take 275m daily  Labs done today, your results will be available in MyChart, we will contact you for abnormal readings.  Please wear your abdominal binder when you get it.  Your physician recommends that you schedule a follow-up appointment in: as scheduled.  Follow up with Dr.McLean in 3 months ( October 2023)  ** please call office in August to arrange your follow up appointment **  If you have any questions or concerns before your next appointment please send usKorea message through myKaner call our office at [phone]   TO LEAVE A MESSAGE FOR THE NURSE SELECT OPTION 2, PLEASE LEAVE A MESSAGE INCLUDING: YOUR NAME DATE OF BIRTH CALL BACK NUMBER REASON FOR CALL**this is important as we prioritize the call backs  YOU WILL RECEIVE A CALL BACK THE SAME DAY AS LONG AS YOU CALL BEFORE 4:00 PM  At the [facility] and your health needs are our priority. As part of our continuing mission to provide you with exceptional heart care, we have created designated Provider Care Teams. These Care Teams include your primary Cardiologist (physician) and Advanced Practice Providers (APPs- Physician Assistants and Nurse Practitioners) who all work together to provide you with the care you need, when you need it.   You may see any of the following providers on your designated Care Team at your next follow up: Dr DaGlori Bickersr DaHaynes KernsNP BrLyda JesterPAUtaheSelect Specialty Hospital [facility] EastiGoodyearPAUtahaAudry RilesPharmD   Please be sure to bring in all your medications bottles to every appointment.

## 2021-10-05 NOTE — Progress Notes (Signed)
ReDS Vest / Clip - 10/05/21 0900       ReDS Vest / Clip   Station Marker C    Ruler Value 26    ReDS Value Range Moderate volume overload    ReDS Actual Value 38

## 2021-10-26 ENCOUNTER — Telehealth (HOSPITAL_COMMUNITY): Payer: Self-pay

## 2021-10-26 NOTE — Telephone Encounter (Signed)
Called to confirm/remind patient of their appointment at the [facility] on 10/27/21.   Patient reminded to bring all medications and/or complete list.  Confirmed patient has transportation. Gave directions, instructed to utilize [facility] parking.  Confirmed appointment prior to ending call.

## 2021-10-27 ENCOUNTER — Encounter (HOSPITAL_COMMUNITY): Payer: No Typology Code available for payment source

## 2021-11-16 ENCOUNTER — Encounter (HOSPITAL_COMMUNITY): Payer: No Typology Code available for payment source

## 2022-01-11 ENCOUNTER — Encounter (HOSPITAL_COMMUNITY): Payer: No Typology Code available for payment source | Admitting: Internal Medicine

## 2022-01-18 ENCOUNTER — Emergency Department (HOSPITAL_COMMUNITY): Payer: No Typology Code available for payment source

## 2022-01-18 ENCOUNTER — Inpatient Hospital Stay (HOSPITAL_COMMUNITY)
Admission: EM | Admit: 2022-01-18 | Discharge: 2022-01-24 | DRG: 291 | Disposition: A | Discharge status: Home / Self Care | Payer: No Typology Code available for payment source | Attending: Internal Medicine | Admitting: Internal Medicine

## 2022-01-18 ENCOUNTER — Other Ambulatory Visit: Payer: Self-pay

## 2022-01-18 ENCOUNTER — Inpatient Hospital Stay (HOSPITAL_COMMUNITY): Payer: No Typology Code available for payment source

## 2022-01-18 ENCOUNTER — Encounter (HOSPITAL_COMMUNITY): Payer: Self-pay

## 2022-01-18 DIAGNOSIS — I951 Orthostatic hypotension: Secondary | ICD-10-CM | POA: Diagnosis not present

## 2022-01-18 DIAGNOSIS — F1010 Alcohol abuse, uncomplicated: Secondary | ICD-10-CM | POA: Diagnosis present

## 2022-01-18 DIAGNOSIS — J90 Pleural effusion, not elsewhere classified: Secondary | ICD-10-CM

## 2022-01-18 DIAGNOSIS — E876 Hypokalemia: Secondary | ICD-10-CM | POA: Insufficient documentation

## 2022-01-18 DIAGNOSIS — J449 Chronic obstructive pulmonary disease, unspecified: Secondary | ICD-10-CM | POA: Diagnosis present

## 2022-01-18 DIAGNOSIS — N1831 Chronic kidney disease, stage 3a: Secondary | ICD-10-CM | POA: Diagnosis present

## 2022-01-18 DIAGNOSIS — F32A Depression, unspecified: Secondary | ICD-10-CM | POA: Diagnosis present

## 2022-01-18 DIAGNOSIS — R0602 Shortness of breath: Secondary | ICD-10-CM | POA: Diagnosis not present

## 2022-01-18 DIAGNOSIS — K219 Gastro-esophageal reflux disease without esophagitis: Secondary | ICD-10-CM | POA: Diagnosis present

## 2022-01-18 DIAGNOSIS — G4733 Obstructive sleep apnea (adult) (pediatric): Secondary | ICD-10-CM | POA: Diagnosis not present

## 2022-01-18 DIAGNOSIS — Z79899 Other long term (current) drug therapy: Secondary | ICD-10-CM

## 2022-01-18 DIAGNOSIS — Z823 Family history of stroke: Secondary | ICD-10-CM | POA: Diagnosis not present

## 2022-01-18 DIAGNOSIS — Z8249 Family history of ischemic heart disease and other diseases of the circulatory system: Secondary | ICD-10-CM | POA: Diagnosis not present

## 2022-01-18 DIAGNOSIS — I428 Other cardiomyopathies: Secondary | ICD-10-CM | POA: Diagnosis present

## 2022-01-18 DIAGNOSIS — I5023 Acute on chronic systolic (congestive) heart failure: Secondary | ICD-10-CM | POA: Diagnosis not present

## 2022-01-18 DIAGNOSIS — R55 Syncope and collapse: Principal | ICD-10-CM

## 2022-01-18 DIAGNOSIS — H409 Unspecified glaucoma: Secondary | ICD-10-CM | POA: Diagnosis present

## 2022-01-18 DIAGNOSIS — Z833 Family history of diabetes mellitus: Secondary | ICD-10-CM

## 2022-01-18 DIAGNOSIS — Z806 Family history of leukemia: Secondary | ICD-10-CM | POA: Diagnosis not present

## 2022-01-18 DIAGNOSIS — I130 Hypertensive heart and chronic kidney disease with heart failure and stage 1 through stage 4 chronic kidney disease, or unspecified chronic kidney disease: Secondary | ICD-10-CM | POA: Diagnosis not present

## 2022-01-18 DIAGNOSIS — I5043 Acute on chronic combined systolic (congestive) and diastolic (congestive) heart failure: Secondary | ICD-10-CM | POA: Diagnosis present

## 2022-01-18 DIAGNOSIS — Z8701 Personal history of pneumonia (recurrent): Secondary | ICD-10-CM | POA: Diagnosis not present

## 2022-01-18 DIAGNOSIS — N179 Acute kidney failure, unspecified: Secondary | ICD-10-CM | POA: Diagnosis not present

## 2022-01-18 DIAGNOSIS — F17210 Nicotine dependence, cigarettes, uncomplicated: Secondary | ICD-10-CM | POA: Diagnosis present

## 2022-01-18 DIAGNOSIS — K861 Other chronic pancreatitis: Secondary | ICD-10-CM | POA: Diagnosis present

## 2022-01-18 DIAGNOSIS — J410 Simple chronic bronchitis: Secondary | ICD-10-CM | POA: Diagnosis not present

## 2022-01-18 LAB — BASIC METABOLIC PANEL
Anion gap: 10 (ref 5–15)
BUN: 11 mg/dL (ref 8–23)
CO2: 23 mmol/L (ref 22–32)
Calcium: 8.9 mg/dL (ref 8.9–10.3)
Chloride: 106 mmol/L (ref 98–111)
Creatinine, Ser: 1.21 mg/dL (ref 0.61–1.24)
GFR, Estimated: >60 mL/min (ref >60)
Glucose, Bld: 105 mg/dL — ABNORMAL HIGH (ref 70–99)
Potassium: 3 mmol/L — ABNORMAL LOW (ref 3.5–5.1)
Sodium: 139 mmol/L (ref 135–145)

## 2022-01-18 LAB — HEPATIC FUNCTION PANEL
ALT: 27 U/L (ref 0–44)
AST: 33 U/L (ref 15–41)
Albumin: 2.9 g/dL — ABNORMAL LOW (ref 3.5–5.0)
Alkaline Phosphatase: 191 U/L — ABNORMAL HIGH (ref 38–126)
Bilirubin, Direct: 1.4 mg/dL — ABNORMAL HIGH (ref 0.0–0.2)
Indirect Bilirubin: 1.3 mg/dL — ABNORMAL HIGH (ref 0.3–0.9)
Total Bilirubin: 2.7 mg/dL — ABNORMAL HIGH (ref 0.3–1.2)
Total Protein: 7.5 g/dL (ref 6.5–8.1)

## 2022-01-18 LAB — CREATININE, SERUM
Creatinine, Ser: 1.16 mg/dL (ref 0.61–1.24)
GFR, Estimated: >60 mL/min (ref >60)

## 2022-01-18 LAB — CBC
HCT: 44.8 % (ref 39.0–52.0)
HCT: 43.9 % (ref 39.0–52.0)
Hemoglobin: 14.4 g/dL (ref 13.0–17.0)
Hemoglobin: 14 g/dL (ref 13.0–17.0)
MCH: 30.4 pg (ref 26.0–34.0)
MCH: 30.2 pg (ref 26.0–34.0)
MCHC: 32.1 g/dL (ref 30.0–36.0)
MCHC: 31.9 g/dL (ref 30.0–36.0)
MCV: 94.5 fL (ref 80.0–100.0)
MCV: 94.6 fL (ref 80.0–100.0)
Platelets: 211 10*3/uL (ref 150–400)
Platelets: 221 10*3/uL (ref 150–400)
RBC: 4.74 MIL/uL (ref 4.22–5.81)
RBC: 4.64 MIL/uL (ref 4.22–5.81)
RDW: 21.9 % — ABNORMAL HIGH (ref 11.5–15.5)
RDW: 22.2 % — ABNORMAL HIGH (ref 11.5–15.5)
WBC: 6.8 10*3/uL (ref 4.0–10.5)
WBC: 6.9 10*3/uL (ref 4.0–10.5)
nRBC: 0 % (ref 0.0–0.2)
nRBC: 0 % (ref 0.0–0.2)

## 2022-01-18 LAB — LIPASE, BLOOD: Lipase: 38 U/L (ref 11–51)

## 2022-01-18 LAB — PHOSPHORUS: Phosphorus: 3.2 mg/dL (ref 2.5–4.6)

## 2022-01-18 LAB — TROPONIN I (HIGH SENSITIVITY)
Troponin I (High Sensitivity): 36 ng/L — ABNORMAL HIGH (ref <18)
Troponin I (High Sensitivity): 32 ng/L — ABNORMAL HIGH (ref <18)

## 2022-01-18 LAB — BRAIN NATRIURETIC PEPTIDE: B Natriuretic Peptide: 2741 pg/mL — ABNORMAL HIGH (ref 0.0–100.0)

## 2022-01-18 LAB — MAGNESIUM: Magnesium: 1.9 mg/dL (ref 1.7–2.4)

## 2022-01-18 MED ORDER — POTASSIUM CHLORIDE 10 MEQ/100ML IV SOLN
10.0000 meq | Freq: Once | INTRAVENOUS | Status: AC
  Administered 2022-01-18: 10 meq via INTRAVENOUS
  Filled 2022-01-18: qty 100

## 2022-01-18 MED ORDER — ONDANSETRON HCL 4 MG PO TABS: 4.0000 mg | ORAL_TABLET | Freq: Four times a day (QID) | ORAL | Status: DC | PRN

## 2022-01-18 MED ORDER — TRAMADOL HCL 50 MG PO TABS
100.0000 mg | ORAL_TABLET | Freq: Three times a day (TID) | ORAL | Status: DC | PRN
  Administered 2022-01-18 – 2022-01-21 (×3): 100 mg via ORAL
  Filled 2022-01-18 (×3): qty 2

## 2022-01-18 MED ORDER — DORZOLAMIDE HCL-TIMOLOL MAL 2-0.5 % OP SOLN
1.0000 [drp] | Freq: Two times a day (BID) | OPHTHALMIC | Status: DC
  Administered 2022-01-18 – 2022-01-24 (×10): 1 [drp] via OPHTHALMIC
  Filled 2022-01-18: qty 10

## 2022-01-18 MED ORDER — SODIUM CHLORIDE 0.9% FLUSH: 3.0000 mL | INTRAVENOUS | Status: DC | PRN

## 2022-01-18 MED ORDER — ACETAMINOPHEN 650 MG RE SUPP: 650.0000 mg | Freq: Four times a day (QID) | RECTAL | Status: DC | PRN

## 2022-01-18 MED ORDER — LORATADINE 10 MG PO TABS
10.0000 mg | ORAL_TABLET | Freq: Every day | ORAL | Status: DC
  Administered 2022-01-18 – 2022-01-23 (×6): 10 mg via ORAL
  Filled 2022-01-18 (×6): qty 1

## 2022-01-18 MED ORDER — SODIUM CHLORIDE 0.9 % IV SOLN: 250.0000 mL | INTRAVENOUS | Status: DC | PRN

## 2022-01-18 MED ORDER — ALBUTEROL SULFATE (2.5 MG/3ML) 0.083% IN NEBU
2.5000 mg | INHALATION_SOLUTION | Freq: Four times a day (QID) | RESPIRATORY_TRACT | Status: DC | PRN
  Administered 2022-01-21 – 2022-01-22 (×2): 2.5 mg via RESPIRATORY_TRACT
  Filled 2022-01-18 (×2): qty 3

## 2022-01-18 MED ORDER — SODIUM CHLORIDE 0.9% FLUSH
3.0000 mL | Freq: Two times a day (BID) | INTRAVENOUS | Status: DC
  Administered 2022-01-18 – 2022-01-24 (×12): 3 mL via INTRAVENOUS

## 2022-01-18 MED ORDER — POTASSIUM CHLORIDE CRYS ER 20 MEQ PO TBCR
40.0000 meq | EXTENDED_RELEASE_TABLET | Freq: Once | ORAL | Status: AC
  Administered 2022-01-18: 40 meq via ORAL
  Filled 2022-01-18: qty 2

## 2022-01-18 MED ORDER — LATANOPROST 0.005 % OP SOLN
1.0000 [drp] | Freq: Every day | OPHTHALMIC | Status: DC
  Administered 2022-01-18 – 2022-01-23 (×4): 1 [drp] via OPHTHALMIC
  Filled 2022-01-18: qty 2.5

## 2022-01-18 MED ORDER — FUROSEMIDE 10 MG/ML IJ SOLN
60.0000 mg | Freq: Once | INTRAMUSCULAR | Status: AC
  Administered 2022-01-18: 60 mg via INTRAVENOUS
  Filled 2022-01-18: qty 6

## 2022-01-18 MED ORDER — FUROSEMIDE 10 MG/ML IJ SOLN
40.0000 mg | Freq: Two times a day (BID) | INTRAMUSCULAR | Status: DC
  Administered 2022-01-19 – 2022-01-23 (×9): 40 mg via INTRAVENOUS
  Filled 2022-01-18 (×9): qty 4

## 2022-01-18 MED ORDER — NETARSUDIL DIMESYLATE 0.02 % OP SOLN: 1.0000 [drp] | Freq: Every day | OPHTHALMIC | Status: DC

## 2022-01-18 MED ORDER — POTASSIUM CHLORIDE CRYS ER 20 MEQ PO TBCR
40.0000 meq | EXTENDED_RELEASE_TABLET | Freq: Every day | ORAL | Status: DC
  Administered 2022-01-19 – 2022-01-23 (×5): 40 meq via ORAL
  Filled 2022-01-18 (×7): qty 2

## 2022-01-18 MED ORDER — PANTOPRAZOLE SODIUM 40 MG PO TBEC
40.0000 mg | DELAYED_RELEASE_TABLET | Freq: Every day | ORAL | Status: DC
  Administered 2022-01-18 – 2022-01-23 (×6): 40 mg via ORAL
  Filled 2022-01-18 (×6): qty 1

## 2022-01-18 MED ORDER — MIDODRINE HCL 5 MG PO TABS
10.0000 mg | ORAL_TABLET | Freq: Three times a day (TID) | ORAL | Status: DC
  Administered 2022-01-19 – 2022-01-20 (×6): 10 mg via ORAL
  Filled 2022-01-18 (×6): qty 2

## 2022-01-18 MED ORDER — ACETAMINOPHEN 325 MG PO TABS
650.0000 mg | ORAL_TABLET | Freq: Four times a day (QID) | ORAL | Status: DC | PRN
  Administered 2022-01-18 – 2022-01-21 (×2): 650 mg via ORAL
  Filled 2022-01-18 (×2): qty 2

## 2022-01-18 MED ORDER — ROSUVASTATIN CALCIUM 20 MG PO TABS
20.0000 mg | ORAL_TABLET | Freq: Every day | ORAL | Status: DC
  Administered 2022-01-18 – 2022-01-23 (×6): 20 mg via ORAL
  Filled 2022-01-18 (×6): qty 1

## 2022-01-18 MED ORDER — ONDANSETRON HCL 4 MG/2ML IJ SOLN
4.0000 mg | Freq: Four times a day (QID) | INTRAMUSCULAR | Status: DC | PRN
  Administered 2022-01-18 – 2022-01-19 (×2): 4 mg via INTRAVENOUS
  Filled 2022-01-18 (×2): qty 2

## 2022-01-18 MED ORDER — GABAPENTIN 300 MG PO CAPS
600.0000 mg | ORAL_CAPSULE | Freq: Two times a day (BID) | ORAL | Status: DC
  Administered 2022-01-19 – 2022-01-22 (×7): 600 mg via ORAL
  Filled 2022-01-18 (×7): qty 2

## 2022-01-18 MED ORDER — PANCRELIPASE (LIP-PROT-AMYL) 36000-114000 UNITS PO CPEP
36000.0000 [IU] | ORAL_CAPSULE | Freq: Three times a day (TID) | ORAL | Status: DC
  Administered 2022-01-19 – 2022-01-24 (×15): 36000 [IU] via ORAL
  Filled 2022-01-18 (×16): qty 1

## 2022-01-18 MED ORDER — FENTANYL CITRATE PF 50 MCG/ML IJ SOSY
50.0000 ug | PREFILLED_SYRINGE | Freq: Once | INTRAMUSCULAR | Status: AC
  Administered 2022-01-18: 50 ug via INTRAVENOUS
  Filled 2022-01-18: qty 1

## 2022-01-18 MED ORDER — HEPARIN SODIUM (PORCINE) 5000 UNIT/ML IJ SOLN
5000.0000 [IU] | Freq: Three times a day (TID) | INTRAMUSCULAR | Status: DC
  Administered 2022-01-18: 5000 [IU] via SUBCUTANEOUS
  Filled 2022-01-18 (×7): qty 1

## 2022-01-18 NOTE — Assessment & Plan Note (Signed)
-  Stable and at baseline currently -Continue to follow renal function trend -Patient instructed to maintain adequate hydration -Minimize the use of nephrotoxic agents, contrast and avoid hypotension.

## 2022-01-18 NOTE — ED Triage Notes (Signed)
Hx of HF. CP x a few weeks. Pt reports passing out in the bathroom this morning. Reports dizziness and lightheaded. LLE edema

## 2022-01-18 NOTE — Assessment & Plan Note (Addendum)
-  Patient complaining of shortness of breath and orthopnea -Most recent echo with ejection fraction of 15-20% -Elevated BNP in the 2700 range appreciated at time of admission. -Will transition diuretics to oral route -Continue low-sodium diet, daily weights and strict intake and output -follow REDS Clips measurement (currently 34-35). -After diuresis overnight repeat limited right-sided ultrasound demonstrated a small pleural effusion with no window for thoracentesis. -Continue supportive care and follow clinical response.

## 2022-01-18 NOTE — Assessment & Plan Note (Addendum)
-   Continue to follow electrolytes and further replete as needed.   -Magnesium within normal limits.

## 2022-01-18 NOTE — Assessment & Plan Note (Addendum)
-  Resume home eyedrop regimen.

## 2022-01-18 NOTE — Assessment & Plan Note (Signed)
-  Reporting intermittent midepigastric discomfort (not new for him) -Continue the use of Creon -Low-fat diet discussed with patient and he has been encouraged for compliance with it. -continue chronic analgesic therapy.

## 2022-01-18 NOTE — Assessment & Plan Note (Signed)
-  Appears to be stable and well-controlled currently -No wheezing on exam. -Continue home bronchodilator management. -Good saturation on room air appreciated. -Current tachypnea and short winded sensation driven by his acute on chronic systolic heart failure.
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
## 2022-01-18 NOTE — Assessment & Plan Note (Addendum)
-  Continue midodrine therapy. -Patient educated about to change positions slowly to minimize dizziness and near syncope events. -Continue adjusted dose of midodrine (15 mg 3 times daily). -Continue to follow response to adjusted dose of midodrine while reassessing orthostatic vital signs at follow-up visit.

## 2022-01-18 NOTE — ED Provider Notes (Signed)
[facility] EMERGENCY DEPARTMENT Provider Note   CSN: [id number] Arrival date & time: 01/18/22  1047     History  Chief Complaint  Patient presents with   Chest Pain   Dizziness   Loss of Consciousness    Ryan [name] is a 71 y.o. male present emerged department with chest pain and shortness of breath.  Patient reports he been having chest pain for few weeks, on and off, on both sides of the chest.  He reports that he was in the bathroom today, walking to the bathroom, he began to feel very lightheaded with vertigo, then lost consciousness.  He says he thinks he struck his chest as he fell.  He reports he has been increasingly dizzy for the past several weeks.  He also reports worsening edema of his lower extremities.  He does have a history congestive heart failure and takes Lasix at home.  His last echocardiogram was in May 2023 with an EF less than 20%, severely decreased left ventricular function and global hypokinesis.  He is moderately dilated atrium bilaterally.  He also has chronic hypotension and takes midodrine  HPI     Home Medications Prior to Admission medications   Medication Sig Start Date End Date Taking? Authorizing Provider  albuterol (PROVENTIL) (2.5 MG/3ML) 0.083% nebulizer solution Take 2.5 mg by nebulization every 6 (six) hours as needed for wheezing or shortness of breath.   Yes [provider]  albuterol (VENTOLIN HFA) 108 (90 Base) MCG/ACT inhaler Inhale 2 puffs into the lungs every 6 (six) hours as needed for wheezing or shortness of breath.   Yes [provider]  DOCUSATE SODIUM PO Take 2 capsules by mouth daily as needed for constipation.   Yes [provider]  dorzolamide-timolol (COSOPT) 22.3-6.8 MG/ML ophthalmic solution Place 1 drop into both eyes 2 (two) times daily.   Yes [provider]  ferrous sulfate 325 (65 FE) MG tablet Take 325 mg by mouth 2 (two) times daily with a meal.   Yes [provider]   gabapentin (NEURONTIN) 300 MG capsule Take 300 mg by mouth See admin instructions. Take two capsules (600 mg) by mouth twice daily, may take a 3rd dose (600 mg) midday as needed for pain   Yes [provider]  latanoprost (XALATAN) 0.005 % ophthalmic solution Place 1 drop into both eyes at bedtime.   Yes [provider]  lipase/protease/amylase (CREON) 12000-38000 units CPEP capsule Take 12,000 Units by mouth 3 (three) times daily with meals. Take with a 24000 unit capsule   Yes [provider]  loratadine (CLARITIN) 10 MG tablet Take 10 mg by mouth at bedtime.   Yes [provider]  ALPRAZolam (XANAX) 0.25 MG tablet Take 1 tablet (0.25 mg total) by mouth daily as needed for anxiety. Patient not taking: Reported on 01/18/2022 09/13/21   Orson Eva, MD  camphor-menthol [facility]) lotion Apply 1 Application topically daily as needed for itching.    [provider]  ivabradine (CORLANOR) 5 MG TABS tablet Take 1 tablet (5 mg total) by mouth 2 (two) times daily with a meal. Patient not taking: Reported on 01/18/2022 08/04/21   Ledora Bottcher, PA  midodrine (PROAMATINE) 5 MG tablet Take 2 tablets (10 mg total) by mouth 3 (three) times daily with meals. 09/01/21   Samuella Cota, MD  Netarsudil Dimesylate 0.02 % SOLN Place 1 drop into both eyes at bedtime.    [provider]  pantoprazole (PROTONIX) 40 MG tablet  Take 1 tablet (40 mg total) by mouth daily. Patient taking differently: Take 40 mg by mouth at bedtime. 10/08/19   Nuala Alpha A, PA-C  potassium chloride (KLOR-CON) 10 MEQ tablet Take 2 tablets (20 mEq total) by mouth daily. 10/05/21   [place], Maricela Bo, FNP  rosuvastatin (CRESTOR) 20 MG tablet Take 20 mg by mouth daily.    [provider]  torsemide (DEMADEX) 20 MG tablet Take 4 tablets (80 mg total) by mouth daily. 10/05/21   [place], Maricela Bo, FNP  traMADol (ULTRAM) 50 MG tablet Take 100 mg by mouth in the morning, at noon, and  at bedtime.    [provider]      Allergies    Tape    Review of Systems   Review of Systems  Physical Exam Updated Vital Signs BP (!) 117/90   Pulse (!) 107   Temp 97.9 F (36.6 C) (Oral)   Resp (!) 25   Ht '5\' 11"'$  (1.803 m)   Wt 80.3 kg   SpO2 98%   BMI 24.69 kg/m  Physical Exam Constitutional:      General: He is not in acute distress. HENT:     Head: Normocephalic and atraumatic.  Eyes:     Conjunctiva/sclera: Conjunctivae normal.     Pupils: Pupils are equal, round, and reactive to light.  Cardiovascular:     Rate and Rhythm: Regular rhythm. Tachycardia present.  Pulmonary:     Effort: Pulmonary effort is normal. No respiratory distress.     Comments: Tachypnea, speaking in shorter sentences, crackles and rales in the bilateral lower lobes Abdominal:     General: There is no distension.     Tenderness: There is no abdominal tenderness.  Skin:    General: Skin is warm and dry.  Neurological:     General: No focal deficit present.     Mental Status: He is alert. Mental status is at baseline.  Psychiatric:        Mood and Affect: Mood normal.        Behavior: Behavior normal.     ED Results / Procedures / Treatments   Labs (all labs ordered are listed, but only abnormal results are displayed) Labs Reviewed  BASIC METABOLIC PANEL - Abnormal; Notable for the following components:      Result Value   Potassium 3.0 (*)    Glucose, Bld 105 (*)    All other components within normal limits  CBC - Abnormal; Notable for the following components:   RDW 21.9 (*)    All other components within normal limits  HEPATIC FUNCTION PANEL - Abnormal; Notable for the following components:   Albumin 2.9 (*)    Alkaline Phosphatase 191 (*)    Total Bilirubin 2.7 (*)    Bilirubin, Direct 1.4 (*)    Indirect Bilirubin 1.3 (*)    All other components within normal limits  BRAIN NATRIURETIC PEPTIDE - Abnormal; Notable for the following components:   B Natriuretic  Peptide 2,741.0 (*)    All other components within normal limits  TROPONIN I (HIGH SENSITIVITY) - Abnormal; Notable for the following components:   Troponin I (High Sensitivity) 36 (*)    All other components within normal limits  TROPONIN I (HIGH SENSITIVITY) - Abnormal; Notable for the following components:   Troponin I (High Sensitivity) 32 (*)    All other components within normal limits  LIPASE, BLOOD  CBC  CREATININE, SERUM  MAGNESIUM  PHOSPHORUS    EKG EKG  Interpretation  Date/Time:  Wednesday January 18 2022 11:22:57 EST Ventricular Rate:  105 PR Interval:  172 QRS Duration: 102 QT Interval:  384 QTC Calculation: 507 R Axis:   95 Text Interpretation: Sinus tachycardia Rightward axis When compared with ECG of 05-Oct-2021 08:46, No sig changes from prior tracing Confirmed by Octaviano Glow [phone]) on 01/18/2022 12:54:52 PM  Radiology US Abdomen Limited RUQ (LIVER/GB)  Result Date: 01/18/2022 CLINICAL DATA:  Acute upper abdominal pain. EXAM: ULTRASOUND ABDOMEN LIMITED RIGHT UPPER QUADRANT COMPARISON:  October 27, 2020.  September 08, 2021. FINDINGS: Gallbladder: No cholelithiasis is noted. However, mild gallbladder wall thickening is noted with small amount of pericholecystic fluid. No sonographic Murphy's sign is noted. Common bile duct: Diameter: 5 mm which is within normal limits. Liver: No focal lesion identified. Mildly increased echogenicity is noted suggesting hepatic steatosis. Portal vein is patent on color Doppler imaging with normal direction of blood flow towards the liver. Other: Mild ascites is noted. IMPRESSION: Probable hepatic steatosis. Mild ascites. Mild gallbladder wall thickening is noted without cholelithiasis; this most likely is due to surrounding ascites or adjacent hepatocellular disease. Electronically Signed   By: Marijo Conception M.D.   On: 01/18/2022 15:13   CT Chest Wo Contrast  Result Date: 01/18/2022 CLINICAL DATA:  Chest trauma. Blunt. Evaluate for  rib fracture and potential pneumothorax. EXAM: CT CHEST WITHOUT CONTRAST TECHNIQUE: Multidetector CT imaging of the chest was performed following the standard protocol without IV contrast. RADIATION DOSE REDUCTION: This exam was performed according to the departmental dose-optimization program which includes automated exposure control, adjustment of the mA and/or kV according to patient size and/or use of iterative reconstruction technique. COMPARISON:  CT chest 06/29/2018 FINDINGS: Cardiovascular: There is cardiac enlargement, similar to previous exam. Aortic atherosclerosis and coronary artery calcifications. No pericardial effusion identified. Mediastinum/Nodes: Thyroid gland, trachea, and esophagus demonstrate no significant findings. No enlarged axillary, supraclavicular, no enlarged axillary or mediastinal lymph nodes. Lungs/Pleura: Moderate volume loculated right pleural effusion is identified with fluid extending along the major fissure. This is increased in volume compared with previous exam. Compressive type atelectasis is identified within the right lower lobe. Small left pleural effusion is also identified and appears similar to the previous study. Scar like density within the lateral left lower lobe is unchanged, image 116/4. There are several perifissural nodules identified along the minor fissure of the right lung which measure up to 7 mm and most likely represent benign intrapulmonary lymph nodes. No airspace consolidation identified.  No pneumothorax identified. Upper Abdomen: There is new perisplenic and perihepatic ascites. Changes of chronic pancreatitis identified. Soft tissue stranding and edema around the head and body of pancreas is suspected. There is mild wall thickening involving the gallbladder, nonspecific in the setting of ascites and pancreatitis. There is a tubular device which connects the proximal stomach with the jejunum. Musculoskeletal: There is a remote fracture deformity  involving the lateral aspect of the left eighth rib, image 125/4. No acute fracture. Remote right anterolateral rib fracture deformities are also noted. IMPRESSION: 1. No acute rib fracture or pneumothorax identified. 2. Moderate volume loculated right pleural effusion with fluid extending along the major fissure. This is increased in volume compared with previous exam. Small left pleural effusion appears similar to the previous study. 3. Soft tissue stranding and edema around the head and body of pancreas is suspected. Correlate for any clinical signs or symptoms of acute pancreatitis. 4. New perisplenic and perihepatic ascites. 5. Mild wall thickening involving the gallbladder, nonspecific in  the setting of ascites and pancreatitis. 6. Remote bilateral rib fractures. No acute rib fracture identified. 7. There are several perifissural nodules identified along the minor fissure of the right lung which measure up to 7 mm and most likely represent benign intrapulmonary lymph nodes. 8. Aortic Atherosclerosis (ICD10-I70.0). Electronically Signed   By: Kerby Moors M.D.   On: 01/18/2022 13:54   DG Chest Port 1 View  Result Date: 01/18/2022 CLINICAL DATA:  And recent syncopal episode, initial encounter EXAM: PORTABLE CHEST 1 VIEW COMPARISON:  09/10/2021 FINDINGS: Cardiac shadow is enlarged but stable. Lungs are well aerated although increasing right-sided pleural effusion is seen with right basilar consolidation. Left lung is well aerated although a well delineated lie is noted along the lateral aspect of the left lung suspicious for focal pneumothorax. Adjacent fifth and sixth left rib fractures are noted. IMPRESSION: Increasing right-sided pleural effusion and right basilar consolidation. Findings consistent with a lateral left-sided pneumothorax with less than 1 cm excursion. Associated rib fractures are seen. CT of the chest is recommended for further evaluation. Critical Value/emergent results were called by  telephone at the time of interpretation on 01/18/2022 at 11:57 am to Dr. Langston Masker , who verbally acknowledged these results. Electronically Signed   By: Inez Catalina M.D.   On: 01/18/2022 11:59    Procedures Procedures    Medications Ordered in ED Medications  potassium chloride 10 mEq in 100 mL IVPB (10 mEq Intravenous New Bag/Given 01/18/22 1535)  heparin injection 5,000 Units (has no administration in time range)  furosemide (LASIX) injection 40 mg (has no administration in time range)  sodium chloride flush (NS) 0.9 % injection 3 mL (has no administration in time range)  sodium chloride flush (NS) 0.9 % injection 3 mL (has no administration in time range)  0.9 %  sodium chloride infusion (has no administration in time range)  acetaminophen (TYLENOL) tablet 650 mg (has no administration in time range)    Or  acetaminophen (TYLENOL) suppository 650 mg (has no administration in time range)  ondansetron (ZOFRAN) tablet 4 mg (has no administration in time range)    Or  ondansetron (ZOFRAN) injection 4 mg (has no administration in time range)  potassium chloride SA (KLOR-CON M) CR tablet 40 mEq (has no administration in time range)  albuterol (PROVENTIL) (2.5 MG/3ML) 0.083% nebulizer solution 2.5 mg (has no administration in time range)  dorzolamide-timolol (COSOPT) 2-0.5 % ophthalmic solution 1 drop (has no administration in time range)  gabapentin (NEURONTIN) capsule 300 mg (has no administration in time range)  latanoprost (XALATAN) 0.005 % ophthalmic solution 1 drop (has no administration in time range)  lipase/protease/amylase (CREON) capsule 12,000 Units (has no administration in time range)  loratadine (CLARITIN) tablet 10 mg (has no administration in time range)  midodrine (PROAMATINE) tablet 10 mg (has no administration in time range)  Netarsudil Dimesylate 0.02 % SOLN 1 drop (has no administration in time range)  pantoprazole (PROTONIX) EC tablet 40 mg (has no administration in time  range)  rosuvastatin (CRESTOR) tablet 20 mg (has no administration in time range)  traMADol (ULTRAM) tablet 100 mg (has no administration in time range)  fentaNYL (SUBLIMAZE) injection 50 mcg (50 mcg Intravenous Given 01/18/22 1321)  potassium chloride SA (KLOR-CON M) CR tablet 40 mEq (40 mEq Oral Given 01/18/22 1536)  furosemide (LASIX) injection 60 mg (60 mg Intravenous Given 01/18/22 1536)    ED Course/ Medical Decision Making/ A&P  Medical Decision Making Amount and/or Complexity of Data Reviewed Labs: ordered. Radiology: ordered.  Risk Prescription drug management. Decision regarding hospitalization.   This patient presents to the ED with concern for shortness of breath, chest pain, syncope versus near syncope. This involves an extensive number of treatment options, and is a complaint that carries with it a high risk of complications and morbidity.  The differential diagnosis includes arrhythmia versus oral effusion versus pneumonia versus pneumothorax versus congestive heart failure exacerbation versus other  Co-morbidities that complicate the patient evaluation: History of severe congestive heart failure at high risk of exacerbation  External records from outside source obtained and reviewed including most recent echocardiogram as noted above  I ordered and personally interpreted labs.  The pertinent results include: Mild hypokalemia.  Troponin mildly elevated but no significant changes  I ordered imaging studies including x-ray of the chest, CT of the chest, right upper quadrant ultrasound I independently visualized and interpreted imaging which showed moderate right-sided pleural effusion, peripancreatic inflammation, gallbladder inflammation, ascites of the abdomen.  Ultrasound of the gallbladder does not show any clear evidence of inflammation of the gallbladder. I agree with the radiologist interpretation  The patient was maintained on a cardiac  monitor.  I personally viewed and interpreted the cardiac monitored which showed an underlying rhythm of: Sinus tachycardia  Per my interpretation the patient's ECG shows sinus tachycardia with no acute ischemic findings  I ordered medication including IV Lasix for suspected pulmonary edema from CHF, IV and oral potassium for hypokalemia repletion I have reviewed the patients home medicines and have made adjustments as needed  After the interventions noted above, I reevaluated the patient and found that they have: stayed the same  Based on this work-up I have a lower suspicion for acute pancreatitis or biliary disease or intra-abdominal infection or cause of the patient's symptoms.  We will admit the patient for diuresis and for thoracentesis of pleural effusion.  I spoke to the radiologist Dr Levy Sjogren and placed a consult order, he would anticipate doing the thoracentesis tomorrow morning.  The patient was subsequently admitted to the hospitalist        Final Clinical Impression(s) / ED Diagnoses Final diagnoses:  Near syncope  Shortness of breath  Pleural effusion    Rx / DC Orders ED Discharge Orders     None         Venera Privott, Carola Rhine, MD 01/18/22 [phone]

## 2022-01-18 NOTE — ED Notes (Signed)
Attempted x 3 to call report to floor; unit RN Jerene Pitch is unavailable and will call back shortly.

## 2022-01-19 ENCOUNTER — Inpatient Hospital Stay (HOSPITAL_COMMUNITY): Payer: No Typology Code available for payment source

## 2022-01-19 DIAGNOSIS — I5023 Acute on chronic systolic (congestive) heart failure: Secondary | ICD-10-CM | POA: Diagnosis not present

## 2022-01-19 DIAGNOSIS — N1831 Chronic kidney disease, stage 3a: Secondary | ICD-10-CM

## 2022-01-19 DIAGNOSIS — N179 Acute kidney failure, unspecified: Secondary | ICD-10-CM

## 2022-01-19 DIAGNOSIS — G4733 Obstructive sleep apnea (adult) (pediatric): Secondary | ICD-10-CM | POA: Diagnosis not present

## 2022-01-19 DIAGNOSIS — R0602 Shortness of breath: Secondary | ICD-10-CM | POA: Diagnosis not present

## 2022-01-19 DIAGNOSIS — R55 Syncope and collapse: Secondary | ICD-10-CM | POA: Diagnosis not present

## 2022-01-19 LAB — BASIC METABOLIC PANEL
Anion gap: 9 (ref 5–15)
BUN: 13 mg/dL (ref 8–23)
CO2: 21 mmol/L — ABNORMAL LOW (ref 22–32)
Calcium: 8.4 mg/dL — ABNORMAL LOW (ref 8.9–10.3)
Chloride: 108 mmol/L (ref 98–111)
Creatinine, Ser: 1.41 mg/dL — ABNORMAL HIGH (ref 0.61–1.24)
GFR, Estimated: 53 mL/min — ABNORMAL LOW (ref >60)
Glucose, Bld: 114 mg/dL — ABNORMAL HIGH (ref 70–99)
Potassium: 3.2 mmol/L — ABNORMAL LOW (ref 3.5–5.1)
Sodium: 138 mmol/L (ref 135–145)

## 2022-01-19 MED ORDER — ADULT MULTIVITAMIN W/MINERALS CH
1.0000 | ORAL_TABLET | Freq: Every day | ORAL | Status: DC
  Administered 2022-01-19 – 2022-01-24 (×6): 1 via ORAL
  Filled 2022-01-19 (×6): qty 1

## 2022-01-19 MED ORDER — ENSURE MAX PROTEIN PO LIQD
11.0000 [oz_av] | Freq: Two times a day (BID) | ORAL | Status: DC
  Administered 2022-01-19 – 2022-01-23 (×6): 11 [oz_av] via ORAL

## 2022-01-19 NOTE — Progress Notes (Signed)
   Limited US Chest was performed to evaluate for Rt pleural effusion. Left chest - no effusion noted.  Very small linear sliver of fluid was noted  No safe window to perform procedure in this small effusion   NO Thoracentesis was performed Pt sent back to room

## 2022-01-19 NOTE — Progress Notes (Addendum)
Initial Nutrition Assessment  DOCUMENTATION CODES:      INTERVENTION: Ensure MAX BID daily  Nutrition Education: CHF and Pancreatitis attached to AVS  NUTRITION DIAGNOSIS:   Inadequate oral intake related to acute illness, chronic illness as evidenced by per patient/family report.   GOAL:  Patient will meet greater than or equal to 90% of their needs   MONITOR:  PO intake, Labs, Supplement acceptance, Weight trends, I & O's  REASON FOR ASSESSMENT:   Malnutrition Screening Tool    ASSESSMENT: Patient is a 71 yo male with hx of Chronic Heart failure, Hypotension, alcohol use and chronic pancreatitis. Presents with chest pain, dizziness and loss of consciousness.   [place] chest - no thoracentesis   Patient resting - no family bedside. His lunch tray is here but patient not ready to eat at this time. Short of breath with conversation. RD limited questioning due to his breathing  difficulty. Will provide low sodium diet education at follow up. Weight history reviewed.   Medications: Creon, Lasix, Protonix, Crestor.   Hypokalemia    Latest Ref Rng & Units 01/19/2022    3:45 AM 01/18/2022    4:21 PM 01/18/2022   11:55 AM  BMP  Glucose 70 - 99 mg/dL 114   105   BUN 8 - 23 mg/dL 13   11   Creatinine 0.61 - 1.24 mg/dL 1.41  1.16  1.21   Sodium 135 - 145 mmol/L 138   139   Potassium 3.5 - 5.1 mmol/L 3.2   3.0   Chloride 98 - 111 mmol/L 108   106   CO2 22 - 32 mmol/L 21   23   Calcium 8.9 - 10.3 mg/dL 8.4   8.9       NUTRITION - FOCUSED PHYSICAL EXAM: Unable to complete Nutrition-Focused physical exam at this time.     Diet Order:   Diet Order             Diet 2 gram sodium Room service appropriate? Yes; Fluid consistency: Thin  Diet effective now                   EDUCATION NEEDS:  Not appropriate for education at this time  Skin:  Skin Assessment: Reviewed RN Assessment  Last BM:  unknown  Height:   Ht Readings from Last 1 Encounters:  01/18/22 '5\' 11"'$   (1.803 m)    Weight:   Wt Readings from Last 1 Encounters:  01/19/22 76.9 kg    Ideal Body Weight:   78 kg  BMI:  Body mass index is 23.65 kg/m.  Estimated Nutritional Needs:   Kcal:  2300-2400  Protein:  98-110 gr  Fluid:  <2 liters daily   Colman Cater MS,RD,CSG,LDN Contact: Shea Evans

## 2022-01-19 NOTE — TOC Initial Note (Addendum)
Transition of Care [facility]) - Initial/Assessment Note    Patient Details  Name: Ryan [name] MRN: [id number] Date of Birth: 18-Apr-1950  [facility] CM/SW Contact:    Shade Flood, LCSW Phone Number: 01/19/2022, 12:28 PM  Clinical Narrative:                  Pt admitted from home. He has a high readmission risk score. Pt known to TOC from previous admissions. TOC spoke with pt today to assess. Pt reports that he lives with family at home. He states that he is independent in ADLs at home. Pt has a cane for ambulation. Per pt, he does not drive. His family assists with transportation to appointments and pt reports he is able to obtain his medications as needed.    Offered HH at dc though pt states he does not think he will need this. Pt is not expecting any new TOC needs for dc. TOC will follow and assist if needed.  [place] notification complete. Ref ID number is [id number].    Expected Discharge Plan: Home/Self Care Barriers to Discharge: Continued Medical Work up   Patient Goals and CMS Choice Patient states their goals for this hospitalization and ongoing recovery are:: go home      Expected Discharge Plan and Services Expected Discharge Plan: Home/Self Care In-house Referral: Clinical Social Work     Living arrangements for the past 2 months: Single Family Home                                      Prior Living Arrangements/Services Living arrangements for the past 2 months: Single Family Home Lives with:: Relatives Patient language and need for interpreter reviewed:: Yes Do you feel safe going back to the place where you live?: Yes      Need for Family Participation in Patient Care: Yes (Comment) Care giver support system in place?: Yes (comment) Current home services: DME Criminal Activity/Legal Involvement Pertinent to Current Situation/Hospitalization: No - Comment as needed  Activities of Daily Living      Permission  Sought/Granted                  Emotional Assessment   Attitude/Demeanor/Rapport: Engaged Affect (typically observed): Pleasant Orientation: : Oriented to Self, Oriented to Place, Oriented to  Time, Oriented to Situation Alcohol / Substance Use: Not Applicable Psych Involvement: No (comment)  Admission diagnosis:  Shortness of breath [R06.02] Pleural effusion [J90] Near syncope [R55] Acute on chronic systolic (congestive) heart failure (HCC) [I50.23] Patient Active Problem List   Diagnosis Date Noted   Acute on chronic systolic (congestive) heart failure ([place]) 01/18/2022   Hypokalemia 01/18/2022   Chronic pancreatitis ([place]) 01/18/2022   Chronic kidney disease, stage 3a ([place]) 09/11/2021   Orthostatic hypotension    AKI (acute kidney injury) ([place]) 08/26/2021   Acute on chronic combined systolic (congestive) and diastolic (congestive) heart failure ([place]) 08/25/2021   Pulmonary hypertension, unspecified ([place]) 08/04/2021   NSVT (nonsustained ventricular tachycardia) ([place])    Enteritis 06/28/2021   Constipation    Dilation of biliary tract    Dilation of pancreatic duct    Anemia    Pain    Pancreatic pseudocyst    Alcohol abuse    Acute on chronic pancreatitis ([place]) 10/24/2020   Chronic HFrEF (heart failure with reduced ejection fraction) ([place])    Adenomatous polyp of descending colon  Alcohol use disorder    COPD (chronic obstructive pulmonary disease) ([place]) 12/19/2013   CKD (chronic kidney disease), stage II 12/03/2013   Post-traumatic osteoarthritis of left hip 11/26/2013   Post-traumatic osteoarthritis of left knee 11/26/2013   Glaucoma 04/23/2013   Low back pain 08/29/2010   Tobacco use disorder 07/08/2010   PCP:  [place], [place]:   [id number] [place] [id number]: [phone]: [phone] [place] [address]Phone: [phone]: [phone]    Social Determinants of Health (SDOH) Interventions    Readmission Risk Interventions    01/19/2022    9:16 AM 09/12/2021   11:17 AM 09/12/2021   11:16 AM  Readmission Risk Prevention Plan  Transportation Screening Complete  Complete  PCP or Specialist Appt within 3-5 Days  Complete   [place]  Complete  Social Work Consult for RDinosaurPlanning/Counseling   Complete  Palliative Care Screening   Not Applicable  Medication Review (Press photographer Complete  Complete  [place] or [place]    SW Recovery Care/Counseling Consult Complete    [place]

## 2022-01-19 NOTE — Assessment & Plan Note (Addendum)
-  Continue nightly CPAP. -ABG demonstrating not presence of hypercapnia.

## 2022-01-19 NOTE — Progress Notes (Signed)
   01/19/22 1445  Vitals  BP 101/85  MAP (mmHg) 92  BP Location Right Arm  BP Method Automatic  Patient Position (if appropriate) Sitting  Pulse Rate (!) 101  Pulse Rate Source [place]  ECG Heart Rate (!) 102  Resp (!) 22  Level of Consciousness  Level of Consciousness Alert  MEWS COLOR  MEWS Score Color Yellow  Oxygen Therapy  SpO2 100 %  O2 Device Nasal Cannula  O2 Flow Rate (L/min) 2 L/min  MEWS Score  MEWS Temp 0  MEWS Systolic 0  MEWS Pulse 1  MEWS RR 1  MEWS LOC 0  MEWS Score 2   Informed by off-going nurse that pt was yellow MEWS due to increased SOB and tachypnea. In to assess pt, he is alert, oriented x4, eating food from his lunch tray. Pt relates that he fell asleep earlier after returning from radiology department but woke up unable to breathe, states, "I think I just quit breathing." Per previous nurse, pt's SaO2 was in high 70's to low 80's, so she started him on [place] oxygen at 3lpm which brought his SaO2 up to 100%. Pt states that he still feels SOB but it is some better since oxygen applied by prior nurse. Breath sounds clear but diminished bilaterally. Pt is tachypneic, 22-24 resp/min. SaO2 100% on currently 2 lpm [place]. Pt with dry cough, non-productive. Will update MD Dyann Kief on current pt status.

## 2022-01-19 NOTE — Progress Notes (Signed)
Progress Note   Patient: Ryan [name]:[id number] DOB: 03/25/1950 DOA: 01/18/2022     1 DOS: the patient was seen and examined on 01/19/2022   Brief hospital course:  Ryan [name] is a 71 y.o. male with medical history significant of chronic systolic heart failure with ejection fraction of 15-20%, history of COPD, chronic chest pain, orthostatic hypotension, prior history of alcohol abuse and chronic pancreatitis; who presented to the emergency department secondary to shortness of breath and complaints of near syncope/chest discomfort.   Patient reports symptom has been present for the last 3 to 4 days; his chest discomfort not exactly new present for about a week now.  Nothing makes pain better or necessarily worse.  He has denies nausea, fever, vomiting, hematuria, hematochezia, focal weaknesses, melena, headaches or any other complaints.   In the ED work-up positive for elevated BNP, vascular congestion appreciated on chest x-ray  and also worsening moderate pleural effusion.  No fever, no chills, no oxygen requirement.  Thoracentesis has been ordered, IV Lasix provided and TRH consulted to place patient in the hospital for further evaluation and management.  Assessment and Plan: * Acute on chronic systolic (congestive) heart failure (HCC) -Patient complaining of shortness of breath and orthopnea -Most recent echo with ejection fraction of 15-20% -Elevated BNP in the 2700 range appreciated at time of admission. -Continue IV diuresis -Continue low-sodium diet, daily weights and strict intake and output -follow REDS Clips measurement. -After diuresis overnight repeat limited right-sided ultrasound demonstrated a small pleural effusion with no window for thoracentesis. -Continue supportive care and follow clinical response.   Glaucoma -Continue home eyedrop regimen.  COPD (chronic obstructive pulmonary disease) (HCC) -Appears to be stable and well-controlled currently -No  wheezing on exam. -Continue home bronchodilator management. -Good saturation on room air appreciated. -Current tachypnea and short winded sensation driven by his acute on chronic systolic heart failure.  Orthostatic hypotension -Continue midodrine therapy. -Patient educated about to change positions slowly to minimize dizziness and near syncope events.  OSA (obstructive sleep apnea) -Continue nightly CPAP.  Chronic pancreatitis ([place]) -Reporting intermittent midepigastric discomfort (not new for him) -Continue the use of Creon -Low-fat diet discussed with patient and he has been encouraged for compliance with it. -continue chronic analgesic therapy.  Hypokalemia -Sinew to follow electrolytes and further replete as needed.   -Magnesium within normal limits.  Chronic kidney disease, stage 3a (HCC) -Stable and at baseline currently -Continue to follow renal function trend with diuresis. -Patient instructed to maintain adequate hydration. -Continue to minimize the use of nephrotoxic agents, contrast and avoid hypotension.   Subjective:  Reporting shortness of breath and orthopnea; patient also expressed ongoing chronic midepigastric discomfort and intermittent nausea.  No fever, no chills, no vomiting.  Physical Exam: Vitals:   01/19/22 1100 01/19/22 1300 01/19/22 1445 01/19/22 1530  BP: 106/81 91/65 101/85 98/82  Pulse: (!) 103 (!) 103 (!) 101 96  Resp: (!) 22 (!) 24 (!) 22 20  Temp: 98.1 F (36.7 C) (!) 97.5 F (36.4 C)    TempSrc: Oral Axillary    SpO2: 100% 100% 100% 100%  Weight:      Height:       General exam: Alert, awake, oriented x 3; still complaining of short winded sensation and orthopnea. Respiratory system: Positive tachypnea appreciated; no wheezing, decreased breath sounds at the bases.  Good saturation on room air. Cardiovascular system:RRR. No rubs or gallops; no JVD appreciated on exam. Gastrointestinal system: Abdomen is nondistended, soft and mild  tenderness to palpation mid epigastric area. No organomegaly or masses felt. Normal bowel sounds heard. Central nervous system: Alert and oriented. No focal neurological deficits. Extremities: No cyanosis or clubbing; trace edema bilaterally. Skin: No petechiae. Psychiatry: Judgement and insight appear normal. Mood & affect appropriate.   Data Reviewed: Basic metabolic panel: Sodium 301, potassium 3.2, chloride 108, bicarb 21, BUN 13, creatinine 1.4, GFR 53 and anion gap 9.  Family Communication: No family at bedside.  Disposition: Status is: Inpatient Remains inpatient appropriate because: Continue IV diuresis as part of treatment for his acute on chronic systolic heart failure.   Planned Discharge Destination: Home  Time spent: 35 minutes  Author: Barton Dubois, MD 01/19/2022 3:50 PM  For on call review [email or web address].

## 2022-01-20 LAB — BASIC METABOLIC PANEL
Anion gap: 9 (ref 5–15)
BUN: 16 mg/dL (ref 8–23)
CO2: 22 mmol/L (ref 22–32)
Calcium: 8.3 mg/dL — ABNORMAL LOW (ref 8.9–10.3)
Chloride: 106 mmol/L (ref 98–111)
Creatinine, Ser: 1.47 mg/dL — ABNORMAL HIGH (ref 0.61–1.24)
GFR, Estimated: 50 mL/min — ABNORMAL LOW (ref >60)
Glucose, Bld: 87 mg/dL (ref 70–99)
Potassium: 3.4 mmol/L — ABNORMAL LOW (ref 3.5–5.1)
Sodium: 137 mmol/L (ref 135–145)

## 2022-01-20 MED ORDER — MIDODRINE HCL 5 MG PO TABS
15.0000 mg | ORAL_TABLET | Freq: Three times a day (TID) | ORAL | Status: DC
  Administered 2022-01-21 – 2022-01-24 (×11): 15 mg via ORAL
  Filled 2022-01-20 (×11): qty 3

## 2022-01-20 MED ORDER — NETARSUDIL DIMESYLATE 0.02 % OP SOLN
1.0000 [drp] | Freq: Every day | OPHTHALMIC | Status: DC
  Administered 2022-01-20: 1 [drp] via OPHTHALMIC

## 2022-01-20 NOTE — Progress Notes (Signed)
Pt reported 8/10 pain in abdomen, PRN tramadol given. Vitals stable, pt slept through night. Pt removed CPAP multiple times after placement. Edema present in lower extremities, bilaterally. Expiratory wheezes present in upper lobes.

## 2022-01-20 NOTE — Progress Notes (Signed)
Progress Note   Patient: Ryan [name] UEA:[id number] DOB: 14-Nov-1950 DOA: 01/18/2022     2 DOS: the patient was seen and examined on 01/20/2022   Brief hospital course:  Ryan [name] is a 71 y.o. male with medical history significant of chronic systolic heart failure with ejection fraction of 15-20%, history of COPD, chronic chest pain, orthostatic hypotension, prior history of alcohol abuse and chronic pancreatitis; who presented to the emergency department secondary to shortness of breath and complaints of near syncope/chest discomfort.   Patient reports symptom has been present for the last 3 to 4 days; his chest discomfort not exactly new present for about a week now.  Nothing makes pain better or necessarily worse.  He has denies nausea, fever, vomiting, hematuria, hematochezia, focal weaknesses, melena, headaches or any other complaints.   In the ED work-up positive for elevated BNP, vascular congestion appreciated on chest x-ray  and also worsening moderate pleural effusion.  No fever, no chills, no oxygen requirement.  Thoracentesis has been ordered, IV Lasix provided and TRH consulted to place patient in the hospital for further evaluation and management.  Assessment and Plan: * Acute on chronic systolic (congestive) heart failure (HCC) -Patient complaining of shortness of breath and orthopnea -Most recent echo with ejection fraction of 15-20% -Elevated BNP in the 2700 range appreciated at time of admission. -Continue IV diuresis -Continue low-sodium diet, daily weights and strict intake and output -follow REDS Clips measurement. -After diuresis overnight repeat limited right-sided ultrasound demonstrated a small pleural effusion with no window for thoracentesis. -Continue supportive care and follow clinical response.   Glaucoma -Continue home eyedrop regimen.  COPD (chronic obstructive pulmonary disease) (HCC) -Appears to be stable and well-controlled currently -No  wheezing on exam. -Continue home bronchodilator management. -Good saturation on room air appreciated. -Current tachypnea and short winded sensation driven by his acute on chronic systolic heart failure.  Orthostatic hypotension -Continue midodrine therapy. -Patient educated about to change positions slowly to minimize dizziness and near syncope events. -Will adjust midodrine dose to 15 mg 3 times daily.  OSA (obstructive sleep apnea) -Continue nightly CPAP.  Chronic pancreatitis ([place]) -Reporting intermittent midepigastric discomfort (not new for him) -Continue the use of Creon -Low-fat diet discussed with patient and he has been encouraged for compliance with it. -continue chronic analgesic therapy.  Hypokalemia -Sinew to follow electrolytes and further replete as needed.   -Magnesium within normal limits.  Chronic kidney disease, stage 3a (HCC) -Stable and at baseline currently -Continue to follow renal function trend with diuresis. -Patient instructed to maintain adequate hydration. -Continue to minimize the use of nephrotoxic agents, contrast and avoid hypotension.   Subjective:  No chest pain, no nausea, no vomiting, no fever, no chills.  Intermittently continued to experience midepigastric pain which is chronic for him.  Patient is still short winded and noticing lower extremity swelling (left more than right).  Patient reports lightheaded sensation when changing positions.  Physical Exam: Vitals:   01/19/22 1959 01/19/22 2300 01/20/22 0504 01/20/22 1622  BP: 108/86  106/79 108/80  Pulse: 84 71 89 85  Resp: '20 17 20 14  '$ Temp: (!) 97.5 F (36.4 C)  97.6 F (36.4 C) 98.5 F (36.9 C)  TempSrc: Oral  Oral   SpO2: 100% 100% 100% 100%  Weight:   78.3 kg   Height:       General exam: Alert, awake, oriented x 3; no chest pain, no nausea or vomiting currently.  Reported still feeling short winded with  activity, noticing lower extremity swelling (left more than right) and  experiencing lightheadedness when changing position. Respiratory system: No using accessory muscles; intermittent tachypnea has been appreciated throughout shift/overnight observation.  Decreased breath sounds at the bases and fine crackles appreciated on exam. Cardiovascular system:RRR. No rubs or gallops. Gastrointestinal system: Abdomen is nondistended, soft and mildly tender to palpation in midepigastric area (patient reports chronic and unchanged). No organomegaly or masses felt. Normal bowel sounds heard. Central nervous system: Alert and oriented. No focal neurological deficits. Extremities: No cyanosis or clubbing; positive trace to 1+ edema bilaterally (left more than right; which is normal for him according to patient). Skin: No petechiae. Psychiatry: Judgement and insight appear normal. Mood & affect appropriate.   Data Reviewed: Basic metabolic panel: Sodium 484, potassium 3.4, chloride 106, bicarb 22, BUN 16, creatinine 1.47, GFR 50 and anion gap 9 BUN  Family Communication: No family at bedside.  Disposition: Status is: Inpatient Remains inpatient appropriate because: Continue IV diuresis as part of treatment for his acute on chronic systolic heart failure.   Planned Discharge Destination: Home  Time spent: 35 minutes  Author: Barton Dubois, MD 01/20/2022 5:05 PM  For on call review [email or web address].

## 2022-01-20 NOTE — Plan of Care (Signed)
Pt expresses self care management, ambulates to restroom with cane and one assist.

## 2022-01-21 LAB — BASIC METABOLIC PANEL
Anion gap: 10 (ref 5–15)
BUN: 17 mg/dL (ref 8–23)
CO2: 25 mmol/L (ref 22–32)
Calcium: 8.3 mg/dL — ABNORMAL LOW (ref 8.9–10.3)
Chloride: 101 mmol/L (ref 98–111)
Creatinine, Ser: 1.36 mg/dL — ABNORMAL HIGH (ref 0.61–1.24)
GFR, Estimated: 55 mL/min — ABNORMAL LOW (ref >60)
Glucose, Bld: 81 mg/dL (ref 70–99)
Potassium: 3.9 mmol/L (ref 3.5–5.1)
Sodium: 136 mmol/L (ref 135–145)

## 2022-01-21 NOTE — Progress Notes (Signed)
   01/21/22 1253  Vitals  Temp (!) 97.5 F (36.4 C)  Temp Source Oral  BP (!) 89/67  Pulse Rate 97  Resp (!) 24  MEWS COLOR  MEWS Score Color Yellow  Oxygen Therapy  SpO2 100 %  O2 Device Nasal Cannula  O2 Flow Rate (L/min) 2 L/min  MEWS Score  MEWS Temp 0  MEWS Systolic 1  MEWS Pulse 0  MEWS RR 1  MEWS LOC 0  MEWS Score 2  Provider Notification  Provider Name/Title Barton Dubois  Date Provider Notified 01/21/22  Time Provider Notified [id number]  Notification Reason Other (Comment) (Yellow MEWS)  Provider response No new orders

## 2022-01-21 NOTE — Progress Notes (Signed)
Patient was awake frequently during this shift. I checked on patient several times and he was lying in bed eating crackers and a [place] sandwich. Patient removed his CPAP several times after respiratory places it on him. Patient was not given any prn medications during this shift. Refused heparin injections and ensure supplement at 2200.

## 2022-01-21 NOTE — Progress Notes (Signed)
Progress Note   Patient: Ryan [name] KGY:[id number] DOB: 02-Jun-1950 DOA: 01/18/2022     3 DOS: the patient was seen and examined on 01/21/2022   Brief hospital course:  WILLYS SALVINO is a 71 y.o. male with medical history significant of chronic systolic heart failure with ejection fraction of 15-20%, history of COPD, chronic chest pain, orthostatic hypotension, prior history of alcohol abuse and chronic pancreatitis; who presented to the emergency department secondary to shortness of breath and complaints of near syncope/chest discomfort.   Patient reports symptom has been present for the last 3 to 4 days; his chest discomfort not exactly new present for about a week now.  Nothing makes pain better or necessarily worse.  He has denies nausea, fever, vomiting, hematuria, hematochezia, focal weaknesses, melena, headaches or any other complaints.   In the ED work-up positive for elevated BNP, vascular congestion appreciated on chest x-ray  and also worsening moderate pleural effusion.  No fever, no chills, no oxygen requirement.  Thoracentesis has been ordered, IV Lasix provided and TRH consulted to place patient in the hospital for further evaluation and management.  Assessment and Plan: * Acute on chronic systolic (congestive) heart failure (HCC) -Patient complaining of shortness of breath and orthopnea -Most recent echo with ejection fraction of 15-20% -Elevated BNP in the 2700 range appreciated at time of admission. -Continue IV diuresis -Continue low-sodium diet, daily weights and strict intake and output -follow REDS Clips measurement (currently 36). -After diuresis overnight repeat limited right-sided ultrasound demonstrated a small pleural effusion with no window for thoracentesis. -Continue supportive care and follow clinical response.   Glaucoma -Continue home eyedrop regimen.  COPD (chronic obstructive pulmonary disease) (HCC) -Appears to be stable and well-controlled  currently -No wheezing on exam. -Continue home bronchodilator management. -Good saturation on room air appreciated. -Current tachypnea and short winded sensation driven by his acute on chronic systolic heart failure.  Orthostatic hypotension -Continue midodrine therapy. -Patient educated about to change positions slowly to minimize dizziness and near syncope events. -Continue adjusted dose of midodrine (15 mg 3 times daily).  OSA (obstructive sleep apnea) -Continue nightly CPAP.  Chronic pancreatitis ([place]) -Reporting intermittent midepigastric discomfort (not new for him) -Continue the use of Creon -Low-fat diet discussed with patient and he has been encouraged for compliance with it. -continue chronic analgesic therapy.  Hypokalemia -Sinew to follow electrolytes and further replete as needed.   -Magnesium within normal limits.  Chronic kidney disease, stage 3a (HCC) -Stable and at baseline currently -Continue to follow renal function trend with diuresis. -Patient instructed to maintain adequate hydration. -Continue to minimize the use of nephrotoxic agents, contrast and avoid hypotension.   Subjective:  Patient reports feeling less dizzy and/less lightheaded when changing positions.  No chest pain, no nausea, no vomiting.  Still short winded with activity and reporting lower extremity swelling.  Physical Exam: Vitals:   01/21/22 0427 01/21/22 1253 01/21/22 1300 01/21/22 1334  BP: 100/72 (!) 89/67 (!) 88/65   Pulse: 95 97    Resp: 18 (!) 24    Temp: 98.4 F (36.9 C) (!) 97.5 F (36.4 C)    TempSrc: Oral Oral    SpO2: 98% 100%  100%  Weight: 78.7 kg     Height:       General exam: Alert, awake, oriented x 3; reporting feeling less dizzy/lightheaded when changing positions today.  No chest pain, no nausea or vomiting.  Continues to have intermittent midepigastric discomfort and expressed lower extremity swelling.  Breathing  is slowly getting better. Respiratory system:  Decreased breath sounds at the bases; fine crackles appreciated.  At time of examination 2 L nasal cannula were in place. Cardiovascular system:RRR. No murmurs, rubs, gallops.  No JVD. Gastrointestinal system: Abdomen is nondistended, soft and nontender. No organomegaly or masses felt. Normal bowel sounds heard. Central nervous system: Alert and oriented. No focal neurological deficits. Extremities: No cyanosis or clubbing; trace-1+ edema appreciated bilaterally (left more than right). Skin: No petechiae. Psychiatry: Judgement and insight appear normal. Mood & affect appropriate.   Data Reviewed: Basic metabolic panel: Sodium 379, potassium 3.9, chloride 101, bicarb 25, BUN 17 and creatinine 1.36.  Family Communication: No family at bedside.  Disposition: Status is: Inpatient Remains inpatient appropriate because: Continue IV diuresis as part of treatment for his acute on chronic systolic heart failure.   Planned Discharge Destination: Home  Time spent: 35 minutes  Author: Barton Dubois, MD 01/21/2022 2:35 PM  For on call review [email or web address].

## 2022-01-21 NOTE — Progress Notes (Signed)
   01/21/22 1051  ReDS Vest / Clip  Station Marker C  Ruler Value 34  ReDS Value Range 36 - 40  ReDS Actual Value 36  Anatomical Comments none   Obtained REDS clip reading with patient sitting upright on the chair with feet in the floor.

## 2022-01-22 LAB — BLOOD GAS, ARTERIAL
Acid-Base Excess: 4.2 mmol/L — ABNORMAL HIGH (ref 0.0–2.0)
Bicarbonate: 27.3 mmol/L (ref 20.0–28.0)
Drawn by: 22179
O2 Saturation: 98.4 %
Patient temperature: 36.8
pCO2 arterial: 35 mmHg (ref 32–48)
pH, Arterial: 7.5 — ABNORMAL HIGH (ref 7.35–7.45)
pO2, Arterial: 102 mmHg (ref 83–108)

## 2022-01-22 LAB — BASIC METABOLIC PANEL
Anion gap: 9 (ref 5–15)
BUN: 17 mg/dL (ref 8–23)
CO2: 24 mmol/L (ref 22–32)
Calcium: 8.4 mg/dL — ABNORMAL LOW (ref 8.9–10.3)
Chloride: 102 mmol/L (ref 98–111)
Creatinine, Ser: 1.21 mg/dL (ref 0.61–1.24)
GFR, Estimated: >60 mL/min (ref >60)
Glucose, Bld: 119 mg/dL — ABNORMAL HIGH (ref 70–99)
Potassium: 4.3 mmol/L (ref 3.5–5.1)
Sodium: 135 mmol/L (ref 135–145)

## 2022-01-22 MED ORDER — TRAMADOL HCL 50 MG PO TABS
100.0000 mg | ORAL_TABLET | Freq: Two times a day (BID) | ORAL | Status: DC | PRN
  Administered 2022-01-22: 100 mg via ORAL
  Filled 2022-01-22: qty 2

## 2022-01-22 MED ORDER — GABAPENTIN 300 MG PO CAPS
300.0000 mg | ORAL_CAPSULE | Freq: Two times a day (BID) | ORAL | Status: DC
  Administered 2022-01-22 – 2022-01-24 (×4): 300 mg via ORAL
  Filled 2022-01-22 (×4): qty 1

## 2022-01-22 NOTE — Progress Notes (Signed)
Progress Note   Patient: Ryan [name] HEN:[id number] DOB: 08-03-1950 DOA: 01/18/2022     4 DOS: the patient was seen and examined on 01/22/2022   Brief hospital course:  Ryan [name] is a 71 y.o. male with medical history significant of chronic systolic heart failure with ejection fraction of 15-20%, history of COPD, chronic chest pain, orthostatic hypotension, prior history of alcohol abuse and chronic pancreatitis; who presented to the emergency department secondary to shortness of breath and complaints of near syncope/chest discomfort.   Patient reports symptom has been present for the last 3 to 4 days; his chest discomfort not exactly new present for about a week now.  Nothing makes pain better or necessarily worse.  He has denies nausea, fever, vomiting, hematuria, hematochezia, focal weaknesses, melena, headaches or any other complaints.   In the ED work-up positive for elevated BNP, vascular congestion appreciated on chest x-ray  and also worsening moderate pleural effusion.  No fever, no chills, no oxygen requirement.  Thoracentesis has been ordered, IV Lasix provided and TRH consulted to place patient in the hospital for further evaluation and management.  Assessment and Plan: * Acute on chronic systolic (congestive) heart failure (HCC) -Patient complaining of shortness of breath and orthopnea -Most recent echo with ejection fraction of 15-20% -Elevated BNP in the 2700 range appreciated at time of admission. -Continue IV diuresis -Continue low-sodium diet, daily weights and strict intake and output -follow REDS Clips measurement (currently 35). -After diuresis overnight repeat limited right-sided ultrasound demonstrated a small pleural effusion with no window for thoracentesis. -Continue supportive care and follow clinical response.   Glaucoma -Continue home eyedrop regimen.  COPD (chronic obstructive pulmonary disease) (HCC) -Appears to be stable and well-controlled  currently -No wheezing on exam. -Continue home bronchodilator management. -Good saturation on room air appreciated. -Current tachypnea and short winded sensation driven by his acute on chronic systolic heart failure.  Orthostatic hypotension -Continue midodrine therapy. -Patient educated about to change positions slowly to minimize dizziness and near syncope events. -Continue adjusted dose of midodrine (15 mg 3 times daily).  OSA (obstructive sleep apnea) -Continue nightly CPAP. -ABG demonstrating no hypercapnia.  Chronic pancreatitis ([place]) -Reporting intermittent midepigastric discomfort (not new for him) -Continue the use of Creon -Low-fat diet discussed with patient and he has been encouraged for compliance with it. -continue chronic analgesic therapy.  Hypokalemia -Sinew to follow electrolytes and further replete as needed.   -Magnesium within normal limits.  Chronic kidney disease, stage 3a (HCC) -Stable and at baseline currently -Continue to follow renal function trend with diuresis. -Patient instructed to maintain adequate hydration. -Continue to minimize the use of nephrotoxic agents, contrast and avoid hypotension.   Subjective:  Patient is sleepy/somnolent; easily aroused.  Reports using CPAP throughout the night.  Breathing continues to slowly improve.  No nausea, no vomiting, no chest pain.  Physical Exam: Vitals:   01/22/22 1218 01/22/22 1421 01/22/22 1428 01/22/22 1644  BP: 95/80 106/88  99/75  Pulse: 94 (!) 103  96  Resp: 20 (!) 24  20  Temp: 98.2 F (36.8 C)     TempSrc: Oral     SpO2: 100% 100% 94% 100%  Weight:      Height:       General exam: Oriented x3 and easily aroused; very somnolent/sleepy.  No chest pain, no nausea, no vomiting.  Reports breathing is slowly improving. Respiratory system: Decreased breath sounds at the bases, no using accessory muscle.  Good saturation on 2 L supplementation.  Cardiovascular system:RRR. No rubs or  gallops. Gastrointestinal system: Abdomen is nondistended, soft and nontender. No organomegaly or masses felt. Normal bowel sounds heard. Central nervous system: Alert and oriented. No focal neurological deficits. Extremities: No cyanosis or clubbing; trace edema appreciated bilaterally. Skin: No petechiae. Psychiatry: Judgement and insight appear normal. Mood & affect appropriate.   Data Reviewed: Basic metabolic panel: Sodium 721, potassium 4.3, chloride 102, bicarb 24, BUN 17, creatinine 1.21  Family Communication: No family at bedside.  Disposition: Status is: Inpatient Remains inpatient appropriate because: Continue IV diuresis as part of treatment for his acute on chronic systolic heart failure.   Planned Discharge Destination: Home  Time spent: 35 minutes  Author: Barton Dubois, MD 01/22/2022 4:49 PM  For on call review [email or web address].

## 2022-01-22 NOTE — Progress Notes (Signed)
Patient rested  most of the night. Prn medication give once during shift. RedClips reading completed at 0600 and charted. Patient tolerated having CPAP on all night.

## 2022-01-22 NOTE — Progress Notes (Signed)
   01/22/22 1421  Vitals  BP 106/88  MAP (mmHg) 95  Pulse Rate (!) 103  Resp (!) 24  Level of Consciousness  Level of Consciousness Alert  MEWS COLOR  MEWS Score Color Yellow  Oxygen Therapy  SpO2 100 %  O2 Device Nasal Cannula  O2 Flow Rate (L/min) 2 L/min  MEWS Score  MEWS Temp 0  MEWS Systolic 0  MEWS Pulse 1  MEWS RR 1  MEWS LOC 0  MEWS Score 2  Provider Notification  Provider Name/Title Barton Dubois  Date Provider Notified 01/22/22  Time Provider Notified [phone]  Provider response No new orders   RT bedside with breathing treatment

## 2022-01-23 LAB — MRSA NEXT GEN BY PCR, NASAL: MRSA by PCR Next Gen: DETECTED — AB

## 2022-01-23 MED ORDER — TORSEMIDE 20 MG PO TABS
40.0000 mg | ORAL_TABLET | Freq: Two times a day (BID) | ORAL | Status: DC
  Administered 2022-01-24: 40 mg via ORAL
  Filled 2022-01-23: qty 2

## 2022-01-23 NOTE — Plan of Care (Signed)
Pt alert and oriented x 4. Up 1 assist to bathroom. Pt refused walker when ambulating. Used staff assistance. Pt had bm 01/22/2022 hs. Received 1 dose of tramadol. Bp was soft approx 2300. RN reassessed due to pt lying on side in bed. Pt placed on back and Bp improved. MD aware. Flagged yellow mew when respiratory switching to cpap respirations rechecked by RN was 18. Bp above 100 this am.  Problem: Education: Goal: Ability to demonstrate management of disease process will improve Outcome: Progressing Goal: Ability to verbalize understanding of medication therapies will improve Outcome: Progressing   Problem: Activity: Goal: Capacity to carry out activities will improve Outcome: Progressing   Problem: Cardiac: Goal: Ability to achieve and maintain adequate cardiopulmonary perfusion will improve Outcome: Progressing   Problem: Education: Goal: Knowledge of General Education information will improve Description: Including pain rating scale, medication(s)/side effects and non-pharmacologic comfort measures Outcome: Progressing   Problem: Health Behavior/Discharge Planning: Goal: Ability to manage health-related needs will improve Outcome: Progressing   Problem: Clinical Measurements: Goal: Ability to maintain clinical measurements within normal limits will improve Outcome: Progressing Goal: Will remain free from infection Outcome: Progressing Goal: Diagnostic test results will improve Outcome: Progressing Goal: Respiratory complications will improve Outcome: Progressing Goal: Cardiovascular complication will be avoided Outcome: Progressing   Problem: Activity: Goal: Risk for activity intolerance will decrease Outcome: Progressing   Problem: Nutrition: Goal: Adequate nutrition will be maintained Outcome: Progressing   Problem: Coping: Goal: Level of anxiety will decrease Outcome: Progressing   Problem: Elimination: Goal: Will not experience complications related to bowel  motility Outcome: Progressing Goal: Will not experience complications related to urinary retention Outcome: Progressing   Problem: Pain Managment: Goal: General experience of comfort will improve Outcome: Progressing   Problem: Safety: Goal: Ability to remain free from injury will improve Outcome: Progressing   Problem: Skin Integrity: Goal: Risk for impaired skin integrity will decrease Outcome: Progressing   Problem: Education: Goal: Knowledge of General Education information will improve Description: Including pain rating scale, medication(s)/side effects and non-pharmacologic comfort measures Outcome: Progressing   Problem: Health Behavior/Discharge Planning: Goal: Ability to manage health-related needs will improve Outcome: Progressing   Problem: Clinical Measurements: Goal: Ability to maintain clinical measurements within normal limits will improve Outcome: Progressing Goal: Will remain free from infection Outcome: Progressing Goal: Diagnostic test results will improve Outcome: Progressing Goal: Respiratory complications will improve Outcome: Progressing Goal: Cardiovascular complication will be avoided Outcome: Progressing   Problem: Activity: Goal: Risk for activity intolerance will decrease Outcome: Progressing   Problem: Nutrition: Goal: Adequate nutrition will be maintained Outcome: Progressing   Problem: Coping: Goal: Level of anxiety will decrease Outcome: Progressing   Problem: Elimination: Goal: Will not experience complications related to bowel motility Outcome: Progressing Goal: Will not experience complications related to urinary retention Outcome: Progressing   Problem: Pain Managment: Goal: General experience of comfort will improve Outcome: Progressing   Problem: Safety: Goal: Ability to remain free from injury will improve Outcome: Progressing   Problem: Skin Integrity: Goal: Risk for impaired skin integrity will  decrease Outcome: Progressing

## 2022-01-23 NOTE — Progress Notes (Signed)
Progress Note   Patient: Ryan [name] DQQ:[id number] DOB: 1951-02-03 DOA: 01/18/2022     5 DOS: the patient was seen and examined on 01/23/2022   Brief hospital course:  Ryan [name] is a 71 y.o. male with medical history significant of chronic systolic heart failure with ejection fraction of 15-20%, history of COPD, chronic chest pain, orthostatic hypotension, prior history of alcohol abuse and chronic pancreatitis; who presented to the emergency department secondary to shortness of breath and complaints of near syncope/chest discomfort.   Patient reports symptom has been present for the last 3 to 4 days; his chest discomfort not exactly new present for about a week now.  Nothing makes pain better or necessarily worse.  He has denies nausea, fever, vomiting, hematuria, hematochezia, focal weaknesses, melena, headaches or any other complaints.   In the ED work-up positive for elevated BNP, vascular congestion appreciated on chest x-ray  and also worsening moderate pleural effusion.  No fever, no chills, no oxygen requirement.  Thoracentesis has been ordered, IV Lasix provided and TRH consulted to place patient in the hospital for further evaluation and management.  Assessment and Plan: * Acute on chronic systolic (congestive) heart failure (HCC) -Patient complaining of shortness of breath and orthopnea -Most recent echo with ejection fraction of 15-20% -Elevated BNP in the 2700 range appreciated at time of admission. -Will transition diuretics to oral route -Continue low-sodium diet, daily weights and strict intake and output -follow REDS Clips measurement (currently 34-35). -After diuresis overnight repeat limited right-sided ultrasound demonstrated a small pleural effusion with no window for thoracentesis. -Continue supportive care and follow clinical response.   Glaucoma -Continue home eyedrop regimen.  COPD (chronic obstructive pulmonary disease) (HCC) -Appears to be stable and  well-controlled currently -No wheezing on exam. -Continue home bronchodilator management. -Good saturation on room air appreciated. -Current tachypnea and short winded sensation driven by his acute on chronic systolic heart failure.  Orthostatic hypotension -Continue midodrine therapy. -Patient educated about to change positions slowly to minimize dizziness and near syncope events. -Continue adjusted dose of midodrine (15 mg 3 times daily).  OSA (obstructive sleep apnea) -Continue nightly CPAP. -ABG demonstrating not presence of hypercapnia.  Chronic pancreatitis ([place]) -Reporting intermittent midepigastric discomfort (not new for him) -Continue the use of Creon -Low-fat diet discussed with patient and he has been encouraged for compliance with it. -continue chronic analgesic therapy.  Hypokalemia - Continue to follow electrolytes and further replete as needed.   -Magnesium within normal limits.  Chronic kidney disease, stage 3a (HCC) -Stable and at baseline currently -Continue to follow renal function trend with diuresis. -Patient instructed to maintain adequate hydration. -Continue to minimize the use of nephrotoxic agents, contrast and avoid hypotension.   Subjective:  Alert, awake and oriented x3; no chest pain, no nausea, no vomiting.  Reports breathing has improved significantly and he is feeling much better today.  Still slightly dizzy when changing positions.  Physical Exam: Vitals:   01/22/22 2353 01/23/22 0449 01/23/22 0700 01/23/22 1540  BP:  1'09/86 96/74 95/79 '$  Pulse:  94 100 95  Resp: 18 (!) '21 19 18  '$ Temp:  97.6 F (36.4 C) (!) 97.4 F (36.3 C)   TempSrc:  Oral Tympanic   SpO2:  100% 100% 100%  Weight:  80.3 kg    Height:       General exam: Alert, awake, oriented x 3; cooperative with examination reporting improvement in his breathing.  Slightly short winded with activity and reporting to be less  dizzy when changing positions. Respiratory system:  Improved air movement bilaterally; no frank crackles on exam; no wheezing and no using accessory muscles. Cardiovascular system:RRR. No rubs or gallops; no JVD. Gastrointestinal system: Abdomen is nondistended, soft and nontender. No organomegaly or masses felt. Normal bowel sounds heard. Central nervous system: Alert and oriented. No focal neurological deficits. Extremities: No cyanosis or clubbing; trace edema appreciated bilaterally. Skin: No petechiae. Psychiatry: Judgement and insight appear normal. Mood & affect appropriate.   Data Reviewed: Basic metabolic panel: Sodium 564, potassium 4.3, chloride 102, bicarb 24, BUN 17, creatinine 1.21  Family Communication: No family at bedside.  Disposition: Status is: Inpatient Remains inpatient appropriate because: Continue IV diuresis as part of treatment for his acute on chronic systolic heart failure.   Planned Discharge Destination: Home  Time spent: 35 minutes  Author: Barton Dubois, MD 01/23/2022 4:19 PM  For on call review [email or web address].

## 2022-01-24 DIAGNOSIS — I951 Orthostatic hypotension: Secondary | ICD-10-CM

## 2022-01-24 DIAGNOSIS — K861 Other chronic pancreatitis: Secondary | ICD-10-CM

## 2022-01-24 DIAGNOSIS — J410 Simple chronic bronchitis: Secondary | ICD-10-CM

## 2022-01-24 DIAGNOSIS — H409 Unspecified glaucoma: Secondary | ICD-10-CM

## 2022-01-24 LAB — BASIC METABOLIC PANEL
Anion gap: 10 (ref 5–15)
BUN: 21 mg/dL (ref 8–23)
CO2: 26 mmol/L (ref 22–32)
Calcium: 8.6 mg/dL — ABNORMAL LOW (ref 8.9–10.3)
Chloride: 97 mmol/L — ABNORMAL LOW (ref 98–111)
Creatinine, Ser: 1.34 mg/dL — ABNORMAL HIGH (ref 0.61–1.24)
GFR, Estimated: 56 mL/min — ABNORMAL LOW (ref >60)
Glucose, Bld: 95 mg/dL (ref 70–99)
Potassium: 4.8 mmol/L (ref 3.5–5.1)
Sodium: 133 mmol/L — ABNORMAL LOW (ref 135–145)

## 2022-01-24 LAB — MAGNESIUM: Magnesium: 2.2 mg/dL (ref 1.7–2.4)

## 2022-01-24 MED ORDER — PANTOPRAZOLE SODIUM 40 MG PO TBEC: 40.0000 mg | DELAYED_RELEASE_TABLET | Freq: Every day | ORAL | 1 refills | Status: AC

## 2022-01-24 MED ORDER — MIDODRINE HCL 5 MG PO TABS: 15.0000 mg | ORAL_TABLET | Freq: Three times a day (TID) | ORAL | 2 refills | Status: AC

## 2022-01-24 MED ORDER — TORSEMIDE 40 MG PO TABS: ORAL_TABLET | ORAL | 2 refills | Status: AC

## 2022-01-24 NOTE — Progress Notes (Signed)
   01/24/22 0804  Vitals  Pulse Rate (!) 103  Pulse Rate Source Other (Comment) (tele monitor)  Resp 20  Level of Consciousness  Level of Consciousness Alert  MEWS COLOR  MEWS Score Color Green  PCA/Epidural/Spinal Assessment  Respiratory Pattern Regular  Glasgow Coma Scale  Eye Opening 4  Best Verbal Response (NON-intubated) 5  Modified Verbal Response (INTUBATED) 5  Best Motor Response 6  Glasgow Coma Scale Score (!) 20  MEWS Score  MEWS Temp 0  MEWS Systolic 0  MEWS Pulse 1  MEWS RR 0  MEWS LOC 0  MEWS Score 1

## 2022-01-24 NOTE — Progress Notes (Signed)
Patient slept throughout this shift, denies pain or discomfort at this time.

## 2022-01-24 NOTE — Discharge Summary (Signed)
Physician Discharge Summary   Patient: Ryan [name] MRN: [id number] DOB: May 12, 1950  Admit date:     01/18/2022  Discharge date: 01/24/22  Discharge Physician: Barton Dubois   [facility]   Recommendations at discharge:  Repeat basic metabolic panel to follow electrolytes and renal function Continue to reassess patient volume status with further adjustment to diuretic regimen as needed   Discharge Diagnoses: Principal Problem:   Acute on chronic systolic (congestive) heart failure (HCC) Active Problems:   Glaucoma   COPD (chronic obstructive pulmonary disease) (HCC)   Orthostatic hypotension   Chronic kidney disease, stage 3a (HCC)   Hypokalemia   Chronic pancreatitis (HCC)   OSA (obstructive sleep apnea)  Hospital Course: [name] is a 71 y.o. male with medical history significant of chronic systolic heart failure with ejection fraction of 15-20%, history of COPD, chronic chest pain, orthostatic hypotension, prior history of alcohol abuse and chronic pancreatitis; who presented to the emergency department secondary to shortness of breath and complaints of near syncope/chest discomfort.   Patient reports symptom has been present for the last 3 to 4 days; his chest discomfort not exactly new present for about a week now.  Nothing makes pain better or necessarily worse.  He has denies nausea, fever, vomiting, hematuria, hematochezia, focal weaknesses, melena, headaches or any other complaints.   In the ED work-up positive for elevated BNP, vascular congestion appreciated on chest x-ray  and also worsening moderate pleural effusion.  No fever, no chills, no oxygen requirement.  Thoracentesis has been ordered, IV Lasix provided and TRH consulted to place patient in the hospital for further evaluation and management.  Assessment and Plan: * Acute on chronic systolic (congestive) heart failure (HCC) -Patient complaining of shortness of breath and  orthopnea -Most recent echo with ejection fraction of 15-20% -Elevated BNP in the 2700 range appreciated at time of admission. -Patient tolerated well transition to oral route diuretics; at discharge will use 60 mg of Demadex in a.m. and 40 mg in the evening. -Continue low-sodium diet, daily weights and strict intake and output -REDS Clips measurement around 32-34% at discharge. -After diuresis overnight repeat limited right-sided ultrasound demonstrated a small pleural effusion with no window for thoracentesis. -Continue supportive care and follow clinical response.   Glaucoma -Continue home eyedrop regimen.  COPD (chronic obstructive pulmonary disease) (HCC) -Appears to be stable and well-controlled currently -No wheezing on exam. -Continue home bronchodilator management. -Good saturation on room air appreciated. -Current tachypnea and short winded sensation driven by his acute on chronic systolic heart failure.   Orthostatic hypotension -Continue midodrine therapy. -Patient educated about to change positions slowly to minimize dizziness and near syncope events. -Continue adjusted dose of midodrine (15 mg 3 times daily). -Continue to follow response to adjusted dose of midodrine while reassessing orthostatic vital signs at follow-up visit.  OSA (obstructive sleep apnea) -Continue nightly CPAP. -ABG demonstrating not presence of hypercapnia.  Chronic pancreatitis ([place]) -Reporting intermittent midepigastric discomfort (not new for him) -Continue the use of Creon -Low-fat diet discussed with patient and he has been encouraged for compliance with it. -continue chronic analgesic therapy.  Hypokalemia - Continue to follow electrolytes and further replete as needed.   -Magnesium within normal limits.  Chronic kidney disease, stage 3a (HCC) -Stable and at baseline currently -Continue to follow renal function trend with diuresis. -Patient instructed to maintain adequate  hydration. -Continue to minimize the use of nephrotoxic agents, contrast and avoid hypotension.   Consultants: None Procedures  performed: See below for x-ray reports. Disposition: Home Diet recommendation: Heart healthy/low-sodium diet.  DISCHARGE MEDICATION: Allergies as of 01/24/2022       Reactions   Tape Other (See Comments)   "TAPE TEARS OFF MY SKIN"        Medication List     STOP taking these medications    ALPRAZolam 0.25 MG tablet Commonly known as: XANAX   ivabradine 5 MG Tabs tablet Commonly known as: CORLANOR       TAKE these medications    albuterol 108 (90 Base) MCG/ACT inhaler Commonly known as: VENTOLIN HFA Inhale 2 puffs into the lungs every 6 (six) hours as needed for wheezing or shortness of breath.   albuterol (2.5 MG/3ML) 0.083% nebulizer solution Commonly known as: PROVENTIL Take 2.5 mg by nebulization every 6 (six) hours as needed for wheezing or shortness of breath.   camphor-menthol lotion Commonly known as: SARNA Apply 1 Application topically daily as needed for itching.   DOCUSATE SODIUM PO Take 2 capsules by mouth daily as needed for constipation.   dorzolamide-timolol 2-0.5 % ophthalmic solution Commonly known as: COSOPT Place 1 drop into both eyes 2 (two) times daily.   ferrous sulfate 325 (65 FE) MG tablet Take 325 mg by mouth 2 (two) times daily with a meal.   gabapentin 300 MG capsule Commonly known as: NEURONTIN Take 300 mg by mouth See admin instructions. Take two capsules (600 mg) by mouth twice daily, may take a 3rd dose (600 mg) midday as needed for pain   latanoprost 0.005 % ophthalmic solution Commonly known as: XALATAN Place 1 drop into both eyes at bedtime.   lipase/protease/amylase 12000-38000 units Cpep capsule Commonly known as: CREON Take 12,000 Units by mouth 3 (three) times daily with meals. Take with a 24000 unit capsule   loratadine 10 MG tablet Commonly known as: CLARITIN Take 10 mg by mouth at  bedtime.   midodrine 5 MG tablet Commonly known as: PROAMATINE Take 3 tablets (15 mg total) by mouth 3 (three) times daily with meals. What changed: how much to take   Netarsudil Dimesylate 0.02 % Soln Place 1 drop into both eyes at bedtime.   pantoprazole 40 MG tablet Commonly known as: PROTONIX Take 1 tablet (40 mg total) by mouth daily.   potassium chloride 10 MEQ tablet Commonly known as: KLOR-CON Take 2 tablets (20 mEq total) by mouth daily.   rosuvastatin 20 MG tablet Commonly known as: CRESTOR Take 20 mg by mouth daily.   Torsemide 40 MG Tabs Take 3 tablets by mouth in the morning and 2 tablets in the evening. What changed:  medication strength how much to take how to take this when to take this additional instructions   traMADol 50 MG tablet Commonly known as: ULTRAM Take 100 mg by mouth in the morning, at noon, and at bedtime.        Follow-up [place]. Schedule an appointment as soon as possible for a visit in 10 day(s).   Contact information: [address] [place] [address] [phone]                Discharge Exam: Danley Danker Weights   01/22/22 0317 01/23/22 0449 01/24/22 0610  Weight: 78.1 kg 80.3 kg 78.9 kg    General exam: Alert, awake, oriented x 3; cooperative with examination reporting improvement in his breathing.  Slightly short winded with activity and reporting to be less dizzy when changing positions feeling ready  to go home. Respiratory system: Improved air movement bilaterally; no frank crackles on exam; no wheezing and no using accessory muscles.  Good saturation on room air appreciated. Cardiovascular system:RRR. No rubs or gallops; no JVD. Gastrointestinal system: Abdomen is nondistended, soft and nontender. No organomegaly or masses felt. Normal bowel sounds heard. Central nervous system: Alert and oriented. No focal neurological deficits. Extremities: No cyanosis or clubbing; trace edema  appreciated bilaterally (left more than right). Skin: No petechiae. Psychiatry: Judgement and insight appear normal. Mood & affect appropriate.   Condition at discharge: Stable and improved.  The results of significant diagnostics from this hospitalization (including imaging, microbiology, ancillary and laboratory) are listed below for reference.   Imaging Studies: [place] CHEST (PLEURAL EFFUSION)  Result Date: 01/19/2022 CLINICAL DATA:  Shortness of breath EXAM: CHEST ULTRASOUND COMPARISON:  CT chest 01/18/2022 FINDINGS: Small complex RIGHT pleural effusion containing debris. Pleural effusion is significantly loculated on CT and chronic Visualized pockets of accessible fluid are insufficient for thoracentesis. IMPRESSION: Insufficient loculated RIGHT pleural effusion for thoracentesis. Electronically Signed   By: Lavonia Dana M.D.   On: 01/19/2022 11:31   US Abdomen Limited RUQ (LIVER/GB)  Result Date: 01/18/2022 CLINICAL DATA:  Acute upper abdominal pain. EXAM: ULTRASOUND ABDOMEN LIMITED RIGHT UPPER QUADRANT COMPARISON:  October 27, 2020.  September 08, 2021. FINDINGS: Gallbladder: No cholelithiasis is noted. However, mild gallbladder wall thickening is noted with small amount of pericholecystic fluid. No sonographic Murphy's sign is noted. Common bile duct: Diameter: 5 mm which is within normal limits. Liver: No focal lesion identified. Mildly increased echogenicity is noted suggesting hepatic steatosis. Portal vein is patent on color Doppler imaging with normal direction of blood flow towards the liver. Other: Mild ascites is noted. IMPRESSION: Probable hepatic steatosis. Mild ascites. Mild gallbladder wall thickening is noted without cholelithiasis; this most likely is due to surrounding ascites or adjacent hepatocellular disease. Electronically Signed   By: Marijo Conception M.D.   On: 01/18/2022 15:13   CT Chest Wo Contrast  Result Date: 01/18/2022 CLINICAL DATA:  Chest trauma. Blunt. Evaluate for rib  fracture and potential pneumothorax. EXAM: CT CHEST WITHOUT CONTRAST TECHNIQUE: Multidetector CT imaging of the chest was performed following the standard protocol without IV contrast. RADIATION DOSE REDUCTION: This exam was performed according to the departmental dose-optimization program which includes automated exposure control, adjustment of the mA and/or kV according to patient size and/or use of iterative reconstruction technique. COMPARISON:  CT chest 06/29/2018 FINDINGS: Cardiovascular: There is cardiac enlargement, similar to previous exam. Aortic atherosclerosis and coronary artery calcifications. No pericardial effusion identified. Mediastinum/Nodes: Thyroid gland, trachea, and esophagus demonstrate no significant findings. No enlarged axillary, supraclavicular, no enlarged axillary or mediastinal lymph nodes. Lungs/Pleura: Moderate volume loculated right pleural effusion is identified with fluid extending along the major fissure. This is increased in volume compared with previous exam. Compressive type atelectasis is identified within the right lower lobe. Small left pleural effusion is also identified and appears similar to the previous study. Scar like density within the lateral left lower lobe is unchanged, image 116/4. There are several perifissural nodules identified along the minor fissure of the right lung which measure up to 7 mm and most likely represent benign intrapulmonary lymph nodes. No airspace consolidation identified.  No pneumothorax identified. Upper Abdomen: There is new perisplenic and perihepatic ascites. Changes of chronic pancreatitis identified. Soft tissue stranding and edema around the head and body of pancreas is suspected. There is mild wall thickening involving the gallbladder, nonspecific in the  setting of ascites and pancreatitis. There is a tubular device which connects the proximal stomach with the jejunum. Musculoskeletal: There is a remote fracture deformity involving  the lateral aspect of the left eighth rib, image 125/4. No acute fracture. Remote right anterolateral rib fracture deformities are also noted. IMPRESSION: 1. No acute rib fracture or pneumothorax identified. 2. Moderate volume loculated right pleural effusion with fluid extending along the major fissure. This is increased in volume compared with previous exam. Small left pleural effusion appears similar to the previous study. 3. Soft tissue stranding and edema around the head and body of pancreas is suspected. Correlate for any clinical signs or symptoms of acute pancreatitis. 4. New perisplenic and perihepatic ascites. 5. Mild wall thickening involving the gallbladder, nonspecific in the setting of ascites and pancreatitis. 6. Remote bilateral rib fractures. No acute rib fracture identified. 7. There are several perifissural nodules identified along the minor fissure of the right lung which measure up to 7 mm and most likely represent benign intrapulmonary lymph nodes. 8. Aortic Atherosclerosis (ICD10-I70.0). Electronically Signed   By: Kerby Moors M.D.   On: 01/18/2022 13:54   DG Chest Port 1 View  Result Date: 01/18/2022 CLINICAL DATA:  And recent syncopal episode, initial encounter EXAM: PORTABLE CHEST 1 VIEW COMPARISON:  09/10/2021 FINDINGS: Cardiac shadow is enlarged but stable. Lungs are well aerated although increasing right-sided pleural effusion is seen with right basilar consolidation. Left lung is well aerated although a well delineated lie is noted along the lateral aspect of the left lung suspicious for focal pneumothorax. Adjacent fifth and sixth left rib fractures are noted. IMPRESSION: Increasing right-sided pleural effusion and right basilar consolidation. Findings consistent with a lateral left-sided pneumothorax with less than 1 cm excursion. Associated rib fractures are seen. CT of the chest is recommended for further evaluation. Critical Value/emergent results were called by telephone at  the time of interpretation on 01/18/2022 at 11:57 am to Dr. Langston Masker , who verbally acknowledged these results. Electronically Signed   By: Inez Catalina M.D.   On: 01/18/2022 11:59    Microbiology: Results for orders placed or performed during the hospital encounter of 01/18/22  MRSA Next Gen by PCR, Nasal     Status: Abnormal   Collection Time: 01/23/22  6:25 PM   Specimen: Nasal Mucosa; Nasal Swab  Result Value Ref Range Status   MRSA by PCR Next Gen DETECTED (A) NOT DETECTED Final    Comment: RESULT CALLED TO, READ BACK BY AND VERIFIED WITH: BROWN,B ON 01/23/22 AT 1945 BY LOY,C (NOTE) The GeneXpert MRSA Assay (FDA approved for NASAL specimens only), is one component of a comprehensive MRSA colonization surveillance program. It is not intended to diagnose MRSA infection nor to guide or monitor treatment for MRSA infections. Test performance is not FDA approved in patients less than 71 years old. Performed at [facility], [address] [place], [place] [address]     Labs: CBC: Recent Labs  Lab 01/18/22 1155 01/18/22 1621  WBC 6.8 6.9  HGB 14.4 14.0  HCT 44.8 43.9  MCV 94.5 94.6  PLT 211 166   Basic Metabolic Panel: Recent Labs  Lab 01/18/22 1621 01/19/22 0345 01/20/22 0319 01/21/22 0506 01/22/22 0438 01/24/22 0326  NA  --  138 137 136 135 133*  K  --  3.2* 3.4* 3.9 4.3 4.8  CL  --  108 106 101 102 97*  CO2  --  21* '22 25 24 26  '$ GLUCOSE  --  114* 87  81 119* 95  BUN  --  '13 16 17 17 21  '$ CREATININE 1.16 1.41* 1.47* 1.36* 1.21 1.34*  CALCIUM  --  8.4* 8.3* 8.3* 8.4* 8.6*  MG 1.9  --   --   --   --  2.2  PHOS 3.2  --   --   --   --   --    Liver Function Tests: Recent Labs  Lab 01/18/22 1155  AST 33  ALT 27  ALKPHOS 191*  BILITOT 2.7*  PROT 7.5  ALBUMIN 2.9*   CBG: No results for input(s): "GLUCAP" in the last 168 hours.  Discharge time spent: greater than 30 minutes.  Signed: Barton Dubois, MD [facility] 01/24/2022

## 2022-01-29 ENCOUNTER — Emergency Department (HOSPITAL_COMMUNITY)
Admission: EM | Admit: 2022-01-29 | Discharge: 2022-01-29 | Disposition: A | Discharge status: Home / Self Care | Payer: No Typology Code available for payment source | Attending: Emergency Medicine | Admitting: Emergency Medicine

## 2022-01-29 ENCOUNTER — Emergency Department (HOSPITAL_COMMUNITY): Payer: No Typology Code available for payment source

## 2022-01-29 DIAGNOSIS — J90 Pleural effusion, not elsewhere classified: Secondary | ICD-10-CM | POA: Diagnosis not present

## 2022-01-29 DIAGNOSIS — Z7951 Long term (current) use of inhaled steroids: Secondary | ICD-10-CM | POA: Diagnosis not present

## 2022-01-29 DIAGNOSIS — R21 Rash and other nonspecific skin eruption: Secondary | ICD-10-CM | POA: Insufficient documentation

## 2022-01-29 DIAGNOSIS — J449 Chronic obstructive pulmonary disease, unspecified: Secondary | ICD-10-CM | POA: Diagnosis not present

## 2022-01-29 DIAGNOSIS — Z7952 Long term (current) use of systemic steroids: Secondary | ICD-10-CM | POA: Diagnosis not present

## 2022-01-29 DIAGNOSIS — I509 Heart failure, unspecified: Secondary | ICD-10-CM

## 2022-01-29 DIAGNOSIS — I5022 Chronic systolic (congestive) heart failure: Secondary | ICD-10-CM | POA: Insufficient documentation

## 2022-01-29 DIAGNOSIS — Z20822 Contact with and (suspected) exposure to covid-19: Secondary | ICD-10-CM | POA: Diagnosis not present

## 2022-01-29 DIAGNOSIS — R7989 Other specified abnormal findings of blood chemistry: Secondary | ICD-10-CM | POA: Diagnosis not present

## 2022-01-29 LAB — CBC WITH DIFFERENTIAL/PLATELET
Abs Immature Granulocytes: 0.03 10*3/uL (ref 0.00–0.07)
Basophils Absolute: 0.1 10*3/uL (ref 0.0–0.1)
Basophils Relative: 1 %
Eosinophils Absolute: 0.2 10*3/uL (ref 0.0–0.5)
Eosinophils Relative: 2 %
HCT: 44.1 % (ref 39.0–52.0)
Hemoglobin: 14.4 g/dL (ref 13.0–17.0)
Immature Granulocytes: 0 %
Lymphocytes Relative: 13 %
Lymphs Abs: 1.2 10*3/uL (ref 0.7–4.0)
MCH: 30.3 pg (ref 26.0–34.0)
MCHC: 32.7 g/dL (ref 30.0–36.0)
MCV: 92.6 fL (ref 80.0–100.0)
Monocytes Absolute: 0.7 10*3/uL (ref 0.1–1.0)
Monocytes Relative: 7 %
Neutro Abs: 7.1 10*3/uL (ref 1.7–7.7)
Neutrophils Relative %: 77 %
Platelets: 244 10*3/uL (ref 150–400)
RBC: 4.76 MIL/uL (ref 4.22–5.81)
RDW: 20.3 % — ABNORMAL HIGH (ref 11.5–15.5)
WBC: 9.2 10*3/uL (ref 4.0–10.5)
nRBC: 0 % (ref 0.0–0.2)

## 2022-01-29 LAB — BLOOD GAS, VENOUS
Acid-Base Excess: 3.8 mmol/L — ABNORMAL HIGH (ref 0.0–2.0)
Bicarbonate: 29.2 mmol/L — ABNORMAL HIGH (ref 20.0–28.0)
Drawn by: 7049
FIO2: 21 %
O2 Saturation: 45.5 %
Patient temperature: 36.8
pCO2, Ven: 46 mmHg (ref 44–60)
pH, Ven: 7.41 (ref 7.25–7.43)
pO2, Ven: 32 mmHg (ref 32–45)

## 2022-01-29 LAB — RESP PANEL BY RT-PCR (FLU A&B, COVID) ARPGX2
Influenza A by PCR: NEGATIVE
Influenza B by PCR: NEGATIVE
SARS Coronavirus 2 by RT PCR: NEGATIVE

## 2022-01-29 LAB — BASIC METABOLIC PANEL
Anion gap: 10 (ref 5–15)
BUN: 11 mg/dL (ref 8–23)
CO2: 24 mmol/L (ref 22–32)
Calcium: 8.5 mg/dL — ABNORMAL LOW (ref 8.9–10.3)
Chloride: 104 mmol/L (ref 98–111)
Creatinine, Ser: 0.98 mg/dL (ref 0.61–1.24)
GFR, Estimated: >60 mL/min (ref >60)
Glucose, Bld: 86 mg/dL (ref 70–99)
Potassium: 3.4 mmol/L — ABNORMAL LOW (ref 3.5–5.1)
Sodium: 138 mmol/L (ref 135–145)

## 2022-01-29 LAB — BRAIN NATRIURETIC PEPTIDE: B Natriuretic Peptide: 2940 pg/mL — ABNORMAL HIGH (ref 0.0–100.0)

## 2022-01-29 MED ORDER — HYDROXYZINE HCL 25 MG PO TABS: 25.0000 mg | ORAL_TABLET | Freq: Three times a day (TID) | ORAL | 0 refills | Status: AC | PRN

## 2022-01-29 MED ORDER — PREDNISONE 10 MG PO TABS: 40.0000 mg | ORAL_TABLET | Freq: Every day | ORAL | 0 refills | Status: AC

## 2022-01-29 MED ORDER — DIPHENHYDRAMINE HCL 25 MG PO TABS: 25.0000 mg | ORAL_TABLET | Freq: Three times a day (TID) | ORAL | 0 refills | Status: AC | PRN

## 2022-01-29 MED ORDER — METHYLPREDNISOLONE SODIUM SUCC 125 MG IJ SOLR
125.0000 mg | Freq: Once | INTRAMUSCULAR | Status: AC
  Administered 2022-01-29: 125 mg via INTRAVENOUS
  Filled 2022-01-29: qty 2

## 2022-01-29 MED ORDER — DIPHENHYDRAMINE HCL 50 MG/ML IJ SOLN
12.5000 mg | Freq: Once | INTRAMUSCULAR | Status: AC
  Administered 2022-01-29: 12.5 mg via INTRAVENOUS
  Filled 2022-01-29: qty 1

## 2022-01-29 MED ORDER — HYDROXYZINE HCL 25 MG PO TABS
50.0000 mg | ORAL_TABLET | Freq: Once | ORAL | Status: AC
  Administered 2022-01-29: 50 mg via ORAL
  Filled 2022-01-29: qty 2

## 2022-01-29 MED ORDER — FUROSEMIDE 10 MG/ML IJ SOLN
80.0000 mg | Freq: Once | INTRAMUSCULAR | Status: AC
  Administered 2022-01-29: 80 mg via INTRAVENOUS
  Filled 2022-01-29: qty 8

## 2022-01-29 NOTE — ED Notes (Signed)
ED Provider at bedside. 

## 2022-01-29 NOTE — ED Provider Notes (Signed)
[facility] EMERGENCY DEPARTMENT Provider Note   CSN: [id number] Arrival date & time: 01/29/22  1635     History  Chief Complaint  Patient presents with   Rash   Shortness of Breath         Ryan [name] is a 71 y.o. male with a history of chronic advanced systolic heart failure, ejection fraction of 15 to 20%, COPD, chronic chest pain, orthostatic hypotension, chronic pancreatitis, presenting to the ED with complaint of shortness of breath and itching rash.  The patient was just discharged from the hospital 5 days ago on November 14, per my review of external records, after admission for shortness of breath and near syncope.  He was diuresed in the hospital with improvement of his weight and respiratory status.  He was noted to have a small right-sided pleural effusion that was too small for thoracentesis.  Is also noted to have orthostatic hypotension and is on midodrine therapy for that.  He had some hypokalemia that was repleted in the hospital.  He reports that his shortness of breath is worse since returning home.  He reports compliance with all his medications.  He says he began developing an itchy rash on his left arm while he was in the hospital that is spread up and over his shoulder.  He has been scratching at it significantly.  There are some small ulcerations now where he scratched.  He lives with his wife at home and states that she does not have any similar rash.  No report of bedbugs.  He denies any known history of environmental or contact allergies or similar rash in the past.  Denies any new medications.  HPI     Home Medications Prior to Admission medications   Medication Sig Start Date End Date Taking? Authorizing Provider  diphenhydrAMINE (BENADRYL) 25 MG tablet Take 1 tablet (25 mg total) by mouth every 8 (eight) hours as needed for up to 21 doses for itching. 01/29/22  Yes Grant Henkes, Carola Rhine, MD  hydrOXYzine (ATARAX) 25 MG tablet Take 1 tablet (25 mg total) by  mouth every 8 (eight) hours as needed for up to 21 doses. 01/29/22  Yes Stone Spirito, Carola Rhine, MD  predniSONE (DELTASONE) 10 MG tablet Take 4 tablets (40 mg total) by mouth daily with breakfast for 5 days. 01/30/22 02/04/22 Yes Li Fragoso, Carola Rhine, MD  albuterol (PROVENTIL) (2.5 MG/3ML) 0.083% nebulizer solution Take 2.5 mg by nebulization every 6 (six) hours as needed for wheezing or shortness of breath.    [provider]  albuterol (VENTOLIN HFA) 108 (90 Base) MCG/ACT inhaler Inhale 2 puffs into the lungs every 6 (six) hours as needed for wheezing or shortness of breath.    [provider]  camphor-menthol Timoteo Ace) lotion Apply 1 Application topically daily as needed for itching.    [provider]  DOCUSATE SODIUM PO Take 2 capsules by mouth daily as needed for constipation.    [provider]  dorzolamide-timolol (COSOPT) 22.3-6.8 MG/ML ophthalmic solution Place 1 drop into both eyes 2 (two) times daily.    [provider]  ferrous sulfate 325 (65 FE) MG tablet Take 325 mg by mouth 2 (two) times daily with a meal.    [provider]  gabapentin (NEURONTIN) 300 MG capsule Take 300 mg by mouth See admin instructions. Take two capsules (600 mg) by mouth twice daily, may take a 3rd dose (600 mg) midday as needed for pain    [provider]  latanoprost (  XALATAN) 0.005 % ophthalmic solution Place 1 drop into both eyes at bedtime.    [provider]  lipase/protease/amylase (CREON) 12000-38000 units CPEP capsule Take 12,000 Units by mouth 3 (three) times daily with meals. Take with a 24000 unit capsule    [provider]  loratadine (CLARITIN) 10 MG tablet Take 10 mg by mouth at bedtime.    [provider]  midodrine (PROAMATINE) 5 MG tablet Take 3 tablets (15 mg total) by mouth 3 (three) times daily with meals. 01/24/22   Barton Dubois, MD  Netarsudil Dimesylate 0.02 % SOLN Place 1 drop into both eyes at bedtime.     [provider]  pantoprazole (PROTONIX) 40 MG tablet Take 1 tablet (40 mg total) by mouth daily. 01/24/22   Barton Dubois, MD  potassium chloride (KLOR-CON) 10 MEQ tablet Take 2 tablets (20 mEq total) by mouth daily. 10/05/21   [place], Maricela Bo, FNP  rosuvastatin (CRESTOR) 20 MG tablet Take 20 mg by mouth daily.    [provider]  torsemide 40 MG TABS Take 3 tablets by mouth in the morning and 2 tablets in the evening. 01/24/22   Barton Dubois, MD  traMADol (ULTRAM) 50 MG tablet Take 100 mg by mouth in the morning, at noon, and at bedtime.    [provider]      Allergies    Tape    Review of Systems   Review of Systems  Physical Exam Updated Vital Signs BP 109/81   Pulse (!) 118   Temp 98.3 F (36.8 C) (Oral)   Resp (!) 30   SpO2 96%  Physical Exam Constitutional:      General: He is not in acute distress. HENT:     Head: Normocephalic and atraumatic.  Eyes:     Conjunctiva/sclera: Conjunctivae normal.     Pupils: Pupils are equal, round, and reactive to light.  Cardiovascular:     Rate and Rhythm: Normal rate and regular rhythm.  Pulmonary:     Effort: Pulmonary effort is normal. No respiratory distress.  Abdominal:     General: There is no distension.     Tenderness: There is no abdominal tenderness.  Skin:    General: Skin is warm and dry.  Neurological:     General: No focal deficit present.     Mental Status: He is alert. Mental status is at baseline.  Psychiatric:        Mood and Affect: Mood normal.        Behavior: Behavior normal.     ED Results / Procedures / Treatments   Labs (all labs ordered are listed, but only abnormal results are displayed) Labs Reviewed  BASIC METABOLIC PANEL - Abnormal; Notable for the following components:      Result Value   Potassium 3.4 (*)    Calcium 8.5 (*)    All other components within normal limits  CBC WITH DIFFERENTIAL/PLATELET - Abnormal; Notable for the following components:    RDW 20.3 (*)    All other components within normal limits  BRAIN NATRIURETIC PEPTIDE - Abnormal; Notable for the following components:   B Natriuretic Peptide 2,940.0 (*)    All other components within normal limits  BLOOD GAS, VENOUS - Abnormal; Notable for the following components:   Bicarbonate 29.2 (*)    Acid-Base Excess 3.8 (*)    All other components within normal limits  RESP PANEL BY RT-PCR (FLU A&B, COVID) ARPGX2    EKG None  Radiology DG  Chest 2 View  Result Date: 01/29/2022 CLINICAL DATA:  Shortness of breath EXAM: CHEST - 2 VIEW COMPARISON:  Chest x-ray 01/18/2022.  Chest CT 01/18/2022. FINDINGS: Loculated small to moderate-sized right pleural effusion appears unchanged. The heart is enlarged. There is central pulmonary vascular congestion which is new from prior. There is no new focal lung infiltrate or pneumothorax. No acute fractures are seen. IMPRESSION: 1. Cardiomegaly with new central pulmonary vascular congestion. 2. Stable loculated small to moderate-sized right pleural effusion. Electronically Signed   By: Ronney Asters M.D.   On: 01/29/2022 18:35    Procedures Procedures    Medications Ordered in ED Medications  methylPREDNISolone sodium succinate (SOLU-MEDROL) 125 mg/2 mL injection 125 mg (125 mg Intravenous Given 01/29/22 1838)  hydrOXYzine (ATARAX) tablet 50 mg (50 mg Oral Given 01/29/22 1837)  diphenhydrAMINE (BENADRYL) injection 12.5 mg (12.5 mg Intravenous Given 01/29/22 1838)  furosemide (LASIX) injection 80 mg (80 mg Intravenous Given 01/29/22 1919)    ED Course/ Medical Decision Making/ A&P Clinical Course as of 01/29/22 2324  Sun Jan 29, 2022  2145 Patient is feeling much better and had multiple episodes of urination in the ED.  His respiratory status is improved as well.  He explains now that he has not been taking the torsemide because he thought he was "taken off of it" when he left the hospital.  I reviewed his discharge summary and showed this  to him and explained that it is still recommended, strongly, that he take his torsemide 60 mg in the daytime and 40 at night.  He now verbalized understanding.  His itching is also improved.  Will prescribe a few additional days of prednisone, Atarax and Benadryl, in order to prevent him from continuing scratching.  Okay for discharge [MT]    Clinical Course User Index [MT] Lenon Kuennen, Carola Rhine, MD                           Medical Decision Making Amount and/or Complexity of Data Reviewed Labs: ordered. Radiology: ordered.  Risk OTC drugs. Prescription drug management.   This patient presents to the ED with concern for shortness of breath, rash. This involves an extensive number of treatment options, and is a complaint that carries with it a high risk of complications and morbidity.  The differential diagnosis includes atopic dermatitis or rash versus other.  Shortness of breath is likely multifactorial related to his underlying CHF most likely, cannot exclude pneumonia or other complicating factor.  Co-morbidities that complicate the patient evaluation: History of significantly advanced heart failure at high risk of exacerbation  Additional history obtained EMS on arrival  External records from outside source obtained and reviewed including most recent hospital discharge summary  I ordered and personally interpreted labs.  The pertinent results include: Venous gas and basic labs at baseline level.  No acute anemia.  BNP is chronically elevated, 2900.  COVID and flu are negative  I ordered imaging studies including x-ray of the chest I independently visualized and interpreted imaging which showed chronic mild to moderate pleural effusion, some mild pulmonary edema I agree with the radiologist interpretation  The patient was maintained on a cardiac monitor.  I personally viewed and interpreted the cardiac monitored which showed an underlying rhythm of: Sinus tachycardia  I ordered  medication including Atarax, Benadryl, steroids for rash and itching.  Lasix for diuresis for some pulmonary edema  I have reviewed the patients home medicines and have made  adjustments as needed  Test Considered: Low suspicion for acute PE did not feel that CT angiogram was emergently indicated.   After the interventions noted above, I reevaluated the patient and found that they have: improved -patient is itching from rash and work of breathing significantly improved  Disposition:  After consideration of the diagnostic results and the patients response to treatment, I feel that the patent would benefit from outpatient follow up         Final Clinical Impression(s) / ED Diagnoses Final diagnoses:  Rash  Congestive heart failure, unspecified HF chronicity, unspecified heart failure type ([place])    Rx / DC Orders ED Discharge Orders          Ordered    predniSONE (DELTASONE) 10 MG tablet  Daily with breakfast        01/29/22 2148    hydrOXYzine (ATARAX) 25 MG tablet  Every 8 hours PRN        01/29/22 2148    diphenhydrAMINE (BENADRYL) 25 MG tablet  Every 8 hours PRN        01/29/22 2148              Wyvonnia Dusky, MD 01/29/22 2324

## 2022-01-29 NOTE — ED Triage Notes (Signed)
Pt presents with a c/o of a rash on trunk and extremities that started over a week ago. Pt states it was present when he was d/c from the hospital. This RN noted pt appeared with labored breathing. When asked about symptoms pt became verbally aggressive and stated "I don't know why you people mistreat me." Pt states everything has been going on for a long time and pt refused to go into details about symptoms. Note pt to be tachycardic and hypertensive. Pt taken to ED room 7 for further evaluation.

## 2022-01-29 NOTE — Discharge Instructions (Addendum)
You were given medicine in the ER today for your shortness of breath and for your itching rash.  For your shortness of breath, this is due to your congestive heart failure.  You have chronic fluid in your lungs.  It is very important you are taking your diuretic medicine at home.  This is your Demadex, or torsemide, which you will take 60 mg in the morning and 40 mg at night.  Missing doses of this medicine can cause more fluid buildup in your lungs.  Please do not miss any doses.  For your itching I gave you steroids in the ER.  You will take prednisone steroids every morning for the next 5 days.  I also prescribed you Atarax and Benadryl which you can use as needed for itching.

## 2022-02-04 ENCOUNTER — Inpatient Hospital Stay: Admit: 2022-02-04 | Payer: No Typology Code available for payment source | Admitting: Internal Medicine

## 2022-02-04 ENCOUNTER — Encounter (HOSPITAL_COMMUNITY): Payer: Self-pay

## 2022-02-04 DIAGNOSIS — R0689 Other abnormalities of breathing: Secondary | ICD-10-CM | POA: Diagnosis not present

## 2022-02-04 DIAGNOSIS — I509 Heart failure, unspecified: Secondary | ICD-10-CM | POA: Diagnosis not present

## 2022-02-04 DIAGNOSIS — I5022 Chronic systolic (congestive) heart failure: Secondary | ICD-10-CM | POA: Diagnosis not present

## 2022-02-04 DIAGNOSIS — R748 Abnormal levels of other serum enzymes: Secondary | ICD-10-CM | POA: Diagnosis not present

## 2022-02-04 DIAGNOSIS — R9431 Abnormal electrocardiogram [ECG] [EKG]: Secondary | ICD-10-CM | POA: Diagnosis not present

## 2022-02-04 DIAGNOSIS — I110 Hypertensive heart disease with heart failure: Secondary | ICD-10-CM | POA: Diagnosis not present

## 2022-02-04 DIAGNOSIS — I5043 Acute on chronic combined systolic (congestive) and diastolic (congestive) heart failure: Secondary | ICD-10-CM | POA: Diagnosis not present

## 2022-02-04 DIAGNOSIS — N200 Calculus of kidney: Secondary | ICD-10-CM | POA: Diagnosis not present

## 2022-02-04 DIAGNOSIS — R000 Tachycardia, unspecified: Secondary | ICD-10-CM | POA: Diagnosis not present

## 2022-02-04 DIAGNOSIS — I959 Hypotension, unspecified: Secondary | ICD-10-CM | POA: Diagnosis not present

## 2022-02-04 DIAGNOSIS — R21 Rash and other nonspecific skin eruption: Secondary | ICD-10-CM | POA: Diagnosis not present

## 2022-02-04 DIAGNOSIS — Z1152 Encounter for screening for COVID-19: Secondary | ICD-10-CM | POA: Diagnosis not present

## 2022-02-04 DIAGNOSIS — Z8719 Personal history of other diseases of the digestive system: Secondary | ICD-10-CM | POA: Diagnosis not present

## 2022-02-04 DIAGNOSIS — J9600 Acute respiratory failure, unspecified whether with hypoxia or hypercapnia: Secondary | ICD-10-CM | POA: Diagnosis not present

## 2022-02-04 DIAGNOSIS — R188 Other ascites: Secondary | ICD-10-CM | POA: Diagnosis not present

## 2022-02-04 DIAGNOSIS — K861 Other chronic pancreatitis: Secondary | ICD-10-CM | POA: Diagnosis not present

## 2022-02-04 DIAGNOSIS — J90 Pleural effusion, not elsewhere classified: Secondary | ICD-10-CM | POA: Diagnosis not present

## 2022-02-04 DIAGNOSIS — I700 Atherosclerosis of aorta: Secondary | ICD-10-CM | POA: Diagnosis not present

## 2022-02-04 DIAGNOSIS — I429 Cardiomyopathy, unspecified: Secondary | ICD-10-CM | POA: Diagnosis not present

## 2022-02-04 DIAGNOSIS — D72829 Elevated white blood cell count, unspecified: Secondary | ICD-10-CM | POA: Diagnosis not present

## 2022-02-04 DIAGNOSIS — Z79899 Other long term (current) drug therapy: Secondary | ICD-10-CM | POA: Diagnosis not present

## 2022-02-04 DIAGNOSIS — J9811 Atelectasis: Secondary | ICD-10-CM | POA: Diagnosis not present

## 2022-02-04 DIAGNOSIS — R791 Abnormal coagulation profile: Secondary | ICD-10-CM | POA: Diagnosis not present

## 2022-02-04 DIAGNOSIS — R109 Unspecified abdominal pain: Secondary | ICD-10-CM | POA: Diagnosis not present

## 2022-02-04 DIAGNOSIS — R079 Chest pain, unspecified: Secondary | ICD-10-CM | POA: Diagnosis not present

## 2022-02-05 DIAGNOSIS — H409 Unspecified glaucoma: Secondary | ICD-10-CM | POA: Diagnosis not present

## 2022-02-05 DIAGNOSIS — E872 Acidosis, unspecified: Secondary | ICD-10-CM | POA: Diagnosis not present

## 2022-02-05 DIAGNOSIS — R791 Abnormal coagulation profile: Secondary | ICD-10-CM | POA: Diagnosis not present

## 2022-02-05 DIAGNOSIS — G4733 Obstructive sleep apnea (adult) (pediatric): Secondary | ICD-10-CM | POA: Diagnosis not present

## 2022-02-05 DIAGNOSIS — I509 Heart failure, unspecified: Secondary | ICD-10-CM | POA: Diagnosis not present

## 2022-02-05 DIAGNOSIS — J9601 Acute respiratory failure with hypoxia: Secondary | ICD-10-CM | POA: Diagnosis not present

## 2022-02-05 DIAGNOSIS — N200 Calculus of kidney: Secondary | ICD-10-CM | POA: Diagnosis not present

## 2022-02-05 DIAGNOSIS — R748 Abnormal levels of other serum enzymes: Secondary | ICD-10-CM | POA: Diagnosis not present

## 2022-02-05 DIAGNOSIS — E876 Hypokalemia: Secondary | ICD-10-CM | POA: Diagnosis not present

## 2022-02-05 DIAGNOSIS — I110 Hypertensive heart disease with heart failure: Secondary | ICD-10-CM | POA: Diagnosis not present

## 2022-02-05 DIAGNOSIS — Z66 Do not resuscitate: Secondary | ICD-10-CM | POA: Diagnosis not present

## 2022-02-05 DIAGNOSIS — J90 Pleural effusion, not elsewhere classified: Secondary | ICD-10-CM | POA: Diagnosis not present

## 2022-02-05 DIAGNOSIS — I429 Cardiomyopathy, unspecified: Secondary | ICD-10-CM | POA: Diagnosis not present

## 2022-02-05 DIAGNOSIS — Z1152 Encounter for screening for COVID-19: Secondary | ICD-10-CM | POA: Diagnosis not present

## 2022-02-05 DIAGNOSIS — I10 Essential (primary) hypertension: Secondary | ICD-10-CM | POA: Diagnosis not present

## 2022-02-05 DIAGNOSIS — I513 Intracardiac thrombosis, not elsewhere classified: Secondary | ICD-10-CM | POA: Diagnosis not present

## 2022-02-05 DIAGNOSIS — G9341 Metabolic encephalopathy: Secondary | ICD-10-CM | POA: Diagnosis not present

## 2022-02-05 DIAGNOSIS — I5189 Other ill-defined heart diseases: Secondary | ICD-10-CM | POA: Diagnosis not present

## 2022-02-05 DIAGNOSIS — I5043 Acute on chronic combined systolic (congestive) and diastolic (congestive) heart failure: Secondary | ICD-10-CM | POA: Diagnosis not present

## 2022-02-05 DIAGNOSIS — J9811 Atelectasis: Secondary | ICD-10-CM | POA: Diagnosis not present

## 2022-02-05 DIAGNOSIS — J811 Chronic pulmonary edema: Secondary | ICD-10-CM | POA: Diagnosis not present

## 2022-02-05 DIAGNOSIS — R000 Tachycardia, unspecified: Secondary | ICD-10-CM | POA: Diagnosis not present

## 2022-02-05 DIAGNOSIS — R579 Shock, unspecified: Secondary | ICD-10-CM | POA: Diagnosis not present

## 2022-02-05 DIAGNOSIS — R918 Other nonspecific abnormal finding of lung field: Secondary | ICD-10-CM | POA: Diagnosis not present

## 2022-02-05 DIAGNOSIS — R931 Abnormal findings on diagnostic imaging of heart and coronary circulation: Secondary | ICD-10-CM | POA: Diagnosis not present

## 2022-02-05 DIAGNOSIS — A414 Sepsis due to anaerobes: Secondary | ICD-10-CM | POA: Diagnosis not present

## 2022-02-05 DIAGNOSIS — R7989 Other specified abnormal findings of blood chemistry: Secondary | ICD-10-CM | POA: Diagnosis not present

## 2022-02-05 DIAGNOSIS — R570 Cardiogenic shock: Secondary | ICD-10-CM | POA: Diagnosis not present

## 2022-02-05 DIAGNOSIS — I130 Hypertensive heart and chronic kidney disease with heart failure and stage 1 through stage 4 chronic kidney disease, or unspecified chronic kidney disease: Secondary | ICD-10-CM | POA: Diagnosis not present

## 2022-02-05 DIAGNOSIS — I5023 Acute on chronic systolic (congestive) heart failure: Secondary | ICD-10-CM | POA: Diagnosis not present

## 2022-02-05 DIAGNOSIS — E785 Hyperlipidemia, unspecified: Secondary | ICD-10-CM | POA: Diagnosis not present

## 2022-02-05 DIAGNOSIS — K828 Other specified diseases of gallbladder: Secondary | ICD-10-CM | POA: Diagnosis not present

## 2022-02-05 DIAGNOSIS — Z7189 Other specified counseling: Secondary | ICD-10-CM | POA: Diagnosis not present

## 2022-02-05 DIAGNOSIS — N1831 Chronic kidney disease, stage 3a: Secondary | ICD-10-CM | POA: Diagnosis not present

## 2022-02-05 DIAGNOSIS — Z515 Encounter for palliative care: Secondary | ICD-10-CM | POA: Diagnosis not present

## 2022-02-05 DIAGNOSIS — N281 Cyst of kidney, acquired: Secondary | ICD-10-CM | POA: Diagnosis not present

## 2022-02-05 DIAGNOSIS — J986 Disorders of diaphragm: Secondary | ICD-10-CM | POA: Diagnosis not present

## 2022-02-05 DIAGNOSIS — I083 Combined rheumatic disorders of mitral, aortic and tricuspid valves: Secondary | ICD-10-CM | POA: Diagnosis not present

## 2022-02-05 DIAGNOSIS — I427 Cardiomyopathy due to drug and external agent: Secondary | ICD-10-CM | POA: Diagnosis not present

## 2022-02-05 DIAGNOSIS — R7881 Bacteremia: Secondary | ICD-10-CM | POA: Diagnosis not present

## 2022-02-05 DIAGNOSIS — K8681 Exocrine pancreatic insufficiency: Secondary | ICD-10-CM | POA: Diagnosis not present

## 2022-02-05 DIAGNOSIS — I517 Cardiomegaly: Secondary | ICD-10-CM | POA: Diagnosis not present

## 2022-02-05 DIAGNOSIS — R9082 White matter disease, unspecified: Secondary | ICD-10-CM | POA: Diagnosis not present

## 2022-02-05 DIAGNOSIS — A4159 Other Gram-negative sepsis: Secondary | ICD-10-CM | POA: Diagnosis not present

## 2022-02-05 DIAGNOSIS — I236 Thrombosis of atrium, auricular appendage, and ventricle as current complications following acute myocardial infarction: Secondary | ICD-10-CM | POA: Diagnosis not present

## 2022-02-05 DIAGNOSIS — K861 Other chronic pancreatitis: Secondary | ICD-10-CM | POA: Diagnosis not present

## 2022-02-05 DIAGNOSIS — R9431 Abnormal electrocardiogram [ECG] [EKG]: Secondary | ICD-10-CM | POA: Diagnosis not present

## 2022-02-05 DIAGNOSIS — I951 Orthostatic hypotension: Secondary | ICD-10-CM | POA: Diagnosis not present

## 2022-02-05 DIAGNOSIS — R531 Weakness: Secondary | ICD-10-CM | POA: Diagnosis not present

## 2022-02-05 DIAGNOSIS — K838 Other specified diseases of biliary tract: Secondary | ICD-10-CM | POA: Diagnosis not present

## 2022-02-05 DIAGNOSIS — Z7901 Long term (current) use of anticoagulants: Secondary | ICD-10-CM | POA: Diagnosis not present

## 2022-02-05 DIAGNOSIS — R29818 Other symptoms and signs involving the nervous system: Secondary | ICD-10-CM | POA: Diagnosis not present

## 2022-02-05 DIAGNOSIS — J9600 Acute respiratory failure, unspecified whether with hypoxia or hypercapnia: Secondary | ICD-10-CM | POA: Diagnosis not present

## 2022-02-05 DIAGNOSIS — G8929 Other chronic pain: Secondary | ICD-10-CM | POA: Diagnosis not present

## 2022-02-05 DIAGNOSIS — I330 Acute and subacute infective endocarditis: Secondary | ICD-10-CM | POA: Diagnosis not present

## 2022-02-05 DIAGNOSIS — Z95828 Presence of other vascular implants and grafts: Secondary | ICD-10-CM | POA: Diagnosis not present

## 2022-02-05 DIAGNOSIS — I428 Other cardiomyopathies: Secondary | ICD-10-CM | POA: Diagnosis not present

## 2022-02-05 DIAGNOSIS — I088 Other rheumatic multiple valve diseases: Secondary | ICD-10-CM | POA: Diagnosis not present

## 2022-02-05 DIAGNOSIS — R188 Other ascites: Secondary | ICD-10-CM | POA: Diagnosis not present

## 2022-02-05 DIAGNOSIS — R6521 Severe sepsis with septic shock: Secondary | ICD-10-CM | POA: Diagnosis not present

## 2022-02-05 DIAGNOSIS — K5900 Constipation, unspecified: Secondary | ICD-10-CM | POA: Diagnosis not present

## 2022-02-05 DIAGNOSIS — I959 Hypotension, unspecified: Secondary | ICD-10-CM | POA: Diagnosis not present

## 2022-02-05 DIAGNOSIS — R64 Cachexia: Secondary | ICD-10-CM | POA: Diagnosis not present

## 2022-02-05 DIAGNOSIS — J449 Chronic obstructive pulmonary disease, unspecified: Secondary | ICD-10-CM | POA: Diagnosis not present

## 2022-02-05 DIAGNOSIS — S2232XA Fracture of one rib, left side, initial encounter for closed fracture: Secondary | ICD-10-CM | POA: Diagnosis not present

## 2022-02-05 DIAGNOSIS — Z720 Tobacco use: Secondary | ICD-10-CM | POA: Diagnosis not present

## 2022-02-05 DIAGNOSIS — J9611 Chronic respiratory failure with hypoxia: Secondary | ICD-10-CM | POA: Diagnosis not present

## 2022-02-05 DIAGNOSIS — I6781 Acute cerebrovascular insufficiency: Secondary | ICD-10-CM | POA: Diagnosis not present

## 2022-02-05 DIAGNOSIS — G9340 Encephalopathy, unspecified: Secondary | ICD-10-CM | POA: Diagnosis not present

## 2022-02-05 DIAGNOSIS — R079 Chest pain, unspecified: Secondary | ICD-10-CM | POA: Diagnosis not present

## 2022-02-05 DIAGNOSIS — I5020 Unspecified systolic (congestive) heart failure: Secondary | ICD-10-CM | POA: Diagnosis not present

## 2022-02-05 DIAGNOSIS — F17200 Nicotine dependence, unspecified, uncomplicated: Secondary | ICD-10-CM | POA: Diagnosis not present

## 2022-02-05 DIAGNOSIS — R0602 Shortness of breath: Secondary | ICD-10-CM | POA: Diagnosis not present

## 2022-02-05 DIAGNOSIS — K863 Pseudocyst of pancreas: Secondary | ICD-10-CM | POA: Diagnosis not present

## 2022-02-05 DIAGNOSIS — A4150 Gram-negative sepsis, unspecified: Secondary | ICD-10-CM | POA: Diagnosis not present

## 2022-02-05 DIAGNOSIS — I700 Atherosclerosis of aorta: Secondary | ICD-10-CM | POA: Diagnosis not present

## 2022-02-05 DIAGNOSIS — Z8719 Personal history of other diseases of the digestive system: Secondary | ICD-10-CM | POA: Diagnosis not present

## 2022-02-05 DIAGNOSIS — R569 Unspecified convulsions: Secondary | ICD-10-CM | POA: Diagnosis not present

## 2022-02-05 DIAGNOSIS — G319 Degenerative disease of nervous system, unspecified: Secondary | ICD-10-CM | POA: Diagnosis not present

## 2022-02-06 ENCOUNTER — Telehealth: Payer: Self-pay

## 2022-02-06 DIAGNOSIS — I509 Heart failure, unspecified: Secondary | ICD-10-CM | POA: Diagnosis not present

## 2022-02-06 DIAGNOSIS — F17200 Nicotine dependence, unspecified, uncomplicated: Secondary | ICD-10-CM | POA: Diagnosis not present

## 2022-02-06 DIAGNOSIS — N1831 Chronic kidney disease, stage 3a: Secondary | ICD-10-CM | POA: Diagnosis not present

## 2022-02-06 DIAGNOSIS — J9611 Chronic respiratory failure with hypoxia: Secondary | ICD-10-CM | POA: Diagnosis not present

## 2022-02-06 DIAGNOSIS — J449 Chronic obstructive pulmonary disease, unspecified: Secondary | ICD-10-CM | POA: Diagnosis not present

## 2022-02-06 DIAGNOSIS — R570 Cardiogenic shock: Secondary | ICD-10-CM | POA: Diagnosis not present

## 2022-02-06 DIAGNOSIS — E876 Hypokalemia: Secondary | ICD-10-CM | POA: Diagnosis not present

## 2022-02-06 DIAGNOSIS — I236 Thrombosis of atrium, auricular appendage, and ventricle as current complications following acute myocardial infarction: Secondary | ICD-10-CM | POA: Diagnosis not present

## 2022-02-06 DIAGNOSIS — I083 Combined rheumatic disorders of mitral, aortic and tricuspid valves: Secondary | ICD-10-CM | POA: Diagnosis not present

## 2022-02-06 DIAGNOSIS — I5043 Acute on chronic combined systolic (congestive) and diastolic (congestive) heart failure: Secondary | ICD-10-CM | POA: Diagnosis not present

## 2022-02-06 DIAGNOSIS — I700 Atherosclerosis of aorta: Secondary | ICD-10-CM | POA: Diagnosis not present

## 2022-02-06 DIAGNOSIS — I110 Hypertensive heart disease with heart failure: Secondary | ICD-10-CM | POA: Diagnosis not present

## 2022-02-06 DIAGNOSIS — H409 Unspecified glaucoma: Secondary | ICD-10-CM | POA: Diagnosis not present

## 2022-02-06 DIAGNOSIS — I088 Other rheumatic multiple valve diseases: Secondary | ICD-10-CM | POA: Diagnosis not present

## 2022-02-06 NOTE — Telephone Encounter (Signed)
        Patient  visited [facility] on 11/19   Telephone encounter attempt : 1st   A HIPAA compliant voice message was left requesting a return call.  Instructed patient to call back   [place] [facility] [employer]  [phone] [address], [place], [place] [address] Phone: [phone] Email: [email or web address]

## 2022-02-07 DIAGNOSIS — H409 Unspecified glaucoma: Secondary | ICD-10-CM | POA: Diagnosis not present

## 2022-02-07 DIAGNOSIS — J449 Chronic obstructive pulmonary disease, unspecified: Secondary | ICD-10-CM | POA: Diagnosis not present

## 2022-02-07 DIAGNOSIS — E876 Hypokalemia: Secondary | ICD-10-CM | POA: Diagnosis not present

## 2022-02-07 DIAGNOSIS — N1831 Chronic kidney disease, stage 3a: Secondary | ICD-10-CM | POA: Diagnosis not present

## 2022-02-07 DIAGNOSIS — I5043 Acute on chronic combined systolic (congestive) and diastolic (congestive) heart failure: Secondary | ICD-10-CM | POA: Diagnosis not present

## 2022-02-07 DIAGNOSIS — J9611 Chronic respiratory failure with hypoxia: Secondary | ICD-10-CM | POA: Diagnosis not present

## 2022-02-07 DIAGNOSIS — R570 Cardiogenic shock: Secondary | ICD-10-CM | POA: Diagnosis not present

## 2022-02-07 DIAGNOSIS — R531 Weakness: Secondary | ICD-10-CM | POA: Diagnosis not present

## 2022-02-08 DIAGNOSIS — J449 Chronic obstructive pulmonary disease, unspecified: Secondary | ICD-10-CM | POA: Diagnosis not present

## 2022-02-08 DIAGNOSIS — J9611 Chronic respiratory failure with hypoxia: Secondary | ICD-10-CM | POA: Diagnosis not present

## 2022-02-08 DIAGNOSIS — H409 Unspecified glaucoma: Secondary | ICD-10-CM | POA: Diagnosis not present

## 2022-02-08 DIAGNOSIS — E876 Hypokalemia: Secondary | ICD-10-CM | POA: Diagnosis not present

## 2022-02-08 DIAGNOSIS — I5043 Acute on chronic combined systolic (congestive) and diastolic (congestive) heart failure: Secondary | ICD-10-CM | POA: Diagnosis not present

## 2022-02-09 DIAGNOSIS — E876 Hypokalemia: Secondary | ICD-10-CM | POA: Diagnosis not present

## 2022-02-09 DIAGNOSIS — I5043 Acute on chronic combined systolic (congestive) and diastolic (congestive) heart failure: Secondary | ICD-10-CM | POA: Diagnosis not present

## 2022-02-09 DIAGNOSIS — J449 Chronic obstructive pulmonary disease, unspecified: Secondary | ICD-10-CM | POA: Diagnosis not present

## 2022-02-09 DIAGNOSIS — J9611 Chronic respiratory failure with hypoxia: Secondary | ICD-10-CM | POA: Diagnosis not present

## 2022-02-09 DIAGNOSIS — H409 Unspecified glaucoma: Secondary | ICD-10-CM | POA: Diagnosis not present

## 2022-02-10 DIAGNOSIS — I5043 Acute on chronic combined systolic (congestive) and diastolic (congestive) heart failure: Secondary | ICD-10-CM | POA: Diagnosis not present

## 2022-02-10 DIAGNOSIS — E876 Hypokalemia: Secondary | ICD-10-CM | POA: Diagnosis not present

## 2022-02-10 DIAGNOSIS — H409 Unspecified glaucoma: Secondary | ICD-10-CM | POA: Diagnosis not present

## 2022-02-10 DIAGNOSIS — J9611 Chronic respiratory failure with hypoxia: Secondary | ICD-10-CM | POA: Diagnosis not present

## 2022-02-10 DIAGNOSIS — J449 Chronic obstructive pulmonary disease, unspecified: Secondary | ICD-10-CM | POA: Diagnosis not present

## 2022-02-11 DIAGNOSIS — J449 Chronic obstructive pulmonary disease, unspecified: Secondary | ICD-10-CM | POA: Diagnosis not present

## 2022-02-11 DIAGNOSIS — H409 Unspecified glaucoma: Secondary | ICD-10-CM | POA: Diagnosis not present

## 2022-02-11 DIAGNOSIS — E876 Hypokalemia: Secondary | ICD-10-CM | POA: Diagnosis not present

## 2022-02-11 DIAGNOSIS — I5043 Acute on chronic combined systolic (congestive) and diastolic (congestive) heart failure: Secondary | ICD-10-CM | POA: Diagnosis not present

## 2022-02-11 DIAGNOSIS — J9611 Chronic respiratory failure with hypoxia: Secondary | ICD-10-CM | POA: Diagnosis not present

## 2022-02-12 DIAGNOSIS — N1831 Chronic kidney disease, stage 3a: Secondary | ICD-10-CM | POA: Diagnosis not present

## 2022-02-12 DIAGNOSIS — E876 Hypokalemia: Secondary | ICD-10-CM | POA: Diagnosis not present

## 2022-02-12 DIAGNOSIS — J449 Chronic obstructive pulmonary disease, unspecified: Secondary | ICD-10-CM | POA: Diagnosis not present

## 2022-02-12 DIAGNOSIS — R570 Cardiogenic shock: Secondary | ICD-10-CM | POA: Diagnosis not present

## 2022-02-12 DIAGNOSIS — I5043 Acute on chronic combined systolic (congestive) and diastolic (congestive) heart failure: Secondary | ICD-10-CM | POA: Diagnosis not present

## 2022-02-12 DIAGNOSIS — J9611 Chronic respiratory failure with hypoxia: Secondary | ICD-10-CM | POA: Diagnosis not present

## 2022-02-12 DIAGNOSIS — H409 Unspecified glaucoma: Secondary | ICD-10-CM | POA: Diagnosis not present

## 2022-02-13 DIAGNOSIS — J449 Chronic obstructive pulmonary disease, unspecified: Secondary | ICD-10-CM | POA: Diagnosis not present

## 2022-02-13 DIAGNOSIS — E876 Hypokalemia: Secondary | ICD-10-CM | POA: Diagnosis not present

## 2022-02-13 DIAGNOSIS — I5043 Acute on chronic combined systolic (congestive) and diastolic (congestive) heart failure: Secondary | ICD-10-CM | POA: Diagnosis not present

## 2022-02-13 DIAGNOSIS — J9611 Chronic respiratory failure with hypoxia: Secondary | ICD-10-CM | POA: Diagnosis not present

## 2022-02-13 DIAGNOSIS — H409 Unspecified glaucoma: Secondary | ICD-10-CM | POA: Diagnosis not present

## 2022-02-14 DIAGNOSIS — J9611 Chronic respiratory failure with hypoxia: Secondary | ICD-10-CM | POA: Diagnosis not present

## 2022-02-14 DIAGNOSIS — H409 Unspecified glaucoma: Secondary | ICD-10-CM | POA: Diagnosis not present

## 2022-02-14 DIAGNOSIS — R570 Cardiogenic shock: Secondary | ICD-10-CM | POA: Diagnosis not present

## 2022-02-14 DIAGNOSIS — I5043 Acute on chronic combined systolic (congestive) and diastolic (congestive) heart failure: Secondary | ICD-10-CM | POA: Diagnosis not present

## 2022-02-14 DIAGNOSIS — J449 Chronic obstructive pulmonary disease, unspecified: Secondary | ICD-10-CM | POA: Diagnosis not present

## 2022-02-14 DIAGNOSIS — N1831 Chronic kidney disease, stage 3a: Secondary | ICD-10-CM | POA: Diagnosis not present

## 2022-02-14 DIAGNOSIS — E876 Hypokalemia: Secondary | ICD-10-CM | POA: Diagnosis not present

## 2022-02-15 DIAGNOSIS — J9611 Chronic respiratory failure with hypoxia: Secondary | ICD-10-CM | POA: Diagnosis not present

## 2022-02-15 DIAGNOSIS — H409 Unspecified glaucoma: Secondary | ICD-10-CM | POA: Diagnosis not present

## 2022-02-15 DIAGNOSIS — E876 Hypokalemia: Secondary | ICD-10-CM | POA: Diagnosis not present

## 2022-02-15 DIAGNOSIS — J449 Chronic obstructive pulmonary disease, unspecified: Secondary | ICD-10-CM | POA: Diagnosis not present

## 2022-02-15 DIAGNOSIS — I5043 Acute on chronic combined systolic (congestive) and diastolic (congestive) heart failure: Secondary | ICD-10-CM | POA: Diagnosis not present

## 2022-02-16 DIAGNOSIS — E876 Hypokalemia: Secondary | ICD-10-CM | POA: Diagnosis not present

## 2022-02-16 DIAGNOSIS — H409 Unspecified glaucoma: Secondary | ICD-10-CM | POA: Diagnosis not present

## 2022-02-16 DIAGNOSIS — J449 Chronic obstructive pulmonary disease, unspecified: Secondary | ICD-10-CM | POA: Diagnosis not present

## 2022-02-16 DIAGNOSIS — I5043 Acute on chronic combined systolic (congestive) and diastolic (congestive) heart failure: Secondary | ICD-10-CM | POA: Diagnosis not present

## 2022-02-16 DIAGNOSIS — J9611 Chronic respiratory failure with hypoxia: Secondary | ICD-10-CM | POA: Diagnosis not present

## 2022-02-17 DIAGNOSIS — I5043 Acute on chronic combined systolic (congestive) and diastolic (congestive) heart failure: Secondary | ICD-10-CM | POA: Diagnosis not present

## 2022-02-18 DIAGNOSIS — I5043 Acute on chronic combined systolic (congestive) and diastolic (congestive) heart failure: Secondary | ICD-10-CM | POA: Diagnosis not present

## 2022-02-19 DIAGNOSIS — I5043 Acute on chronic combined systolic (congestive) and diastolic (congestive) heart failure: Secondary | ICD-10-CM | POA: Diagnosis not present

## 2022-02-20 DIAGNOSIS — H409 Unspecified glaucoma: Secondary | ICD-10-CM | POA: Diagnosis not present

## 2022-02-20 DIAGNOSIS — I5043 Acute on chronic combined systolic (congestive) and diastolic (congestive) heart failure: Secondary | ICD-10-CM | POA: Diagnosis not present

## 2022-02-20 DIAGNOSIS — J449 Chronic obstructive pulmonary disease, unspecified: Secondary | ICD-10-CM | POA: Diagnosis not present

## 2022-02-20 DIAGNOSIS — E876 Hypokalemia: Secondary | ICD-10-CM | POA: Diagnosis not present

## 2022-02-20 DIAGNOSIS — N1831 Chronic kidney disease, stage 3a: Secondary | ICD-10-CM | POA: Diagnosis not present

## 2022-02-21 DIAGNOSIS — Z515 Encounter for palliative care: Secondary | ICD-10-CM | POA: Diagnosis not present

## 2022-02-21 DIAGNOSIS — R579 Shock, unspecified: Secondary | ICD-10-CM | POA: Diagnosis not present

## 2022-02-21 DIAGNOSIS — N281 Cyst of kidney, acquired: Secondary | ICD-10-CM | POA: Diagnosis not present

## 2022-02-21 DIAGNOSIS — A4150 Gram-negative sepsis, unspecified: Secondary | ICD-10-CM | POA: Diagnosis not present

## 2022-02-21 DIAGNOSIS — H409 Unspecified glaucoma: Secondary | ICD-10-CM | POA: Diagnosis not present

## 2022-02-21 DIAGNOSIS — R918 Other nonspecific abnormal finding of lung field: Secondary | ICD-10-CM | POA: Diagnosis not present

## 2022-02-21 DIAGNOSIS — J90 Pleural effusion, not elsewhere classified: Secondary | ICD-10-CM | POA: Diagnosis not present

## 2022-02-21 DIAGNOSIS — R570 Cardiogenic shock: Secondary | ICD-10-CM | POA: Diagnosis not present

## 2022-02-21 DIAGNOSIS — K5900 Constipation, unspecified: Secondary | ICD-10-CM | POA: Diagnosis not present

## 2022-02-21 DIAGNOSIS — E876 Hypokalemia: Secondary | ICD-10-CM | POA: Diagnosis not present

## 2022-02-21 DIAGNOSIS — N200 Calculus of kidney: Secondary | ICD-10-CM | POA: Diagnosis not present

## 2022-02-21 DIAGNOSIS — I5043 Acute on chronic combined systolic (congestive) and diastolic (congestive) heart failure: Secondary | ICD-10-CM | POA: Diagnosis not present

## 2022-02-21 DIAGNOSIS — I513 Intracardiac thrombosis, not elsewhere classified: Secondary | ICD-10-CM | POA: Diagnosis not present

## 2022-02-21 DIAGNOSIS — J449 Chronic obstructive pulmonary disease, unspecified: Secondary | ICD-10-CM | POA: Diagnosis not present

## 2022-02-21 DIAGNOSIS — J9611 Chronic respiratory failure with hypoxia: Secondary | ICD-10-CM | POA: Diagnosis not present

## 2022-02-21 DIAGNOSIS — N1831 Chronic kidney disease, stage 3a: Secondary | ICD-10-CM | POA: Diagnosis not present

## 2022-02-21 DIAGNOSIS — R7989 Other specified abnormal findings of blood chemistry: Secondary | ICD-10-CM | POA: Diagnosis not present

## 2022-02-21 DIAGNOSIS — I517 Cardiomegaly: Secondary | ICD-10-CM | POA: Diagnosis not present

## 2022-02-21 DIAGNOSIS — R6521 Severe sepsis with septic shock: Secondary | ICD-10-CM | POA: Diagnosis not present

## 2022-02-22 DIAGNOSIS — I513 Intracardiac thrombosis, not elsewhere classified: Secondary | ICD-10-CM | POA: Diagnosis not present

## 2022-02-22 DIAGNOSIS — Z515 Encounter for palliative care: Secondary | ICD-10-CM | POA: Diagnosis not present

## 2022-02-22 DIAGNOSIS — E876 Hypokalemia: Secondary | ICD-10-CM | POA: Diagnosis not present

## 2022-02-22 DIAGNOSIS — R579 Shock, unspecified: Secondary | ICD-10-CM | POA: Diagnosis not present

## 2022-02-22 DIAGNOSIS — H409 Unspecified glaucoma: Secondary | ICD-10-CM | POA: Diagnosis not present

## 2022-02-22 DIAGNOSIS — R570 Cardiogenic shock: Secondary | ICD-10-CM | POA: Diagnosis not present

## 2022-02-22 DIAGNOSIS — J449 Chronic obstructive pulmonary disease, unspecified: Secondary | ICD-10-CM | POA: Diagnosis not present

## 2022-02-22 DIAGNOSIS — R6521 Severe sepsis with septic shock: Secondary | ICD-10-CM | POA: Diagnosis not present

## 2022-02-22 DIAGNOSIS — Z7901 Long term (current) use of anticoagulants: Secondary | ICD-10-CM | POA: Diagnosis not present

## 2022-02-22 DIAGNOSIS — A4159 Other Gram-negative sepsis: Secondary | ICD-10-CM | POA: Diagnosis not present

## 2022-02-22 DIAGNOSIS — I5043 Acute on chronic combined systolic (congestive) and diastolic (congestive) heart failure: Secondary | ICD-10-CM | POA: Diagnosis not present

## 2022-02-22 DIAGNOSIS — N1831 Chronic kidney disease, stage 3a: Secondary | ICD-10-CM | POA: Diagnosis not present

## 2022-02-23 DIAGNOSIS — R570 Cardiogenic shock: Secondary | ICD-10-CM | POA: Diagnosis not present

## 2022-02-23 DIAGNOSIS — R579 Shock, unspecified: Secondary | ICD-10-CM | POA: Diagnosis not present

## 2022-02-23 DIAGNOSIS — H409 Unspecified glaucoma: Secondary | ICD-10-CM | POA: Diagnosis not present

## 2022-02-23 DIAGNOSIS — I5043 Acute on chronic combined systolic (congestive) and diastolic (congestive) heart failure: Secondary | ICD-10-CM | POA: Diagnosis not present

## 2022-02-23 DIAGNOSIS — E876 Hypokalemia: Secondary | ICD-10-CM | POA: Diagnosis not present

## 2022-02-23 DIAGNOSIS — J449 Chronic obstructive pulmonary disease, unspecified: Secondary | ICD-10-CM | POA: Diagnosis not present

## 2022-02-23 DIAGNOSIS — Z515 Encounter for palliative care: Secondary | ICD-10-CM | POA: Diagnosis not present

## 2022-02-23 DIAGNOSIS — I513 Intracardiac thrombosis, not elsewhere classified: Secondary | ICD-10-CM | POA: Diagnosis not present

## 2022-02-23 DIAGNOSIS — N1831 Chronic kidney disease, stage 3a: Secondary | ICD-10-CM | POA: Diagnosis not present

## 2022-02-24 DIAGNOSIS — A4159 Other Gram-negative sepsis: Secondary | ICD-10-CM | POA: Diagnosis not present

## 2022-02-24 DIAGNOSIS — G319 Degenerative disease of nervous system, unspecified: Secondary | ICD-10-CM | POA: Diagnosis not present

## 2022-02-24 DIAGNOSIS — R570 Cardiogenic shock: Secondary | ICD-10-CM | POA: Diagnosis not present

## 2022-02-24 DIAGNOSIS — N1831 Chronic kidney disease, stage 3a: Secondary | ICD-10-CM | POA: Diagnosis not present

## 2022-02-24 DIAGNOSIS — R29818 Other symptoms and signs involving the nervous system: Secondary | ICD-10-CM | POA: Diagnosis not present

## 2022-02-24 DIAGNOSIS — I5043 Acute on chronic combined systolic (congestive) and diastolic (congestive) heart failure: Secondary | ICD-10-CM | POA: Diagnosis not present

## 2022-02-24 DIAGNOSIS — E876 Hypokalemia: Secondary | ICD-10-CM | POA: Diagnosis not present

## 2022-02-24 DIAGNOSIS — H409 Unspecified glaucoma: Secondary | ICD-10-CM | POA: Diagnosis not present

## 2022-02-24 DIAGNOSIS — R6521 Severe sepsis with septic shock: Secondary | ICD-10-CM | POA: Diagnosis not present

## 2022-02-24 DIAGNOSIS — Z7901 Long term (current) use of anticoagulants: Secondary | ICD-10-CM | POA: Diagnosis not present

## 2022-02-24 DIAGNOSIS — R9082 White matter disease, unspecified: Secondary | ICD-10-CM | POA: Diagnosis not present

## 2022-02-24 DIAGNOSIS — I513 Intracardiac thrombosis, not elsewhere classified: Secondary | ICD-10-CM | POA: Diagnosis not present

## 2022-02-24 DIAGNOSIS — J449 Chronic obstructive pulmonary disease, unspecified: Secondary | ICD-10-CM | POA: Diagnosis not present

## 2022-02-24 DIAGNOSIS — Z515 Encounter for palliative care: Secondary | ICD-10-CM | POA: Diagnosis not present

## 2022-02-24 DIAGNOSIS — R579 Shock, unspecified: Secondary | ICD-10-CM | POA: Diagnosis not present

## 2022-02-25 DIAGNOSIS — K828 Other specified diseases of gallbladder: Secondary | ICD-10-CM | POA: Diagnosis not present

## 2022-02-25 DIAGNOSIS — A4150 Gram-negative sepsis, unspecified: Secondary | ICD-10-CM | POA: Diagnosis not present

## 2022-02-25 DIAGNOSIS — R569 Unspecified convulsions: Secondary | ICD-10-CM | POA: Diagnosis not present

## 2022-02-25 DIAGNOSIS — G9340 Encephalopathy, unspecified: Secondary | ICD-10-CM | POA: Diagnosis not present

## 2022-02-25 DIAGNOSIS — R7881 Bacteremia: Secondary | ICD-10-CM | POA: Diagnosis not present

## 2022-02-25 DIAGNOSIS — Z515 Encounter for palliative care: Secondary | ICD-10-CM | POA: Diagnosis not present

## 2022-02-25 DIAGNOSIS — I513 Intracardiac thrombosis, not elsewhere classified: Secondary | ICD-10-CM | POA: Diagnosis not present

## 2022-02-25 DIAGNOSIS — J9611 Chronic respiratory failure with hypoxia: Secondary | ICD-10-CM | POA: Diagnosis not present

## 2022-02-25 DIAGNOSIS — I5043 Acute on chronic combined systolic (congestive) and diastolic (congestive) heart failure: Secondary | ICD-10-CM | POA: Diagnosis not present

## 2022-02-25 DIAGNOSIS — N200 Calculus of kidney: Secondary | ICD-10-CM | POA: Diagnosis not present

## 2022-02-25 DIAGNOSIS — A414 Sepsis due to anaerobes: Secondary | ICD-10-CM | POA: Diagnosis not present

## 2022-02-25 DIAGNOSIS — R6521 Severe sepsis with septic shock: Secondary | ICD-10-CM | POA: Diagnosis not present

## 2022-02-25 DIAGNOSIS — I130 Hypertensive heart and chronic kidney disease with heart failure and stage 1 through stage 4 chronic kidney disease, or unspecified chronic kidney disease: Secondary | ICD-10-CM | POA: Diagnosis not present

## 2022-02-25 DIAGNOSIS — Z7189 Other specified counseling: Secondary | ICD-10-CM | POA: Diagnosis not present

## 2022-02-26 DIAGNOSIS — I130 Hypertensive heart and chronic kidney disease with heart failure and stage 1 through stage 4 chronic kidney disease, or unspecified chronic kidney disease: Secondary | ICD-10-CM | POA: Diagnosis not present

## 2022-02-26 DIAGNOSIS — I6781 Acute cerebrovascular insufficiency: Secondary | ICD-10-CM | POA: Diagnosis not present

## 2022-02-26 DIAGNOSIS — G9340 Encephalopathy, unspecified: Secondary | ICD-10-CM | POA: Diagnosis not present

## 2022-02-26 DIAGNOSIS — R7881 Bacteremia: Secondary | ICD-10-CM | POA: Diagnosis not present

## 2022-02-26 DIAGNOSIS — A414 Sepsis due to anaerobes: Secondary | ICD-10-CM | POA: Diagnosis not present

## 2022-02-26 DIAGNOSIS — I5043 Acute on chronic combined systolic (congestive) and diastolic (congestive) heart failure: Secondary | ICD-10-CM | POA: Diagnosis not present

## 2022-02-26 DIAGNOSIS — G319 Degenerative disease of nervous system, unspecified: Secondary | ICD-10-CM | POA: Diagnosis not present

## 2022-02-26 DIAGNOSIS — I513 Intracardiac thrombosis, not elsewhere classified: Secondary | ICD-10-CM | POA: Diagnosis not present

## 2022-02-26 DIAGNOSIS — S2232XA Fracture of one rib, left side, initial encounter for closed fracture: Secondary | ICD-10-CM | POA: Diagnosis not present

## 2022-02-26 DIAGNOSIS — R6521 Severe sepsis with septic shock: Secondary | ICD-10-CM | POA: Diagnosis not present

## 2022-02-27 ENCOUNTER — Encounter (HOSPITAL_COMMUNITY): Payer: No Typology Code available for payment source

## 2022-02-27 DIAGNOSIS — A414 Sepsis due to anaerobes: Secondary | ICD-10-CM | POA: Diagnosis not present

## 2022-02-27 DIAGNOSIS — I5043 Acute on chronic combined systolic (congestive) and diastolic (congestive) heart failure: Secondary | ICD-10-CM | POA: Diagnosis not present

## 2022-02-27 DIAGNOSIS — K861 Other chronic pancreatitis: Secondary | ICD-10-CM | POA: Diagnosis not present

## 2022-02-27 DIAGNOSIS — R7881 Bacteremia: Secondary | ICD-10-CM | POA: Diagnosis not present

## 2022-02-27 DIAGNOSIS — I513 Intracardiac thrombosis, not elsewhere classified: Secondary | ICD-10-CM | POA: Diagnosis not present

## 2022-02-27 DIAGNOSIS — K828 Other specified diseases of gallbladder: Secondary | ICD-10-CM | POA: Diagnosis not present

## 2022-02-27 DIAGNOSIS — K838 Other specified diseases of biliary tract: Secondary | ICD-10-CM | POA: Diagnosis not present

## 2022-02-28 DIAGNOSIS — I513 Intracardiac thrombosis, not elsewhere classified: Secondary | ICD-10-CM | POA: Diagnosis not present

## 2022-02-28 DIAGNOSIS — R6521 Severe sepsis with septic shock: Secondary | ICD-10-CM | POA: Diagnosis not present

## 2022-02-28 DIAGNOSIS — I130 Hypertensive heart and chronic kidney disease with heart failure and stage 1 through stage 4 chronic kidney disease, or unspecified chronic kidney disease: Secondary | ICD-10-CM | POA: Diagnosis not present

## 2022-02-28 DIAGNOSIS — R7881 Bacteremia: Secondary | ICD-10-CM | POA: Diagnosis not present

## 2022-02-28 DIAGNOSIS — I5043 Acute on chronic combined systolic (congestive) and diastolic (congestive) heart failure: Secondary | ICD-10-CM | POA: Diagnosis not present

## 2022-02-28 DIAGNOSIS — A414 Sepsis due to anaerobes: Secondary | ICD-10-CM | POA: Diagnosis not present

## 2022-03-01 DIAGNOSIS — A414 Sepsis due to anaerobes: Secondary | ICD-10-CM | POA: Diagnosis not present

## 2022-03-01 DIAGNOSIS — I428 Other cardiomyopathies: Secondary | ICD-10-CM | POA: Diagnosis not present

## 2022-03-01 DIAGNOSIS — I10 Essential (primary) hypertension: Secondary | ICD-10-CM | POA: Diagnosis not present

## 2022-03-01 DIAGNOSIS — R7881 Bacteremia: Secondary | ICD-10-CM | POA: Diagnosis not present

## 2022-03-01 DIAGNOSIS — I130 Hypertensive heart and chronic kidney disease with heart failure and stage 1 through stage 4 chronic kidney disease, or unspecified chronic kidney disease: Secondary | ICD-10-CM | POA: Diagnosis not present

## 2022-03-01 DIAGNOSIS — I330 Acute and subacute infective endocarditis: Secondary | ICD-10-CM | POA: Diagnosis not present

## 2022-03-01 DIAGNOSIS — I513 Intracardiac thrombosis, not elsewhere classified: Secondary | ICD-10-CM | POA: Diagnosis not present

## 2022-03-01 DIAGNOSIS — R6521 Severe sepsis with septic shock: Secondary | ICD-10-CM | POA: Diagnosis not present

## 2022-03-01 DIAGNOSIS — R579 Shock, unspecified: Secondary | ICD-10-CM | POA: Diagnosis not present

## 2022-03-01 DIAGNOSIS — I5020 Unspecified systolic (congestive) heart failure: Secondary | ICD-10-CM | POA: Diagnosis not present

## 2022-03-01 DIAGNOSIS — Z515 Encounter for palliative care: Secondary | ICD-10-CM | POA: Diagnosis not present

## 2022-03-01 DIAGNOSIS — I5043 Acute on chronic combined systolic (congestive) and diastolic (congestive) heart failure: Secondary | ICD-10-CM | POA: Diagnosis not present

## 2022-03-13 DEATH — deceased
